# Patient Record
Sex: Female | Born: 1950
Health system: Southern US, Community
[De-identification: ages and names within clinical notes are randomized; demographics above are authoritative.]

## PROBLEM LIST (undated history)

## (undated) DIAGNOSIS — N189 Chronic kidney disease, unspecified: Secondary | ICD-10-CM

## (undated) DIAGNOSIS — G4700 Insomnia, unspecified: Secondary | ICD-10-CM

## (undated) DIAGNOSIS — I10 Essential (primary) hypertension: Secondary | ICD-10-CM

## (undated) DIAGNOSIS — G709 Myoneural disorder, unspecified: Secondary | ICD-10-CM

## (undated) DIAGNOSIS — Z8489 Family history of other specified conditions: Secondary | ICD-10-CM

## (undated) DIAGNOSIS — F419 Anxiety disorder, unspecified: Secondary | ICD-10-CM

## (undated) DIAGNOSIS — I4891 Unspecified atrial fibrillation: Secondary | ICD-10-CM

## (undated) DIAGNOSIS — IMO0001 Reserved for inherently not codable concepts without codable children: Secondary | ICD-10-CM

## (undated) DIAGNOSIS — K219 Gastro-esophageal reflux disease without esophagitis: Secondary | ICD-10-CM

## (undated) DIAGNOSIS — C50919 Malignant neoplasm of unspecified site of unspecified female breast: Secondary | ICD-10-CM

## (undated) DIAGNOSIS — E785 Hyperlipidemia, unspecified: Secondary | ICD-10-CM

## (undated) DIAGNOSIS — I499 Cardiac arrhythmia, unspecified: Secondary | ICD-10-CM

## (undated) DIAGNOSIS — R569 Unspecified convulsions: Secondary | ICD-10-CM

## (undated) DIAGNOSIS — F329 Major depressive disorder, single episode, unspecified: Secondary | ICD-10-CM

## (undated) DIAGNOSIS — Z87442 Personal history of urinary calculi: Secondary | ICD-10-CM

## (undated) DIAGNOSIS — E039 Hypothyroidism, unspecified: Secondary | ICD-10-CM

## (undated) DIAGNOSIS — I509 Heart failure, unspecified: Secondary | ICD-10-CM

## (undated) DIAGNOSIS — R51 Headache: Secondary | ICD-10-CM

## (undated) DIAGNOSIS — I2510 Atherosclerotic heart disease of native coronary artery without angina pectoris: Secondary | ICD-10-CM

## (undated) DIAGNOSIS — E538 Deficiency of other specified B group vitamins: Secondary | ICD-10-CM

## (undated) DIAGNOSIS — G2581 Restless legs syndrome: Secondary | ICD-10-CM

## (undated) DIAGNOSIS — R519 Headache, unspecified: Secondary | ICD-10-CM

## (undated) DIAGNOSIS — Z923 Personal history of irradiation: Secondary | ICD-10-CM

## (undated) DIAGNOSIS — I639 Cerebral infarction, unspecified: Secondary | ICD-10-CM

## (undated) DIAGNOSIS — M1990 Unspecified osteoarthritis, unspecified site: Secondary | ICD-10-CM

## (undated) DIAGNOSIS — F32A Depression, unspecified: Secondary | ICD-10-CM

## (undated) DIAGNOSIS — M3500 Sicca syndrome, unspecified: Secondary | ICD-10-CM

## (undated) DIAGNOSIS — J449 Chronic obstructive pulmonary disease, unspecified: Secondary | ICD-10-CM

## (undated) HISTORY — DX: Unspecified convulsions: R56.9

## (undated) HISTORY — PX: JOINT REPLACEMENT: SHX530

## (undated) HISTORY — DX: Anxiety disorder, unspecified: F41.9

## (undated) HISTORY — PX: APPENDECTOMY: SHX54

## (undated) HISTORY — PX: COLONOSCOPY: SHX174

## (undated) HISTORY — PX: BREAST LUMPECTOMY: SHX2

## (undated) HISTORY — DX: Major depressive disorder, single episode, unspecified: F32.9

## (undated) HISTORY — DX: Depression, unspecified: F32.A

## (undated) HISTORY — PX: TONSILLECTOMY: SUR1361

---

## 1995-12-05 DIAGNOSIS — E539 Vitamin B deficiency, unspecified: Secondary | ICD-10-CM | POA: Insufficient documentation

## 2010-01-30 DIAGNOSIS — Z923 Personal history of irradiation: Secondary | ICD-10-CM

## 2010-01-30 DIAGNOSIS — C50919 Malignant neoplasm of unspecified site of unspecified female breast: Secondary | ICD-10-CM

## 2010-01-30 HISTORY — PX: BREAST EXCISIONAL BIOPSY: SUR124

## 2010-01-30 HISTORY — DX: Personal history of irradiation: Z92.3

## 2010-01-30 HISTORY — DX: Malignant neoplasm of unspecified site of unspecified female breast: C50.919

## 2010-01-30 HISTORY — PX: BREAST LUMPECTOMY: SHX2

## 2010-06-29 DIAGNOSIS — IMO0002 Reserved for concepts with insufficient information to code with codable children: Secondary | ICD-10-CM | POA: Insufficient documentation

## 2010-06-29 DIAGNOSIS — E039 Hypothyroidism, unspecified: Secondary | ICD-10-CM | POA: Insufficient documentation

## 2010-09-27 DIAGNOSIS — C50911 Malignant neoplasm of unspecified site of right female breast: Secondary | ICD-10-CM

## 2010-09-27 HISTORY — DX: Malignant neoplasm of unspecified site of right female breast: C50.911

## 2010-12-14 ENCOUNTER — Ambulatory Visit: Payer: Self-pay | Admitting: Radiation Oncology

## 2010-12-21 ENCOUNTER — Ambulatory Visit: Payer: Self-pay | Admitting: Radiation Oncology

## 2010-12-31 ENCOUNTER — Ambulatory Visit: Payer: Self-pay | Admitting: Radiation Oncology

## 2011-01-31 ENCOUNTER — Ambulatory Visit: Payer: Self-pay | Admitting: Radiation Oncology

## 2011-02-02 LAB — CBC CANCER CENTER
Eosinophil %: 2.8 %
HGB: 12.8 g/dL (ref 12.0–16.0)
Lymphocyte #: 1.3 x10 3/mm (ref 1.0–3.6)
MCV: 84 fL (ref 80–100)
Monocyte %: 9 %
Neutrophil %: 66.3 %
Platelet: 278 x10 3/mm (ref 150–440)
RBC: 4.56 10*6/uL (ref 3.80–5.20)
WBC: 6 x10 3/mm (ref 3.6–11.0)

## 2011-02-09 LAB — CBC CANCER CENTER
Basophil #: 0 x10 3/mm (ref 0.0–0.1)
Eosinophil #: 0.1 x10 3/mm (ref 0.0–0.7)
HGB: 12.8 g/dL (ref 12.0–16.0)
Lymphocyte %: 16.9 %
MCH: 28.1 pg (ref 26.0–34.0)
MCV: 84 fL (ref 80–100)
Monocyte %: 7.7 %
RBC: 4.55 10*6/uL (ref 3.80–5.20)
WBC: 6.9 x10 3/mm (ref 3.6–11.0)

## 2011-03-03 ENCOUNTER — Ambulatory Visit: Payer: Self-pay | Admitting: Radiation Oncology

## 2011-05-01 ENCOUNTER — Ambulatory Visit: Payer: Self-pay | Admitting: Radiation Oncology

## 2011-05-31 ENCOUNTER — Ambulatory Visit: Payer: Self-pay | Admitting: Radiation Oncology

## 2011-10-03 ENCOUNTER — Ambulatory Visit: Payer: Self-pay | Admitting: Radiation Oncology

## 2011-10-04 ENCOUNTER — Ambulatory Visit: Payer: Self-pay | Admitting: Radiation Oncology

## 2011-10-31 ENCOUNTER — Ambulatory Visit: Payer: Self-pay | Admitting: Radiation Oncology

## 2011-12-01 ENCOUNTER — Ambulatory Visit: Payer: Self-pay | Admitting: Radiation Oncology

## 2012-01-22 ENCOUNTER — Inpatient Hospital Stay: Payer: Self-pay | Admitting: Internal Medicine

## 2012-01-22 LAB — COMPREHENSIVE METABOLIC PANEL
BUN: 12 mg/dL (ref 7–18)
Bilirubin,Total: 0.4 mg/dL (ref 0.2–1.0)
Chloride: 111 mmol/L — ABNORMAL HIGH (ref 98–107)
Co2: 23 mmol/L (ref 21–32)
EGFR (African American): >60
EGFR (Non-African Amer.): >60
Glucose: 110 mg/dL — ABNORMAL HIGH (ref 65–99)
Osmolality: 285 (ref 275–301)
Potassium: 4 mmol/L (ref 3.5–5.1)
Sodium: 143 mmol/L (ref 136–145)
Total Protein: 8.3 g/dL — ABNORMAL HIGH (ref 6.4–8.2)

## 2012-01-22 LAB — DRUG SCREEN, URINE
Amphetamines, Ur Screen: NEGATIVE (ref ?–1000)
Barbiturates, Ur Screen: NEGATIVE (ref ?–200)
Benzodiazepine, Ur Scrn: NEGATIVE (ref ?–200)
MDMA (Ecstasy)Ur Screen: NEGATIVE (ref ?–500)
Tricyclic, Ur Screen: NEGATIVE (ref ?–1000)

## 2012-01-22 LAB — CBC
HGB: 12.5 g/dL (ref 12.0–16.0)
MCH: 28.5 pg (ref 26.0–34.0)
MCHC: 33.2 g/dL (ref 32.0–36.0)
MCV: 86 fL (ref 80–100)
Platelet: 309 10*3/uL (ref 150–440)
RBC: 4.38 10*6/uL (ref 3.80–5.20)

## 2012-01-22 LAB — URINALYSIS, COMPLETE
Bacteria: NONE SEEN
Blood: NEGATIVE
Glucose,UR: NEGATIVE mg/dL (ref 0–75)
Hyaline Cast: 71
Ketone: NEGATIVE
Leukocyte Esterase: NEGATIVE
Nitrite: NEGATIVE
RBC,UR: <1 /HPF (ref 0–5)
Specific Gravity: 1.031 (ref 1.003–1.030)
Squamous Epithelial: <1
WBC UR: 1 /HPF (ref 0–5)

## 2012-01-22 LAB — ETHANOL
Ethanol %: <0.003 % (ref 0.000–0.080)
Ethanol: <3 mg/dL

## 2012-01-22 LAB — ACETAMINOPHEN LEVEL
Acetaminophen: 53 ug/mL — ABNORMAL HIGH
Acetaminophen: 52 ug/mL — ABNORMAL HIGH

## 2012-01-22 LAB — TSH: Thyroid Stimulating Horm: 2.23 u[IU]/mL

## 2012-01-24 ENCOUNTER — Inpatient Hospital Stay: Payer: Self-pay | Admitting: Psychiatry

## 2012-01-24 LAB — ACETAMINOPHEN LEVEL: Acetaminophen: <2 ug/mL

## 2012-04-02 ENCOUNTER — Ambulatory Visit: Payer: Self-pay | Admitting: Internal Medicine

## 2012-05-21 ENCOUNTER — Ambulatory Visit: Payer: Self-pay | Admitting: Internal Medicine

## 2012-07-01 ENCOUNTER — Ambulatory Visit: Payer: Self-pay | Admitting: Internal Medicine

## 2012-07-01 LAB — HEPATIC FUNCTION PANEL A (ARMC)
Albumin: 3.3 g/dL — ABNORMAL LOW (ref 3.4–5.0)
Bilirubin, Direct: 0.1 mg/dL (ref 0.00–0.20)
Bilirubin,Total: 0.3 mg/dL (ref 0.2–1.0)
SGOT(AST): 18 U/L (ref 15–37)
SGPT (ALT): 25 U/L (ref 12–78)
Total Protein: 7.7 g/dL (ref 6.4–8.2)

## 2012-07-01 LAB — CBC CANCER CENTER
Basophil #: 0.1 x10 3/mm (ref 0.0–0.1)
Basophil %: 1.1 %
HCT: 36.7 % (ref 35.0–47.0)
HGB: 12.5 g/dL (ref 12.0–16.0)
Lymphocyte #: 1.5 x10 3/mm (ref 1.0–3.6)
Lymphocyte %: 21.8 %
MCH: 29.6 pg (ref 26.0–34.0)
MCHC: 34 g/dL (ref 32.0–36.0)
MCV: 87 fL (ref 80–100)
Monocyte #: 0.4 x10 3/mm (ref 0.2–0.9)
Neutrophil %: 68 %
WBC: 6.9 x10 3/mm (ref 3.6–11.0)

## 2012-07-01 LAB — CREATININE, SERUM
EGFR (African American): >60
EGFR (Non-African Amer.): >60

## 2012-07-30 ENCOUNTER — Ambulatory Visit: Payer: Self-pay | Admitting: Internal Medicine

## 2012-12-11 ENCOUNTER — Ambulatory Visit: Payer: Self-pay | Admitting: Family Medicine

## 2012-12-31 ENCOUNTER — Ambulatory Visit: Payer: Self-pay | Admitting: Gastroenterology

## 2013-01-01 LAB — PATHOLOGY REPORT

## 2013-05-01 ENCOUNTER — Ambulatory Visit: Payer: Self-pay | Admitting: Internal Medicine

## 2013-07-30 ENCOUNTER — Ambulatory Visit: Payer: Self-pay | Admitting: Internal Medicine

## 2013-07-30 HISTORY — PX: BREAST BIOPSY: SHX20

## 2013-07-31 ENCOUNTER — Ambulatory Visit: Payer: Self-pay | Admitting: Internal Medicine

## 2013-08-06 ENCOUNTER — Ambulatory Visit: Payer: Self-pay | Admitting: Internal Medicine

## 2013-08-07 LAB — PATHOLOGY REPORT

## 2013-08-11 LAB — CREATININE, SERUM
CREATININE: 0.87 mg/dL (ref 0.60–1.30)
EGFR (African American): >60
EGFR (Non-African Amer.): >60

## 2013-08-11 LAB — CBC CANCER CENTER
BASOS ABS: 0.1 x10 3/mm (ref 0.0–0.1)
Basophil %: 0.9 %
Eosinophil #: 0.2 x10 3/mm (ref 0.0–0.7)
Eosinophil %: 2.6 %
HCT: 39.6 % (ref 35.0–47.0)
HGB: 12.8 g/dL (ref 12.0–16.0)
Lymphocyte #: 2.1 x10 3/mm (ref 1.0–3.6)
Lymphocyte %: 25.6 %
MCH: 27.8 pg (ref 26.0–34.0)
MCHC: 32.3 g/dL (ref 32.0–36.0)
MCV: 86 fL (ref 80–100)
MONO ABS: 0.6 x10 3/mm (ref 0.2–0.9)
Monocyte %: 7.5 %
NEUTROS PCT: 63.4 %
Neutrophil #: 5.1 x10 3/mm (ref 1.4–6.5)
PLATELETS: 306 x10 3/mm (ref 150–440)
RBC: 4.62 10*6/uL (ref 3.80–5.20)
RDW: 16.6 % — ABNORMAL HIGH (ref 11.5–14.5)
WBC: 8.1 x10 3/mm (ref 3.6–11.0)

## 2013-08-11 LAB — HEPATIC FUNCTION PANEL A (ARMC)
ALK PHOS: 100 U/L
Albumin: 3.5 g/dL (ref 3.4–5.0)
BILIRUBIN DIRECT: 0.1 mg/dL (ref 0.00–0.20)
Bilirubin,Total: 0.3 mg/dL (ref 0.2–1.0)
SGOT(AST): 14 U/L — ABNORMAL LOW (ref 15–37)
SGPT (ALT): 16 U/L (ref 12–78)
TOTAL PROTEIN: 8.3 g/dL — AB (ref 6.4–8.2)

## 2013-08-30 ENCOUNTER — Ambulatory Visit: Payer: Self-pay | Admitting: Internal Medicine

## 2013-11-06 DIAGNOSIS — E785 Hyperlipidemia, unspecified: Secondary | ICD-10-CM | POA: Insufficient documentation

## 2013-11-06 DIAGNOSIS — C50911 Malignant neoplasm of unspecified site of right female breast: Secondary | ICD-10-CM | POA: Insufficient documentation

## 2013-11-06 DIAGNOSIS — K219 Gastro-esophageal reflux disease without esophagitis: Secondary | ICD-10-CM | POA: Insufficient documentation

## 2013-11-06 DIAGNOSIS — I4891 Unspecified atrial fibrillation: Secondary | ICD-10-CM | POA: Insufficient documentation

## 2013-11-06 DIAGNOSIS — J449 Chronic obstructive pulmonary disease, unspecified: Secondary | ICD-10-CM | POA: Insufficient documentation

## 2013-11-06 DIAGNOSIS — C50919 Malignant neoplasm of unspecified site of unspecified female breast: Secondary | ICD-10-CM | POA: Insufficient documentation

## 2013-11-06 DIAGNOSIS — F329 Major depressive disorder, single episode, unspecified: Secondary | ICD-10-CM | POA: Insufficient documentation

## 2013-11-06 DIAGNOSIS — I38 Endocarditis, valve unspecified: Secondary | ICD-10-CM | POA: Insufficient documentation

## 2013-11-06 DIAGNOSIS — I10 Essential (primary) hypertension: Secondary | ICD-10-CM | POA: Insufficient documentation

## 2013-11-06 DIAGNOSIS — F32A Depression, unspecified: Secondary | ICD-10-CM | POA: Insufficient documentation

## 2013-11-06 DIAGNOSIS — F419 Anxiety disorder, unspecified: Secondary | ICD-10-CM | POA: Insufficient documentation

## 2014-02-16 DIAGNOSIS — D510 Vitamin B12 deficiency anemia due to intrinsic factor deficiency: Secondary | ICD-10-CM | POA: Insufficient documentation

## 2014-05-19 NOTE — H&P (Signed)
PATIENT NAME:  Tonya Walton, Tonya Walton MR#:  [id number] DATE OF BIRTH:  28-Mar-1950  DATE OF ADMISSION:  01/22/2012  PRIMARY CARE PHYSICIAN: Dr. Delight Stare.    REFERRING PHYSICIAN: Dr. Loletta Specter.   CHIEF COMPLAINT: Drug overdose and suicide attempt.   HISTORY OF THE PRESENT ILLNESS: The patient is a 64 year old Caucasian female who around 6 p.m. took a drug overdose of multiple medications in a suicidal attempt. The patient is groggy, sleepy and was difficult to get history from her but tells me that she felt lonely, unwanted, being hated by her family and she is tired of all of that. That is why she had drug overdose. I understood from she took a bunch of Neurontin each 300 mg. She stated she took the whole bottle, and she also took some lorazepam. She is unsure whether it was 12 tablets of 15 tablets. She also took half a bottle of Tylenol. Does not know exactly how many. The patient received charcoal while she is here in the Emergency Room. She was also committed to be admitted to the hospital. It appears that her husband called the ambulance, and the patient was transported here for evaluation.   REVIEW OF SYSTEMS:   CONSTITUTIONAL: Denies having any fever. No chills, but she has sleepiness and fatigue.  EYES: No blurring of vision. No double vision.  ENT: No hearing impairment. No sore throat. No dysphagia.  CARDIOVASCULAR: No chest pain. No shortness of breath. No syncope. No edema.  RESPIRATORY: No shortness of breath. No cough. No chest pain.  GASTROINTESTINAL: No abdominal pain. No vomiting. No diarrhea.  GENITOURINARY: No dysuria. No frequency of urination.  MUSCULOSKELETAL: No joint pain or swelling. No muscular pain or swelling.  INTEGUMENTARY: No skin rash. No ulcers.  NEUROLOGY: No focal weakness. No seizure activity. No headache.  PSYCHIATRY: Reports that she is depressed. No anxiety.  ENDOCRINE: No polyuria or polydipsia. No heat or cold intolerance.  HEMATOLOGY: No easy  bruisability. No lymph node enlargement.   PAST MEDICAL HISTORY: Systemic hypertension, peripheral neuropathy, hypothyroidism, breast cancer, depression.   PAST SURGICAL HISTORY: Left breast lumpectomy x2.   SOCIAL HABITS: Denies smoking. No alcohol or drug abuse.   SOCIAL HISTORY: She is married, living with her husband but she tells me that she is living with the entire family.   FAMILY HISTORY: Her father died from bone cancer. Her mother died in the hospital, but she does not give any details of how she died.   ADMISSION MEDICATIONS: Her list includes the following medications but I could not accurately confirm she is taking every one of them until she is fully awake. These include Arimidex 1 mg daily, Flexeril 10 mg twice a day, Lasix 40 mg once a day, metoprolol 50 mg once a day, Mirapex 0.25 mg once a day, montelukast 10 mg once a day, Neurontin 300 mg 4 times a day, oxybutynin 5 mg 3 times a day, Synthroid 25 mcg once a day, vitamin B12 1000 mcg once a day orally, Zyrtec 10 mg a day.   ALLERGIES: No known drug allergies.   PHYSICAL EXAMINATION:  VITAL SIGNS: Blood pressure 157/87, respiratory rate 20, pulse 89, temperature 97.4. Oxygen saturation 98%.  GENERAL APPEARANCE: This is an elderly female lying in bed in no acute distress.  HEAD AND NECK: No pallor. No icterus. No cyanosis. Ear examination revealed normal hearing. No discharge. No lesions. Nasal mucosa was normal without ulcers, no discharge. Oropharyngeal area showed the tongue and oral area are black  in color due to ingestion of charcoal. No exudates. No ulcers. Eye examination revealed normal iris and conjunctivae. Pupils about 6 mm, round, equal and reactive to light. Neck is supple. Trachea at midline. No thyromegaly. No cervical lymphadenopathy. No masses.  HEART: Normal S1, S2. No S3, S4. No murmur. No gallop. No carotid bruits.  LUNGS: Normal breathing pattern without use of accessory muscles. No rales. No wheezing.   ABDOMEN: Soft without tenderness. No hepatosplenomegaly. No masses. No hernias.  SKIN: No ulcers. No subcutaneous nodules.  MUSCULOSKELETAL: No joint swelling. No clubbing.  NEUROLOGIC: Cranial nerves II through XII were intact. No focal motor deficit. Sensation was intact.  PSYCHIATRY: The patient is alert, oriented to place and to people. She is sleepy but can communicate. She is volatile, emotionally labile, crying every now and then. Mood and affect is that of sadness.   LABORATORY FINDINGS: Her EKG showed normal sinus rhythm at rate of 95 per minute. Nonspecific T wave abnormalities. Glucose 110, BUN 12, creatinine 0.9, sodium 143, potassium 4. Alcohol level less than 3. Liver function tests and liver transaminases were normal. TSH 2.2. Urine drug screen was negative. CBC showed white count of 8000, hemoglobin 12, hematocrit 37, platelet count 309. Urinalysis was unremarkable. Acetaminophen serum level was 53. Salicylate was 2.4.    ASSESSMENT:  1. Drug overdose with Neurontin, lorazepam and some Tylenol. The amount she took is not confirmed.  2. Suicidal attempt and depression.  3. Systemic hypertension.  4. Peripheral neuropathy.  5. Hypothyroidism.  6. Breast cancer.   PLAN: Will admit the patient to the intensive care unit. She is due to for another level of acetaminophen. If the level is fine, will repeat another one in the morning in a few hours. Neuro checks and close monitoring. Monitor vital signs. Telemetry monitoring for any arrhythmias. Hemodynamically, she looks stable. I will keep the patient n.p.o. from her medications until she is fully awake and will have a better history taking of what exactly she took and how many. Psychiatry consult. Surveyor, quantity.   Time spent evaluating this patient took more than 55 minutes.   ____________________________ Clovis Pu. Lenore Manner, MD amd:gb D: 01/22/2012 23:53:29 ET T: 01/23/2012 04:26:36 ET JOB#: [id number]  cc: Clovis Pu. Lenore Manner, MD,  <Dictator> Ellin Saba MD ELECTRONICALLY SIGNED 01/23/2012 21:40

## 2014-05-19 NOTE — H&P (Signed)
PATIENT NAME:  Tonya Walton, Tonya Walton MR#:  [id number] DATE OF BIRTH:  03/16/1950  DATE OF ADMISSION:  01/24/2012  INITIAL ASSESSMENT AND PSYCHIATRIC EVALUATION  IDENTIFYING INFORMATION: The patient is a 64 year old white female, not employed, retired after working for 25 years for [employer] and doing other jobs. The patient is married for the second time and lives with her husband of 25 years in a house. The patient comes for first inpatient on psychiatry at [facility] with chief complaint of: "Last night, I took my husband's muscle relaxants and I don't know why."  HISTORY OF PRESENT ILLNESS: When the patient was asked when she last felt well, she reported that she has been doing well except that she was overwhelmed in dealing with the stressors of her family, which includes her siblings. The patient reports that she has a brother who is 61 years older than her and he is on disability and he has been disabled all his life. The patient had been taking care of him to the best of her capacity and he lives in the house next door in which her parents lived. Her siblings, who are 3 brothers and 1 sister, blamed her for not doing enough for him and this upset her and she did not know what to do and so she overdosed on her husband's muscle relaxant. She told her husband, who called ambulance, and they brought her here.  CHIEF COMPLAINT: "I will not do such a thing again. I know that for sure." The patient started laughing while talking about the same.  PAST PSYCHIATRIC HISTORY: No previous history of inpatient psychiatry. No history of suicidality. Has not been followed by a psychiatrist.  FAMILY HISTORY OF MENTAL ILLNESS: No known H/O mental illness.. No known history of suicides in the family.  FAMILY HISTORY: Raised by parents. Father died of bone cancer and he farmed. Mother died of leukemia. Has 3 brothers and 1 sister and has 1 disabled brother who is 65 years older than her and called him her baby  brother. She has 20 cats that live with her.  PERSONAL HISTORY: Born in [place]. Graduate of high school. No college.  WORK HISTORY: Longest job that she had was for [employer] and she did all sorts of things for them. Retired.  MARRIAGES: Married twice. First marriage ended because they grew apart. No children from first marriage. Married for second time for 25 years and smiles while talking about her husband. No children from second marriage. She has 20 cats.  ALCOHOL AND DRUGS: The patient does not drink alcohol. Denies street or prescription drug abuse. Did smoke nicotine cigarettes and quit many years ago.   MEDICAL HISTORY: Has high blood pressure. No known history of diabetes mellitus. Status post tonsillectomy. Status post appendectomy. Status post surgery for lumpectomy for breast cancer on the right side but did not have mastectomy done. No history of motor vehicle accident or being unconscious. Being followed by Dr. Delight Stare at [facility], last appointment some time ago, next appointment is to be made. The patient reports that she gets to see her once every 6 months except when she needs her.  CURRENT MEDICATIONS:  1.  Neurontin 300 mg 1 capsule 4 times a day. 2.  Mirapex 0.25 mg once a day. 3.  Arimidex 1 mg once a day. 4.  Synthroid 25 mcg once a day. 5.  Lasix 40 mg once a day. 6.  Montelukast 10 mg once a day. 7.  Zyrtec 10 mg 1 per day. 8.  Oxybutynin 5 mg tab 3 times a day. 9.  Metoprolol tartrate 50 mg once a day. 10.  Flexeril 10 mg twice a day. 11.  Vitamin B12, 1000 mcg once a day. 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   ALLERGIES: No known drug allergies.   PHYSICAL EXAMINATION:  VITAL SIGNS: Temperature is 98.8, pulse 94 per minute and regular, respirations 18 per minute and regular, blood pressure 140/80 mmHg. HEENT: Head is normocephalic, atraumatic.  EYES: PERLA.  Fundi bilaterally benign. EOMs intact. Tympanic membranes visualized, no exudates. NECK: Supple without any thyromegaly or lymphadenopathy. CHEST: Normal expansion. Normal breath sounds. HEART: Normal S1 without any murmurs or gallops. ABDOMEN: Soft. No organomegaly. Bowel sounds heard. RECTAL AND PELVIC: Deferred. MUSCULOSKELETAL: The patient has neuropathies in all of her body with decreased sensation in all of her body secondary to congenital B12 deficiency.  NEUROLOGIC: Gait is normal. Romberg is negative. Cranial nerves II through XII grossly intact.  DTRs 2+ and normal. Plantars are normal.  MENTAL STATUS EXAMINATION: The patient is dressed in street clothes. Alert and oriented to place, person and time. Fully aware of situation, brought here for admission to [facility]. Affect is bright and cheerful. Denies feeling depressed. Denies feeling hopeless. Also denies feeling worthless or useless. Denies suicidal or homicidal plans and states, "All I know is I will never do such a thing again" and started laughing. No psychosis. Denies auditory or visual hallucinations. Thought processes are logical and intact. Memory is intact. Cognition intact. General knowledge of information fair. Does admit to sleep disturbances because the bed is not feeling good here. Does admit to appetite disturbance because the food is not very good here and started laughing when talking about same. Insight and judgment fair.  IMPRESSION:  AXIS I: Acute stress disorder in dealing with family and conflicts with the same. Conflicts with the brothers and sisters about taking care of their disabled brother with poor impulse control. Nicotine abuse admission. AXIS II: Deferred. AXIS III: Hypertension, congenital B12 deficiency, hypothyroidism, status post appendectomy, status post tonsillectomy (remote), status post lumpectomy on the right side for breast cancer. AXIS IV: Severe. Multiple physical problems, stress of having to take  care of her disabled brother who is 59 years older than her and having conflicts with other siblings about the same. AXIS V: Global Assessment of Functioning is 25.  PLAN: The patient is admitted to [facility] for close observation and management. She will be started back on all of her medications and will be started on a low-dose antidepressant as needed. During the stay in the hospital, she will be given milieu therapy and supportive counseling where coping skills will be discussed. Social services will try to arrange for a family conference where they can have better understanding and better interpersonal relationships in taking care of their disabled brother, and a proper followup appointment will be made with the family.     ____________________________ Wallace Cullens. Franchot Mimes, MD skc:jm D: 01/25/2012 20:19:19 ET T: 01/25/2012 20:46:44 ET JOB#: [id number]  cc: Arlyn Leak K. Franchot Mimes, MD, <Dictator> Dewain Penning MD ELECTRONICALLY SIGNED 01/27/2012 18:25

## 2014-05-19 NOTE — Consult Note (Signed)
PATIENT NAME:  Tonya Walton, Tonya Walton MR#:  [id number] DATE OF BIRTH:  17-Jun-1950  DATE OF CONSULTATION:  01/23/2012  CONSULTING PHYSICIAN:  Clarnce Homan K. Keeley Sussman, MD  CHIEF COMPLAINT: The patient was seen in consultation in Room #CCU14 for poisoning by unspecific drugs and drug overdose in a suicide attempt.  SUBJECTIVE:  The patient is a 64 year old white female, not employed and is on disability for 7-1/2 years for B12 deficiency and neuropathy secondary to B12 deficiency.  The patient is married for 25 years and lives with her husband.  When the patient was asked the reason why she was in the hospital, she gave chief complaint, "I took too many of my pills."  The patient reports that she took too many of Neurontin and some muscle relaxer. According to information obtained from the staff, she has taken many tablets of Tylenol.  The patient states that, "I did it because I don't want to be here."    HISTORY OF PRESENT ILLNESS:  The patient reports that she has been feeling very depressed and because of lots of family conflicts and problems in the family and not able to deal with the same and does not want to be around anymore.    PAST PSYCHIATRIC HISTORY:  No previous history of inpatient for psychiatry.  No history of suicide attempts and not being followed by any psychiatrist.    ALCOHOL AND DRUGS:  Denied.  MENTAL STATUS EXAMINATION: The patient is drowsy, but is arousable.  She stated that today is Monday the 23rd, 2013, when it was Tuesday the 24th, 2013.  She knew that she was at [facility].  Affect is flat, mood depressed.  Does admit feeling depressed because of family conflicts and what is happening in the family and refuses to elaborate on the same.  Admits to hopeless and helpless about her situation.  Admits to feeling worthless and useless about herself.  She wants to and has wishes to hurt herself.  When asked how she would do it she said, "Any way I can, I don't know and no specific plans."  She reports  that she sees a person that is next to her and pointed out that there is a person next to her and that person talks and she cannot make out she when mentions about visual hallucinations, she stated, "I just told you about that person who is there."  Does admit memory and recall are poor and the patient is not able to focus and pay attention and gets irritable when questions are asked.  Does have suicidal wishes and thoughts and no specific plans, but she says she will do it if she has a chance to do it.  Insight and judgment guarded.  IMPRESSION:  Major depressive disorder with suicidal ideas and psychosis.  RECOMMENDATIONS:  Recommend one-on-one observation.  Start the patient on Prozac 40 mg q. a.m. and Risperdal 1 mg p.o. b.i.d.  Please transfer the patient to behavioral health when medically cleared and stable.  The patient was explained about the same.  Staff explained the same.    ____________________________ Wallace Cullens. Franchot Mimes, MD skc:ct D: 01/23/2012 14:14:58 ET T: 01/23/2012 14:38:38 ET JOB#: [id number]  cc: Arlyn Leak K. Franchot Mimes, MD, <Dictator> Dewain Penning MD ELECTRONICALLY SIGNED 01/25/2012 19:24

## 2014-05-19 NOTE — Discharge Summary (Signed)
PATIENT NAME:  Tonya Walton, Tonya Walton MR#:  [id number] DATE OF BIRTH:  1951-01-30  DATE OF ADMISSION:  01/22/2012 DATE OF DISCHARGE:  01/24/2012  PRIMARY CARE PHYSICIAN:  Dr. Delight Stare.  FINAL DISCHARGE DIAGNOSES: 1.  Suicide attempt and depression.  2.  Drug overdose.     CONDITION: Stable.   CODE STATUS: Full code.   MEDICATIONS: Please refer to the [facility] physician discharge instructions, medication reconciliation list.   DIET: Low sodium diet.   ACTIVITY: As tolerated.   FOLLOWUP CARE:  Follow up with PCP within 1 to 2 weeks. Follow up with Dr. Franchot Mimes, her medicine physician, within 1 to 2 weeks.   REASON FOR ADMISSION: Drug overdose and suicide attempt.   HOSPITAL COURSE: The patient is a 64 year old Caucasian female who has a history of hypertension, peripheral neuropathy, hypothyroidism, breast cancer, depression, was brought to the ED due to drug overdose of multiple medications in a suicide attempt.  The patient had some family conflict and took a bunch of Neurontin, Tylenol, lorazepam. For detailed history and physical examination, please refer to the admission note dictated by Dr. Lenore Manner. The patient was treated with charcoal in the ED and IV fluid support. Her Tylenol level was high, about 50 to 60. The patient was admitted to the CCU and placed on one-to-one sitter. She was followed by Dr. Franchot Mimes as she had a suicidal attempt and depression yesterday. Also, she was confused yesterday but today the patient is alert, awake, oriented, in no acute distress. The patient denies any suicide ideation or depression. The patient's Tylenol level decreased to less than 2.  Vital signs are stable. Physical examination is unremarkable. The patient is clinically stable, waiting for Dr. Franchot Mimes for clearance.  The patient is possibly discharge to home today after Dr. Nyra Market clearance. Discussed the patient's discharge plan with the patient and case manager.   TIME SPENT: About 33 minutes.        ____________________________ Demetrios Loll, MD qc:cs D: 01/24/2012 15:06:00 ET T: 01/24/2012 20:12:32 ET JOB#: [id number]  cc: Demetrios Loll, MD, <Dictator> Demetrios Loll MD ELECTRONICALLY SIGNED 01/25/2012 21:06

## 2014-05-24 DIAGNOSIS — J449 Chronic obstructive pulmonary disease, unspecified: Secondary | ICD-10-CM | POA: Insufficient documentation

## 2014-05-24 DIAGNOSIS — Z87898 Personal history of other specified conditions: Secondary | ICD-10-CM | POA: Insufficient documentation

## 2014-05-24 DIAGNOSIS — Z853 Personal history of malignant neoplasm of breast: Secondary | ICD-10-CM | POA: Insufficient documentation

## 2014-05-24 DIAGNOSIS — G2581 Restless legs syndrome: Secondary | ICD-10-CM | POA: Insufficient documentation

## 2014-05-24 DIAGNOSIS — Z8719 Personal history of other diseases of the digestive system: Secondary | ICD-10-CM | POA: Insufficient documentation

## 2014-05-24 DIAGNOSIS — Z8639 Personal history of other endocrine, nutritional and metabolic disease: Secondary | ICD-10-CM | POA: Insufficient documentation

## 2014-05-24 DIAGNOSIS — Z8679 Personal history of other diseases of the circulatory system: Secondary | ICD-10-CM | POA: Insufficient documentation

## 2014-05-24 DIAGNOSIS — E538 Deficiency of other specified B group vitamins: Secondary | ICD-10-CM | POA: Insufficient documentation

## 2014-05-27 ENCOUNTER — Inpatient Hospital Stay
Admission: RE | Admit: 2014-05-27 | Discharge: 2014-05-27 | Disposition: A | Discharge status: Home / Self Care | Payer: Self-pay | Source: Ambulatory Visit

## 2014-05-27 ENCOUNTER — Ambulatory Visit
Admit: 2014-05-27 | Disposition: A | Discharge status: Home / Self Care | Payer: Self-pay | Attending: General Practice | Admitting: General Practice

## 2014-05-27 LAB — URINALYSIS, COMPLETE
BILIRUBIN, UR: NEGATIVE
Bacteria: NONE SEEN
Blood: NEGATIVE
GLUCOSE, UR: NEGATIVE mg/dL (ref 0–75)
Ketone: NEGATIVE
Leukocyte Esterase: NEGATIVE
Nitrite: NEGATIVE
Ph: 5 (ref 4.5–8.0)
Protein: NEGATIVE
RBC,UR: NONE SEEN /HPF (ref 0–5)
SPECIFIC GRAVITY: 1.024 (ref 1.003–1.030)

## 2014-05-27 LAB — BASIC METABOLIC PANEL
ANION GAP: 9 (ref 7–16)
BUN: 26 mg/dL — ABNORMAL HIGH
Calcium, Total: 8.9 mg/dL
Chloride: 107 mmol/L
Co2: 25 mmol/L
Creatinine: 0.75 mg/dL
EGFR (African American): >60
EGFR (Non-African Amer.): >60
Glucose: 154 mg/dL — ABNORMAL HIGH
Potassium: 4.1 mmol/L
Sodium: 141 mmol/L

## 2014-05-27 LAB — APTT: Activated PTT: 28.4 secs (ref 23.6–35.9)

## 2014-05-27 LAB — HEMOGLOBIN A1C: HEMOGLOBIN A1C: 5.7 %

## 2014-05-27 LAB — SEDIMENTATION RATE: Erythrocyte Sed Rate: 39 mm/hr — ABNORMAL HIGH (ref 0–30)

## 2014-05-27 LAB — CBC
HCT: 37.8 % (ref 35.0–47.0)
HGB: 12 g/dL (ref 12.0–16.0)
MCH: 28.8 pg (ref 26.0–34.0)
MCHC: 31.8 g/dL — AB (ref 32.0–36.0)
MCV: 90 fL (ref 80–100)
Platelet: 271 10*3/uL (ref 150–440)
RBC: 4.18 10*6/uL (ref 3.80–5.20)
RDW: 14.3 % (ref 11.5–14.5)
WBC: 6.5 10*3/uL (ref 3.6–11.0)

## 2014-05-27 LAB — MRSA PCR SCREENING

## 2014-05-27 LAB — PROTIME-INR
INR: 1
PROTHROMBIN TIME: 13.3 s

## 2014-05-29 LAB — URINE CULTURE

## 2014-06-02 ENCOUNTER — Other Ambulatory Visit: Payer: Self-pay

## 2014-06-10 ENCOUNTER — Inpatient Hospital Stay: Payer: PPO | Admitting: Anesthesiology

## 2014-06-10 ENCOUNTER — Inpatient Hospital Stay
Admission: RE | Admit: 2014-06-10 | Discharge: 2014-06-13 | DRG: 470 | Disposition: A | Discharge status: Home Health | Payer: PPO | Source: Ambulatory Visit | Attending: Orthopedic Surgery | Admitting: Orthopedic Surgery

## 2014-06-10 ENCOUNTER — Encounter
Admission: RE | Disposition: A | Discharge status: Home Health | Payer: Self-pay | Source: Ambulatory Visit | Attending: Orthopedic Surgery

## 2014-06-10 ENCOUNTER — Inpatient Hospital Stay: Payer: PPO

## 2014-06-10 DIAGNOSIS — F329 Major depressive disorder, single episode, unspecified: Secondary | ICD-10-CM | POA: Diagnosis present

## 2014-06-10 DIAGNOSIS — Z853 Personal history of malignant neoplasm of breast: Secondary | ICD-10-CM | POA: Diagnosis not present

## 2014-06-10 DIAGNOSIS — I10 Essential (primary) hypertension: Secondary | ICD-10-CM | POA: Diagnosis present

## 2014-06-10 DIAGNOSIS — I4891 Unspecified atrial fibrillation: Secondary | ICD-10-CM | POA: Diagnosis present

## 2014-06-10 DIAGNOSIS — G709 Myoneural disorder, unspecified: Secondary | ICD-10-CM | POA: Diagnosis present

## 2014-06-10 DIAGNOSIS — I509 Heart failure, unspecified: Secondary | ICD-10-CM | POA: Diagnosis present

## 2014-06-10 DIAGNOSIS — Z96659 Presence of unspecified artificial knee joint: Secondary | ICD-10-CM

## 2014-06-10 DIAGNOSIS — G2581 Restless legs syndrome: Secondary | ICD-10-CM | POA: Diagnosis present

## 2014-06-10 DIAGNOSIS — K219 Gastro-esophageal reflux disease without esophagitis: Secondary | ICD-10-CM | POA: Diagnosis present

## 2014-06-10 DIAGNOSIS — J449 Chronic obstructive pulmonary disease, unspecified: Secondary | ICD-10-CM | POA: Diagnosis present

## 2014-06-10 DIAGNOSIS — F419 Anxiety disorder, unspecified: Secondary | ICD-10-CM | POA: Diagnosis present

## 2014-06-10 DIAGNOSIS — E039 Hypothyroidism, unspecified: Secondary | ICD-10-CM | POA: Diagnosis present

## 2014-06-10 DIAGNOSIS — M1711 Unilateral primary osteoarthritis, right knee: Principal | ICD-10-CM | POA: Diagnosis present

## 2014-06-10 DIAGNOSIS — Z96651 Presence of right artificial knee joint: Secondary | ICD-10-CM

## 2014-06-10 DIAGNOSIS — M25561 Pain in right knee: Secondary | ICD-10-CM | POA: Diagnosis present

## 2014-06-10 HISTORY — DX: Headache, unspecified: R51.9

## 2014-06-10 HISTORY — DX: Cardiac arrhythmia, unspecified: I49.9

## 2014-06-10 HISTORY — DX: Headache: R51

## 2014-06-10 HISTORY — DX: Heart failure, unspecified: I50.9

## 2014-06-10 HISTORY — DX: Gastro-esophageal reflux disease without esophagitis: K21.9

## 2014-06-10 HISTORY — DX: Essential (primary) hypertension: I10

## 2014-06-10 HISTORY — PX: TOTAL KNEE ARTHROPLASTY: SHX125

## 2014-06-10 HISTORY — DX: Unspecified osteoarthritis, unspecified site: M19.90

## 2014-06-10 HISTORY — DX: Chronic obstructive pulmonary disease, unspecified: J44.9

## 2014-06-10 HISTORY — DX: Hypothyroidism, unspecified: E03.9

## 2014-06-10 HISTORY — DX: Myoneural disorder, unspecified: G70.9

## 2014-06-10 LAB — BASIC METABOLIC PANEL
Anion gap: 10 (ref 5–15)
BUN: 11 mg/dL (ref 6–20)
CO2: 25 mmol/L (ref 22–32)
Calcium: 8.3 mg/dL — ABNORMAL LOW (ref 8.9–10.3)
Chloride: 104 mmol/L (ref 101–111)
Creatinine, Ser: 0.75 mg/dL (ref 0.44–1.00)
Glucose, Bld: 123 mg/dL — ABNORMAL HIGH (ref 65–99)
POTASSIUM: 4.2 mmol/L (ref 3.5–5.1)
SODIUM: 139 mmol/L (ref 135–145)

## 2014-06-10 SURGERY — ARTHROPLASTY, KNEE, TOTAL
Anesthesia: General | Laterality: Right

## 2014-06-10 MED ORDER — LIDOCAINE HCL (CARDIAC) 20 MG/ML IV SOLN
INTRAVENOUS | Status: DC | PRN
  Administered 2014-06-10: 40 mg via INTRAVENOUS

## 2014-06-10 MED ORDER — FENTANYL CITRATE (PF) 100 MCG/2ML IJ SOLN
25.0000 ug | INTRAMUSCULAR | Status: AC | PRN
  Administered 2014-06-10 (×6): 25 ug via INTRAVENOUS

## 2014-06-10 MED ORDER — ACETAMINOPHEN 10 MG/ML IV SOLN
1000.0000 mg | Freq: Four times a day (QID) | INTRAVENOUS | Status: AC
  Administered 2014-06-10 – 2014-06-11 (×3): 1000 mg via INTRAVENOUS
  Filled 2014-06-10 (×4): qty 100

## 2014-06-10 MED ORDER — ACETAMINOPHEN 325 MG PO TABS: 650.0000 mg | ORAL_TABLET | Freq: Four times a day (QID) | ORAL | Status: DC | PRN

## 2014-06-10 MED ORDER — PROPOFOL 10 MG/ML IV BOLUS: INTRAVENOUS | Status: DC | PRN

## 2014-06-10 MED ORDER — BUPIVACAINE-EPINEPHRINE (PF) 0.25% -1:200000 IJ SOLN
INTRAMUSCULAR | Status: AC
  Filled 2014-06-10: qty 30

## 2014-06-10 MED ORDER — CEFAZOLIN SODIUM-DEXTROSE 2-3 GM-% IV SOLR
2.0000 g | Freq: Four times a day (QID) | INTRAVENOUS | Status: AC
  Administered 2014-06-10 – 2014-06-11 (×3): 2 g via INTRAVENOUS
  Filled 2014-06-10 (×4): qty 50

## 2014-06-10 MED ORDER — CEFAZOLIN SODIUM-DEXTROSE 2-3 GM-% IV SOLR
INTRAVENOUS | Status: AC
  Administered 2014-06-10: 2 g via INTRAVENOUS
  Filled 2014-06-10: qty 50

## 2014-06-10 MED ORDER — FENTANYL CITRATE (PF) 100 MCG/2ML IJ SOLN
INTRAMUSCULAR | Status: AC
  Administered 2014-06-10: 25 ug via INTRAVENOUS
  Filled 2014-06-10: qty 4

## 2014-06-10 MED ORDER — ROCURONIUM BROMIDE 50 MG/5ML IV SOLN
INTRAVENOUS | Status: DC | PRN
  Administered 2014-06-10: 10 mg via INTRAVENOUS
  Administered 2014-06-10: 40 mg via INTRAVENOUS

## 2014-06-10 MED ORDER — MENTHOL 3 MG MT LOZG
1.0000 | LOZENGE | OROMUCOSAL | Status: DC | PRN
  Filled 2014-06-10: qty 9

## 2014-06-10 MED ORDER — ONDANSETRON HCL 4 MG/2ML IJ SOLN: 4.0000 mg | Freq: Once | INTRAMUSCULAR | Status: DC | PRN

## 2014-06-10 MED ORDER — MIDAZOLAM HCL 5 MG/5ML IJ SOLN
INTRAMUSCULAR | Status: DC | PRN
  Administered 2014-06-10 (×2): 1 mg via INTRAVENOUS

## 2014-06-10 MED ORDER — ONDANSETRON HCL 4 MG PO TABS: 4.0000 mg | ORAL_TABLET | Freq: Four times a day (QID) | ORAL | Status: DC | PRN

## 2014-06-10 MED ORDER — PANTOPRAZOLE SODIUM 40 MG PO TBEC
40.0000 mg | DELAYED_RELEASE_TABLET | Freq: Two times a day (BID) | ORAL | Status: DC
  Administered 2014-06-10 – 2014-06-13 (×6): 40 mg via ORAL
  Filled 2014-06-10 (×6): qty 1

## 2014-06-10 MED ORDER — FLEET ENEMA 7-19 GM/118ML RE ENEM: 1.0000 | ENEMA | Freq: Once | RECTAL | Status: AC | PRN

## 2014-06-10 MED ORDER — ALUM & MAG HYDROXIDE-SIMETH 200-200-20 MG/5ML PO SUSP: 30.0000 mL | ORAL | Status: DC | PRN

## 2014-06-10 MED ORDER — SODIUM CHLORIDE 0.9 % IV SOLN: Freq: Once | INTRAVENOUS | Status: DC

## 2014-06-10 MED ORDER — FAMOTIDINE 20 MG PO TABS
ORAL_TABLET | ORAL | Status: AC
  Administered 2014-06-10: 20 mg via ORAL
  Filled 2014-06-10: qty 1

## 2014-06-10 MED ORDER — FAMOTIDINE 20 MG PO TABS
20.0000 mg | ORAL_TABLET | Freq: Once | ORAL | Status: AC
  Administered 2014-06-10: 20 mg via ORAL

## 2014-06-10 MED ORDER — FERROUS SULFATE 325 (65 FE) MG PO TABS
325.0000 mg | ORAL_TABLET | Freq: Two times a day (BID) | ORAL | Status: DC
  Administered 2014-06-10 – 2014-06-13 (×6): 325 mg via ORAL
  Filled 2014-06-10 (×6): qty 1

## 2014-06-10 MED ORDER — NEOMYCIN-POLYMYXIN B GU 40-200000 IR SOLN
Status: DC | PRN
  Administered 2014-06-10: 14 mL

## 2014-06-10 MED ORDER — GLYCOPYRROLATE 0.2 MG/ML IJ SOLN
INTRAMUSCULAR | Status: DC | PRN
Start: 2014-06-10 — End: 2014-06-10
  Administered 2014-06-10: .5 mg via INTRAVENOUS
  Administered 2014-06-10 (×2): 0.2 mg via INTRAVENOUS

## 2014-06-10 MED ORDER — SODIUM CHLORIDE 0.9 % IV SOLN
INTRAVENOUS | Status: DC
  Administered 2014-06-10: via INTRAVENOUS

## 2014-06-10 MED ORDER — SODIUM CHLORIDE 0.9 % IV SOLN
INTRAVENOUS | Status: DC | PRN
  Administered 2014-06-10: 60 mL

## 2014-06-10 MED ORDER — PROPOFOL 10 MG/ML IV BOLUS
INTRAVENOUS | Status: DC | PRN
  Administered 2014-06-10: 200 mg via INTRAVENOUS

## 2014-06-10 MED ORDER — ONDANSETRON HCL 4 MG/2ML IJ SOLN: 4.0000 mg | Freq: Four times a day (QID) | INTRAMUSCULAR | Status: DC | PRN

## 2014-06-10 MED ORDER — FENTANYL CITRATE (PF) 100 MCG/2ML IJ SOLN
INTRAMUSCULAR | Status: DC | PRN
  Administered 2014-06-10 (×4): 50 ug via INTRAVENOUS

## 2014-06-10 MED ORDER — SENNOSIDES-DOCUSATE SODIUM 8.6-50 MG PO TABS
1.0000 | ORAL_TABLET | Freq: Two times a day (BID) | ORAL | Status: DC
  Administered 2014-06-10 – 2014-06-12 (×5): 1 via ORAL
  Filled 2014-06-10 (×5): qty 1

## 2014-06-10 MED ORDER — SODIUM CHLORIDE 0.9 % IJ SOLN
INTRAMUSCULAR | Status: AC
  Filled 2014-06-10: qty 50

## 2014-06-10 MED ORDER — BISACODYL 10 MG RE SUPP: 10.0000 mg | Freq: Every day | RECTAL | Status: DC | PRN

## 2014-06-10 MED ORDER — NEOMYCIN-POLYMYXIN B GU 40-200000 IR SOLN
Status: AC
  Filled 2014-06-10: qty 20

## 2014-06-10 MED ORDER — MORPHINE SULFATE 2 MG/ML IJ SOLN
2.0000 mg | INTRAMUSCULAR | Status: DC | PRN
  Administered 2014-06-10 (×2): 2 mg via INTRAVENOUS
  Administered 2014-06-11 (×2): 4 mg via INTRAVENOUS
  Filled 2014-06-10: qty 2
  Filled 2014-06-10 (×2): qty 1
  Filled 2014-06-10: qty 2

## 2014-06-10 MED ORDER — ONDANSETRON HCL 4 MG/2ML IJ SOLN
INTRAMUSCULAR | Status: DC | PRN
  Administered 2014-06-10: 4 mg via INTRAVENOUS

## 2014-06-10 MED ORDER — OXYCODONE HCL 5 MG PO TABS: 5.0000 mg | ORAL_TABLET | ORAL | Status: DC | PRN

## 2014-06-10 MED ORDER — ACETAMINOPHEN 10 MG/ML IV SOLN
INTRAVENOUS | Status: AC
  Administered 2014-06-10: 1000 mg via INTRAVENOUS
  Filled 2014-06-10: qty 100

## 2014-06-10 MED ORDER — METOCLOPRAMIDE HCL 10 MG PO TABS
10.0000 mg | ORAL_TABLET | Freq: Three times a day (TID) | ORAL | Status: AC
  Administered 2014-06-10 – 2014-06-12 (×8): 10 mg via ORAL
  Filled 2014-06-10 (×8): qty 1

## 2014-06-10 MED ORDER — EPHEDRINE SULFATE 50 MG/ML IJ SOLN
INTRAMUSCULAR | Status: DC | PRN
  Administered 2014-06-10 (×3): 10 mg via INTRAVENOUS

## 2014-06-10 MED ORDER — BUPIVACAINE-EPINEPHRINE (PF) 0.25% -1:200000 IJ SOLN
INTRAMUSCULAR | Status: DC | PRN
  Administered 2014-06-10: 30 mL

## 2014-06-10 MED ORDER — ENOXAPARIN SODIUM 30 MG/0.3ML ~~LOC~~ SOLN
30.0000 mg | Freq: Two times a day (BID) | SUBCUTANEOUS | Status: DC
  Administered 2014-06-10 – 2014-06-13 (×6): 30 mg via SUBCUTANEOUS
  Filled 2014-06-10 (×6): qty 0.3

## 2014-06-10 MED ORDER — MAGNESIUM HYDROXIDE 400 MG/5ML PO SUSP
30.0000 mL | Freq: Every day | ORAL | Status: DC | PRN
  Administered 2014-06-12: 30 mL via ORAL
  Filled 2014-06-10: qty 30

## 2014-06-10 MED ORDER — CELECOXIB 200 MG PO CAPS
200.0000 mg | ORAL_CAPSULE | Freq: Two times a day (BID) | ORAL | Status: DC
  Administered 2014-06-10 – 2014-06-13 (×6): 200 mg via ORAL
  Filled 2014-06-10 (×6): qty 1

## 2014-06-10 MED ORDER — LACTATED RINGERS IV SOLN
INTRAVENOUS | Status: DC
  Administered 2014-06-10: 50 mL/h via INTRAVENOUS
  Administered 2014-06-10: 11:00:00 via INTRAVENOUS

## 2014-06-10 MED ORDER — NEOSTIGMINE METHYLSULFATE 10 MG/10ML IV SOLN
INTRAVENOUS | Status: DC | PRN
  Administered 2014-06-10: 2.5 mg via INTRAVENOUS

## 2014-06-10 MED ORDER — PHENOL 1.4 % MT LIQD
1.0000 | OROMUCOSAL | Status: DC | PRN
  Filled 2014-06-10: qty 177

## 2014-06-10 MED ORDER — ACETAMINOPHEN 650 MG RE SUPP: 650.0000 mg | Freq: Four times a day (QID) | RECTAL | Status: DC | PRN

## 2014-06-10 SURGICAL SUPPLY — 63 items
AUTOTRANSFUS HAS 1/8 (MISCELLANEOUS) ×3
BATTERY INSTRU NAVIGATION (MISCELLANEOUS) ×12 IMPLANT
BLADE SAW 1 (BLADE) ×3 IMPLANT
BLADE SAW 1/2 (BLADE) ×3 IMPLANT
BONE CEMENT GENTAMICIN (Cement) ×6 IMPLANT
CANISTER SUCT 1200ML W/VALVE (MISCELLANEOUS) ×3 IMPLANT
CANISTER SUCT 3000ML (MISCELLANEOUS) ×6 IMPLANT
CAP KNEE TOTAL 3 SIGMA ×3 IMPLANT
CATH TRAY 16F METER LATEX (MISCELLANEOUS) ×3 IMPLANT
CEMENT BONE GENTAMICIN 40 (Cement) ×2 IMPLANT
COOLER POLAR GLACIER W/PUMP (MISCELLANEOUS) ×3 IMPLANT
DRAPE SHEET LG 3/4 BI-LAMINATE (DRAPES) ×3 IMPLANT
DRSG DERMACEA 8X12 NADH (GAUZE/BANDAGES/DRESSINGS) ×3 IMPLANT
DRSG OPSITE POSTOP 4X12 (GAUZE/BANDAGES/DRESSINGS) ×3 IMPLANT
DRSG OPSITE POSTOP 4X14 (GAUZE/BANDAGES/DRESSINGS) ×3 IMPLANT
DURAPREP 26ML APPLICATOR (WOUND CARE) ×6 IMPLANT
ELECT CAUTERY BLADE 6.4 (BLADE) ×3 IMPLANT
EX-PIN ORTHOLOCK NAV 4X150 (PIN) ×6 IMPLANT
GLOVE BIOGEL M STRL SZ7.5 (GLOVE) ×6 IMPLANT
GLOVE INDICATOR 8.0 STRL GRN (GLOVE) ×3 IMPLANT
GLOVE SURG 9.0 ORTHO LTXF (GLOVE) ×3 IMPLANT
GLOVE SURG ORTHO 9.0 STRL STRW (GLOVE) ×3 IMPLANT
GOWN STRL REUS W/ TWL LRG LVL3 (GOWN DISPOSABLE) ×1 IMPLANT
GOWN STRL REUS W/TWL 2XL LVL3 (GOWN DISPOSABLE) ×3 IMPLANT
GOWN STRL REUS W/TWL LRG LVL3 (GOWN DISPOSABLE) ×2
GOWN STRL REUS W/TWL XL LVL4 (GOWN DISPOSABLE) ×3 IMPLANT
HANDPIECE SUCTION TUBG SURGILV (MISCELLANEOUS) ×3 IMPLANT
HOLDER FOLEY CATH W/STRAP (MISCELLANEOUS) ×3 IMPLANT
HOOD PEEL AWAY FACE SHEILD DIS (HOOD) ×6 IMPLANT
KNIFE SCULPS 14X20 (INSTRUMENTS) ×3 IMPLANT
NDL SAFETY 18GX1.5 (NEEDLE) ×3 IMPLANT
NEEDLE SPNL 18GX3.5 QUINCKE PK (NEEDLE) ×3 IMPLANT
NEEDLE SPNL 20GX3.5 QUINCKE YW (NEEDLE) ×3 IMPLANT
NS IRRIG 500ML POUR BTL (IV SOLUTION) ×3 IMPLANT
PACK TOTAL KNEE (MISCELLANEOUS) ×3 IMPLANT
PAD CAST CTTN 4X4 STRL (SOFTGOODS) ×1 IMPLANT
PAD GROUND ADULT SPLIT (MISCELLANEOUS) ×3 IMPLANT
PAD WRAPON POLAR KNEE (MISCELLANEOUS) ×1 IMPLANT
PAD WRAPON POLOR MULTI XL (MISCELLANEOUS) ×1 IMPLANT
PADDING CAST COTTON 4X4 STRL (SOFTGOODS) ×2
PIN FIXATION 1/8DIA X 3INL (PIN) ×3 IMPLANT
SOL .9 NS 3000ML IRR  AL (IV SOLUTION) ×2
SOL .9 NS 3000ML IRR UROMATIC (IV SOLUTION) ×1 IMPLANT
SOL PREP PVP 2OZ (MISCELLANEOUS) ×3
SOLUTION PREP PVP 2OZ (MISCELLANEOUS) ×1 IMPLANT
SPONGE DRAIN TRACH 4X4 STRL 2S (GAUZE/BANDAGES/DRESSINGS) ×3 IMPLANT
STAPLER SKIN PROX 35W (STAPLE) ×3 IMPLANT
STOCKINETTE BIAS CUT 6 980064 (GAUZE/BANDAGES/DRESSINGS) ×3 IMPLANT
STRAP SAFETY BODY (MISCELLANEOUS) ×3 IMPLANT
SUCTION FRAZIER TIP 10 FR DISP (SUCTIONS) ×3 IMPLANT
SUT VIC AB 0 CT1 36 (SUTURE) ×3 IMPLANT
SUT VIC AB 1 CT1 36 (SUTURE) ×6 IMPLANT
SUT VIC AB 2-0 CT2 27 (SUTURE) ×3 IMPLANT
SYR 20CC LL (SYRINGE) ×3 IMPLANT
SYR 30ML LL (SYRINGE) ×3 IMPLANT
SYR 50ML LL SCALE MARK (SYRINGE) ×3 IMPLANT
SYSTEM AUTOTRANSFUS DUAL TROCR (MISCELLANEOUS) ×1 IMPLANT
TOWEL OR 17X26 4PK STRL BLUE (TOWEL DISPOSABLE) ×3 IMPLANT
TOWER CARTRIDGE SMART MIX (DISPOSABLE) ×3 IMPLANT
WATER STERILE IRR 1000ML POUR (IV SOLUTION) ×3 IMPLANT
WRAP-ON POLOR PAD MULTI XL (MISCELLANEOUS) ×1
WRAPON POLAR PAD KNEE (MISCELLANEOUS) ×3
WRAPON POLOR PAD MULTI XL (MISCELLANEOUS) ×2

## 2014-06-10 NOTE — Op Note (Signed)
DATE OF SURGERY:  06/10/2014  PATIENT NAME:  Tonya Walton   DOB: 02/07/1950  MRN: [id number]  PRE-OPERATIVE DIAGNOSIS: Degenerative arthrosis of the right knee, primary  POST-OPERATIVE DIAGNOSIS:  Same  PROCEDURE:  Right total knee arthroplasty using computer-assisted navigation  SURGEON:  Marciano Sequin. M.D.  ASSISTANT:  Vance Peper, PA (present and scrubbed throughout the case, critical for assistance with exposure, retraction, instrumentation, and closure)  ANESTHESIA: General  ESTIMATED BLOOD LOSS: 50 mL  FLUIDS REPLACED: 900 mL of crystalloid  TOURNIQUET TIME: 85 minutes  DRAINS: 2 medium drains to a reinfusion system  SOFT TISSUE RELEASES: Anterior cruciate ligament, posterior cruciate ligament, deep medial collateral ligament, patellofemoral ligament   IMPLANTS UTILIZED: DePuy PFC Sigma size 3 posterior stabilized femoral component (cemented), size 3 MBT tibial component (cemented), 35 mm 3 peg oval dome patella (cemented), and a 10 mm stabilized rotating platform polyethylene insert. Gentamicin bone cement was utilized.  INDICATIONS FOR SURGERY: Tonya Walton is a 64 y.o. year old female with a long history of progressive knee pain. X-rays demonstrated severe degenerative changes in tricompartmental fashion. He had not seen any significant improvement despite conservative nonsurgical intervention. After discussion of the risks and benefits of surgical intervention, the patient expressed understanding of the risks benefits and agree with plans for total knee arthroplasty.   The risks, benefits, and alternatives were discussed at length including but not limited to the risks of infection, bleeding, nerve injury, stiffness, blood clots, the need for revision surgery, cardiopulmonary complications, among others, and they were willing to proceed.  PROCEDURE IN DETAIL: The patient was brought into the operating room and, after adequate general anesthesia was achieved, a  tourniquet was placed on the patient's upper thigh. The patient's knee and leg were cleaned and prepped with alcohol and DuraPrep and draped in the usual sterile fashion. A "timeout" was performed as per usual protocol. The lower extremity was exsanguinated using an Esmarch, and the tourniquet was inflated to 300 mmHg. An anterior longitudinal incision was made followed by a standard mid vastus approach. The deep fibers of the medial collateral ligament were elevated in a subperiosteal fashion off of the medial flare of the tibia so as to maintain a continuous soft tissue sleeve. The patella was subluxed laterally and the patellofemoral ligament was incised. Inspection of the knee demonstrated severe degenerative changes with full-thickness loss of articular cartilage. Osteophytes were debrided using a rongeur. Anterior and posterior cruciate ligaments were excised. Two 4.0 mm Schanz pins were inserted in the femur and into the tibia for attachment of the array of trackers used for computer-assisted navigation. Hip center was identified using a circumduction technique. Distal landmarks were mapped using the computer. The distal femur and proximal tibia were mapped using the computer. The distal femoral cutting guide was positioned using computer-assisted navigation so as to achieve a 5 distal valgus cut. The femur was sized and it was felt that a size 3 femoral component was appropriate. A size 3 femoral cutting guide was positioned and the anterior cut was performed and verified using the computer. This was followed by completion of the posterior and chamfer cuts. Femoral cutting guide for the central box was then positioned in the center box cut was performed.  Attention was then directed to the proximal tibia. Medial and lateral menisci were excised. The extramedullary tibial cutting guide was positioned using computer-assisted navigation so as to achieve a 0 varus-valgus alignment and 0 posterior slope. The  cut was performed and  verified using the computer. The proximal tibia was sized and it was felt that a size 3 tibial tray was appropriate. Tibial and femoral trials were inserted followed by insertion of a 10 mm polyethylene insert. This allowed for excellent mediolateral soft tissue balancing both in flexion and in full extension. Finally, the patella was cut and repaired so as to accommodate a 35 mm 3 peg oval dome patella. A patella trial was placed and the knee was placed through a range of motion with excellent patellar tracking appreciated. The femoral trial was removed after debridement of posterior osteophytes. The central post-hole for the tibial component was reamed followed by insertion of a keel punch. Tibial trials were then removed. Cut surfaces of bone were irrigated with copious amounts of normal saline with antibiotic solution using pulsatile lavage and then suctioned dry. Polymethylmethacrylate cement was prepared in the usual fashion using a vacuum mixer. Cement was applied to the cut surface of the proximal tibia as well as along the undersurface of a size 3 MBT tibial component. Tibial component was positioned and impacted into place. Excess cement was removed using [employer] fast streamer. Cement was then applied to the cut surfaces of the femur as well as along the posterior flanges of the size 3 femoral component. The femoral component was positioned and impacted into place. Excess cement was removed using [employer] fast streamer. A 10 mm polyethylene trial was inserted and the knee was brought into full extension with steady axial compression applied. Finally, cement was applied to the backside of a 35 mm 3 peg oval dome patella and the patellar component was positioned and patellar clamp applied. Excess cement was removed using [employer] fast streamer. After adequate curing of the cement, the tourniquet was deflated after a total tourniquet time of 85 minutes. Hemostasis was achieved using electrocautery. The knee  was irrigated with copious amounts of normal saline with antibiotic solution using pulsatile lavage and then suctioned dry. 20 mL of 1.3% Exparel in 40 mL of normal saline was injected along the posterior capsule, medial and lateral gutters, and along the arthrotomy site. A 10 mm stabilized rotating platform polyethylene insert was inserted and the knee was placed through a range of motion with excellent mediolateral soft tissue balancing appreciated and excellent patellar tracking noted. 2 medium drains were placed in the wound bed and brought out through separate stab incisions to be attached to a reinfusion system. The medial parapatellar portion of the incision was reapproximated using interrupted sutures of #1 Vicryl. Subcutaneous tissue was then injected with a total of 30 cc of 0.25% Marcaine with epinephrine. Subcutaneous tissue was approximated in layers using first #0 Vicryl followed #2-0 Vicryl. The skin was approximated with skin staples. A sterile dressing was applied.  The patient tolerated the procedure well and was transported to the recovery room in stable condition.    James P. Holley Bouche., M.D.

## 2014-06-10 NOTE — Progress Notes (Addendum)
Rt TK with Dr. Marry Guan 06/10/14. High Fall risk. Follow protocol. Foley in place.  First autovac infusion began at 1810 with 428mL.  Pt c/o left eye pain, as though something is in it. VSS at this time.

## 2014-06-10 NOTE — Anesthesia Preprocedure Evaluation (Signed)
Anesthesia Evaluation  Patient identified by MRN, date of birth, ID band Patient awake    Reviewed: Allergy & Precautions, H&P , NPO status , Patient's Chart, lab work & pertinent test results, reviewed documented beta blocker date and time   Airway Mallampati: II  TM Distance: >3 FB Neck ROM: full    Dental no notable dental hx.    Pulmonary neg pulmonary ROS, COPDformer smoker,  breath sounds clear to auscultation  Pulmonary exam normal       Cardiovascular Exercise Tolerance: Good hypertension, +CHF negative cardio ROS  + dysrhythmias Atrial Fibrillation Rhythm:regular Rate:Normal     Neuro/Psych  Headaches, PSYCHIATRIC DISORDERS  Neuromuscular disease (bil lower extr weakness after b12 def) negative neurological ROS  negative psych ROS   GI/Hepatic negative GI ROS, Neg liver ROS, GERD-  ,  Endo/Other  negative endocrine ROSHypothyroidism   Renal/GU negative Renal ROS  negative genitourinary   Musculoskeletal   Abdominal   Peds  Hematology negative hematology ROS (+)   Anesthesia Other Findings   Reproductive/Obstetrics                             Anesthesia Physical Anesthesia Plan  ASA: III  Anesthesia Plan: General ETT   Post-op Pain Management:    Induction:   Airway Management Planned:   Additional Equipment:   Intra-op Plan:   Post-operative Plan:   Informed Consent: I have reviewed the patients History and Physical, chart, labs and discussed the procedure including the risks, benefits and alternatives for the proposed anesthesia with the patient or authorized representative who has indicated his/her understanding and acceptance.     Plan Discussed with:   Anesthesia Plan Comments:         Anesthesia Quick Evaluation

## 2014-06-10 NOTE — H&P (Signed)
The patient has been re-examined, and the chart reviewed, and there have been no interval changes to the documented history and physical.    The risks, benefits, and alternatives have been discussed at length, and the patient is willing to proceed.   

## 2014-06-10 NOTE — Brief Op Note (Signed)
06/10/2014  2:43 PM  PATIENT:  Tonya Walton  64 y.o. female  PRE-OPERATIVE DIAGNOSIS:  degenerative arthrosis right knee  POST-OPERATIVE DIAGNOSIS:  same  PROCEDURE:  Procedure(s): TOTAL KNEE ARTHROPLASTY (Right) using computer-assisted navigation  SURGEON:  Surgeon(s) and Role:    * Dereck Leep, MD - Primary  ASSISTANTS: Vance Peper, PA   ANESTHESIA:   general  EBL:  Total I/O In: 900 [I.V.:900] Out: 190 [Urine:140; Blood:50]  BLOOD ADMINISTERED:none  DRAINS: 2 to reinfusion system   LOCAL MEDICATIONS USED:  MARCAINE  Amount: 30 ml and OTHER Exparel  SPECIMEN:  No Specimen  DISPOSITION OF SPECIMEN:  N/A  COUNTS:  YES  TOURNIQUET:   Total Tourniquet Time Documented: Thigh (Right) - 85 minutes Total: Thigh (Right) - 85 minutes   DICTATION: .Viviann Spare Dictation  PLAN OF CARE: Admit to inpatient   PATIENT DISPOSITION:  PACU - hemodynamically stable.   Delay start of Pharmacological VTE agent (>24hrs) due to surgical blood loss or risk of bleeding: yes

## 2014-06-10 NOTE — Transfer of Care (Signed)
Immediate Anesthesia Transfer of Care Note  Patient: Tonya Walton  Procedure(s) Performed: Procedure(s): TOTAL KNEE ARTHROPLASTY (Right)  Patient Location: PACU  Anesthesia Type:General  Level of Consciousness: awake and sedated  Airway & Oxygen Therapy: Patient Spontanous Breathing and Patient connected to face mask oxygen  Post-op Assessment: Post -op Vital signs reviewed and stable  Post vital signs: Reviewed and stable  Last Vitals:  Filed Vitals:   06/10/14 1448  BP: 140/68  Pulse: 68  Temp: 37.1 C  Resp: 19    Complications: No apparent anesthesia complications

## 2014-06-10 NOTE — Anesthesia Procedure Notes (Signed)
Procedure Name: Intubation Date/Time: 06/10/2014 11:25 AM Performed by: Courtney Paris Pre-anesthesia Checklist: Patient identified, Emergency Drugs available, Suction available and Patient being monitored Patient Re-evaluated:Patient Re-evaluated prior to inductionOxygen Delivery Method: Circle system utilized Preoxygenation: Pre-oxygenation with 100% oxygen Intubation Type: IV induction Ventilation: Mask ventilation without difficulty Laryngoscope Size: Miller and 3 Grade View: Grade II Tube type: Oral Tube size: 7.0 mm Number of attempts: 1 Placement Confirmation: breath sounds checked- equal and bilateral,  ETT inserted through vocal cords under direct vision and positive ETCO2 Secured at: 20 cm Tube secured with: Tape Dental Injury: Teeth and Oropharynx as per pre-operative assessment

## 2014-06-11 LAB — CBC
HCT: 30.2 % — ABNORMAL LOW (ref 35.0–47.0)
Hemoglobin: 9.8 g/dL — ABNORMAL LOW (ref 12.0–16.0)
MCH: 29.2 pg (ref 26.0–34.0)
MCHC: 32.3 g/dL (ref 32.0–36.0)
MCV: 90.2 fL (ref 80.0–100.0)
Platelets: 230 10*3/uL (ref 150–440)
RBC: 3.35 MIL/uL — ABNORMAL LOW (ref 3.80–5.20)
RDW: 13.9 % (ref 11.5–14.5)
WBC: 8.4 10*3/uL (ref 3.6–11.0)

## 2014-06-11 MED ORDER — LEVOTHYROXINE SODIUM 25 MCG PO TABS
25.0000 ug | ORAL_TABLET | Freq: Every day | ORAL | Status: DC
  Administered 2014-06-11 – 2014-06-13 (×3): 25 ug via ORAL
  Filled 2014-06-11 (×3): qty 1

## 2014-06-11 MED ORDER — PRAMIPEXOLE DIHYDROCHLORIDE 0.25 MG PO TABS
0.5000 mg | ORAL_TABLET | Freq: Two times a day (BID) | ORAL | Status: DC
  Administered 2014-06-11 – 2014-06-13 (×5): 0.5 mg via ORAL
  Filled 2014-06-11 (×6): qty 2

## 2014-06-11 MED ORDER — OXYBUTYNIN CHLORIDE 5 MG PO TABS: 5.0000 mg | ORAL_TABLET | Freq: Three times a day (TID) | ORAL | Status: DC | PRN

## 2014-06-11 MED ORDER — METOPROLOL SUCCINATE ER 50 MG PO TB24
50.0000 mg | ORAL_TABLET | Freq: Every day | ORAL | Status: DC
  Administered 2014-06-11 – 2014-06-12 (×2): 50 mg via ORAL
  Filled 2014-06-11 (×2): qty 1

## 2014-06-11 MED ORDER — GABAPENTIN 300 MG PO CAPS
900.0000 mg | ORAL_CAPSULE | Freq: Three times a day (TID) | ORAL | Status: DC
  Administered 2014-06-11 – 2014-06-13 (×7): 900 mg via ORAL
  Filled 2014-06-11 (×7): qty 3

## 2014-06-11 MED ORDER — CLONAZEPAM 1 MG PO TABS
1.0000 mg | ORAL_TABLET | Freq: Every day | ORAL | Status: DC
  Administered 2014-06-11 – 2014-06-12 (×2): 1 mg via ORAL
  Filled 2014-06-11 (×2): qty 1

## 2014-06-11 MED ORDER — ANASTROZOLE 1 MG PO TABS
1.0000 mg | ORAL_TABLET | Freq: Every day | ORAL | Status: DC
Start: 2014-06-11 — End: 2014-06-13
  Administered 2014-06-11 – 2014-06-12 (×2): 1 mg via ORAL
  Filled 2014-06-11 (×3): qty 1

## 2014-06-11 MED ORDER — TRAMADOL HCL 50 MG PO TABS
50.0000 mg | ORAL_TABLET | ORAL | Status: DC | PRN
  Administered 2014-06-11 – 2014-06-12 (×3): 50 mg via ORAL
  Administered 2014-06-12 – 2014-06-13 (×3): 100 mg via ORAL
  Filled 2014-06-11: qty 1
  Filled 2014-06-11: qty 2
  Filled 2014-06-11: qty 1
  Filled 2014-06-11: qty 2
  Filled 2014-06-11: qty 1
  Filled 2014-06-11: qty 2

## 2014-06-11 MED ORDER — FUROSEMIDE 40 MG PO TABS
40.0000 mg | ORAL_TABLET | Freq: Every day | ORAL | Status: DC
  Administered 2014-06-12: 40 mg via ORAL
  Filled 2014-06-11 (×2): qty 1

## 2014-06-11 MED ORDER — FLUOXETINE HCL 20 MG PO CAPS
40.0000 mg | ORAL_CAPSULE | Freq: Every day | ORAL | Status: DC
  Administered 2014-06-11 – 2014-06-13 (×4): 40 mg via ORAL
  Filled 2014-06-11 (×3): qty 2

## 2014-06-11 MED ORDER — PRAMIPEXOLE DIHYDROCHLORIDE 0.25 MG PO TABS: 0.2500 mg | ORAL_TABLET | Freq: Three times a day (TID) | ORAL | Status: DC

## 2014-06-11 MED ORDER — PRAMIPEXOLE DIHYDROCHLORIDE 0.25 MG PO TABS
0.2500 mg | ORAL_TABLET | Freq: Every day | ORAL | Status: DC
  Administered 2014-06-12 – 2014-06-13 (×2): 0.25 mg via ORAL
  Filled 2014-06-11 (×3): qty 1

## 2014-06-11 NOTE — Care Management (Signed)
Lovenox $4.00. [facility] has accepted this patient for home health.

## 2014-06-11 NOTE — Progress Notes (Signed)
RD Assessment  Admitted with:  Total knee replacement PMHx: CHF, HTN, COPD, Cancer  Current Diet: Regular Typical Food/ Fluid Intake: 100% recorded per I/O Meal/ Snack Patterns: pt reports eating a regular diet with low salt restrictions PTA. Reports a good appetite and intake. Denies difficulty chewing/ swallowing.  Supplements: None  Food Allergies: NKFA Food Preferences: Reviewed  Ht: 66" Current weight: 242# BMI: 39.2 Weight Changes: Pt reports a UBW of 250#, last known x 1 year ago. Current weight is an 8# weight loss x 1 year- not significant  UOP: Reviewed  Digestive: Reviewed  Gastrointestinal: Reviewed Skin: Reviewed Physical Findings: n/a  Labs: Electrolyte and Renal Profile:    Recent Labs Lab 06/10/14 1727  BUN 11  CREATININE 0.75  NA 139  K 4.2    Meds: Lasix, NS @ 100/hr  PES Statement: No nutrition concerns at this time.  Intervention: Meals and Snacks: Cater to patient preferences  Monitoring/ Evaluation: Energy Intake: goal for patient to meet >90% of estimated needs.  Roda Shutters, RDN Pager: [phone] Office: [place]

## 2014-06-11 NOTE — Care Management Note (Signed)
Case Management Note  Patient Details  Name: Tonya Walton MRN: [id number] Date of Birth: 1950-06-08  Subjective/Objective:                   Patient resting comfortably in bed. She would like to return home with her husband at discharge. She has a rolling walker that belongs to her that is present in this room. Her physical address is [place] [address] phone [phone]. She uses [facility]. She would like to use [facility]. She uses [facility] for Rx [phone].  Action/Plan: Referral sent to Tim with [employer] via email with patient physical address. [facility] 40mg  [facility]. RNCM will continue to follow.    Expected Discharge Date:  06/13/14               Expected Discharge Plan:     In-House Referral:  Clinical Social Work  Discharge planning Services  CM Consult  Post Acute Care Choice:    Choice offered to:  Patient  DME Arranged:  N/A DME Agency:  NA  HH Arranged:  PT HH Agency:  [facility]  Status of Service:     Medicare Important Message Given:  Yes Date Medicare IM Given:  06/11/14 Medicare IM give by:  Marshell Garfinkel Date Additional Medicare IM Given:    Additional Medicare Important Message give by:     If discussed at [place] of Stay Meetings, dates discussed:    Additional Comments:  Marshell Garfinkel, RN 06/11/2014, 10:57 AM

## 2014-06-11 NOTE — Evaluation (Signed)
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 Pt presents on POD1 with R TKA.   Clinical Impression  Pt progressing well, with low pain rating at start and throughout (3/10). Pt ROM is progressing into extension, but R knee flexion is more limited due to pain. Pt is weak in RLE but tolerates activity well. Patient presents with impairment of strength, pain, range of motion, and activity tolerance, limiting ability to perform ADL, IADL, and ambulation. Patient will benefit from skilled intervention to address the above impairments and limitations, in order to restore to prior level of function and to decrease caregiver burden.       Follow Up Recommendations Home health PT    Equipment Recommendations  Rolling walker with 5" wheels    Recommendations for Other Services       Precautions / Restrictions Precautions Precautions: None Restrictions Weight Bearing Restrictions: Yes RLE Weight Bearing: Weight bearing as tolerated      Mobility  Bed Mobility Overal bed mobility: Needs Assistance Bed Mobility: Supine to Sit     Supine to sit: Supervision     General bed mobility comments: Verbal cues and safety awareness.   Transfers Overall transfer level: Needs assistance Equipment used: Rolling walker (2 wheeled) Transfers: Sit to/from Stand Sit to Stand: Min guard         General transfer comment: Verbal cues and safety awareness.   Ambulation/Gait Ambulation/Gait assistance: Min guard Ambulation Distance (Feet): 8 Feet Assistive device: Rolling walker (2 wheeled) (Verbal cues and safety awareness. )     Gait velocity interpretation: <1.8 ft/sec, indicative of risk for recurrent falls General Gait Details: Antalgic gait, decreased step length bilat.   Stairs            Wheelchair Mobility    Modified Rankin (Stroke Patients Only)       Balance Overall balance assessment: Modified Independent;No apparent balance deficits (not formally assessed)                                           Pertinent Vitals/Pain Pain Assessment: 0-10 Pain Score: 3  Pain Location: R Knee Pain Descriptors / Indicators: Aching Pain Intervention(s): Monitored during session;Ice applied    Home Living Family/patient expects to be discharged to:: Private residence Living Arrangements: Spouse/significant other (Husband has Cardiac history and COPD but able to assist with cooking, bathing, and driving to appts. ) Available Help at Discharge: Family Type of Home: House Home Access: Stairs to enter   [employer] of Steps: 1 Home Layout: One level Home Equipment: Cane - single point;Toilet riser;Walker - 2 wheels      Prior Function Level of Independence: Independent               Hand Dominance        Extremity/Trunk Assessment   Upper Extremity Assessment: Overall WFL for tasks assessed           Lower Extremity Assessment: Overall WFL for tasks assessed;Generalized weakness      Cervical / Trunk Assessment: Normal  Communication   Communication: No difficulties  Cognition Arousal/Alertness: Awake/alert Behavior During Therapy: WFL for tasks assessed/performed Overall Cognitive Status: Within Functional Limits for tasks assessed  General Comments      Exercises Total Joint Exercises Ankle Circles/Pumps: AROM;Both;15 reps;Seated Quad Sets: AROM;Seated;Both;10 reps Heel Slides: PROM;Seated;Right;5 reps (Limited and guarding due to pain. ) Hip ABduction/ADduction: AAROM;Seated;Right;10 reps Straight Leg Raises: AAROM;Seated;Right;10 reps Goniometric ROM: 5-68 degrees Right knee flexion      Assessment/Plan    PT Assessment Patient needs continued PT services  PT  Diagnosis Difficulty walking;Abnormality of gait;Generalized weakness;Acute pain   PT Problem List Decreased strength;Decreased range of motion;Decreased activity tolerance;Decreased balance;Decreased mobility;Decreased knowledge of use of DME;Decreased safety awareness  PT Treatment Interventions DME instruction;Gait training;Balance training;Stair training;Functional mobility training;Therapeutic activities;Therapeutic exercise;Patient/family education   PT Goals (Current goals can be found in the Care Plan section) Acute Rehab PT Goals Patient Stated Goal: Return to home s/p DC.  PT Goal Formulation: With patient Time For Goal Achievement: 06/25/14 Potential to Achieve Goals: Good    Frequency BID   Barriers to discharge        Co-evaluation               End of Session Equipment Utilized During Treatment: Gait belt Activity Tolerance: Patient tolerated treatment well;Patient limited by pain Patient left: in chair;with call bell/phone within reach;with chair alarm set Nurse Communication: Mobility status;Other (comment)         Time: 9476-5465 PT Time Calculation (min) (ACUTE ONLY): 31 min   Charges:   PT Evaluation $Initial PT Evaluation Tier I: 1 Procedure PT Treatments $Therapeutic Exercise: 23-37 mins   PT G Codes:        Kesia Dalto C 07-09-2014, 10:43 AM  Etta Grandchild, PT, DPT, BM

## 2014-06-11 NOTE — Progress Notes (Signed)
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 Pt presents on POD1 with R TKA.     PT Comments    Pt progressing well as evidenced by improved ambulation tolerance and distance. Pt still guarding and self limiting flexion ROM due to pain. Patient presents with impairment of strength, pain, range of motion, and activity tolerance, limiting ability to perform ADL, IADL, and ambulation. Patient will benefit from skilled intervention to address the above impairments and limitations, in order to restore to prior level of function and to decrease caregiver burden.   Follow Up Recommendations  Home health PT     Equipment Recommendations  Rolling walker with 5" wheels    Recommendations for Other Services       Precautions / Restrictions Precautions Precautions: Fall Restrictions Weight Bearing Restrictions: Yes RLE Weight Bearing: Weight bearing as tolerated    Mobility  Bed Mobility Overal bed mobility: Needs Assistance Bed Mobility: Supine to Sit     Supine to sit: Supervision     General bed mobility comments: Verbal cues and safety awareness.   Transfers Overall transfer level: Needs assistance Equipment used: Rolling walker (2 wheeled) Transfers: Sit to/from Stand Sit to Stand: Min guard         General transfer comment: Verbal cues and safety awareness.   Ambulation/Gait Ambulation/Gait assistance: Min guard Ambulation Distance (Feet): 60 Feet Assistive device: Rolling walker (2 wheeled)   Gait velocity: .55m/s Gait velocity interpretation: <1.8 ft/sec, indicative of risk for recurrent falls General Gait Details: Antalgic gait, decreased step length bilat.    Stairs            Wheelchair Mobility    Modified Rankin (Stroke Patients Only)        Balance Overall balance assessment: No apparent balance deficits (not formally assessed);Modified Independent                                  Cognition Arousal/Alertness: Awake/alert Behavior During Therapy: WFL for tasks assessed/performed Overall Cognitive Status: Within Functional Limits for tasks assessed                      Exercises Total Joint Exercises Ankle Circles/Pumps: AROM;Both;15 reps;Seated Quad Sets: AROM;Seated;Both;10 reps Heel Slides: PROM;Seated;Right;5 reps (Limited and guarding due to pain. ) Hip ABduction/ADduction: AAROM;Seated;Right;10 reps Straight Leg Raises: AAROM;Seated;Right;10 reps Long Arc Quad: AAROM;Seated;10 reps;Right Goniometric ROM: Not assessed this session. Still heavily guarding into flexion ROM.     General Comments        Pertinent Vitals/Pain Pain Assessment: 0-10 (Simultaneous filing. User may not have seen previous data.) Pain Score: 3  (Simultaneous filing. User may not have seen previous data.) Pain Location: R knee (Simultaneous filing. User may not have seen previous data.) Pain Descriptors / Indicators: Aching (Simultaneous filing. User may not have seen previous data.) Pain Intervention(s): Monitored during session;Premedicated before session;Ice applied (Simultaneous filing. User may not have seen previous data.)    Home Living Family/patient expects to be discharged to:: Private residence Living Arrangements: Spouse/significant other Available Help at Discharge: Family Type of Home: House Home Access: Stairs to enter   Home Layout: One [place]: [place] - single point;Walker - 2 wheels;Bedside commode  Prior Function Level of Independence: Independent          PT Goals (current goals can now be found in the care plan section) Acute Rehab PT Goals Patient Stated Goal: Return to home s/p DC.  PT Goal Formulation: With patient Time For Goal Achievement: 06/25/14 Potential to  Achieve Goals: Good    Frequency  BID    PT Plan      Co-evaluation             End of Session Equipment Utilized During Treatment: Gait belt Activity Tolerance: Patient tolerated treatment well;Patient limited by pain Patient left: with call bell/phone within reach;in bed;with bed alarm set;with nursing/sitter in room     Time: 1235-1300 PT Time Calculation (min) (ACUTE ONLY): 25 min  Charges:  $Therapeutic Exercise: 23-37 mins $Therapeutic Activity: 8-22 mins                    G Codes:      Buccola,Allan C 2014/07/07, 1:37 PM Etta Grandchild, PT, DPT, BM

## 2014-06-11 NOTE — Progress Notes (Signed)
   06/11/14 1030  Clinical Encounter Type  Visited With Patient  Visit Type Initial;Spiritual support;Other (Comment)  Referral From Nurse  Consult/Referral To Chaplain  Spiritual Encounters  Spiritual Needs Literature  Stress Factors  Patient Stress Factors None identified  Advance Directives (For Healthcare)  Does patient have an advance directive? No  Would patient like information on creating an advanced directive? Yes - Scientist, clinical (histocompatibility and immunogenetics) given  Chaplain received order and went to visit with patient. Patient does not have a HCPOA and wanted information. Did education overview and gave packet for her and spouse as requested. Chaplain Jazman Reuter A. Adrian Specht Ext. [phone]

## 2014-06-11 NOTE — Progress Notes (Addendum)
  Subjective: 1 Day Post-Op Procedure(s) (LRB): TOTAL KNEE ARTHROPLASTY (Right) Patient reports pain as mild.   Patient seen in rounds with Dr. Marry Guan. Patient is well, and has had no acute complaints or problems.  Patient slept well without much pain. Plan is to go Home after hospital stay. Negative for chest pain and shortness of breath Fever: no Gastrointestinal:negative for nausea and vomiting  Objective: Vital signs in last 24 hours: Temp:  [98.2 F (36.8 C)-99.7 F (37.6 C)] 98.3 F (36.8 C) (05/12 0359) Pulse Rate:  [36-77] 72 (05/12 0359) Resp:  [16-24] 18 (05/12 0359) BP: (117-164)/(49-81) 117/51 mmHg (05/12 0359) SpO2:  [90 %-100 %] 92 % (05/12 0359) Weight:  [104.327 kg (230 lb)-109.9 kg (242 lb 4.6 oz)] 109.9 kg (242 lb 4.6 oz) (05/11 1640)  Intake/Output from previous day:  Intake/Output Summary (Last 24 hours) at 06/11/14 0641 Last data filed at 06/11/14 0515  Gross per 24 hour  Intake 2182.33 ml  Output   2510 ml  Net -327.67 ml    Intake/Output this shift: Total I/O In: 718.3 [I.V.:568.3; IV Piggyback:150] Out: 2175 [Urine:2100; Drains:75]  Labs:  Recent Labs  06/11/14 0516  HGB 9.8*    Recent Labs  06/11/14 0516  WBC 8.4  RBC 3.35*  HCT 30.2*  PLT 230    Recent Labs  06/10/14 1727  NA 139  K 4.2  CL 104  CO2 25  BUN 11  CREATININE 0.75  GLUCOSE 123*  CALCIUM 8.3*   No results for input(s): LABPT, INR in the last 72 hours.   EXAM General - Patient is Alert and Oriented Extremity - Neurovascular intact Sensation intact distally Dorsiflexion/Plantar flexion intact Able to SLR with very light assistance. Dressing/Incision - clean, dry Motor Function - intact, moving foot and toes well on exam.    Past Medical History  Diagnosis Date  . Anxiety   . Depression   . Dysrhythmia     Afib  . CHF (congestive heart failure)   . Hypertension   . COPD (chronic obstructive pulmonary disease)   . Hypothyroidism   . GERD  (gastroesophageal reflux disease)   . Headache   . 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 Arthritis   . Cancer     right breast    Assessment/Plan: 1 Day Post-Op Procedure(s) (LRB): TOTAL KNEE ARTHROPLASTY (Right) Active Problems:   Total knee replacement status  Estimated body mass index is 39.12 kg/(m^2) as calculated from the following:   Height as of this encounter: 5\' 6"  (1.676 m).   Weight as of this encounter: 109.9 kg (242 lb 4.6 oz). Advance diet Up with therapy  Plan to go home Sat/Sun  DVT Prophylaxis - Lovenox, Foot Pumps and TED hose Weight-Bearing as tolerated to Right leg  Reche Dixon, PA-C Orthopaedic Surgery 06/11/2014, 6:41 AM

## 2014-06-11 NOTE — Evaluation (Deleted)
Physical Therapy Evaluation Patient Details Name: ANGELL PINCOCK MRN: [id number] DOB: 12-20-1950 Today's Date: 06/11/2014   History of Present Illness  Pt is a 64yo female with history of BLE neuropathy from B12 deficicency, and DJD in R knee. Pt presents on POD1 with R TKA.   Clinical Impression  Pt making progress in active RLE strength and indep in transfers and mobility, able to cite several recommendation from this morning. Pt is motivated to perform well, but still has pain inhibition with knee flexion, with heavy guarding. Pt mildly shaky in BUE during RW use in AMB, but reports is feeling fine. Patient presents with impairment of strength, pain, range of motion, and activity tolerance, limiting ability to perform ADL, IADL, and ambulation. Patient will benefit from skilled intervention to address the above impairments and limitations, in order to restore to prior level of function and to decrease caregiver burden.      Follow Up Recommendations Home health PT    Equipment Recommendations  Rolling walker with 5" wheels    Recommendations for Other Services       Precautions / Restrictions Precautions Precautions: Fall Restrictions Weight Bearing Restrictions: Yes RLE Weight Bearing: Weight bearing as tolerated      Mobility  Bed Mobility Overal bed mobility: Needs Assistance Bed Mobility: Supine to Sit     Supine to sit: Supervision     General bed mobility comments: Verbal cues and safety awareness.   Transfers Overall transfer level: Needs assistance Equipment used: Rolling walker (2 wheeled) Transfers: Sit to/from Stand Sit to Stand: Min guard         General transfer comment: Verbal cues and safety awareness.   Ambulation/Gait Ambulation/Gait assistance: Min guard Ambulation Distance (Feet): 60 Feet Assistive device: Rolling walker (2 wheeled)   Gait velocity: .58m/s Gait velocity interpretation: <1.8 ft/sec, indicative of risk for recurrent  falls General Gait Details: Antalgic gait, decreased step length bilat.   Stairs            Wheelchair Mobility    Modified Rankin (Stroke Patients Only)       Balance Overall balance assessment: No apparent balance deficits (not formally assessed);Modified Independent                                           Pertinent Vitals/Pain Pain Assessment: 0-10 Pain Score: 3  Pain Location: r KNEE  Pain Descriptors / Indicators: Aching Pain Intervention(s): Monitored during session;Ice applied    Home Living Family/patient expects to be discharged to:: Private residence Living Arrangements: Spouse/significant other Available Help at Discharge: Family Type of Home: House Home Access: Stairs to enter   Technical brewer of Steps: 1 Home Layout: One level Home Equipment: Leon - single point;Toilet riser;Walker - 2 wheels      Prior Function Level of Independence: Independent               Hand Dominance        Extremity/Trunk Assessment   Upper Extremity Assessment: Overall WFL for tasks assessed           Lower Extremity Assessment: Overall WFL for tasks assessed      Cervical / Trunk Assessment: Normal  Communication   Communication: No difficulties  Cognition Arousal/Alertness: Awake/alert Behavior During Therapy: WFL for tasks assessed/performed Overall Cognitive Status: Within Functional Limits for tasks assessed  General Comments      Exercises Total Joint Exercises Ankle Circles/Pumps: AROM;Both;15 reps;Seated Quad Sets: AROM;Seated;Both;10 reps Heel Slides: PROM;Seated;Right;5 reps (Limited and guarding due to pain. ) Hip ABduction/ADduction: AAROM;Seated;Right;10 reps Straight Leg Raises: AAROM;Seated;Right;10 reps Long Arc Quad: AAROM;Seated;10 reps;Right Goniometric ROM: Not assess this session. Still heavily guarding into flexion.       Assessment/Plan    PT Assessment  Patient needs continued PT services  PT Diagnosis Difficulty walking;Abnormality of gait;Generalized weakness;Acute pain   PT Problem List Decreased strength;Decreased range of motion;Decreased activity tolerance;Decreased balance;Decreased mobility;Decreased knowledge of use of DME;Decreased safety awareness  PT Treatment Interventions DME instruction;Gait training;Balance training;Stair training;Functional mobility training;Therapeutic activities;Therapeutic exercise;Patient/family education   PT Goals (Current goals can be found in the Care Plan section) Acute Rehab PT Goals Patient Stated Goal: Return to home s/p DC.  PT Goal Formulation: With patient Time For Goal Achievement: 06/25/14 Potential to Achieve Goals: Good    Frequency BID   Barriers to discharge        Co-evaluation               End of Session Equipment Utilized During Treatment: Gait belt Activity Tolerance: Patient tolerated treatment well;Patient limited by pain Patient left: with call bell/phone within reach;in bed;with bed alarm set;with nursing/sitter in room Nurse Communication: Mobility status;Other (comment)         Time: 1235-1300 PT Time Calculation (min) (ACUTE ONLY): 25 min   Charges:   PT Evaluation $Initial PT Evaluation Tier I: 1 Procedure PT Treatments $Therapeutic Exercise: 23-37 mins $Therapeutic Activity: 8-22 mins   PT G Codes:        Oline Belk C 2014/06/25, 1:07 PM  Etta Grandchild, PT, DPT, BM

## 2014-06-11 NOTE — Progress Notes (Signed)
Clinical Social Worker (CSW) received SNF consult. PT is recommending home health. RN Case Manager aware of above. Please reconsult if future social work needs arise. CSW signing off.   Dianne Whelchel Morgan, LCSWA [phone] 

## 2014-06-11 NOTE — Evaluation (Signed)
Occupational Therapy Evaluation Patient Details Name: Tonya Walton MRN: [id number] DOB: 08-03-1950 Today's Date: 06/11/2014    History of Present Illness Pt is a 64yo female with history of BLE neuropathy from B12 deficicency, and DJD in R knee. Pt presents on POD1 with R TKA.    Clinical Impression   Pt is 64 year old female s/p R TKR.  Pt was independent in all ADLs prior to surgery and is eager to return to PLOF.  Pt currently requires mimimal assist for LB dressing while in seated position due to pain and limited AROM of R knee.  Pt would benefit from instruction in dressing techniques with or without assistive devices for dressing and bathing skills.  Pt would also benefit from recommendations for home modifications to increase safety in the bathroom and prevent falls. Will assess for OT [facility] needs as pt progresses in therapy.      Follow Up Recommendations  No OT follow up    Equipment Recommendations       Recommendations for Other Services PT consult     Precautions / Restrictions Precautions Precautions: Fall Restrictions Weight Bearing Restrictions: Yes RLE Weight Bearing: Weight bearing as tolerated      Mobility Bed Mobility Transfers         General transfer comment: Verbal cues and safety awareness.     Balance                                          ADL Overall ADL's : Needs assistance/impaired Eating/Feeding: Independent   Grooming: Wash/dry hands;Wash/dry face;Oral care;Applying deodorant;Brushing hair;Independent   Upper Body Bathing: Independent;Set up   Lower Body Bathing: Minimal assistance   Upper Body Dressing : Independent;Set up   Lower Body Dressing: Minimal assistance;With adaptive equipment Lower Body Dressing Details (indicate cue type and reason): reacher and sock aid                     Vision     Perception     Praxis      Pertinent Vitals/Pain Pain Assessment: 0-10 (Simultaneous filing.  User may not have seen previous data.) Pain Score: 3  (Simultaneous filing. User may not have seen previous data.) Pain Location: R knee (Simultaneous filing. User may not have seen previous data.) Pain Descriptors / Indicators: Aching (Simultaneous filing. User may not have seen previous data.) Pain Intervention(s): Monitored during session;Premedicated before session;Ice applied (Simultaneous filing. User may not have seen previous data.)     Hand Dominance Right   Extremity/Trunk Assessment Upper Extremity Assessment Upper Extremity Assessment: Overall WFL for tasks assessed   Lower Extremity Assessment Lower Extremity Assessment: Defer to PT evaluation   Cervical / Trunk Assessment Cervical / Trunk Assessment: Normal   Communication Communication Communication: No difficulties   Cognition Arousal/Alertness: Awake/alert Behavior During Therapy: WFL for tasks assessed/performed Overall Cognitive Status: Within Functional Limits for tasks assessed                     General Comments       Exercises       Shoulder Instructions      Home Living Family/patient expects to be discharged to:: Private residence Living Arrangements: Spouse/significant other Available Help at Discharge: Family Type of Home: House Home Access: Stairs to enter [employer] of Steps: 1   Home Layout: One level  Bathroom Shower/Tub: Risk analyst characteristics: Architectural technologist: Standard (has 3 in 1 commode over toilet) Bathroom Accessibility: Yes How Accessible: Accessible via walker Home Equipment: [place] - single point;Walker - 2 wheels;Bedside commode          Prior Functioning/Environment Level of Independence: Independent             OT Diagnosis: Generalized weakness;Acute pain   OT Problem List: Decreased strength;Pain;Decreased activity tolerance   OT Treatment/Interventions: Self-care/ADL training;DME and/or AE  instruction;Patient/family education    OT Goals(Current goals can be found in the care plan section) Acute Rehab OT Goals Patient Stated Goal: To go home OT Goal Formulation: With patient Time For Goal Achievement: 06/25/14 Potential to Achieve Goals: Good  OT Frequency: Min 2X/week   Barriers to D/C:            Co-evaluation              End of Session Equipment Utilized During Treatment:  (reacher and sock aid)  Activity Tolerance: Patient tolerated treatment well;Patient limited by pain Patient left: in chair;with call bell/phone within reach;with chair alarm set;with SCD's reapplied   Time: 1035-1100 OT Time Calculation (min): 25 min Charges:  OT General Charges $OT Visit: 1 Procedure OT Evaluation $Initial OT Evaluation Tier I: 1 Procedure OT Treatments $Self Care/Home Management : 8-22 mins G-Codes:    Wofford,Susan 07/07/2014, 1:43 PM   Chrys Racer, OTR/L

## 2014-06-11 NOTE — Progress Notes (Signed)
POD 1, right total knee. Dsg dry/intact. Foley cath in place. Hemovac in place. Pain controlled well w/ IV PRN meds. Pt slept during most of evening. No acute distress noted, will cont to monitor.

## 2014-06-12 LAB — CBC
HCT: 28.8 % — ABNORMAL LOW (ref 35.0–47.0)
Hemoglobin: 9.4 g/dL — ABNORMAL LOW (ref 12.0–16.0)
MCH: 29.4 pg (ref 26.0–34.0)
MCHC: 32.5 g/dL (ref 32.0–36.0)
MCV: 90.2 fL (ref 80.0–100.0)
Platelets: 199 10*3/uL (ref 150–440)
RBC: 3.2 MIL/uL — AB (ref 3.80–5.20)
RDW: 14.4 % (ref 11.5–14.5)
WBC: 8.5 10*3/uL (ref 3.6–11.0)

## 2014-06-12 NOTE — Progress Notes (Signed)
Patient a&o, vss. Pain controlled. Dressing dry and intact. Up to chair today, up to bathroom one assist. Still needs post op BM. Continue to monitor.

## 2014-06-12 NOTE — Progress Notes (Signed)
Physical Therapy Treatment Patient Details Name: NOU CHARD MRN: [id number] DOB: [birth year] Today's Date: 06/12/2014    History of Present Illness Pt is a 64yo female with history of BLE neuropathy from B12 deficicency, and DJD in R knee. Pt presents on POD1 with R TKA.     PT Comments    Pt continues to show great progress toward goals with less dependence on BUE and AD during ambulation. Pt successfully completed four steps today with husband in attendance for education. Pt agreeable to attempt all HEP indep, with eventual review prior to DC. Patient presents with impairment of strength, pain, range of motion, and activity tolerance, limiting ability to perform ADL, IADL, and ambulation. Patient will benefit from skilled intervention to address the above impairments and limitations, in order to restore to prior level of function and to decrease caregiver burden.    Follow Up Recommendations  Home health PT     Equipment Recommendations  Rolling walker with 5" wheels    Recommendations for Other Services       Precautions / Restrictions Precautions Precautions: Fall Restrictions Weight Bearing Restrictions: Yes RLE Weight Bearing: Weight bearing as tolerated    Mobility  Bed Mobility Overal bed mobility: Modified Independent Bed Mobility: Supine to Sit     Supine to sit: Modified independent (Device/Increase time)        Transfers Overall transfer level: Needs assistance Equipment used: Rolling walker (2 wheeled) Transfers: Sit to/from Stand Sit to Stand: Supervision (verbal safety cues for AD )            Ambulation/Gait Ambulation/Gait assistance: Supervision Ambulation Distance (Feet): 100 Feet Assistive device: Rolling walker (2 wheeled) Gait Pattern/deviations: Step-through pattern Gait velocity: .57m/s Gait velocity interpretation: <1.8 ft/sec, indicative of risk for recurrent falls General Gait Details: Gait near symmetrical, given cues for heel  strike   Stairs Stairs: Yes Stairs assistance: Min guard Stair Management: No rails;Step to pattern;Backwards;With walker Number of Stairs: 4 (c husband guarding)    Wheelchair Mobility    Modified Rankin (Stroke Patients Only)       Balance Overall balance assessment: No apparent balance deficits (not formally assessed)                                  Cognition Arousal/Alertness: Awake/alert Behavior During Therapy: WFL for tasks assessed/performed Overall Cognitive Status: Within Functional Limits for tasks assessed                      Exercises Total Joint Exercises Short Arc Quad: AROM;15 reps;Supine;Right Hip ABduction/ADduction: AROM;Right;15 reps;Supine Straight Leg Raises: AAROM;Right;15 reps;Supine Goniometric ROM: 8-79 degrees R knee Flexion    General Comments        Pertinent Vitals/Pain Pain Assessment: 0-10 Pain Score: 1  Pain Intervention(s): Ice applied    Home Living                      Prior Function            PT Goals (current goals can now be found in the care plan section) Acute Rehab PT Goals Patient Stated Goal: To go home PT Goal Formulation: With patient Time For Goal Achievement: 06/25/14 Potential to Achieve Goals: Good Progress towards PT goals: Progressing toward goals    Frequency  BID    PT Plan Current plan remains appropriate    Co-evaluation  End of Session Equipment Utilized During Treatment: Gait belt Activity Tolerance: Patient tolerated treatment well Patient left: with call bell/phone within reach;in bed;with bed alarm set;with family/visitor present     Time: 3968-8648 PT Time Calculation (min) (ACUTE ONLY): 24 min  Charges:  $Gait Training: 23-37 mins $Therapeutic Exercise: 8-22 mins                    G Codes:      Daijah Scrivens C 19-Jun-2014, 1:10 PM Etta Grandchild, PT, DPT, BM

## 2014-06-12 NOTE — Plan of Care (Signed)
Problem: Phase II Progression Outcomes Goal: Ambulates Outcome: Progressing Ambulating with one person assist and walker

## 2014-06-12 NOTE — Progress Notes (Signed)
Reposition of cuff and recheck.

## 2014-06-12 NOTE — Discharge Planning (Signed)
INSTRUCTIONS AFTER Surgery ° °o Remove items at home which could result in a fall. This includes throw rugs or furniture in walking pathways °o ICE to the affected joint every three hours while awake for 30 minutes at a time, for at least the first 3-5 days, and then as needed for pain and swelling.  Continue to use ice for pain and swelling. You may notice swelling that will progress down to the foot and ankle.  This is normal after surgery.  Elevate your leg when you are not up walking on it.   °o Continue to use the breathing machine you got in the hospital (incentive spirometer) which will help keep your temperature down.  It is common for your temperature to cycle up and down following surgery, especially at night when you are not up moving around and exerting yourself.  The breathing machine keeps your lungs expanded and your temperature down. ° ° °DIET:  As you were doing prior to hospitalization, we recommend a well-balanced diet. ° °DRESSING / WOUND CARE / SHOWERING ° °Keep the surgical dressing until follow up.  The dressing is water proof, so you can shower without any extra covering.  IF THE DRESSING FALLS OFF or the wound gets wet inside, change the dressing with sterile gauze.  Please use good hand washing techniques before changing the dressing.  Do not use any lotions or creams on the incision until instructed by your surgeon.   ° °ACTIVITY ° °o Increase activity slowly as tolerated, but follow the weight bearing instructions below.   °o No driving for 6 weeks or until further direction given by your physician.  You cannot drive while taking narcotics.  °o No lifting or carrying greater than 10 lbs. until further directed by your surgeon. °o Avoid periods of inactivity such as sitting longer than an hour when not asleep. This helps prevent blood clots.  °o You may return to work once you are authorized by your doctor.  ° ° ° °WEIGHT BEARING  ° °Weight bearing as tolerated with assist device (walker,  cane, etc) as directed, use it as long as suggested by your surgeon or therapist, typically at least 4-6 weeks. ° ° °EXERCISES ° °Results after joint surgery are often greatly improved when you follow the exercise, range of motion and muscle strengthening exercises prescribed by your doctor. Safety measures are also important to protect the joint from further injury. Any time any of these exercises cause you to have increased pain or swelling, decrease what you are doing until you are comfortable again and then slowly increase them. If you have problems or questions, call your caregiver or physical therapist for advice.  ° °Rehabilitation is important following a joint surgery. After just a few days of immobilization, the muscles of the leg can become weakened and shrink (atrophy).  These exercises are designed to build up the tone and strength of the thigh and leg muscles and to improve motion. Often times heat used for twenty to thirty minutes before working out will loosen up your tissues and help with improving the range of motion but do not use heat for the first two weeks following surgery (sometimes heat can increase post-operative swelling).  ° °These exercises can be done on a training (exercise) mat, on the floor, on a table or on a bed. Use whatever works the best and is most comfortable for you.    Use music or television while you are exercising so that the exercises are   a pleasant break in your day. This will make your life better with the exercises acting as a break in your routine that you can look forward to.   Perform all exercises about fifteen times, three times per day or as directed.  You should exercise both the operative leg and the other leg as well. ° °Exercises include: °  °• Quad Sets - Tighten up the muscle on the front of the thigh (Quad) and hold for 5-10 seconds.   °• Straight Leg Raises - With your knee straight (if you were given a brace, keep it on), lift the leg to 60 degrees,  hold for 3 seconds, and slowly lower the leg.  Perform this exercise against resistance later as your leg gets stronger.  °• Leg Slides: Lying on your back, slowly slide your foot toward your buttocks, bending your knee up off the floor (only go as far as is comfortable). Then slowly slide your foot back down until your leg is flat on the floor again.  °• Angel Wings: Lying on your back spread your legs to the side as far apart as you can without causing discomfort.  °• Hamstring Strength:  Lying on your back, push your heel against the floor with your leg straight by tightening up the muscles of your buttocks.  Repeat, but this time bend your knee to a comfortable angle, and push your heel against the floor.  You may put a pillow under the heel to make it more comfortable if necessary.  ° °A rehabilitation program following joint surgery can speed recovery and prevent re-injury in the future due to weakened muscles. Contact your doctor or a physical therapist for more information on knee rehabilitation.  ° ° °CONSTIPATION ° °Constipation is defined medically as fewer than three stools per week and severe constipation as less than one stool per week.  Even if you have a regular bowel pattern at home, your normal regimen is likely to be disrupted due to multiple reasons following surgery.  Combination of anesthesia, postoperative narcotics, change in appetite and fluid intake all can affect your bowels.  ° °YOU MUST use at least one of the following options; they are listed in order of increasing strength to get the job done.  They are all available over the counter, and you may need to use some, POSSIBLY even all of these options:   ° °Drink plenty of fluids (prune juice may be helpful) and high fiber foods °Colace 100 mg by mouth twice a day  °Senokot for constipation as directed and as needed Dulcolax (bisacodyl), take with full glass of water  °Miralax (polyethylene glycol) once or twice a day as needed. ° °If  you have tried all these things and are unable to have a bowel movement in the first 3-4 days after surgery call either your surgeon or your primary doctor.   ° °If you experience loose stools or diarrhea, hold the medications until you stool forms back up.  If your symptoms do not get better within 1 week or if they get worse, check with your doctor.  If you experience "the worst abdominal pain ever" or develop nausea or vomiting, please contact the office immediately for further recommendations for treatment. ° ° °ITCHING:  If you experience itching with your medications, try taking only a single pain pill, or even half a pain pill at a time.  You can also use Benadryl over the counter for itching or also to help with sleep.  ° °  TED HOSE STOCKINGS:  Use stockings on both legs until for at least 2 weeks or as directed by physician office. They may be removed at night for sleeping. ° °MEDICATIONS:  See your medication summary on the “After Visit Summary” that nursing will review with you.  You may have some home medications which will be placed on hold until you complete the course of blood thinner medication.  It is important for you to complete the blood thinner medication as prescribed. ° °PRECAUTIONS:  If you experience chest pain or shortness of breath - call 911 immediately for transfer to the hospital emergency department.  ° °If you develop a fever greater that 101 F, purulent drainage from wound, increased redness or drainage from wound, foul odor from the wound/dressing, or calf pain - CONTACT YOUR SURGEON.   °                                                °FOLLOW-UP APPOINTMENTS:  If you do not already have a post-op appointment, please call the office for an appointment to be seen by your surgeon.  Guidelines for how soon to be seen are listed in your “After Visit Summary”, but are typically between 1-4 weeks after surgery. ° °OTHER INSTRUCTIONS:  ° ° ° °MAKE SURE YOU:  °• Understand these instructions.   °• Get help right away if you are not doing well or get worse.  ° ° °Thank you for letting us be a part of your medical care team.  It is a privilege we respect greatly.  We hope these instructions will help you stay on track for a fast and full recovery!  °

## 2014-06-12 NOTE — Progress Notes (Signed)
Physical Therapy Treatment Patient Details Name: Tonya Walton MRN: [id number] DOB: 05-06-1950 Today's Date: 06/12/2014    History of Present Illness Pt is a 64yo female with history of BLE neuropathy from B12 deficicency, and DJD in R knee. Pt presents on POD1 with R TKA.     PT Comments    Pt is making good progress toward goals as evidence by improved gait distance and quality. Pt should continue to improve indep in HEP and strength with therex activities. Patient presents with impairment of strength, pain, range of motion, and activity tolerance, limiting ability to perform ADL, IADL, and ambulation. Patient will benefit from skilled intervention to address the above impairments and limitations, in order to restore to prior level of function and to decrease caregiver burden.    Follow Up Recommendations  Home health PT     Equipment Recommendations  Rolling walker with 5" wheels    Recommendations for Other Services       Precautions / Restrictions Precautions Precautions: Fall Restrictions Weight Bearing Restrictions: Yes RLE Weight Bearing: Weight bearing as tolerated    Mobility  Bed Mobility Overal bed mobility: Modified Independent Bed Mobility: Supine to Sit     Supine to sit: Modified independent (Device/Increase time)        Transfers Overall transfer level: Needs assistance Equipment used: Rolling walker (2 wheeled) Transfers: Sit to/from Stand Sit to Stand: Supervision (verbal safety cues for AD )            Ambulation/Gait Ambulation/Gait assistance: Min guard Ambulation Distance (Feet): 250 Feet Assistive device: Rolling walker (2 wheeled) Gait Pattern/deviations: Step-through pattern Gait velocity: .61m/s Gait velocity interpretation: <1.8 ft/sec, indicative of risk for recurrent falls General Gait Details: Gait near symmetrical, given cues for heel strike   Stairs            Wheelchair Mobility    Modified Rankin (Stroke  Patients Only)       Balance Overall balance assessment: No apparent balance deficits (not formally assessed)                                  Cognition Arousal/Alertness: Awake/alert Behavior During Therapy: WFL for tasks assessed/performed Overall Cognitive Status: Within Functional Limits for tasks assessed                      Exercises Total Joint Exercises Short Arc Quad: AROM;15 reps;Supine;Right Hip ABduction/ADduction: AROM;Right;15 reps;Supine Straight Leg Raises: AAROM;Right;15 reps;Supine Goniometric ROM: 8-79 degrees R knee Flexion    General Comments        Pertinent Vitals/Pain Pain Assessment: 0-10 Pain Score: 3  Pain Intervention(s): Ice applied;Monitored during session;Premedicated before session    Home Living                      Prior Function            PT Goals (current goals can now be found in the care plan section) Acute Rehab PT Goals Patient Stated Goal: To go home PT Goal Formulation: With patient Time For Goal Achievement: 06/25/14 Potential to Achieve Goals: Good Progress towards PT goals: Progressing toward goals    Frequency  BID    PT Plan Current plan remains appropriate    Co-evaluation             End of Session Equipment Utilized During Treatment: Gait belt Activity Tolerance: Patient tolerated  treatment well Patient left: in chair;with call bell/phone within reach;with chair alarm set     Time: [phone] PT Time Calculation (min) (ACUTE ONLY): 28 min  Charges:  $Gait Training: 23-37 mins $Therapeutic Exercise: 8-22 mins                    G Codes:      Buccola,Allan C 07/02/14, 10:07 AM  Etta Grandchild, PT, DPT, BM

## 2014-06-12 NOTE — Progress Notes (Signed)
   Subjective: 2 Days Post-Op Procedure(s) (LRB): TOTAL KNEE ARTHROPLASTY (Right) Patient reports pain as mild.   Patient is well, and has had no acute complaints or problems We will start therapy today.  Plan is to go Home after hospital stay.  Objective: Vital signs in last 24 hours: Temp:  [98 F (36.7 C)-99.9 F (37.7 C)] 98 F (36.7 C) (05/13 0424) Pulse Rate:  [72-89] 72 (05/13 0424) Resp:  [18-20] 18 (05/13 0424) BP: (111-141)/(40-61) 117/48 mmHg (05/13 0425) SpO2:  [87 %-97 %] 97 % (05/13 0424)  Intake/Output from previous day: 05/12 0701 - 05/13 0700 In: 1619 [P.O.:720; I.V.:899] Out: 2225 [Urine:2175; Drains:50] Intake/Output this shift: Total I/O In: -  Out: 350 [Urine:350]   Recent Labs  06/11/14 0516 06/12/14 0644  HGB 9.8* 9.4*    Recent Labs  06/11/14 0516 06/12/14 0644  WBC 8.4 8.5  RBC 3.35* 3.20*  HCT 30.2* 28.8*  PLT 230 199    Recent Labs  06/10/14 1727  NA 139  K 4.2  CL 104  CO2 25  BUN 11  CREATININE 0.75  GLUCOSE 123*  CALCIUM 8.3*   No results for input(s): LABPT, INR in the last 72 hours.  EXAM General - Patient is Alert, Appropriate and Oriented Extremity - Neurologically intact Neurovascular intact Sensation intact distally Intact pulses distally Dorsiflexion/Plantar flexion intact Dressing - dressing C/D/I, no drainage and hemovac removed Motor Function - intact, moving foot and toes well on exam.   Past Medical History  Diagnosis Date  . Anxiety   . Depression   . Dysrhythmia     Afib  . CHF (congestive heart failure)   . Hypertension   . COPD (chronic obstructive pulmonary disease)   . Hypothyroidism   . GERD (gastroesophageal reflux disease)   . Headache   . 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 Arthritis   . Cancer     right breast    Assessment/Plan:   2 Days Post-Op Procedure(s) (LRB): TOTAL KNEE ARTHROPLASTY (Right) Active Problems:   Total knee replacement  status  Estimated body mass index is 39.12 kg/(m^2) as calculated from the following:   Height as of this encounter: 5\' 6"  (1.676 m).   Weight as of this encounter: 109.9 kg (242 lb 4.6 oz). Advance diet Up with therapy  Needs BM  DVT Prophylaxis - Lovenox Weight-Bearing as tolerated to Right leg D/C O2 and Pulse OX and try on Room Air  T. Rachelle Hora, PA-C [place] 06/12/2014, 7:55 AM

## 2014-06-12 NOTE — Progress Notes (Signed)
POD 2, right total knee. Pain controlled well w/ PO meds. Dsg dry/intact. Mobility encouraged. Plan is d/c home w/ HH. Pt rested quietly during most of shift. No acute distress, will monitor.

## 2014-06-12 NOTE — Plan of Care (Signed)
Problem: Phase II Progression Outcomes Goal: Discharge plan established Outcome: Progressing Plan for home discharge w/ [facility]

## 2014-06-13 LAB — CBC
HEMATOCRIT: 28.2 % — AB (ref 35.0–47.0)
Hemoglobin: 9.4 g/dL — ABNORMAL LOW (ref 12.0–16.0)
MCH: 30.2 pg (ref 26.0–34.0)
MCHC: 33.2 g/dL (ref 32.0–36.0)
MCV: 91 fL (ref 80.0–100.0)
Platelets: 199 10*3/uL (ref 150–440)
RBC: 3.1 MIL/uL — ABNORMAL LOW (ref 3.80–5.20)
RDW: 14 % (ref 11.5–14.5)
WBC: 8.2 10*3/uL (ref 3.6–11.0)

## 2014-06-13 MED ORDER — TRAMADOL HCL 50 MG PO TABS: 50.0000 mg | ORAL_TABLET | ORAL | Status: DC | PRN

## 2014-06-13 MED ORDER — CELECOXIB 200 MG PO CAPS: 200.0000 mg | ORAL_CAPSULE | Freq: Two times a day (BID) | ORAL | Status: DC

## 2014-06-13 MED ORDER — ENOXAPARIN SODIUM 30 MG/0.3ML ~~LOC~~ SOLN: 40.0000 mg | Freq: Two times a day (BID) | SUBCUTANEOUS | Status: DC

## 2014-06-13 MED ORDER — OXYCODONE HCL 5 MG PO TABS: 5.0000 mg | ORAL_TABLET | Freq: Four times a day (QID) | ORAL | Status: DC | PRN

## 2014-06-13 NOTE — Discharge Summary (Signed)
Physician Discharge Summary  Subjective: 3 Days Post-Op Procedure(s) (LRB): TOTAL KNEE ARTHROPLASTY (Right) Patient reports pain as mild.   Patient seen in rounds with Dr. Marry Guan. Patient is well, and has had no acute complaints or problems Patient is ready to go Home with HHPT  Objective: Vital signs in last 24 hours: Temp:  [98 F (36.7 C)-99.1 F (37.3 C)] 98.5 F (36.9 C) (05/14 0443) Pulse Rate:  [71-85] 71 (05/14 0443) Resp:  [16-18] 18 (05/14 0443) BP: (118-145)/(41-54) 127/53 mmHg (05/14 0443) SpO2:  [95 %-98 %] 95 % (05/14 0443)  Intake/Output from previous day:  Intake/Output Summary (Last 24 hours) at 06/13/14 0643 Last data filed at 06/12/14 2202  Gross per 24 hour  Intake    720 ml  Output    950 ml  Net   -230 ml    Intake/Output this shift: Total I/O In: 240 [P.O.:240] Out: -   Labs:  Recent Labs  06/11/14 0516 06/12/14 0644 06/13/14 0412  HGB 9.8* 9.4* 9.4*    Recent Labs  06/12/14 0644 06/13/14 0412  WBC 8.5 8.2  RBC 3.20* 3.10*  HCT 28.8* 28.2*  PLT 199 199    Recent Labs  06/10/14 1727  NA 139  K 4.2  CL 104  CO2 25  BUN 11  CREATININE 0.75  GLUCOSE 123*  CALCIUM 8.3*   No results for input(s): LABPT, INR in the last 72 hours.  EXAM: General - Patient is Alert and Oriented Extremity - Neurovascular intact Dorsiflexion/Plantar flexion intact Incision: dressing C/D/I Incision - clean, dry, healing Motor Function - intact, moving foot and toes well on exam.   Assessment/Plan: 3 Days Post-Op Procedure(s) (LRB): TOTAL KNEE ARTHROPLASTY (Right) Procedure(s) (LRB): TOTAL KNEE ARTHROPLASTY (Right) Past Medical History  Diagnosis Date  . Anxiety   . Depression   . Dysrhythmia     Afib  . CHF (congestive heart failure)   . Hypertension   . COPD (chronic obstructive pulmonary disease)   . Hypothyroidism   . GERD (gastroesophageal reflux disease)   . Headache   . 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  Arthritis   . Cancer     right breast   Active Problems:   Total knee replacement status  Estimated body mass index is 39.12 kg/(m^2) as calculated from the following:   Height as of this encounter: 5\' 6"  (1.676 m).   Weight as of this encounter: 109.9 kg (242 lb 4.6 oz). Discharge home with home health Diet - Regular diet Follow up - in 2 weeks Activity - WBAT Disposition - Home Condition Upon Discharge - Good D/C Meds - See DC Summary DVT Prophylaxis - Lovenox and TED hose  Reche Dixon, PA-C Orthopaedic Surgery 06/13/2014, 6:43 AM

## 2014-06-13 NOTE — Progress Notes (Signed)
Occupational Therapy Treatment Patient Details Name: Tonya Walton MRN: [id number] DOB: 1951-01-08 Today's Date: 06/13/2014    History of present illness Pt is a 64yo female with history of BLE neuropathy from B12 deficicency, and DJD in R knee. Pt presents on POD1 with R TKA.    OT comments  Patient able to donn socks with extra time without need for AE. Patient requested additional education on donning TED hose. Provided education on use of sock aide to donn TED hose. Patient performed toileting with Supervision and verbal cues for safety in tight spaces and hand placement during sit/stand. Educated on equipment needs for bathing in shower/bathtub when discharged home.    Follow Up Recommendations  No OT follow up    Equipment Recommendations  Tub/shower seat    Recommendations for Other Services      Precautions / Restrictions Precautions Precautions: Fall Restrictions Weight Bearing Restrictions: Yes RLE Weight Bearing: Weight bearing as tolerated       Mobility Bed Mobility Overal bed mobility: Modified Independent Bed Mobility: Supine to Sit     Supine to sit: Modified independent (Device/Increase time)        Transfers Overall transfer level: Modified independent Equipment used: Rolling walker (2 wheeled) Transfers: Sit to/from Stand Sit to Stand: Modified independent (Device/Increase time)              Balance Overall balance assessment: No apparent balance deficits (not formally assessed)                                 ADL Overall ADL's : Needs assistance/impaired                     Lower Body Dressing: Minimal assistance;With adaptive equipment Lower Body Dressing Details (indicate cue type and reason): Able to manage socks with extra time and no equipment, difficulty with TED hose and educated on use of sock aid to donn. Toilet Transfer: Copy Details (indicate cue type and reason): cues for  safety managing tight spaces and proper hand placement for sit/stand Toileting- Clothing Manipulation and Hygiene: Supervision/safety Toileting - Clothing Manipulation Details (indicate cue type and reason): cues for safety with sit/stand              Vision                     Perception     Praxis      Cognition   Behavior During Therapy: [facility] for tasks assessed/performed Overall Cognitive Status: Within Functional Limits for tasks assessed                       Extremity/Trunk Assessment               Exercises Total Joint Exercises Goniometric ROM: 5-100 degrees   Shoulder Instructions       General Comments      Pertinent Vitals/ Pain          Home Living                                          Prior Functioning/Environment              Frequency       Progress Toward Goals  OT Goals(current goals  can now be found in the care plan section)  Progress towards OT goals: Progressing toward goals  Acute Rehab OT Goals Patient Stated Goal: To go home OT Goal Formulation: With patient Time For Goal Achievement: 06/25/14 Potential to Achieve Goals: Good  Plan Discharge plan remains appropriate    Co-evaluation                 End of Session     Activity Tolerance Patient tolerated treatment well;Patient limited by pain   Patient Left in bed;with call bell/phone within reach   Nurse Communication          Time: 0375-4360 OT Time Calculation (min): 23 min  Charges: OT General Charges $OT Visit: 1 Procedure OT Treatments $Self Care/Home Management : 23-37 mins  Brooklynne Pereida L  Tiffini Blacksher L, OT  06/13/2014, 11:34 AM

## 2014-06-13 NOTE — Progress Notes (Signed)
Tonya Walton had previously chosen [place] as her home health provider. A referral was faxed to [place] today requesting home health PT.

## 2014-06-13 NOTE — Progress Notes (Signed)
Physical Therapy Treatment Patient Details Name: Tonya Walton MRN: [id number] DOB: 04-Sep-1950 Today's Date: 06/13/2014    History of Present Illness Pt is a 64yo female with history of BLE neuropathy from B12 deficicency, and DJD in R knee. Pt presents on POD1 with R TKA.     PT Comments    Pt is making strong progress toward goals. All pt concerns have been addressed regarding HEP, and pt reports confidence in indep performance s/p DC. Patient presents with impairment of strength, range of motion, and activity tolerance, limiting ability to perform ADL, IADL, and ambulation. Patient will benefit from skilled intervention to address the above impairments and limitations, in order to restore to prior level of function and to decrease caregiver burden.    Follow Up Recommendations  Home health PT     Equipment Recommendations  Rolling walker with 5" wheels    Recommendations for Other Services       Precautions / Restrictions Precautions Precautions: Fall Restrictions Weight Bearing Restrictions: Yes RLE Weight Bearing: Weight bearing as tolerated    Mobility  Bed Mobility Overal bed mobility: Modified Independent Bed Mobility: Supine to Sit     Supine to sit: Modified independent (Device/Increase time)        Transfers Overall transfer level: Modified independent Equipment used: Rolling walker (2 wheeled) Transfers: Sit to/from Stand Sit to Stand: Modified independent (Device/Increase time)            Ambulation/Gait Ambulation/Gait assistance: Modified independent (Device/Increase time) Ambulation Distance (Feet): 250 Feet Assistive device: Rolling walker (2 wheeled) Gait Pattern/deviations: Step-through pattern Gait velocity: .34 Gait velocity interpretation: <1.8 ft/sec, indicative of risk for recurrent falls General Gait Details: Gait near symmetrical, given cues for heel strike   Stairs            Wheelchair Mobility    Modified Rankin  (Stroke Patients Only)       Balance Overall balance assessment: No apparent balance deficits (not formally assessed)                                  Cognition Arousal/Alertness: Awake/alert Behavior During Therapy: WFL for tasks assessed/performed Overall Cognitive Status: Within Functional Limits for tasks assessed                      Exercises Total Joint Exercises Goniometric ROM: 5-100 degrees    General Comments        Pertinent Vitals/Pain      Home Living                      Prior Function            PT Goals (current goals can now be found in the care plan section) Acute Rehab PT Goals Patient Stated Goal: To go home PT Goal Formulation: With patient Time For Goal Achievement: 06/25/14 Potential to Achieve Goals: Good Progress towards PT goals: Progressing toward goals    Frequency  BID    PT Plan Current plan remains appropriate    Co-evaluation             End of Session Equipment Utilized During Treatment: Gait belt Activity Tolerance: Patient tolerated treatment well Patient left: in bed;with bed alarm set;with call bell/phone within reach     Time: 0743-0800 PT Time Calculation (min) (ACUTE ONLY): 17 min  Charges:  $Therapeutic Activity: 8-22 mins  G Codes:      Rehaan Viloria C 2014-06-18, 8:15 AM  Etta Grandchild, PT, DPT, BM

## 2014-06-13 NOTE — Progress Notes (Signed)
Pt remaining alert and oriented. Minimal pain this shift. Up to bathroom with 1 person assist. Able to move bowels this shift and is voiding without difficulty.

## 2014-06-13 NOTE — Progress Notes (Signed)
  Subjective: 3 Days Post-Op Procedure(s) (LRB): TOTAL KNEE ARTHROPLASTY (Right) Patient reports pain as mild.   Patient seen in rounds with Dr. Marry Guan. Patient is well, and has had no acute complaints or problems Plan is to go Home after hospital stay. Negative for chest pain and shortness of breath Fever: no Gastrointestinal:negative for nausea and vomiting  Objective: Vital signs in last 24 hours: Temp:  [98 F (36.7 C)-99.1 F (37.3 C)] 98.5 F (36.9 C) (05/14 0443) Pulse Rate:  [71-85] 71 (05/14 0443) Resp:  [16-18] 18 (05/14 0443) BP: (118-145)/(41-54) 127/53 mmHg (05/14 0443) SpO2:  [95 %-98 %] 95 % (05/14 0443)  Intake/Output from previous day:  Intake/Output Summary (Last 24 hours) at 06/13/14 0636 Last data filed at 06/12/14 2202  Gross per 24 hour  Intake    720 ml  Output    950 ml  Net   -230 ml    Intake/Output this shift: Total I/O In: 240 [P.O.:240] Out: -   Labs:  Recent Labs  06/11/14 0516 06/12/14 0644 06/13/14 0412  HGB 9.8* 9.4* 9.4*    Recent Labs  06/12/14 0644 06/13/14 0412  WBC 8.5 8.2  RBC 3.20* 3.10*  HCT 28.8* 28.2*  PLT 199 199    Recent Labs  06/10/14 1727  NA 139  K 4.2  CL 104  CO2 25  BUN 11  CREATININE 0.75  GLUCOSE 123*  CALCIUM 8.3*   No results for input(s): LABPT, INR in the last 72 hours.   EXAM General - Patient is Alert and Oriented Extremity - Neurovascular intact Dorsiflexion/Plantar flexion intact Incision: dressing C/D/I Dressing/Incision - clean, dry, healed Motor Function - intact, moving foot and toes well on exam.   Past Medical History  Diagnosis Date  . Anxiety   . Depression   . Dysrhythmia     Afib  . CHF (congestive heart failure)   . Hypertension   . COPD (chronic obstructive pulmonary disease)   . Hypothyroidism   . GERD (gastroesophageal reflux disease)   . Headache   . 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 Arthritis   . Cancer     right breast     Assessment/Plan: 3 Days Post-Op Procedure(s) (LRB): TOTAL KNEE ARTHROPLASTY (Right) Active Problems:   Total knee replacement status  Estimated body mass index is 39.12 kg/(m^2) as calculated from the following:   Height as of this encounter: 5\' 6"  (1.676 m).   Weight as of this encounter: 109.9 kg (242 lb 4.6 oz). Discharge home with home health  DVT Prophylaxis - Lovenox, Foot Pumps and TED hose Weight-Bearing as tolerated to Right leg  Reche Dixon, PA-C Orthopaedic Surgery 06/13/2014, 6:36 AM

## 2014-06-13 NOTE — Progress Notes (Signed)
Pt tolerated physical therapy sa;ome lock d/c rounds by Dr Rudene Christians discharged with belongings, polar care , to care of husband  verbalilzed understanding

## 2014-06-14 ENCOUNTER — Encounter: Payer: Self-pay | Admitting: Orthopedic Surgery

## 2014-06-15 NOTE — Anesthesia Postprocedure Evaluation (Signed)
  Anesthesia Post-op Note  Patient: Tonya Walton  Procedure(s) Performed: Procedure(s): TOTAL KNEE ARTHROPLASTY (Right)  Anesthesia type:General ETT  Patient location: PACU  Post pain: Pain level controlled  Post assessment: Post-op Vital signs reviewed, Patient's Cardiovascular Status Stable, Respiratory Function Stable, Patent Airway and No signs of Nausea or vomiting  Post vital signs: Reviewed and stable  Last Vitals:  Filed Vitals:   06/13/14 0828  BP: 126/54  Pulse: 73  Temp: 37.6 C  Resp: 18    Level of consciousness: awake, alert  and patient cooperative  Complications: No apparent anesthesia complications

## 2014-06-25 DIAGNOSIS — Z96659 Presence of unspecified artificial knee joint: Secondary | ICD-10-CM | POA: Insufficient documentation

## 2014-07-16 ENCOUNTER — Ambulatory Visit (INDEPENDENT_AMBULATORY_CARE_PROVIDER_SITE_OTHER): Payer: PPO | Admitting: Psychiatry

## 2014-07-16 VITALS — BP 122/74

## 2014-07-16 DIAGNOSIS — F331 Major depressive disorder, recurrent, moderate: Secondary | ICD-10-CM

## 2014-07-16 MED ORDER — FLUOXETINE HCL 40 MG PO CAPS: 40.0000 mg | ORAL_CAPSULE | Freq: Every day | ORAL | Status: DC

## 2014-07-16 MED ORDER — ZOLPIDEM TARTRATE 5 MG PO TABS: 5.0000 mg | ORAL_TABLET | Freq: Every day | ORAL | Status: DC

## 2014-07-16 MED ORDER — CLONAZEPAM 1 MG PO TABS: 1.0000 mg | ORAL_TABLET | Freq: Every day | ORAL | Status: DC

## 2014-07-16 NOTE — Progress Notes (Signed)
BH MD/PA/NP OP Progress Note  07/16/2014 9:38 AM Tonya Walton  MRN:  [id number]  Subjective:  She is a 64 year old female who presented for the follow-up. She has history of depression and came back after 3 months as she reported that she had the knee surgery on the right side. Patient is currently recuperating from her knee surgery. Patient reported that she is doing well and is completing her physical therapy. She reported that she is looking forward to have the knee surgery done on the left side as well. She reported that she is not having any difficulty walking at this time. She reported that she is taking her medications as prescribed and her depressive symptoms are improving. She takes Prozac in the morning and sleeps well with the help of Klonopin and Ambien she takes on a when necessary basis. She also takes Benadryl at night. She denied having any adverse effects of the medications. She denied having any suicidal homicidal ideations or plans.   Chief Complaint:  Chief Complaint    Follow-up; Depression     Visit Diagnosis:     ICD-9-CM ICD-10-CM   1. MDD (major depressive disorder), recurrent episode, moderate 296.32 F33.1     Past Medical History:  Past Medical History  Diagnosis Date  . Anxiety   . Depression   . Dysrhythmia     Afib  . CHF (congestive heart failure)   . Hypertension   . COPD (chronic obstructive pulmonary disease)   . Hypothyroidism   . GERD (gastroesophageal reflux disease)   . Headache   . 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 Arthritis   . Cancer     right breast    Past Surgical History  Procedure Laterality Date  . Breast lumpectomy      Right breast  . Appendectomy    . Tonsillectomy    . Total knee arthroplasty Right 06/10/2014    Procedure: TOTAL KNEE ARTHROPLASTY;  Surgeon: Dereck Leep, MD;  Location: [facility] ORS;  Service: Orthopedics;  Laterality: Right;   Family History:  Family History  Problem Relation Age of  Onset  . Leukemia Mother   . Bone cancer Father   . Heart disease Brother    Social History:  History   Social History  . Marital Status: Married    Spouse Name: N/A  . Number of Children: N/A  . Years of Education: N/A   Social History Main Topics  . Smoking status: Former Research scientist (life sciences)  . Smokeless tobacco: Never Used  . Alcohol Use: No  . Drug Use: No  . Sexual Activity: No   Other Topics Concern  . Not on file   Social History Narrative   Additional History:  She currently lives with her husband who is very helpful and has been taking care of her during her surgery. She reported that he is a good  nurse  Assessment:   Musculoskeletal: Strength & Muscle Tone: within normal limits Gait & Station: normal Patient leans: N/A  Psychiatric Specialty Exam: HPI  Review of Systems  Constitutional: Negative for chills.  HENT: Negative for congestion and tinnitus.   Eyes: Negative for double vision.  Respiratory: Negative for hemoptysis.   Cardiovascular: Negative for palpitations.  Gastrointestinal: Positive for diarrhea. Negative for nausea.  Genitourinary: Negative for urgency.  Musculoskeletal: Positive for joint pain. Negative for back pain.  Skin: Negative for rash.  Neurological: Negative for sensory change.  Endo/Heme/Allergies: Negative for environmental allergies.  Psychiatric/Behavioral: Positive for depression. Negative for suicidal ideas and substance abuse. The patient does not have insomnia.     There were no vitals taken for this visit.There is no weight on file to calculate BMI.  General Appearance: Casual  Eye Contact:  Fair  Speech:  Normal Rate  Volume:  Normal  Mood:  Euthymic  Affect:  Appropriate  Thought Process:  Coherent  Orientation:  Full (Time, Place, and Person)  Thought Content:  WDL  Suicidal Thoughts:  No  Homicidal Thoughts:  No  Memory:  Immediate;   Fair  Judgement:  Fair  Insight:  Fair  Psychomotor Activity:  Normal   Concentration:  Fair  Recall:  [employer] of Knowledge: Fair  Language: Fair  Akathisia:  No  Handed:  Right  AIMS (if indicated):  none  Assets:  Communication Skills Desire for Improvement Social Support  ADL's:  Intact  Cognition: WNL  Sleep:  8-10   Is the patient at risk to self?  No. Has the patient been a risk to self in the past 6 months?  No. Has the patient been a risk to self within the distant past?  No. Is the patient a risk to others?  No. Has the patient been a risk to others in the past 6 months?  No. Has the patient been a risk to others within the distant past?  No.  Current Medications: Current Outpatient Prescriptions  Medication Sig Dispense Refill  . acetaminophen (TYLENOL) 500 MG tablet Take 1,000 mg by mouth at bedtime.    Marland Kitchen anastrozole (ARIMIDEX) 1 MG tablet Take 1 mg by mouth at bedtime.    . celecoxib (CELEBREX) 200 MG capsule Take 1 capsule (200 mg total) by mouth every 12 (twelve) hours. 60 capsule 1  . clonazePAM (KLONOPIN) 1 MG tablet Take 1 tablet by mouth at bedtime.    . cyclobenzaprine (FLEXERIL) 10 MG tablet Take 10 mg by mouth 2 (two) times daily as needed for muscle spasms.    . diphenhydrAMINE (BENADRYL) 25 MG tablet Take 25 mg by mouth at bedtime.    . enoxaparin (LOVENOX) 30 MG/0.3ML injection Inject 0.4 mLs (40 mg total) into the skin every 12 (twelve) hours. 14 Syringe 0  . FLUoxetine (PROZAC) 40 MG capsule Take 1 capsule by mouth daily.    . furosemide (LASIX) 40 MG tablet Take 40 mg by mouth daily.    Marland Kitchen gabapentin (NEURONTIN) 300 MG capsule Take 900 mg by mouth 3 (three) times daily.    Marland Kitchen levothyroxine (SYNTHROID, LEVOTHROID) 25 MCG tablet Take 25 mcg by mouth daily.     . metoprolol succinate (TOPROL-XL) 50 MG 24 hr tablet Take 50 mg by mouth at bedtime.     . montelukast (SINGULAIR) 10 MG tablet Take 10 mg by mouth at bedtime.    Marland Kitchen oxybutynin (DITROPAN) 5 MG tablet Take 5 mg by mouth 3 (three) times daily as needed for bladder  spasms.    . pramipexole (MIRAPEX) 0.25 MG tablet Take 0.25-0.5 mg by mouth 3 (three) times daily. Pt takes two tablets in the morning, one tablet at noon, and two tablets at bedtime.    . traMADol (ULTRAM) 50 MG tablet Take 1-2 tablets (50-100 mg total) by mouth every 4 (four) hours as needed for moderate pain. 60 tablet 1  . zolpidem (AMBIEN) 5 MG tablet Take 1 tablet by mouth at bedtime.    Marland Kitchen oxyCODONE (OXY IR/ROXICODONE) 5 MG immediate release tablet Take 1-2 tablets (5-10  mg total) by mouth every 6 (six) hours as needed for breakthrough pain. (Patient not taking: Reported on 07/16/2014) 30 tablet 0   No current facility-administered medications for this visit.    Medical Decision Making:  Established Problem, Stable/Improving (1) and Review of Last Therapy Session (1)  Treatment Plan Summary:Medication management  Discussed with patient about the medications and advised her to decrease the use of sedating medications at night. She agreed with the plan and she will try to stop taking Benadryl as well as Ambien as it increases the risk of falls. She was given the prescriptions of all her medications at this time. Patient will follow up in 3 months or earlier depending on her condition    More than 50% of the time spent in psychoeducation, counseling and coordination of care.    This note was generated in part or whole with voice recognition software. Voice regonition is usually quite accurate but there are transcription errors that can and very often do occur. I apologize for any typographical errors that were not detected and corrected.    Rainey Pines 07/16/2014, 9:38 AM

## 2014-07-31 DIAGNOSIS — I480 Paroxysmal atrial fibrillation: Secondary | ICD-10-CM | POA: Insufficient documentation

## 2014-08-19 ENCOUNTER — Inpatient Hospital Stay: Admission: RE | Admit: 2014-08-19 | Payer: PPO | Source: Ambulatory Visit

## 2014-08-19 ENCOUNTER — Other Ambulatory Visit: Payer: Self-pay | Admitting: Internal Medicine

## 2014-08-19 DIAGNOSIS — M5412 Radiculopathy, cervical region: Secondary | ICD-10-CM

## 2014-08-20 ENCOUNTER — Encounter
Admission: RE | Admit: 2014-08-20 | Discharge: 2014-08-20 | Disposition: A | Discharge status: Home / Self Care | Payer: PPO | Source: Ambulatory Visit | Attending: Orthopedic Surgery | Admitting: Orthopedic Surgery

## 2014-08-20 DIAGNOSIS — Z8719 Personal history of other diseases of the digestive system: Secondary | ICD-10-CM | POA: Diagnosis not present

## 2014-08-20 DIAGNOSIS — M4802 Spinal stenosis, cervical region: Secondary | ICD-10-CM | POA: Diagnosis not present

## 2014-08-20 DIAGNOSIS — I10 Essential (primary) hypertension: Secondary | ICD-10-CM | POA: Diagnosis not present

## 2014-08-20 DIAGNOSIS — M5412 Radiculopathy, cervical region: Secondary | ICD-10-CM | POA: Diagnosis present

## 2014-08-20 DIAGNOSIS — Z853 Personal history of malignant neoplasm of breast: Secondary | ICD-10-CM | POA: Diagnosis not present

## 2014-08-20 DIAGNOSIS — G2581 Restless legs syndrome: Secondary | ICD-10-CM | POA: Diagnosis not present

## 2014-08-20 DIAGNOSIS — I509 Heart failure, unspecified: Secondary | ICD-10-CM | POA: Diagnosis not present

## 2014-08-20 DIAGNOSIS — J449 Chronic obstructive pulmonary disease, unspecified: Secondary | ICD-10-CM | POA: Diagnosis not present

## 2014-08-20 DIAGNOSIS — E538 Deficiency of other specified B group vitamins: Secondary | ICD-10-CM | POA: Diagnosis not present

## 2014-08-20 DIAGNOSIS — Z808 Family history of malignant neoplasm of other organs or systems: Secondary | ICD-10-CM | POA: Diagnosis not present

## 2014-08-20 DIAGNOSIS — I4891 Unspecified atrial fibrillation: Secondary | ICD-10-CM | POA: Diagnosis not present

## 2014-08-20 DIAGNOSIS — M5032 Other cervical disc degeneration, mid-cervical region: Secondary | ICD-10-CM | POA: Diagnosis not present

## 2014-08-20 DIAGNOSIS — M47892 Other spondylosis, cervical region: Secondary | ICD-10-CM | POA: Diagnosis not present

## 2014-08-20 DIAGNOSIS — Z806 Family history of leukemia: Secondary | ICD-10-CM | POA: Diagnosis not present

## 2014-08-20 HISTORY — DX: Restless legs syndrome: G25.81

## 2014-08-20 HISTORY — DX: Reserved for inherently not codable concepts without codable children: IMO0001

## 2014-08-20 HISTORY — DX: Unspecified atrial fibrillation: I48.91

## 2014-08-20 LAB — URINALYSIS COMPLETE WITH MICROSCOPIC (ARMC ONLY)
Bacteria, UA: NONE SEEN
Bilirubin Urine: NEGATIVE
Glucose, UA: NEGATIVE mg/dL
Hgb urine dipstick: NEGATIVE
KETONES UR: NEGATIVE mg/dL
LEUKOCYTES UA: NEGATIVE
NITRITE: NEGATIVE
Protein, ur: NEGATIVE mg/dL
RBC / HPF: NONE SEEN RBC/hpf (ref 0–5)
SPECIFIC GRAVITY, URINE: 1.023 (ref 1.005–1.030)
pH: 5 (ref 5.0–8.0)

## 2014-08-20 LAB — BASIC METABOLIC PANEL
ANION GAP: 8 (ref 5–15)
BUN: 27 mg/dL — AB (ref 6–20)
CALCIUM: 9.1 mg/dL (ref 8.9–10.3)
CO2: 26 mmol/L (ref 22–32)
CREATININE: 0.71 mg/dL (ref 0.44–1.00)
Chloride: 106 mmol/L (ref 101–111)
Glucose, Bld: 121 mg/dL — ABNORMAL HIGH (ref 65–99)
POTASSIUM: 4.1 mmol/L (ref 3.5–5.1)
SODIUM: 140 mmol/L (ref 135–145)

## 2014-08-20 LAB — CBC
HCT: 36.8 % (ref 35.0–47.0)
Hemoglobin: 11.8 g/dL — ABNORMAL LOW (ref 12.0–16.0)
MCH: 28 pg (ref 26.0–34.0)
MCHC: 32.1 g/dL (ref 32.0–36.0)
MCV: 87.1 fL (ref 80.0–100.0)
PLATELETS: 328 10*3/uL (ref 150–440)
RBC: 4.23 MIL/uL (ref 3.80–5.20)
RDW: 14.6 % — ABNORMAL HIGH (ref 11.5–14.5)
WBC: 9.4 10*3/uL (ref 3.6–11.0)

## 2014-08-20 LAB — ABO/RH: ABO/RH(D): O POS

## 2014-08-20 LAB — PROTIME-INR
INR: 1
PROTHROMBIN TIME: 13.4 s (ref 11.4–15.0)

## 2014-08-20 LAB — TYPE AND SCREEN
ABO/RH(D): O POS
Antibody Screen: NEGATIVE

## 2014-08-20 LAB — SURGICAL PCR SCREEN
MRSA, PCR: POSITIVE — AB
Staphylococcus aureus: POSITIVE — AB

## 2014-08-20 LAB — APTT: aPTT: 24 seconds (ref 24–36)

## 2014-08-20 LAB — SEDIMENTATION RATE: Sed Rate: 34 mm/hr — ABNORMAL HIGH (ref 0–30)

## 2014-08-20 NOTE — Patient Instructions (Signed)
  Your procedure is scheduled on: September 02, 2014 (Wednesday) Report to Day Surgery. To find out your arrival time please call [phone] between 1PM - 3PM on September 01, 2014 (Tuesday).  Remember: Instructions that are not followed completely may result in serious medical risk, up to and including death, or upon the discretion of your surgeon and anesthesiologist your surgery may need to be rescheduled.    __x__ 1. Do not eat food or drink liquids after midnight. No gum chewing or hard candies.     ____ 2. No Alcohol for 24 hours before or after surgery.   ____ 3. Bring all medications with you on the day of surgery if instructed.    __x__ 4. Notify your doctor if there is any change in your medical condition     (cold, fever, infections).     Do not wear jewelry, make-up, hairpins, clips or nail polish.  Do not wear lotions, powders, or perfumes. You may wear deodorant.  Do not shave 48 hours prior to surgery. Men may shave face and neck.  Do not bring valuables to the hospital.    [facility] is not responsible for any belongings or valuables.               Contacts, dentures or bridgework may not be worn into surgery.  Leave your suitcase in the car. After surgery it may be brought to your room.  For patients admitted to the hospital, discharge time is determined by your                treatment team.   Patients discharged the day of surgery will not be allowed to drive home.   Please read over the following fact sheets that you were given:   MRSA Information and Surgical Site Infection Prevention   ____ Take these medicines the morning of surgery with A SIP OF WATER:    1. Metoprolol  2. Gabapentin  3.   4.  5.  6.  ____ Fleet Enema (as directed)   __x__ Use CHG Soap as directed  ____ Use inhalers on the day of surgery  ____ Stop metformin 2 days prior to surgery    ____ Take 1/2 of usual insulin dose the night before surgery and none on the morning of surgery.    ____ Stop Coumadin/Plavix/aspirin on   __x__ Stop Anti-inflammatories on (STOP MELOXICAM 7-10 DAYS PRIOR TO SURGERY)   ____ Stop supplements until after surgery.    ____ Bring C-Pap to the hospital.

## 2014-08-20 NOTE — Pre-Procedure Instructions (Signed)
Dr. Marry Guan office notified of positive MRSA results

## 2014-08-21 ENCOUNTER — Ambulatory Visit
Admission: RE | Admit: 2014-08-21 | Discharge: 2014-08-21 | Disposition: A | Discharge status: Home / Self Care | Payer: PPO | Source: Ambulatory Visit | Attending: Internal Medicine | Admitting: Internal Medicine

## 2014-08-21 DIAGNOSIS — M5412 Radiculopathy, cervical region: Secondary | ICD-10-CM

## 2014-08-21 DIAGNOSIS — E538 Deficiency of other specified B group vitamins: Secondary | ICD-10-CM | POA: Insufficient documentation

## 2014-08-21 DIAGNOSIS — Z853 Personal history of malignant neoplasm of breast: Secondary | ICD-10-CM | POA: Insufficient documentation

## 2014-08-21 DIAGNOSIS — M5032 Other cervical disc degeneration, mid-cervical region: Secondary | ICD-10-CM | POA: Diagnosis not present

## 2014-08-21 DIAGNOSIS — Z808 Family history of malignant neoplasm of other organs or systems: Secondary | ICD-10-CM | POA: Insufficient documentation

## 2014-08-21 DIAGNOSIS — I4891 Unspecified atrial fibrillation: Secondary | ICD-10-CM | POA: Insufficient documentation

## 2014-08-21 DIAGNOSIS — M47892 Other spondylosis, cervical region: Secondary | ICD-10-CM | POA: Insufficient documentation

## 2014-08-21 DIAGNOSIS — Z8719 Personal history of other diseases of the digestive system: Secondary | ICD-10-CM | POA: Insufficient documentation

## 2014-08-21 DIAGNOSIS — G2581 Restless legs syndrome: Secondary | ICD-10-CM | POA: Insufficient documentation

## 2014-08-21 DIAGNOSIS — J449 Chronic obstructive pulmonary disease, unspecified: Secondary | ICD-10-CM | POA: Insufficient documentation

## 2014-08-21 DIAGNOSIS — I10 Essential (primary) hypertension: Secondary | ICD-10-CM | POA: Insufficient documentation

## 2014-08-21 DIAGNOSIS — Z806 Family history of leukemia: Secondary | ICD-10-CM | POA: Insufficient documentation

## 2014-08-21 DIAGNOSIS — M4802 Spinal stenosis, cervical region: Secondary | ICD-10-CM | POA: Insufficient documentation

## 2014-08-21 DIAGNOSIS — I509 Heart failure, unspecified: Secondary | ICD-10-CM | POA: Insufficient documentation

## 2014-08-22 LAB — URINE CULTURE

## 2014-08-24 NOTE — OR Nursing (Signed)
urine culture results to surgeon

## 2014-09-02 ENCOUNTER — Inpatient Hospital Stay: Payer: PPO

## 2014-09-02 ENCOUNTER — Inpatient Hospital Stay: Payer: PPO | Admitting: Anesthesiology

## 2014-09-02 ENCOUNTER — Inpatient Hospital Stay
Admission: RE | Admit: 2014-09-02 | Discharge: 2014-09-05 | DRG: 470 | Disposition: A | Discharge status: Home Health | Payer: PPO | Source: Ambulatory Visit | Attending: Orthopedic Surgery | Admitting: Orthopedic Surgery

## 2014-09-02 ENCOUNTER — Encounter: Payer: Self-pay | Admitting: *Deleted

## 2014-09-02 ENCOUNTER — Encounter
Admission: RE | Disposition: A | Discharge status: Home Health | Payer: Self-pay | Source: Ambulatory Visit | Attending: Orthopedic Surgery

## 2014-09-02 DIAGNOSIS — Z806 Family history of leukemia: Secondary | ICD-10-CM

## 2014-09-02 DIAGNOSIS — Z808 Family history of malignant neoplasm of other organs or systems: Secondary | ICD-10-CM

## 2014-09-02 DIAGNOSIS — E785 Hyperlipidemia, unspecified: Secondary | ICD-10-CM | POA: Diagnosis present

## 2014-09-02 DIAGNOSIS — G2581 Restless legs syndrome: Secondary | ICD-10-CM | POA: Diagnosis present

## 2014-09-02 DIAGNOSIS — Z79891 Long term (current) use of opiate analgesic: Secondary | ICD-10-CM | POA: Diagnosis not present

## 2014-09-02 DIAGNOSIS — E039 Hypothyroidism, unspecified: Secondary | ICD-10-CM | POA: Diagnosis present

## 2014-09-02 DIAGNOSIS — Z6839 Body mass index (BMI) 39.0-39.9, adult: Secondary | ICD-10-CM | POA: Diagnosis not present

## 2014-09-02 DIAGNOSIS — Z87891 Personal history of nicotine dependence: Secondary | ICD-10-CM | POA: Diagnosis not present

## 2014-09-02 DIAGNOSIS — Z96651 Presence of right artificial knee joint: Secondary | ICD-10-CM | POA: Diagnosis present

## 2014-09-02 DIAGNOSIS — I4891 Unspecified atrial fibrillation: Secondary | ICD-10-CM | POA: Diagnosis present

## 2014-09-02 DIAGNOSIS — J449 Chronic obstructive pulmonary disease, unspecified: Secondary | ICD-10-CM | POA: Diagnosis present

## 2014-09-02 DIAGNOSIS — Z9049 Acquired absence of other specified parts of digestive tract: Secondary | ICD-10-CM | POA: Diagnosis present

## 2014-09-02 DIAGNOSIS — D649 Anemia, unspecified: Secondary | ICD-10-CM | POA: Diagnosis present

## 2014-09-02 DIAGNOSIS — D62 Acute posthemorrhagic anemia: Secondary | ICD-10-CM | POA: Diagnosis not present

## 2014-09-02 DIAGNOSIS — K219 Gastro-esophageal reflux disease without esophagitis: Secondary | ICD-10-CM | POA: Diagnosis present

## 2014-09-02 DIAGNOSIS — R509 Fever, unspecified: Secondary | ICD-10-CM

## 2014-09-02 DIAGNOSIS — Z853 Personal history of malignant neoplasm of breast: Secondary | ICD-10-CM

## 2014-09-02 DIAGNOSIS — F329 Major depressive disorder, single episode, unspecified: Secondary | ICD-10-CM | POA: Diagnosis present

## 2014-09-02 DIAGNOSIS — Z8249 Family history of ischemic heart disease and other diseases of the circulatory system: Secondary | ICD-10-CM

## 2014-09-02 DIAGNOSIS — F419 Anxiety disorder, unspecified: Secondary | ICD-10-CM | POA: Diagnosis present

## 2014-09-02 DIAGNOSIS — I509 Heart failure, unspecified: Secondary | ICD-10-CM | POA: Diagnosis present

## 2014-09-02 DIAGNOSIS — I10 Essential (primary) hypertension: Secondary | ICD-10-CM | POA: Diagnosis present

## 2014-09-02 DIAGNOSIS — I38 Endocarditis, valve unspecified: Secondary | ICD-10-CM | POA: Diagnosis present

## 2014-09-02 DIAGNOSIS — Z791 Long term (current) use of non-steroidal anti-inflammatories (NSAID): Secondary | ICD-10-CM | POA: Diagnosis not present

## 2014-09-02 DIAGNOSIS — Z7951 Long term (current) use of inhaled steroids: Secondary | ICD-10-CM | POA: Diagnosis not present

## 2014-09-02 DIAGNOSIS — M1712 Unilateral primary osteoarthritis, left knee: Principal | ICD-10-CM | POA: Diagnosis present

## 2014-09-02 DIAGNOSIS — E538 Deficiency of other specified B group vitamins: Secondary | ICD-10-CM | POA: Diagnosis present

## 2014-09-02 DIAGNOSIS — G709 Myoneural disorder, unspecified: Secondary | ICD-10-CM | POA: Diagnosis present

## 2014-09-02 DIAGNOSIS — Z96659 Presence of unspecified artificial knee joint: Secondary | ICD-10-CM

## 2014-09-02 DIAGNOSIS — M1990 Unspecified osteoarthritis, unspecified site: Secondary | ICD-10-CM | POA: Diagnosis present

## 2014-09-02 HISTORY — PX: TOTAL KNEE ARTHROPLASTY: SHX125

## 2014-09-02 SURGERY — ARTHROPLASTY, KNEE, TOTAL
Anesthesia: Spinal | Laterality: Left

## 2014-09-02 MED ORDER — METOPROLOL TARTRATE 50 MG PO TABS
50.0000 mg | ORAL_TABLET | Freq: Once | ORAL | Status: AC
  Administered 2014-09-02: 50 mg via ORAL

## 2014-09-02 MED ORDER — CEFAZOLIN SODIUM-DEXTROSE 2-3 GM-% IV SOLR
2.0000 g | Freq: Once | INTRAVENOUS | Status: AC
  Administered 2014-09-02: 2 g via INTRAVENOUS

## 2014-09-02 MED ORDER — TRAMADOL HCL 50 MG PO TABS
50.0000 mg | ORAL_TABLET | ORAL | Status: DC | PRN
  Administered 2014-09-02: 100 mg via ORAL
  Administered 2014-09-02 – 2014-09-03 (×2): 50 mg via ORAL
  Administered 2014-09-03: 100 mg via ORAL
  Administered 2014-09-04 (×2): 50 mg via ORAL
  Filled 2014-09-02: qty 2
  Filled 2014-09-02: qty 1
  Filled 2014-09-02: qty 2
  Filled 2014-09-02 (×3): qty 1

## 2014-09-02 MED ORDER — DIPHENHYDRAMINE HCL 25 MG PO TABS: 25.0000 mg | ORAL_TABLET | Freq: Every day | ORAL | Status: DC

## 2014-09-02 MED ORDER — LIDOCAINE HCL (CARDIAC) 20 MG/ML IV SOLN
INTRAVENOUS | Status: DC | PRN
  Administered 2014-09-02: 40 mg via INTRAVENOUS

## 2014-09-02 MED ORDER — DIPHENHYDRAMINE HCL 12.5 MG/5ML PO ELIX: 12.5000 mg | ORAL_SOLUTION | ORAL | Status: DC | PRN

## 2014-09-02 MED ORDER — MIDAZOLAM HCL 2 MG/2ML IJ SOLN
INTRAMUSCULAR | Status: DC | PRN
  Administered 2014-09-02: 1 mg via INTRAVENOUS
  Administered 2014-09-02: 2 mg via INTRAVENOUS

## 2014-09-02 MED ORDER — ALUM & MAG HYDROXIDE-SIMETH 200-200-20 MG/5ML PO SUSP: 30.0000 mL | ORAL | Status: DC | PRN

## 2014-09-02 MED ORDER — LACTATED RINGERS IV SOLN
INTRAVENOUS | Status: DC
  Administered 2014-09-02: 11:00:00 via INTRAVENOUS

## 2014-09-02 MED ORDER — ZOLPIDEM TARTRATE 5 MG PO TABS
5.0000 mg | ORAL_TABLET | Freq: Every day | ORAL | Status: DC
  Administered 2014-09-02 – 2014-09-04 (×3): 5 mg via ORAL
  Filled 2014-09-02 (×3): qty 1

## 2014-09-02 MED ORDER — MENTHOL 3 MG MT LOZG
1.0000 | LOZENGE | OROMUCOSAL | Status: DC | PRN
  Filled 2014-09-02: qty 9

## 2014-09-02 MED ORDER — ACETAMINOPHEN 325 MG PO TABS
650.0000 mg | ORAL_TABLET | Freq: Four times a day (QID) | ORAL | Status: DC | PRN
  Administered 2014-09-04 – 2014-09-05 (×2): 650 mg via ORAL
  Filled 2014-09-02 (×2): qty 2

## 2014-09-02 MED ORDER — PRAMIPEXOLE DIHYDROCHLORIDE 0.25 MG PO TABS
0.2500 mg | ORAL_TABLET | Freq: Every day | ORAL | Status: DC
  Administered 2014-09-03 – 2014-09-04 (×2): 0.25 mg via ORAL
  Filled 2014-09-02 (×2): qty 1

## 2014-09-02 MED ORDER — KETAMINE HCL 50 MG/ML IJ SOLN
INTRAMUSCULAR | Status: DC | PRN
  Administered 2014-09-02: 25 mg via INTRAMUSCULAR

## 2014-09-02 MED ORDER — BUPIVACAINE HCL (PF) 0.5 % IJ SOLN
INTRAMUSCULAR | Status: DC | PRN
  Administered 2014-09-02: 15 mg

## 2014-09-02 MED ORDER — ACETAMINOPHEN 10 MG/ML IV SOLN
INTRAVENOUS | Status: DC | PRN
  Administered 2014-09-02: 1000 mg via INTRAVENOUS

## 2014-09-02 MED ORDER — BUPIVACAINE LIPOSOME 1.3 % IJ SUSP
INTRAMUSCULAR | Status: DC | PRN
  Administered 2014-09-02: 60 mL

## 2014-09-02 MED ORDER — BUPIVACAINE-EPINEPHRINE 0.25% -1:200000 IJ SOLN
INTRAMUSCULAR | Status: DC | PRN
  Administered 2014-09-02: 30 mL

## 2014-09-02 MED ORDER — PHENOL 1.4 % MT LIQD: 1.0000 | OROMUCOSAL | Status: DC | PRN

## 2014-09-02 MED ORDER — METOPROLOL SUCCINATE ER 50 MG PO TB24
50.0000 mg | ORAL_TABLET | ORAL | Status: DC
  Administered 2014-09-03 – 2014-09-05 (×3): 50 mg via ORAL
  Filled 2014-09-02 (×3): qty 1

## 2014-09-02 MED ORDER — VANCOMYCIN HCL IN DEXTROSE 1-5 GM/200ML-% IV SOLN
1000.0000 mg | Freq: Once | INTRAVENOUS | Status: AC
  Administered 2014-09-02: 1000 mg via INTRAVENOUS

## 2014-09-02 MED ORDER — NEOMYCIN-POLYMYXIN B GU 40-200000 IR SOLN
Status: DC | PRN
  Administered 2014-09-02: 14 mL/h

## 2014-09-02 MED ORDER — METOCLOPRAMIDE HCL 10 MG PO TABS
10.0000 mg | ORAL_TABLET | Freq: Three times a day (TID) | ORAL | Status: AC
  Administered 2014-09-02 – 2014-09-04 (×8): 10 mg via ORAL
  Filled 2014-09-02 (×8): qty 1

## 2014-09-02 MED ORDER — SODIUM CHLORIDE 0.9 % IV SOLN
INTRAVENOUS | Status: DC
  Administered 2014-09-02 – 2014-09-03 (×2): via INTRAVENOUS

## 2014-09-02 MED ORDER — CEFAZOLIN SODIUM-DEXTROSE 2-3 GM-% IV SOLR
2.0000 g | Freq: Four times a day (QID) | INTRAVENOUS | Status: AC
  Administered 2014-09-02 – 2014-09-03 (×4): 2 g via INTRAVENOUS
  Filled 2014-09-02 (×6): qty 50

## 2014-09-02 MED ORDER — ANASTROZOLE 1 MG PO TABS
1.0000 mg | ORAL_TABLET | Freq: Every day | ORAL | Status: DC
  Administered 2014-09-02 – 2014-09-04 (×3): 1 mg via ORAL
  Filled 2014-09-02 (×4): qty 1

## 2014-09-02 MED ORDER — ONDANSETRON HCL 4 MG PO TABS: 4.0000 mg | ORAL_TABLET | Freq: Four times a day (QID) | ORAL | Status: DC | PRN

## 2014-09-02 MED ORDER — PROPOFOL INFUSION 10 MG/ML OPTIME
INTRAVENOUS | Status: DC | PRN
  Administered 2014-09-02: 75 ug/kg/min via INTRAVENOUS

## 2014-09-02 MED ORDER — PANTOPRAZOLE SODIUM 40 MG PO TBEC
40.0000 mg | DELAYED_RELEASE_TABLET | Freq: Two times a day (BID) | ORAL | Status: DC
  Administered 2014-09-02 – 2014-09-05 (×6): 40 mg via ORAL
  Filled 2014-09-02 (×6): qty 1

## 2014-09-02 MED ORDER — MONTELUKAST SODIUM 10 MG PO TABS
10.0000 mg | ORAL_TABLET | Freq: Every day | ORAL | Status: DC
  Administered 2014-09-02 – 2014-09-04 (×3): 10 mg via ORAL
  Filled 2014-09-02 (×3): qty 1

## 2014-09-02 MED ORDER — ACETAMINOPHEN 650 MG RE SUPP: 650.0000 mg | Freq: Four times a day (QID) | RECTAL | Status: DC | PRN

## 2014-09-02 MED ORDER — OXYBUTYNIN CHLORIDE 5 MG PO TABS
5.0000 mg | ORAL_TABLET | Freq: Three times a day (TID) | ORAL | Status: DC | PRN
  Filled 2014-09-02: qty 1

## 2014-09-02 MED ORDER — SENNOSIDES-DOCUSATE SODIUM 8.6-50 MG PO TABS
1.0000 | ORAL_TABLET | Freq: Two times a day (BID) | ORAL | Status: DC
  Administered 2014-09-02 – 2014-09-04 (×3): 1 via ORAL
  Filled 2014-09-02 (×3): qty 1

## 2014-09-02 MED ORDER — FERROUS SULFATE 325 (65 FE) MG PO TABS
325.0000 mg | ORAL_TABLET | Freq: Two times a day (BID) | ORAL | Status: DC
  Administered 2014-09-02 – 2014-09-05 (×6): 325 mg via ORAL
  Filled 2014-09-02 (×6): qty 1

## 2014-09-02 MED ORDER — ACETAMINOPHEN 10 MG/ML IV SOLN
1000.0000 mg | Freq: Four times a day (QID) | INTRAVENOUS | Status: AC
  Administered 2014-09-02 – 2014-09-03 (×4): 1000 mg via INTRAVENOUS
  Filled 2014-09-02 (×4): qty 100

## 2014-09-02 MED ORDER — CYCLOBENZAPRINE HCL 10 MG PO TABS: 10.0000 mg | ORAL_TABLET | Freq: Two times a day (BID) | ORAL | Status: DC | PRN

## 2014-09-02 MED ORDER — FENTANYL CITRATE (PF) 100 MCG/2ML IJ SOLN
25.0000 ug | INTRAMUSCULAR | Status: DC | PRN
  Administered 2014-09-02 (×4): 25 ug via INTRAVENOUS

## 2014-09-02 MED ORDER — CLONAZEPAM 1 MG PO TABS
1.0000 mg | ORAL_TABLET | Freq: Every day | ORAL | Status: DC
  Administered 2014-09-02 – 2014-09-04 (×3): 1 mg via ORAL
  Filled 2014-09-02 (×3): qty 1

## 2014-09-02 MED ORDER — FLUOXETINE HCL 20 MG PO CAPS
40.0000 mg | ORAL_CAPSULE | Freq: Every day | ORAL | Status: DC
Start: 2014-09-02 — End: 2014-09-05
  Administered 2014-09-03 – 2014-09-05 (×3): 40 mg via ORAL
  Filled 2014-09-02 (×3): qty 2

## 2014-09-02 MED ORDER — BISACODYL 10 MG RE SUPP: 10.0000 mg | Freq: Every day | RECTAL | Status: DC | PRN

## 2014-09-02 MED ORDER — MAGNESIUM HYDROXIDE 400 MG/5ML PO SUSP: 30.0000 mL | Freq: Every day | ORAL | Status: DC | PRN

## 2014-09-02 MED ORDER — ENOXAPARIN SODIUM 30 MG/0.3ML ~~LOC~~ SOLN
30.0000 mg | Freq: Two times a day (BID) | SUBCUTANEOUS | Status: DC
  Administered 2014-09-03 – 2014-09-05 (×5): 30 mg via SUBCUTANEOUS
  Filled 2014-09-02 (×5): qty 0.3

## 2014-09-02 MED ORDER — ONDANSETRON HCL 4 MG/2ML IJ SOLN: 4.0000 mg | Freq: Once | INTRAMUSCULAR | Status: DC | PRN

## 2014-09-02 MED ORDER — ONDANSETRON HCL 4 MG/2ML IJ SOLN: 4.0000 mg | Freq: Four times a day (QID) | INTRAMUSCULAR | Status: DC | PRN

## 2014-09-02 MED ORDER — FENTANYL CITRATE (PF) 100 MCG/2ML IJ SOLN
INTRAMUSCULAR | Status: AC
  Administered 2014-09-02: 25 ug via INTRAVENOUS
  Filled 2014-09-02: qty 2

## 2014-09-02 MED ORDER — MORPHINE SULFATE 2 MG/ML IJ SOLN: 2.0000 mg | INTRAMUSCULAR | Status: DC | PRN

## 2014-09-02 MED ORDER — GABAPENTIN 400 MG PO CAPS
1200.0000 mg | ORAL_CAPSULE | Freq: Two times a day (BID) | ORAL | Status: DC
  Administered 2014-09-02 – 2014-09-05 (×6): 1200 mg via ORAL
  Filled 2014-09-02 (×6): qty 3

## 2014-09-02 MED ORDER — PRAMIPEXOLE DIHYDROCHLORIDE 0.25 MG PO TABS
0.5000 mg | ORAL_TABLET | Freq: Two times a day (BID) | ORAL | Status: DC
Start: 2014-09-02 — End: 2014-09-05
  Administered 2014-09-02 – 2014-09-05 (×6): 0.5 mg via ORAL
  Filled 2014-09-02 (×6): qty 2

## 2014-09-02 MED ORDER — LEVOTHYROXINE SODIUM 25 MCG PO TABS
25.0000 ug | ORAL_TABLET | Freq: Every day | ORAL | Status: DC
  Administered 2014-09-03 – 2014-09-05 (×3): 25 ug via ORAL
  Filled 2014-09-02 (×3): qty 1

## 2014-09-02 MED ORDER — FAMOTIDINE 20 MG PO TABS
20.0000 mg | ORAL_TABLET | Freq: Once | ORAL | Status: AC
Start: 2014-09-02 — End: 2014-09-02
  Administered 2014-09-02: 20 mg via ORAL

## 2014-09-02 MED ORDER — OXYCODONE HCL 5 MG PO TABS: 5.0000 mg | ORAL_TABLET | ORAL | Status: DC | PRN

## 2014-09-02 MED ORDER — FLEET ENEMA 7-19 GM/118ML RE ENEM: 1.0000 | ENEMA | Freq: Once | RECTAL | Status: AC | PRN

## 2014-09-02 MED ORDER — METOPROLOL TARTRATE 50 MG PO TABS
ORAL_TABLET | ORAL | Status: AC
  Administered 2014-09-02: 50 mg via ORAL
  Filled 2014-09-02: qty 1

## 2014-09-02 MED ORDER — FUROSEMIDE 40 MG PO TABS
40.0000 mg | ORAL_TABLET | Freq: Every day | ORAL | Status: DC
Start: 2014-09-02 — End: 2014-09-05
  Administered 2014-09-03: 40 mg via ORAL
  Filled 2014-09-02 (×2): qty 1

## 2014-09-02 SURGICAL SUPPLY — 58 items
AUTOTRANSFUS HAS 1/8 (MISCELLANEOUS) ×3
BATTERY INSTRU NAVIGATION (MISCELLANEOUS) ×12 IMPLANT
BLADE SAW 1 (BLADE) ×3 IMPLANT
BLADE SAW 1/2 (BLADE) ×3 IMPLANT
CANISTER SUCT 1200ML W/VALVE (MISCELLANEOUS) ×3 IMPLANT
CANISTER SUCT 3000ML (MISCELLANEOUS) ×6 IMPLANT
CAP KNEE TOTAL 3 SIGMA ×3 IMPLANT
CATH TRAY 16F METER LATEX (MISCELLANEOUS) ×3 IMPLANT
CEMENT HV SMART SET (Cement) ×6 IMPLANT
DECANTER SPIKE VIAL GLASS SM (MISCELLANEOUS) ×6 IMPLANT
DRAPE SHEET LG 3/4 BI-LAMINATE (DRAPES) ×3 IMPLANT
DRSG DERMACEA 8X12 NADH (GAUZE/BANDAGES/DRESSINGS) ×3 IMPLANT
DRSG OPSITE POSTOP 4X14 (GAUZE/BANDAGES/DRESSINGS) ×3 IMPLANT
DURAPREP 26ML APPLICATOR (WOUND CARE) ×6 IMPLANT
ELECT CAUTERY BLADE 6.4 (BLADE) ×3 IMPLANT
EX-PIN ORTHOLOCK NAV 4X150 (PIN) ×6 IMPLANT
GLOVE BIOGEL M STRL SZ7.5 (GLOVE) ×6 IMPLANT
GLOVE INDICATOR 8.0 STRL GRN (GLOVE) ×3 IMPLANT
GLOVE SURG 9.0 ORTHO LTXF (GLOVE) ×3 IMPLANT
GLOVE SURG ORTHO 9.0 STRL STRW (GLOVE) ×3 IMPLANT
GOWN STRL REUS W/ TWL LRG LVL3 (GOWN DISPOSABLE) ×1 IMPLANT
GOWN STRL REUS W/TWL 2XL LVL3 (GOWN DISPOSABLE) ×3 IMPLANT
GOWN STRL REUS W/TWL LRG LVL3 (GOWN DISPOSABLE) ×2
GOWN STRL REUS W/TWL XL LVL4 (GOWN DISPOSABLE) ×3 IMPLANT
HANDPIECE SUCTION TUBG SURGILV (MISCELLANEOUS) ×3 IMPLANT
HOLDER FOLEY CATH W/STRAP (MISCELLANEOUS) ×3 IMPLANT
HOOD PEEL AWAY FACE SHEILD DIS (HOOD) ×6 IMPLANT
KIT RM TURNOVER STRD PROC AR (KITS) ×3 IMPLANT
KNIFE SCULPS 14X20 (INSTRUMENTS) ×3 IMPLANT
NDL SAFETY 18GX1.5 (NEEDLE) ×3 IMPLANT
NEEDLE SPNL 20GX3.5 QUINCKE YW (NEEDLE) ×3 IMPLANT
NS IRRIG 500ML POUR BTL (IV SOLUTION) ×3 IMPLANT
PACK TOTAL KNEE (MISCELLANEOUS) ×3 IMPLANT
PAD GROUND ADULT SPLIT (MISCELLANEOUS) ×3 IMPLANT
PAD WRAPON POLAR ANKLE (MISCELLANEOUS) ×1 IMPLANT
PAD WRAPON POLAR KNEE (MISCELLANEOUS) ×1 IMPLANT
PIN DRILL QUICK PACK ×3 IMPLANT
PIN FIXATION 1/8DIA X 3INL (PIN) ×3 IMPLANT
SOL .9 NS 3000ML IRR  AL (IV SOLUTION) ×2
SOL .9 NS 3000ML IRR UROMATIC (IV SOLUTION) ×1 IMPLANT
SOL PREP PVP 2OZ (MISCELLANEOUS) ×3
SOLUTION PREP PVP 2OZ (MISCELLANEOUS) ×1 IMPLANT
SPONGE DRAIN TRACH 4X4 STRL 2S (GAUZE/BANDAGES/DRESSINGS) ×3 IMPLANT
STAPLER SKIN PROX 35W (STAPLE) ×3 IMPLANT
STRAP SAFETY BODY (MISCELLANEOUS) IMPLANT
SUCTION FRAZIER TIP 10 FR DISP (SUCTIONS) ×3 IMPLANT
SUT VIC AB 0 CT1 36 (SUTURE) ×3 IMPLANT
SUT VIC AB 1 CT1 36 (SUTURE) ×6 IMPLANT
SUT VIC AB 2-0 CT2 27 (SUTURE) ×3 IMPLANT
SYR 20CC LL (SYRINGE) ×3 IMPLANT
SYR 30ML LL (SYRINGE) ×3 IMPLANT
SYR 50ML LL SCALE MARK (SYRINGE) ×3 IMPLANT
SYSTEM AUTOTRANSFUS DUAL TROCR (MISCELLANEOUS) ×1 IMPLANT
TOWEL OR 17X26 4PK STRL BLUE (TOWEL DISPOSABLE) ×3 IMPLANT
TOWER CARTRIDGE SMART MIX (DISPOSABLE) ×3 IMPLANT
WATER STERILE IRR 1000ML POUR (IV SOLUTION) ×3 IMPLANT
WRAPON POLAR PAD ANKLE (MISCELLANEOUS) ×3
WRAPON POLAR PAD KNEE (MISCELLANEOUS) ×3

## 2014-09-02 NOTE — Anesthesia Preprocedure Evaluation (Signed)
Anesthesia Evaluation  Patient identified by MRN, date of birth, ID band Patient awake    Reviewed: Allergy & Precautions, NPO status , Patient's Chart, lab work & pertinent test results  History of Anesthesia Complications Negative for: history of anesthetic complications  Airway Mallampati: II  TM Distance: >3 FB Neck ROM: Full    Dental  (+) Partial Upper   Pulmonary COPDformer smoker (quit x 22 yrs),          Cardiovascular hypertension, Pt. on medications and Pt. on home beta blockers +CHF (? dx at another facility) + dysrhythmias Atrial Fibrillation     Neuro/Psych Anxiety Depression  Neuromuscular disease (vit B12 deficiency pain problems)    GI/Hepatic GERD-  Medicated and Controlled,  Endo/Other  Hypothyroidism   Renal/GU      Musculoskeletal  (+) Arthritis -, Osteoarthritis,    Abdominal   Peds  Hematology   Anesthesia Other Findings   Reproductive/Obstetrics                             Anesthesia Physical Anesthesia Plan  ASA: III  Anesthesia Plan: Spinal   Post-op Pain Management:    Induction: Intravenous  Airway Management Planned:   Additional Equipment:   Intra-op Plan:   Post-operative Plan:   Informed Consent: I have reviewed the patients History and Physical, chart, labs and discussed the procedure including the risks, benefits and alternatives for the proposed anesthesia with the patient or authorized representative who has indicated his/her understanding and acceptance.     Plan Discussed with:   Anesthesia Plan Comments:         Anesthesia Quick Evaluation

## 2014-09-02 NOTE — Anesthesia Procedure Notes (Signed)
Spinal Patient location during procedure: OR Start time: 09/02/2014 11:23 AM End time: 09/02/2014 11:35 AM Staffing Resident/CRNA: Jermiah Soderman Performed by: resident/CRNA  Preanesthetic Checklist Completed: patient identified, surgical consent, pre-op evaluation, timeout performed, IV checked, risks and benefits discussed and monitors and equipment checked Spinal Block Patient position: sitting Prep: Betadine Patient monitoring: heart rate, continuous pulse ox and blood pressure Approach: midline Location: L3-4 Injection technique: single-shot Needle Needle type: Whitacre  Needle gauge: 24 G Needle length: 9 cm

## 2014-09-02 NOTE — Op Note (Signed)
OPERATIVE NOTE  DATE OF SURGERY:  09/02/2014  PATIENT NAME:  SUNNY AGUON   DOB: [birth year]  MRN: [id number]  PRE-OPERATIVE DIAGNOSIS: Degenerative arthrosis of the left knee, primary  POST-OPERATIVE DIAGNOSIS:  Same  PROCEDURE:  Left total knee arthroplasty using computer-assisted navigation  SURGEON:  Marciano Sequin. M.D.  ASSISTANT:  Rachelle Hora, PA-C (present and scrubbed throughout the case, critical for assistance with exposure, retraction, instrumentation, and closure)  ANESTHESIA: spinal  ESTIMATED BLOOD LOSS: 75 mL  FLUIDS REPLACED: 1000 mL of crystalloid  TOURNIQUET TIME: 86 minutes  DRAINS: 2 medium drains to a reinfusion system  SOFT TISSUE RELEASES: Anterior cruciate ligament, posterior cruciate ligament, deep medial collateral ligament, patellofemoral ligament   IMPLANTS UTILIZED: DePuy PFC Sigma size 3 posterior stabilized femoral component (cemented), size 3 MBT tibial component (cemented), 35 mm 3 peg oval dome patella (cemented), and a 10 mm stabilized rotating platform polyethylene insert.  INDICATIONS FOR SURGERY: ULAH OLMO is a 64 y.o. year old female with a long history of progressive knee pain. X-rays demonstrated severe degenerative changes in tricompartmental fashion. The patient had not seen any significant improvement despite conservative nonsurgical intervention. After discussion of the risks and benefits of surgical intervention, the patient expressed understanding of the risks benefits and agree with plans for total knee arthroplasty.   The risks, benefits, and alternatives were discussed at length including but not limited to the risks of infection, bleeding, nerve injury, stiffness, blood clots, the need for revision surgery, cardiopulmonary complications, among others, and they were willing to proceed.  PROCEDURE IN DETAIL: The patient was brought into the operating room and, after adequate spinal anesthesia was achieved, a tourniquet was  placed on the patient's upper thigh. The patient's knee and leg were cleaned and prepped with alcohol and DuraPrep and draped in the usual sterile fashion. A "timeout" was performed as per usual protocol. The lower extremity was exsanguinated using an Esmarch, and the tourniquet was inflated to 300 mmHg. An anterior longitudinal incision was made followed by a standard mid vastus approach. The deep fibers of the medial collateral ligament were elevated in a subperiosteal fashion off of the medial flare of the tibia so as to maintain a continuous soft tissue sleeve. The patella was subluxed laterally and the patellofemoral ligament was incised. Inspection of the knee demonstrated severe degenerative changes with full-thickness loss of articular cartilage. Osteophytes were debrided using a rongeur. Anterior and posterior cruciate ligaments were excised. Two 4.0 mm Schanz pins were inserted in the femur and into the tibia for attachment of the array of trackers used for computer-assisted navigation. Hip center was identified using a circumduction technique. Distal landmarks were mapped using the computer. The distal femur and proximal tibia were mapped using the computer. The distal femoral cutting guide was positioned using computer-assisted navigation so as to achieve a 5 distal valgus cut. The femur was sized and it was felt that a size 3 femoral component was appropriate. A size 3 femoral cutting guide was positioned and the anterior cut was performed and verified using the computer. This was followed by completion of the posterior and chamfer cuts. Femoral cutting guide for the central box was then positioned in the center box cut was performed.  Attention was then directed to the proximal tibia. Medial and lateral menisci were excised. The extramedullary tibial cutting guide was positioned using computer-assisted navigation so as to achieve a 0 varus-valgus alignment and 0 posterior slope. The cut was  performed and verified  using the computer. The proximal tibia was sized and it was felt that a size 3 tibial tray was appropriate. Tibial and femoral trials were inserted followed by insertion of a 10 mm polyethylene insert. This allowed for excellent mediolateral soft tissue balancing both in flexion and in full extension. Finally, the patella was cut and prepared so as to accommodate a 35 mm 3 peg oval dome patella. A patella trial was placed and the knee was placed through a range of motion with excellent patellar tracking appreciated. The femoral trial was removed after debridement of posterior osteophytes. The central post-hole for the tibial component was reamed followed by insertion of a keel punch. Tibial trials were then removed. Cut surfaces of bone were irrigated with copious amounts of normal saline with antibiotic solution using pulsatile lavage and then suctioned dry. Polymethylmethacrylate cement with gentamicin was prepared in the usual fashion using a vacuum mixer. Cement was applied to the cut surface of the proximal tibia as well as along the undersurface of a size 3 MBT tibial component. Tibial component was positioned and impacted into place. Excess cement was removed using [employer] fast streamer. Cement was then applied to the cut surfaces of the femur as well as along the posterior flanges of the size 3 femoral component. The femoral component was positioned and impacted into place. Excess cement was removed using [employer] fast streamer. A 10 mm polyethylene trial was inserted and the knee was brought into full extension with steady axial compression applied. Finally, cement was applied to the backside of a 35 mm 3 peg oval dome patella and the patellar component was positioned and patellar clamp applied. Excess cement was removed using [employer] fast streamer. After adequate curing of the cement, the tourniquet was deflated after a total tourniquet time of 86 minutes. Hemostasis was achieved using electrocautery.  The knee was irrigated with copious amounts of normal saline with antibiotic solution using pulsatile lavage and then suctioned dry. 20 mL of 1.3% Exparel in 40 mL of normal saline was injected along the posterior capsule, medial and lateral gutters, and along the arthrotomy site. A 10 mm stabilized rotating platform polyethylene insert was inserted and the knee was placed through a range of motion with excellent mediolateral soft tissue balancing appreciated and excellent patellar tracking noted. 2 medium drains were placed in the wound bed and brought out through separate stab incisions to be attached to a reinfusion system. The medial parapatellar portion of the incision was reapproximated using interrupted sutures of #1 Vicryl. Subcutaneous tissue was then injected with a total of 30 cc of 0.25% Marcaine with epinephrine. Subcutaneous tissue was approximated in layers using first #0 Vicryl followed #2-0 Vicryl. The skin was approximated with skin staples. A sterile dressing was applied.  The patient tolerated the procedure well and was transported to the recovery room in stable condition.    James P. Holley Bouche., M.D.

## 2014-09-02 NOTE — Brief Op Note (Signed)
09/02/2014  3:03 PM  PATIENT:  Tonya Walton  64 y.o. female  PRE-OPERATIVE DIAGNOSIS:  LEFT KNEE DEGENERATIVE ARTHROSIS  POST-OPERATIVE DIAGNOSIS:  same  PROCEDURE:  Procedure(s): TOTAL KNEE ARTHROPLASTY (Left) using computer-assisted navigation  SURGEON:  Surgeon(s) and Role:    * Dereck Leep, MD - Primary  ASSISTANTS: Rachelle Hora, PA-C   ANESTHESIA:   spinal  EBL:  Total I/O In: 1000 [I.V.:1000] Out: 607 [Urine:400; Blood:75]  BLOOD ADMINISTERED:none  DRAINS: 2 drains to reinfusion system   LOCAL MEDICATIONS USED:  MARCAINE    and OTHER Exparel  SPECIMEN:  No Specimen  DISPOSITION OF SPECIMEN:  N/A  COUNTS:  YES  TOURNIQUET:  86 min  DICTATION: .Dragon Dictation  PLAN OF CARE: Admit to inpatient   PATIENT DISPOSITION:  PACU - hemodynamically stable.   Delay start of Pharmacological VTE agent (>24hrs) due to surgical blood loss or risk of bleeding: yes

## 2014-09-02 NOTE — Transfer of Care (Signed)
Immediate Anesthesia Transfer of Care Note  Patient: Tonya Walton  Procedure(s) Performed: Procedure(s): TOTAL KNEE ARTHROPLASTY (Left)  Patient Location: PACU  Anesthesia Type:Spinal  Level of Consciousness: awake, alert  and oriented  Airway & Oxygen Therapy: Patient Spontanous Breathing and Patient connected to face mask oxygen  Post-op Assessment: Report given to RN and Post -op Vital signs reviewed and stable  Post vital signs: Reviewed and stable  Last Vitals:  Filed Vitals:   09/02/14 1506  BP: 132/67  Pulse: 58  Temp: 36.7 C  Resp: 12    Complications: No apparent anesthesia complications

## 2014-09-02 NOTE — H&P (Signed)
The patient has been re-examined, and the chart reviewed, and there have been no interval changes to the documented history and physical.    The risks, benefits, and alternatives have been discussed at length. The patient expressed understanding of the risks benefits and agreed with plans for surgical intervention.  James P. Hooten, Jr. M.D.    

## 2014-09-03 ENCOUNTER — Encounter: Payer: Self-pay | Admitting: Orthopedic Surgery

## 2014-09-03 LAB — BASIC METABOLIC PANEL
ANION GAP: 8 (ref 5–15)
BUN: 9 mg/dL (ref 6–20)
CO2: 27 mmol/L (ref 22–32)
Calcium: 8.8 mg/dL — ABNORMAL LOW (ref 8.9–10.3)
Chloride: 108 mmol/L (ref 101–111)
Creatinine, Ser: 0.67 mg/dL (ref 0.44–1.00)
GFR calc Af Amer: >60 mL/min (ref >60)
GFR calc non Af Amer: >60 mL/min (ref >60)
GLUCOSE: 107 mg/dL — AB (ref 65–99)
Potassium: 4.1 mmol/L (ref 3.5–5.1)
Sodium: 143 mmol/L (ref 135–145)

## 2014-09-03 LAB — C DIFFICILE QUICK SCREEN W PCR REFLEX
C DIFFICILE (CDIFF) INTERP: NEGATIVE
C Diff antigen: NEGATIVE
C Diff toxin: NEGATIVE

## 2014-09-03 LAB — CBC
HCT: 34.1 % — ABNORMAL LOW (ref 35.0–47.0)
Hemoglobin: 11.3 g/dL — ABNORMAL LOW (ref 12.0–16.0)
MCH: 28.7 pg (ref 26.0–34.0)
MCHC: 33.1 g/dL (ref 32.0–36.0)
MCV: 86.8 fL (ref 80.0–100.0)
Platelets: 194 10*3/uL (ref 150–440)
RBC: 3.93 MIL/uL (ref 3.80–5.20)
RDW: 15.1 % — ABNORMAL HIGH (ref 11.5–14.5)
WBC: 6.5 10*3/uL (ref 3.6–11.0)

## 2014-09-03 NOTE — Evaluation (Signed)
Occupational Therapy Evaluation Patient Details Name: Tonya Walton MRN: [id number] DOB: 03-Nov-1950 Today's Date: 09/03/2014    History of Present Illness This patient is a 64 year old female who came to [facility] for a L TKR. She had a R TKR a few months ago.   Clinical Impression   This patient is a 64 year old female who came to [facility] for a L total knee replacement.  Patient lives in a one story home with her husband.   She had been independent with ADL and functional mobility. She now requires assistance and would benefit from Occupational Therapy for ADL/functioal mobility training.      Follow Up Recommendations   Home with home health, most likely will not need Occupational Therapy after discharge.    Equipment Recommendations       Recommendations for Other Services       Precautions / Restrictions Precautions Precautions: Knee Restrictions Weight Bearing Restrictions: Yes LLE Weight Bearing: Weight bearing as tolerated      Mobility Bed Mobility                  Transfers                      Balance                                            ADL                                         General ADL Comments: Was indepenent now needs assist. Patient practiced Donned/doffed socks and pants to knees (drain still in place) with set up, verbal cues and minimal assist.      Vision     Perception     Praxis      Pertinent Vitals/Pain Pain Assessment: 0-10 Pain Score: 0-No pain     Hand Dominance Right   Extremity/Trunk Assessment Upper Extremity Assessment Upper Extremity Assessment:  (B UE 5/5)           Communication Communication Communication: No difficulties   Cognition Arousal/Alertness: Awake/alert Behavior During Therapy: WFL for tasks assessed/performed Overall Cognitive Status: Within Functional Limits for tasks assessed                      General Comments       Exercises       Shoulder Instructions      Home Living Family/patient expects to be discharged to:: Private residence Living Arrangements: Spouse/significant other Available Help at Discharge: Family Type of Home: House Home Access: Stairs to enter Technical brewer of Steps: 1   Home Layout: One level     Bathroom Shower/Tub: Tub/shower unit         Home Equipment: Cane - single point;Walker - 2 wheels;Bedside commode          Prior Functioning/Environment Level of Independence: Independent             OT Diagnosis:     OT Problem List:     OT Treatment/Interventions:      OT Goals(Current goals can be found in the care plan section)    OT Frequency:     Barriers to D/C:  Co-evaluation              End of Session    Activity Tolerance:   Patient left:     Time:  -    Charges:    G-Codes:    Myrene Galas, MS/OTR/L  09/03/2014, 11:05 AM

## 2014-09-03 NOTE — Progress Notes (Signed)
   Subjective: 1 Day Post-Op Procedure(s) (LRB): TOTAL KNEE ARTHROPLASTY (Left) Patient reports pain as 2 on 0-10 scale.   Patient is well, and has had no acute complaints or problems We will start therapy today.  Plan is to go Home after hospital stay.  Objective: Vital signs in last 24 hours: Temp:  [97.6 F (36.4 C)-98.6 F (37 C)] 98 F (36.7 C) (08/04 0800) Pulse Rate:  [56-77] 64 (08/04 0800) Resp:  [12-20] 18 (08/04 0618) BP: (121-157)/(47-132) 121/67 mmHg (08/04 0800) SpO2:  [97 %-100 %] 100 % (08/04 0800) Weight:  [104.327 kg (230 lb)-112.22 kg (247 lb 6.4 oz)] 112.22 kg (247 lb 6.4 oz) (08/03 1642)  Intake/Output from previous day: 08/03 0701 - 08/04 0700 In: 1563.3 [I.V.:1213.3; Blood:200; IV Piggyback:150] Out: 8127 [Urine:4400; Drains:1700; Blood:75] Intake/Output this shift: Total I/O In: 200 [IV Piggyback:200] Out: 450 [Urine:450]   Recent Labs  09/03/14 0425  HGB 11.3*    Recent Labs  09/03/14 0425  WBC 6.5  RBC 3.93  HCT 34.1*  PLT 194    Recent Labs  09/03/14 0425  NA 143  K 4.1  CL 108  CO2 27  BUN 9  CREATININE 0.67  GLUCOSE 107*  CALCIUM 8.8*   No results for input(s): LABPT, INR in the last 72 hours.  EXAM General - Patient is Alert, Appropriate and Oriented Extremity - Neurovascular intact Sensation intact distally Intact pulses distally Dorsiflexion/Plantar flexion intact able to SLR. Dressing - dressing C/D/I and no drainage Motor Function - intact, moving foot and toes well on exam.   Past Medical History  Diagnosis Date  . Anxiety   . Depression   . Dysrhythmia     Afib  . CHF (congestive heart failure)   . Hypertension   . COPD (chronic obstructive pulmonary disease)   . Hypothyroidism   . GERD (gastroesophageal reflux disease)   . Headache   . 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 Arthritis   . Cancer     right breast  . Atrial fibrillation, controlled   . Shortness of breath dyspnea   .  Restless leg syndrome     Assessment/Plan:   1 Day Post-Op Procedure(s) (LRB): TOTAL KNEE ARTHROPLASTY (Left) Active Problems:   Total knee replacement status   Acute post op blood loss anemia with underlying chronic anemia    Estimated body mass index is 39.95 kg/(m^2) as calculated from the following:   Height as of this encounter: 5\' 6"  (1.676 m).   Weight as of this encounter: 112.22 kg (247 lb 6.4 oz). Advance diet Up with therapy  Recheck labs in the am Needs BM   DVT Prophylaxis - Lovenox, Foot Pumps and TED hose Weight-Bearing as tolerated to left leg D/C O2 and Pulse OX and try on Room Air  T. Rachelle Hora, PA-C [place] 09/03/2014, 8:10 AM

## 2014-09-03 NOTE — Progress Notes (Signed)
Physical Therapy Treatment Patient Details Name: Tonya Walton MRN: [id number] DOB: 22-May-1950 Today's Date: 09/03/2014    History of Present Illness Pt admitted to the hospital s/p L TKR. Pt has recent hx of R TKR performed by same surgeon here at [facility]    PT Comments    Pt continued up in chair since the a.m. Pt notes she wishes back to bed post session. Pt progressing well with ambulation. Initiated stand exercises and continued with bed exercises. Pt compliant with performing exercises throughout the day. Continue PT for progression of L knee active/active assisted range of motion, strength, balance and quality of ambulation to improve functional mobility.   Follow Up Recommendations  Home health PT     Equipment Recommendations  None recommended by PT    Recommendations for Other Services       Precautions / Restrictions Precautions Precautions: Knee Precaution Booklet Issued: No Restrictions Weight Bearing Restrictions: Yes LLE Weight Bearing: Weight bearing as tolerated    Mobility  Bed Mobility Overal bed mobility: Modified Independent Bed Mobility: Sit to Supine     Supine to sit: Min assist Sit to supine: Modified independent (Device/Increase time)   General bed mobility comments: Repositions in bed with use of rails  Transfers Overall transfer level: Needs assistance Equipment used: Rolling walker (2 wheeled) Transfers: Sit to/from Stand Sit to Stand: Min guard         General transfer comment: Pt c/o back pain which causes a hitch in her rising from sitting. She stated that it is not the knee that is painful but the back. Needed trunk support from therapist  Ambulation/Gait Ambulation/Gait assistance: Min guard Ambulation Distance (Feet): 240 Feet Assistive device: Rolling walker (2 wheeled) Gait Pattern/deviations: Step-through pattern Gait velocity: decreased Gait velocity interpretation: Below normal speed for age/gender General Gait Details:  Pt ambulates with step-to pattern, but with VC she is able to ambulate relatively smoothly with step-through reciprocal gait pattern. No pain noted during ambulation.    Stairs            Wheelchair Mobility    Modified Rankin (Stroke Patients Only)       Balance Overall balance assessment: No apparent balance deficits (not formally assessed)                                  Cognition Arousal/Alertness: Awake/alert Behavior During Therapy: WFL for tasks assessed/performed Overall Cognitive Status: Within Functional Limits for tasks assessed                      Exercises Total Joint Exercises Ankle Circles/Pumps: AROM;Both;20 reps;Supine Quad Sets: Strengthening;Both;20 reps;Supine Hip ABduction/ADduction: AROM;Left;20 reps;Standing Straight Leg Raises: AROM;Left;20 reps;Standing Goniometric ROM: L knee AAROM: 2 - 85 degrees  Marching in Standing: AROM;Left;20 reps;Standing Standing Hip Extension: AROM;Left;20 reps;Standing Other Exercises Other Exercises: Pt performed therapeutic exercise x 10 reps on bilateral LE with supervision for proper technique. Exercises performed included: ankle pumps, SAQ, quad sets, glute sets, hip abd/add, heel slides and SLR (pt able to succesfully perform 10 AROM SLRs without help from therapist, therefore no need for KI). All exercises were AROM    General Comments        Pertinent Vitals/Pain Pain Assessment: No/denies pain Pain Score: 0-No pain    Home Living Family/patient expects to be discharged to:: Private residence Living Arrangements: Spouse/significant other Available Help at Discharge: Family;Available 24 hours/day (husband )  Type of Home: House Home Access: Stairs to enter Entrance Stairs-Rails: None Home Layout: One level Home Equipment: Environmental consultant - 2 wheels Additional Comments: Pt is equipped to return home safely, evident by successful trip home a few months ago    Prior Function Level of  Independence: Independent      Comments: Able to get around indep after her 1st TKR   PT Goals (current goals can now be found in the care plan section) Acute Rehab PT Goals Patient Stated Goal: to go home PT Goal Formulation: With patient Time For Goal Achievement: 09/17/14 Potential to Achieve Goals: Good Progress towards PT goals: Progressing toward goals    Frequency  BID    PT Plan Current plan remains appropriate    Co-evaluation             End of Session Equipment Utilized During Treatment: Gait belt Activity Tolerance: Patient tolerated treatment well Patient left: in bed;with call bell/phone within reach (pt did not wish bed alarm on )     Time: 1454-1520 PT Time Calculation (min) (ACUTE ONLY): 26 min  Charges:  $Gait Training: 8-22 mins $Therapeutic Exercise: 8-22 mins                    G Codes:      Charlaine Dalton 09/03/2014, 3:27 PM

## 2014-09-03 NOTE — Care Management Note (Addendum)
Case Management Note  Patient Details  Name: BRANDEN VINE MRN: [id number] Date of Birth: 1950/09/03  Subjective/Objective:                  Met with patient and her husband to discuss discharge planning. "This is not my first surgery". She states she would like to use [facility] like before. She plans to return home with her husband. She has a rolling walker and bedside commode at home. She uses [facility] [place]  [phone] for Rx. Action/Plan: List of home health agencies provided to patient. Referral called to Tim/Jerry with [facility]. Lovenox 5m #14 called in to [facility]. RNCM will continue to follow.   Expected Discharge Date:                  Expected Discharge Plan:     In-House Referral:     Discharge planning Services  CM Consult  Post Acute Care Choice:  Home Health Choice offered to:  Patient, Spouse  DME Arranged:  N/A DME Agency:     HH Arranged:    HH Agency:  GSan Dimas Status of Service:  In process, will continue to follow  Medicare Important Message Given:    Date Medicare IM Given:    Medicare IM give by:    Date Additional Medicare IM Given:    Additional Medicare Important Message give by:     If discussed at [place] Meet[place], dates discussed:    Additional Comments: Lovenox $4.  AMarshell Garfinkel RN 09/03/2014, 10:48 AM

## 2014-09-03 NOTE — Progress Notes (Signed)
Clinical Social Worker (CSW) received SNF consult. PT is recommending home health. RN Case Manager aware of above. Please reconsult if future social work needs arise. CSW signing off.   Kazimierz Springborn Morgan, LCSWA [phone] 

## 2014-09-03 NOTE — Progress Notes (Signed)
Pt having loose stools. Dr Marry Guan notified.  Order received to send stool for C-Diff and hold stool softener

## 2014-09-03 NOTE — Progress Notes (Signed)
Pt up at bedside to dangle. No complaint of pain or discomfort.

## 2014-09-03 NOTE — Anesthesia Postprocedure Evaluation (Signed)
  Anesthesia Post-op Note  Patient: Tonya Walton  Procedure(s) Performed: Procedure(s): TOTAL KNEE ARTHROPLASTY (Left)  Anesthesia type:Spinal  Patient location: PACU  Post pain: Pain level controlled  Post assessment: Post-op Vital signs reviewed, Patient's Cardiovascular Status Stable, Respiratory Function Stable, Patent Airway and No signs of Nausea or vomiting  Post vital signs: Reviewed and stable  Last Vitals:  Filed Vitals:   09/03/14 0800  BP: 121/67  Pulse: 64  Temp: 36.7 C  Resp:     Level of consciousness: awake, alert  and patient cooperative  Complications: No apparent anesthesia complications

## 2014-09-03 NOTE — Progress Notes (Signed)
Physical Therapy Treatment Patient Details Name: Tonya Walton MRN: [id number] DOB: November 03, 1950 Today's Date: 09/03/2014    History of Present Illness Pt admitted to the hospital s/p L TKR. Pt has recent hx of R TKR performed by same surgeon here at [facility]    PT Comments    Pt presents with hx of anxiety, depression, dysrhythmia, CHF, HTN, COPD, hypothyroidism, GERD, neuromuscular disorder, arthritis, cancer, A-fib, and restless leg syndrome. Examination reveals that pt is performing very well post-op day 1. She is ambulating 50 ft with CGA, performing bed mobility with min assist, and transfers with mod assist (limited by back pain). She still has decreased strength and ROM that will continue to benefit from skilled PT in order for her to return home safely. She has a good support system at home and is motivated to participate with PT.   Follow Up Recommendations  Home health PT     Equipment Recommendations  None recommended by PT    Recommendations for Other Services       Precautions / Restrictions Precautions Precautions: Knee Precaution Booklet Issued: No Restrictions Weight Bearing Restrictions: Yes LLE Weight Bearing: Weight bearing as tolerated    Mobility  Bed Mobility Overal bed mobility: Needs Assistance Bed Mobility: Supine to Sit     Supine to sit: Min assist     General bed mobility comments: Pt able to manage LE carefully to get it out of bed. Needs min assist for scooting to EOB.   Transfers Overall transfer level: Needs assistance Equipment used: Rolling walker (2 wheeled) Transfers: Sit to/from Stand Sit to Stand: Mod assist         General transfer comment: Pt c/o back pain which causes a hitch in her rising from sitting. She stated that it is not the knee that is painful but the back. Needed trunk support from therapist  Ambulation/Gait Ambulation/Gait assistance: Min guard Ambulation Distance (Feet): 50 Feet Assistive device: Rolling walker  (2 wheeled) Gait Pattern/deviations: Step-to pattern;Step-through pattern Gait velocity: decreased   General Gait Details: Pt ambulates with step-to pattern, but with VC she is able to ambulate relatively smoothly with step-through reciprocal gait pattern. No pain noted during ambulation.    Stairs            Wheelchair Mobility    Modified Rankin (Stroke Patients Only)       Balance Overall balance assessment: No apparent balance deficits (not formally assessed)                                  Cognition Arousal/Alertness: Awake/alert Behavior During Therapy: WFL for tasks assessed/performed Overall Cognitive Status: Within Functional Limits for tasks assessed                      Exercises Total Joint Exercises Goniometric ROM: L knee AAROM: 2 - 85 degrees  Other Exercises Other Exercises: Pt performed therapeutic exercise x 10 reps on bilateral LE with supervision for proper technique. Exercises performed included: ankle pumps, SAQ, quad sets, glute sets, hip abd/add, heel slides and SLR (pt able to succesfully perform 10 AROM SLRs without help from therapist, therefore no need for KI). All exercises were AROM    General Comments        Pertinent Vitals/Pain Pain Assessment: No/denies pain Pain Score: 0-No pain    Home Living Family/patient expects to be discharged to:: Private residence Living Arrangements: Spouse/significant other  Available Help at Discharge: Family;Available 24 hours/day (husband ) Type of Home: House Home Access: Stairs to enter Entrance Stairs-Rails: None Home Layout: One level Home Equipment: Environmental consultant - 2 wheels Additional Comments: Pt is equipped to return home safely, evident by successful trip home a few months ago    Prior Function Level of Independence: Independent      Comments: Able to get around indep after her 1st TKR   PT Goals (current goals can now be found in the care plan section) Acute Rehab PT  Goals Patient Stated Goal: to go home PT Goal Formulation: With patient Time For Goal Achievement: 09/17/14 Potential to Achieve Goals: Good    Frequency  BID    PT Plan      Co-evaluation             End of Session Equipment Utilized During Treatment: Gait belt Activity Tolerance: Patient tolerated treatment well Patient left: in chair;with call bell/phone within reach;with SCD's reapplied;with family/visitor present (Nurse notified of malfunctioning chair alarm)     Time: [phone] PT Time Calculation (min) (ACUTE ONLY): 33 min  Charges:                       G CodesJanyth Contes 27-Sep-2014, 1:15 PM  Janyth Contes, SPT. [phone]

## 2014-09-04 LAB — CBC
HEMATOCRIT: 33.6 % — AB (ref 35.0–47.0)
HEMOGLOBIN: 11 g/dL — AB (ref 12.0–16.0)
MCH: 28.2 pg (ref 26.0–34.0)
MCHC: 32.6 g/dL (ref 32.0–36.0)
MCV: 86.3 fL (ref 80.0–100.0)
PLATELETS: 212 10*3/uL (ref 150–440)
RBC: 3.9 MIL/uL (ref 3.80–5.20)
RDW: 15.2 % — AB (ref 11.5–14.5)
WBC: 8.2 10*3/uL (ref 3.6–11.0)

## 2014-09-04 LAB — URINALYSIS COMPLETE WITH MICROSCOPIC (ARMC ONLY)
BILIRUBIN URINE: NEGATIVE
Bacteria, UA: NONE SEEN
GLUCOSE, UA: NEGATIVE mg/dL
Hgb urine dipstick: NEGATIVE
Ketones, ur: NEGATIVE mg/dL
Leukocytes, UA: NEGATIVE
Nitrite: NEGATIVE
Protein, ur: NEGATIVE mg/dL
RBC / HPF: NONE SEEN RBC/hpf (ref 0–5)
Specific Gravity, Urine: 1.006 (ref 1.005–1.030)
Squamous Epithelial / LPF: NONE SEEN
pH: 5 (ref 5.0–8.0)

## 2014-09-04 MED ORDER — OXYCODONE HCL 5 MG PO TABS: 5.0000 mg | ORAL_TABLET | ORAL | Status: DC | PRN

## 2014-09-04 MED ORDER — TRAMADOL HCL 50 MG PO TABS: 50.0000 mg | ORAL_TABLET | Freq: Four times a day (QID) | ORAL | Status: DC | PRN

## 2014-09-04 MED ORDER — ENOXAPARIN SODIUM 40 MG/0.4ML ~~LOC~~ SOLN: 40.0000 mg | SUBCUTANEOUS | Status: DC

## 2014-09-04 NOTE — Care Management Important Message (Signed)
Important Message  Patient Details  Name: TEEGAN GUINTHER MRN: [id number] Date of Birth: [birth year]   Medicare Important Message Given:  Yes-second notification given    Marshell Garfinkel, RN 09/04/2014, 9:23 AM

## 2014-09-04 NOTE — Progress Notes (Signed)
Occupational Therapy Treatment Patient Details Name: Tonya Walton MRN: [id number] DOB: Apr 05, 1950 Today's Date: 09/04/2014    History of present illness 64 year old female who came to [facility] for a L TKR with recient R TKR   OT comments  Patient demonstrated ability to dress upper and lower body with contact guard assist and no verbal cues. No further Occupational Therapy needed at this time.  Follow Up Recommendations  No OT follow up    Equipment Recommendations       Recommendations for Other Services      Precautions / Restrictions Precautions Precautions: Knee Restrictions RLE Weight Bearing: Weight bearing as tolerated LLE Weight Bearing: Weight bearing as tolerated       Mobility Bed Mobility                  Transfers                      Balance                                   ADL                                         General ADL Comments: Patient dressed upper and lower body with contact guard assist and no assistive devices. She required no cues.      Vision                     Perception     Praxis      Cognition   Behavior During Therapy: WFL for tasks assessed/performed Overall Cognitive Status: Within Functional Limits for tasks assessed                       Extremity/Trunk Assessment               Exercises     Shoulder Instructions       General Comments      Pertinent Vitals/ Pain       Pain Assessment: 0-10 Pain Score: 1   Home Living                                          Prior Functioning/Environment              Frequency       Progress Toward Goals  OT Goals(current goals can now be found in the care plan section)  Progress towards OT goals: Progressing toward goals  Acute Rehab OT Goals Patient Stated Goal: to go home  Plan      Co-evaluation                 End of Session Equipment Utilized During  Treatment:  (none)   Activity Tolerance Patient tolerated treatment well   Patient Left in chair;with call bell/phone within reach;with chair alarm set   Nurse Communication          Time: [phone] OT Time Calculation (min): 11 min  Charges: OT General Charges $OT Visit: 1 Procedure OT Treatments $Self Care/Home Management : 8-22 mins  Myrene Galas, MS/OTR/L  09/04/2014, 10:05 AM

## 2014-09-04 NOTE — Discharge Summary (Signed)
Physician Discharge Summary  Subjective: 2 Days Post-Op Procedure(s) (LRB): TOTAL KNEE ARTHROPLASTY (Left) Patient reports pain as mild.   Patient seen in rounds with Dr. Marry Guan. Patient is well, and has had no acute complaints or problems Patient is ready to go home with home health physical therapy.  Physician Discharge Summary  Patient ID: Tonya Walton MRN: [id number] DOB/AGE: 04/26/1950 64 y.o.  Admit date: 09/02/2014 Discharge date: 09/04/2014  Admission Diagnoses:  Discharge Diagnoses:  Active Problems:   Total knee replacement status   Discharged Condition: good  Hospital Course: The patient is status post left total knee arthroplasty done by Dr. Marry Guan on 09/02/2014. The patient is doing very well since surgery. The patient ambulated 240 feet with physical therapy on postoperative day 1. She was having loose stools on postoperative day 1 but was negative for C. difficile. Discontinuing the stool softeners alleviated her diarrhea. The patient was doing well and ready to go home on postop day 3.  Treatments: surgery:  PRE-OPERATIVE DIAGNOSIS: Degenerative arthrosis of the left knee, primary  POST-OPERATIVE DIAGNOSIS: Same  PROCEDURE: Left total knee arthroplasty using computer-assisted navigation  SURGEON: Marciano Sequin. M.D.  ASSISTANT: Rachelle Hora, PA-C (present and scrubbed throughout the case, critical for assistance with exposure, retraction, instrumentation, and closure)  ANESTHESIA: spinal  ESTIMATED BLOOD LOSS: 75 mL  FLUIDS REPLACED: 1000 mL of crystalloid  TOURNIQUET TIME: 86 minutes  DRAINS: 2 medium drains to a reinfusion system  SOFT TISSUE RELEASES: Anterior cruciate ligament, posterior cruciate ligament, deep medial collateral ligament, patellofemoral ligament   IMPLANTS UTILIZED: DePuy PFC Sigma size 3 posterior stabilized femoral component (cemented), size 3 MBT tibial component (cemented), 35 mm 3 peg oval dome patella (cemented), and  a 10 mm stabilized rotating platform polyethylene insert.  Discharge Exam: Blood pressure 141/64, pulse 72, temperature 98.1 F (36.7 C), temperature source Oral, resp. rate 18, height 5\' 6"  (1.676 m), weight 112.22 kg (247 lb 6.4 oz), SpO2 93 %.   Disposition: 06-Home-Health Care Svc     Medication List    TAKE these medications        acetaminophen 500 MG tablet  Commonly known as:  TYLENOL  Take 1,000 mg by mouth at bedtime.     anastrozole 1 MG tablet  Commonly known as:  ARIMIDEX  Take 1 mg by mouth at bedtime.     clonazePAM 1 MG tablet  Commonly known as:  KLONOPIN  Take 1 tablet (1 mg total) by mouth at bedtime.     cyclobenzaprine 10 MG tablet  Commonly known as:  FLEXERIL  Take 10 mg by mouth 2 (two) times daily as needed for muscle spasms.     diphenhydrAMINE 25 MG tablet  Commonly known as:  BENADRYL  Take 25 mg by mouth at bedtime.     enoxaparin 40 MG/0.4ML injection  Commonly known as:  LOVENOX  Inject 0.4 mLs (40 mg total) into the skin daily.     FLUoxetine 40 MG capsule  Commonly known as:  PROZAC  Take 1 capsule (40 mg total) by mouth daily.     furosemide 40 MG tablet  Commonly known as:  LASIX  Take 40 mg by mouth daily.     gabapentin 300 MG capsule  Commonly known as:  NEURONTIN  Take 1,200 mg by mouth 2 (two) times daily.     levothyroxine 25 MCG tablet  Commonly known as:  SYNTHROID, LEVOTHROID  Take 25 mcg by mouth daily.     meloxicam  15 MG tablet  Commonly known as:  MOBIC  Take 15 mg by mouth daily.     metoprolol succinate 50 MG 24 hr tablet  Commonly known as:  TOPROL-XL  Take 50 mg by mouth every morning.     montelukast 10 MG tablet  Commonly known as:  SINGULAIR  Take 10 mg by mouth at bedtime.     oxybutynin 5 MG tablet  Commonly known as:  DITROPAN  Take 5 mg by mouth 3 (three) times daily as needed for bladder spasms.     oxyCODONE 5 MG immediate release tablet  Commonly known as:  Oxy IR/ROXICODONE  Take  1-2 tablets (5-10 mg total) by mouth every 4 (four) hours as needed for breakthrough pain.     pramipexole 0.25 MG tablet  Commonly known as:  MIRAPEX  Take 0.25-0.5 mg by mouth 3 (three) times daily. Pt takes two tablets in the morning, one tablet at noon, and two tablets at bedtime.     traMADol 50 MG tablet  Commonly known as:  ULTRAM  Take 1-2 tablets (50-100 mg total) by mouth every 6 (six) hours as needed for moderate pain.     zolpidem 5 MG tablet  Commonly known as:  AMBIEN  Take 1 tablet (5 mg total) by mouth at bedtime.           Follow-up Information    Follow up with [facility] PA On 09/17/2014.   Specialty:  Physician Assistant   Why:  Appointment is at 9:15   Contact information:   [place] [address] [phone]       Follow up with Dereck Leep, MD On 10/13/2014.   Specialty:  Orthopedic Surgery   Why:  Appointment is at 10:45   Contact information:   [place] [address] [phone]       Follow up On 09/17/2014.   Why:  Appointment at 9:15 AM      SignedPrescott Parma, Teran Knittle 09/04/2014, 6:22 AM   Objective: Vital signs in last 24 hours: Temp:  [98 F (36.7 C)-98.7 F (37.1 C)] 98.1 F (36.7 C) (08/05 0440) Pulse Rate:  [64-75] 72 (08/05 0440) Resp:  [18-20] 18 (08/05 0440) BP: (121-149)/(54-67) 141/64 mmHg (08/05 0440) SpO2:  [93 %-100 %] 93 % (08/05 0440)  Intake/Output from previous day:  Intake/Output Summary (Last 24 hours) at 09/04/14 0622 Last data filed at 09/04/14 0612  Gross per 24 hour  Intake    200 ml  Output    630 ml  Net   -430 ml    Intake/Output this shift: Total I/O In: -  Out: 180 [Drains:180]  Labs:  Recent Labs  09/03/14 0425 09/04/14 0505  HGB 11.3* 11.0*    Recent Labs  09/03/14 0425 09/04/14 0505  WBC 6.5 8.2  RBC 3.93 3.90  HCT 34.1* 33.6*  PLT 194 212    Recent Labs  09/03/14 0425  NA 143  K 4.1  CL 108  CO2 27  BUN 9  CREATININE 0.67   GLUCOSE 107*  CALCIUM 8.8*   No results for input(s): LABPT, INR in the last 72 hours.  EXAM: General - Patient is Alert and Oriented Extremity - Neurovascular intact Dorsiflexion/Plantar flexion intact No cellulitis present Compartment soft Incision - clean, dry, no drainage Motor Function -  the patient was able to do a straight leg raise as well as dorsiflex and plantarflex her ankle. She ambulated 240 feet.  Assessment/Plan: 2  Days Post-Op Procedure(s) (LRB): TOTAL KNEE ARTHROPLASTY (Left) Procedure(s) (LRB): TOTAL KNEE ARTHROPLASTY (Left) Past Medical History  Diagnosis Date  . Anxiety   . Depression   . Dysrhythmia     Afib  . CHF (congestive heart failure)   . Hypertension   . COPD (chronic obstructive pulmonary disease)   . Hypothyroidism   . GERD (gastroesophageal reflux disease)   . Headache   . 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 Arthritis   . Cancer     right breast  . Atrial fibrillation, controlled   . Shortness of breath dyspnea   . Restless leg syndrome    Active Problems:   Total knee replacement status  Estimated body mass index is 39.95 kg/(m^2) as calculated from the following:   Height as of this encounter: 5\' 6"  (1.676 m).   Weight as of this encounter: 112.22 kg (247 lb 6.4 oz). Plan for discharge tomorrow Diet - Regular diet Follow up - in 2 weeks Activity - WBAT Disposition - Home Condition Upon Discharge - Good DVT Prophylaxis - Lovenox and Ted support hose.  Reche Dixon, PA-C Orthopaedic Surgery 09/04/2014, 6:22 AM

## 2014-09-04 NOTE — Discharge Instructions (Signed)
INSTRUCTIONS AFTER Surgery ° °o Remove items at home which could result in a fall. This includes throw rugs or furniture in walking pathways °o ICE to the affected joint every three hours while awake for 30 minutes at a time, for at least the first 3-5 days, and then as needed for pain and swelling.  Continue to use ice for pain and swelling. You may notice swelling that will progress down to the foot and ankle.  This is normal after surgery.  Elevate your leg when you are not up walking on it.   °o Continue to use the breathing machine you got in the hospital (incentive spirometer) which will help keep your temperature down.  It is common for your temperature to cycle up and down following surgery, especially at night when you are not up moving around and exerting yourself.  The breathing machine keeps your lungs expanded and your temperature down. ° ° °DIET:  As you were doing prior to hospitalization, we recommend a well-balanced diet. ° °DRESSING / WOUND CARE / SHOWERING ° °Keep the surgical dressing until follow up.  The dressing is water proof, so you can shower without any extra covering.  IF THE DRESSING FALLS OFF or the wound gets wet inside, change the dressing with sterile gauze.  Please use good hand washing techniques before changing the dressing.  Do not use any lotions or creams on the incision until instructed by your surgeon.   ° °ACTIVITY ° °o Increase activity slowly as tolerated, but follow the weight bearing instructions below.   °o No driving for 6 weeks or until further direction given by your physician.  You cannot drive while taking narcotics.  °o No lifting or carrying greater than 10 lbs. until further directed by your surgeon. °o Avoid periods of inactivity such as sitting longer than an hour when not asleep. This helps prevent blood clots.  °o You may return to work once you are authorized by your doctor.  ° ° ° °WEIGHT BEARING  ° °Weight bearing as tolerated with assist device (walker,  cane, etc) as directed, use it as long as suggested by your surgeon or therapist, typically at least 4-6 weeks. ° ° °EXERCISES ° °Results after joint surgery are often greatly improved when you follow the exercise, range of motion and muscle strengthening exercises prescribed by your doctor. Safety measures are also important to protect the joint from further injury. Any time any of these exercises cause you to have increased pain or swelling, decrease what you are doing until you are comfortable again and then slowly increase them. If you have problems or questions, call your caregiver or physical therapist for advice.  ° °Rehabilitation is important following a joint surgery. After just a few days of immobilization, the muscles of the leg can become weakened and shrink (atrophy).  These exercises are designed to build up the tone and strength of the thigh and leg muscles and to improve motion. Often times heat used for twenty to thirty minutes before working out will loosen up your tissues and help with improving the range of motion but do not use heat for the first two weeks following surgery (sometimes heat can increase post-operative swelling).  ° °These exercises can be done on a training (exercise) mat, on the floor, on a table or on a bed. Use whatever works the best and is most comfortable for you.    Use music or television while you are exercising so that the exercises are   a pleasant break in your day. This will make your life better with the exercises acting as a break in your routine that you can look forward to.   Perform all exercises about fifteen times, three times per day or as directed.  You should exercise both the operative leg and the other leg as well. ° °Exercises include: °  °• Quad Sets - Tighten up the muscle on the front of the thigh (Quad) and hold for 5-10 seconds.   °• Straight Leg Raises - With your knee straight (if you were given a brace, keep it on), lift the leg to 60 degrees,  hold for 3 seconds, and slowly lower the leg.  Perform this exercise against resistance later as your leg gets stronger.  °• Leg Slides: Lying on your back, slowly slide your foot toward your buttocks, bending your knee up off the floor (only go as far as is comfortable). Then slowly slide your foot back down until your leg is flat on the floor again.  °• Angel Wings: Lying on your back spread your legs to the side as far apart as you can without causing discomfort.  °• Hamstring Strength:  Lying on your back, push your heel against the floor with your leg straight by tightening up the muscles of your buttocks.  Repeat, but this time bend your knee to a comfortable angle, and push your heel against the floor.  You may put a pillow under the heel to make it more comfortable if necessary.  ° °A rehabilitation program following joint surgery can speed recovery and prevent re-injury in the future due to weakened muscles. Contact your doctor or a physical therapist for more information on knee rehabilitation.  ° ° °CONSTIPATION ° °Constipation is defined medically as fewer than three stools per week and severe constipation as less than one stool per week.  Even if you have a regular bowel pattern at home, your normal regimen is likely to be disrupted due to multiple reasons following surgery.  Combination of anesthesia, postoperative narcotics, change in appetite and fluid intake all can affect your bowels.  ° °YOU MUST use at least one of the following options; they are listed in order of increasing strength to get the job done.  They are all available over the counter, and you may need to use some, POSSIBLY even all of these options:   ° °Drink plenty of fluids (prune juice may be helpful) and high fiber foods °Colace 100 mg by mouth twice a day  °Senokot for constipation as directed and as needed Dulcolax (bisacodyl), take with full glass of water  °Miralax (polyethylene glycol) once or twice a day as needed. ° °If  you have tried all these things and are unable to have a bowel movement in the first 3-4 days after surgery call either your surgeon or your primary doctor.   ° °If you experience loose stools or diarrhea, hold the medications until you stool forms back up.  If your symptoms do not get better within 1 week or if they get worse, check with your doctor.  If you experience "the worst abdominal pain ever" or develop nausea or vomiting, please contact the office immediately for further recommendations for treatment. ° ° °ITCHING:  If you experience itching with your medications, try taking only a single pain pill, or even half a pain pill at a time.  You can also use Benadryl over the counter for itching or also to help with sleep.  ° °  TED HOSE STOCKINGS:  Use stockings on both legs until for at least 2 weeks or as directed by physician office. They may be removed at night for sleeping. ° °MEDICATIONS:  See your medication summary on the “After Visit Summary” that nursing will review with you.  You may have some home medications which will be placed on hold until you complete the course of blood thinner medication.  It is important for you to complete the blood thinner medication as prescribed. ° °PRECAUTIONS:  If you experience chest pain or shortness of breath - call 911 immediately for transfer to the hospital emergency department.  ° °If you develop a fever greater that 101 F, purulent drainage from wound, increased redness or drainage from wound, foul odor from the wound/dressing, or calf pain - CONTACT YOUR SURGEON.   °                                                °FOLLOW-UP APPOINTMENTS:  If you do not already have a post-op appointment, please call the office for an appointment to be seen by your surgeon.  Guidelines for how soon to be seen are listed in your “After Visit Summary”, but are typically between 1-4 weeks after surgery. ° °OTHER INSTRUCTIONS:  ° ° ° °MAKE SURE YOU:  °• Understand these instructions.   °• Get help right away if you are not doing well or get worse.  ° ° °Thank you for letting us be a part of your medical care team.  It is a privilege we respect greatly.  We hope these instructions will help you stay on track for a fast and full recovery!  °

## 2014-09-04 NOTE — Progress Notes (Signed)
Physical Therapy Treatment Patient Details Name: Tonya Walton MRN: [id number] DOB: [birth year] Today's Date: 09/04/2014    History of Present Illness 64 year old female who came to [facility] for a L TKR with recient R TKR    PT Comments    Pt is able to ambulate very well with walker and negotiates up/down steps.  She shows great confidence with all mobility and has negligible pain t/o session.  She is pleasant t/o session and generally is well above typical POD2 goals.   Follow Up Recommendations  Home health PT     Equipment Recommendations  None recommended by PT    Recommendations for Other Services       Precautions / Restrictions Precautions Precautions: Knee Restrictions RLE Weight Bearing: Weight bearing as tolerated LLE Weight Bearing: Weight bearing as tolerated    Mobility  Bed Mobility Overal bed mobility: Modified Independent             General bed mobility comments: Pt able to easily get to the edge of the bed w/o issues  Transfers Overall transfer level: Needs assistance               General transfer comment: Pt able to rise w/o issue showing good confidence and ability to rise.  Ambulation/Gait Ambulation/Gait assistance: Modified independent (Device/Increase time) Ambulation Distance (Feet): 250 Feet Assistive device: Rolling walker (2 wheeled)       General Gait Details: Pt shows very good confidence, speed and consistency with ambulation.  She has no LOBs, almost no fatigue and generally does very well.   Stairs Stairs: Yes Stairs assistance: Supervision Stair Management: One rail Right Number of Stairs: 4 General stair comments: Pt is able to negotiate up/down steps w/o issue and shows good safety awareness and minimal reliance on the hand rail  Wheelchair Mobility    Modified Rankin (Stroke Patients Only)       Balance                                    Cognition Arousal/Alertness: Awake/alert Behavior  During Therapy: WFL for tasks assessed/performed Overall Cognitive Status: Within Functional Limits for tasks assessed                      Exercises Total Joint Exercises Ankle Circles/Pumps: AROM;Walton;20 reps;Supine Quad Sets: Strengthening;Walton;Supine;10 reps Short Arc Quad: Strengthening;10 reps Heel Slides: AROM;PROM;10 reps (with gentle flexion overpressure) Hip ABduction/ADduction: AROM;Left;15 reps Straight Leg Raises: AROM;Left;10 reps Goniometric ROM: 0-95    General Comments        Pertinent Vitals/Pain Pain Assessment: 0-10 Pain Score: 0-No pain (increases to 1 during activity)    Home Living                      Prior Function            PT Goals (current goals can now be found in the care plan section) Acute Rehab PT Goals Patient Stated Goal: to go home Progress towards PT goals: Progressing toward goals    Frequency  BID    PT Plan Current plan remains appropriate    Co-evaluation             End of Session Equipment Utilized During Treatment: Gait belt         Time: 6144-3154 PT Time Calculation (min) (ACUTE ONLY): 28 min  Charges:  $Gait  Training: 8-22 mins $Therapeutic Exercise: 8-22 mins                    G Codes:     Tonya Walton, PT, DPT [phone]  Tonya Walton 09/04/2014, 11:12 AM

## 2014-09-04 NOTE — Progress Notes (Signed)
Dr.Menz paged, waiting for callback- to inform of pt's temp-101. Call back received updated with pt's temp of 101 and dry cough, order received to get a urinalysis for now. Declined need for chest x-ray.

## 2014-09-04 NOTE — Progress Notes (Signed)
  Subjective: 2 Days Post-Op Procedure(s) (LRB): TOTAL KNEE ARTHROPLASTY (Left) Patient reports pain as mild.   Patient seen in rounds with Dr. Marry Guan. Patient is well, and has had no acute complaints or problems Plan is to go Home after hospital stay. Negative for chest pain and shortness of breath Fever: no Gastrointestinal: Negative for nausea and vomiting. The patient was having diarrhea in the p.m., but she is doing better. She was negative for C. difficile.  Objective: Vital signs in last 24 hours: Temp:  [98 F (36.7 C)-98.7 F (37.1 C)] 98.1 F (36.7 C) (08/05 0440) Pulse Rate:  [64-75] 72 (08/05 0440) Resp:  [18-20] 18 (08/05 0440) BP: (121-149)/(53-67) 141/64 mmHg (08/05 0440) SpO2:  [93 %-100 %] 93 % (08/05 0440)  Intake/Output from previous day:  Intake/Output Summary (Last 24 hours) at 09/04/14 0615 Last data filed at 09/04/14 0612  Gross per 24 hour  Intake    200 ml  Output   1430 ml  Net  -1230 ml    Intake/Output this shift: Total I/O In: -  Out: 180 [Drains:180]  Labs:  Recent Labs  09/03/14 0425 09/04/14 0505  HGB 11.3* 11.0*    Recent Labs  09/03/14 0425 09/04/14 0505  WBC 6.5 8.2  RBC 3.93 3.90  HCT 34.1* 33.6*  PLT 194 212    Recent Labs  09/03/14 0425  NA 143  K 4.1  CL 108  CO2 27  BUN 9  CREATININE 0.67  GLUCOSE 107*  CALCIUM 8.8*   No results for input(s): LABPT, INR in the last 72 hours.   EXAM General - Patient is Alert and Oriented Extremity - Neurovascular intact Dorsiflexion/Plantar flexion intact Compartment soft Dressing/Incision - clean, dry, no drainage, the Hemovac was removed without complication. Dressing was changed. Motor Function - intact, moving foot and toes well on exam. The patient is able to do a straight leg raise independently. She ambulated 240 feet with physical therapy yesterday.  Past Medical History  Diagnosis Date  . Anxiety   . Depression   . Dysrhythmia     Afib  . CHF  (congestive heart failure)   . Hypertension   . COPD (chronic obstructive pulmonary disease)   . Hypothyroidism   . GERD (gastroesophageal reflux disease)   . Headache   . 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 Arthritis   . Cancer     right breast  . Atrial fibrillation, controlled   . Shortness of breath dyspnea   . Restless leg syndrome     Assessment/Plan: 2 Days Post-Op Procedure(s) (LRB): TOTAL KNEE ARTHROPLASTY (Left) Active Problems:   Total knee replacement status  Estimated body mass index is 39.95 kg/(m^2) as calculated from the following:   Height as of this encounter: 5\' 6"  (1.676 m).   Weight as of this encounter: 112.22 kg (247 lb 6.4 oz). D/C IV fluids Plan for discharge tomorrow  DVT Prophylaxis - Lovenox, Foot Pumps and TED hose Weight-Bearing as tolerated to left leg  Reche Dixon, PA-C Orthopaedic Surgery 09/04/2014, 6:15 AM

## 2014-09-04 NOTE — Progress Notes (Signed)
Physical Therapy Treatment Patient Details Name: Tonya Walton MRN: [id number] DOB: 02-07-1950 Today's Date: 09/04/2014    History of Present Illness 64 year old female who came to [facility] for a L TKR with recient R TKR    PT Comments    Pt continues to do very well with PT and is well above typical POD2 levels for ambulation, mobility and ROM.  Pt with no safety issues during longer walk and has no fatigue or other issues.   Follow Up Recommendations  Home health PT     Equipment Recommendations  None recommended by PT    Recommendations for Other Services       Precautions / Restrictions Restrictions LLE Weight Bearing: Weight bearing as tolerated    Mobility  Bed Mobility               General bed mobility comments: Pt in recliner, not tested  Transfers Overall transfer level: Independent Equipment used:  (able to rise and maintain balance w/o AD )                Ambulation/Gait Ambulation/Gait assistance: Modified independent (Device/Increase time) Ambulation Distance (Feet): 400 Feet Assistive device: Rolling walker (2 wheeled)       General Gait Details: Pt shows excellent confidence, speed and consistency with ambulation.  She has no LOBs, almost no fatigue.[place] Raid   Stairs assistance: Supervision        Wheelchair Mobility    Modified Rankin (Stroke Patients Only)       Balance                                    Cognition Arousal/Alertness: Awake/alert Behavior During Therapy: WFL for tasks assessed/performed Overall Cognitive Status: Within Functional Limits for tasks assessed                      Exercises Total Joint Exercises Ankle Circles/Pumps: AROM;Both;20 reps;Supine Quad Sets: Strengthening;Both;Supine;10 reps Short Arc Quad: Strengthening;10 reps Heel Slides: AROM;PROM;10 reps (with gentle flexion overpressure) Hip ABduction/ADduction: AROM;Left;15 reps Straight Leg Raises: AROM;Left;10  reps    General Comments        Pertinent Vitals/Pain Pain Assessment: 0-10 Pain Score: 1     Home Living                      Prior Function            PT Goals (current goals can now be found in the care plan section) Progress towards PT goals: Progressing toward goals    Frequency  BID    PT Plan Current plan remains appropriate    Co-evaluation             End of Session Equipment Utilized During Treatment: Gait belt         Time: 1062-6948 PT Time Calculation (min) (ACUTE ONLY): 28 min  Charges:  $Gait Training: 8-22 mins $Therapeutic Exercise: 8-22 mins                    G Codes:     Wayne Both, PT, DPT [phone]  Kreg Shropshire 09/04/2014, 5:52 PM

## 2014-09-05 ENCOUNTER — Inpatient Hospital Stay: Payer: PPO

## 2014-09-05 LAB — CBC
HEMATOCRIT: 31.6 % — AB (ref 35.0–47.0)
Hemoglobin: 10.6 g/dL — ABNORMAL LOW (ref 12.0–16.0)
MCH: 28.9 pg (ref 26.0–34.0)
MCHC: 33.4 g/dL (ref 32.0–36.0)
MCV: 86.6 fL (ref 80.0–100.0)
Platelets: 204 10*3/uL (ref 150–440)
RBC: 3.65 MIL/uL — ABNORMAL LOW (ref 3.80–5.20)
RDW: 14.8 % — AB (ref 11.5–14.5)
WBC: 8 10*3/uL (ref 3.6–11.0)

## 2014-09-05 MED ORDER — IBUPROFEN 400 MG PO TABS
800.0000 mg | ORAL_TABLET | Freq: Three times a day (TID) | ORAL | Status: DC | PRN
  Administered 2014-09-05: 800 mg via ORAL
  Filled 2014-09-05: qty 2

## 2014-09-05 NOTE — Progress Notes (Signed)
Discharge Instructions completed with patient and spouse.  Patient plans to discharge home with HHPT.  Notified Lyn Case manager so she could coordinate HHPT.  Patient previously had surgery and has equipment.

## 2014-09-05 NOTE — Progress Notes (Signed)
Vitals removed from 0900 and entered in real time.

## 2014-09-05 NOTE — Progress Notes (Signed)
No evidence of pneumonia per chest xray.  Ok to discharge per Rachelle Hora, Pa.  Educated and instructed patient to continue to use incentive spirometer 10x hour and to monitor temperature.  If continues to stay elevated, needs to contact [facility].

## 2014-09-05 NOTE — Progress Notes (Signed)
Per Rachelle Hora as precaution do chest xray to rule out pneumonia due to elevated temperature.  Ordered Ibuprofen.  If chest xray clear approved discharge.

## 2014-09-05 NOTE — Progress Notes (Signed)
Physical Therapy Treatment Patient Details Name: Tonya Walton MRN: [id number] DOB: 20-Jul-1950 Today's Date: 09/05/2014    History of Present Illness 64 year old female who came to [facility] for a L TKR with recient R TKR    PT Comments    Pt continues to do very well with PT and needs no assist with bed mobility and transfers.  Minimal cuing needed during ambulation, no increased pain of fatigue.  ROM >100 degrees of flexion and generally no safety issues throughout session.    Follow Up Recommendations  Home health PT     Equipment Recommendations  None recommended by PT    Recommendations for Other Services       Precautions / Restrictions Restrictions LLE Weight Bearing: Weight bearing as tolerated    Mobility  Bed Mobility Overal bed mobility: Independent Bed Mobility: Supine to Sit           General bed mobility comments: Pt in recliner, not tested  Transfers Overall transfer level: Independent Equipment used:  (able to rise and maintain balance w/o AD )             General transfer comment: Pt able to rise w/o issue showing good confidence and ability to rise without AD.  Ambulation/Gait Ambulation/Gait assistance: Modified independent (Device/Increase time) Ambulation Distance (Feet): 400 Feet Assistive device: Rolling walker (2 wheeled)       General Gait Details: Pt shows excellent confidence, speed and consistency with ambulation.  She has no LOBs, almost no fatigue and no hesitation with L LE WBing   Stairs   Stairs assistance: Supervision        Wheelchair Mobility    Modified Rankin (Stroke Patients Only)       Balance                                    Cognition Arousal/Alertness: Awake/alert Behavior During Therapy: WFL for tasks assessed/performed Overall Cognitive Status: Within Functional Limits for tasks assessed                      Exercises Total Joint Exercises Ankle Circles/Pumps:  AROM;Both;20 reps;Supine Quad Sets: Strengthening;Both;Supine;10 reps Short Arc Quad: Strengthening;10 reps Heel Slides: AROM;PROM;10 reps (with gentle flexion overpressure) Hip ABduction/ADduction: AROM;Left;15 reps Straight Leg Raises: AROM;Left;10 reps Goniometric ROM: 0-100    General Comments        Pertinent Vitals/Pain Pain Assessment: 0-10 Pain Score: 1     Home Living                      Prior Function            PT Goals (current goals can now be found in the care plan section) Progress towards PT goals: Progressing toward goals    Frequency  BID    PT Plan Current plan remains appropriate    Co-evaluation             End of Session Equipment Utilized During Treatment: Gait belt         Time: 0865-7846 PT Time Calculation (min) (ACUTE ONLY): 26 min  Charges:  $Gait Training: 8-22 mins $Therapeutic Exercise: 8-22 mins                    G Codes:     Wayne Both, PT, DPT [phone]  Kreg Shropshire 09/05/2014, 8:58 AM

## 2014-09-05 NOTE — Care Management Note (Signed)
Case Management Note  Patient Details  Name: Tonya Walton MRN: [id number] Date of Birth: 1950-09-01  Subjective/Objective:           Referral and D/C information faxed and called to [facility] requesting home health PT.   Tonya Walton already has a rolling walker and a bedside toilet at home from a previous surgery.        Action/Plan:   Expected Discharge Date:                  Expected Discharge Plan:     In-House Referral:     Discharge planning Services  CM Consult  Post Acute Care Choice:  Home Health Choice offered to:  Patient, Spouse  DME Arranged:  N/A DME Agency:     HH Arranged:    HH Agency:  [place]  Status of Service:  In process, will continue to follow  Medicare Important Message Given:  Yes-second notification given Date Medicare IM Given:    Medicare IM give by:    Date Additional Medicare IM Given:    Additional Medicare Important Message give by:     If discussed at [place] of Stay Meetings, dates discussed:    Additional Comments:  Gracen Southwell A, RN 09/05/2014, 8:33 AM

## 2014-09-05 NOTE — Progress Notes (Signed)
Completed discharge vitals and patient was found to have a low grade temp.  Called Rachelle Hora to see if we should proceed with discharge.

## 2014-09-05 NOTE — Progress Notes (Signed)
   Subjective: 3 Days Post-Op Procedure(s) (LRB): TOTAL KNEE ARTHROPLASTY (Left) Patient reports pain as mild.   Patient is well, and has had no acute complaints or problems We will continue with therapy today.  Plan is to go Home after hospital stay. No CP/SOB/Abd pain/Urinary sx/cough. Fever yesterday, none today. UA Normal.   Objective: Vital signs in last 24 hours: Temp:  [98.7 F (37.1 C)-101 F (38.3 C)] 98.9 F (37.2 C) (08/06 0745) Pulse Rate:  [84-85] 84 (08/06 0745) Resp:  [18] 18 (08/06 0745) BP: (134-145)/(60-82) 134/82 mmHg (08/06 0745) SpO2:  [93 %-96 %] 93 % (08/06 0745)  Intake/Output from previous day: 08/05 0701 - 08/06 0700 In: 120 [P.O.:120] Out: 600 [Urine:600] Intake/Output this shift:     Recent Labs  09/03/14 0425 09/04/14 0505 09/05/14 0421  HGB 11.3* 11.0* 10.6*    Recent Labs  09/04/14 0505 09/05/14 0421  WBC 8.2 8.0  RBC 3.90 3.65*  HCT 33.6* 31.6*  PLT 212 204    Recent Labs  09/03/14 0425  NA 143  K 4.1  CL 108  CO2 27  BUN 9  CREATININE 0.67  GLUCOSE 107*  CALCIUM 8.8*   No results for input(s): LABPT, INR in the last 72 hours.  EXAM General - Patient is Alert, Appropriate and Oriented Extremity - ABD soft Neurovascular intact Sensation intact distally Intact pulses distally Dorsiflexion/Plantar flexion intact Dressing - dressing C/D/I and no drainage Motor Function - intact, moving foot and toes well on exam.   Past Medical History  Diagnosis Date  . Anxiety   . Depression   . Dysrhythmia     Afib  . CHF (congestive heart failure)   . Hypertension   . COPD (chronic obstructive pulmonary disease)   . Hypothyroidism   . GERD (gastroesophageal reflux disease)   . Headache   . 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 Arthritis   . Cancer     right breast  . Atrial fibrillation, controlled   . Shortness of breath dyspnea   . Restless leg syndrome     Assessment/Plan:   3 Days Post-Op  Procedure(s) (LRB): TOTAL KNEE ARTHROPLASTY (Left) Active Problems:   Total knee replacement status   Acute post op blood loss anemia      Estimated body mass index is 39.95 kg/(m^2) as calculated from the following:   Height as of this encounter: 5\' 6"  (1.676 m).   Weight as of this encounter: 112.22 kg (247 lb 6.4 oz). Advance diet Up with therapy  Continue with therapy Discharge to home with home health PT today  DVT Prophylaxis - Lovenox, Foot Pumps and TED hose Weight-Bearing as tolerated to left leg D/C O2 and Pulse OX and try on Room Air  T. Rachelle Hora, PA-C [place] 09/05/2014, 8:39 AM

## 2014-10-07 ENCOUNTER — Encounter: Payer: Self-pay | Admitting: *Deleted

## 2014-10-12 ENCOUNTER — Inpatient Hospital Stay: Payer: PPO | Attending: Internal Medicine | Admitting: Internal Medicine

## 2014-10-12 ENCOUNTER — Other Ambulatory Visit: Payer: Self-pay

## 2014-10-12 VITALS — BP 119/77 | HR 73 | Temp 96.6°F | Resp 18 | Ht 66.0 in | Wt 231.5 lb

## 2014-10-12 DIAGNOSIS — C50911 Malignant neoplasm of unspecified site of right female breast: Secondary | ICD-10-CM

## 2014-10-12 DIAGNOSIS — I509 Heart failure, unspecified: Secondary | ICD-10-CM

## 2014-10-12 DIAGNOSIS — F329 Major depressive disorder, single episode, unspecified: Secondary | ICD-10-CM

## 2014-10-12 DIAGNOSIS — G2581 Restless legs syndrome: Secondary | ICD-10-CM | POA: Diagnosis not present

## 2014-10-12 DIAGNOSIS — Z853 Personal history of malignant neoplasm of breast: Secondary | ICD-10-CM

## 2014-10-12 DIAGNOSIS — R0602 Shortness of breath: Secondary | ICD-10-CM | POA: Diagnosis not present

## 2014-10-12 DIAGNOSIS — I739 Peripheral vascular disease, unspecified: Secondary | ICD-10-CM | POA: Diagnosis not present

## 2014-10-12 DIAGNOSIS — Z87891 Personal history of nicotine dependence: Secondary | ICD-10-CM

## 2014-10-12 DIAGNOSIS — Z808 Family history of malignant neoplasm of other organs or systems: Secondary | ICD-10-CM | POA: Diagnosis not present

## 2014-10-12 DIAGNOSIS — Z806 Family history of leukemia: Secondary | ICD-10-CM

## 2014-10-12 DIAGNOSIS — I10 Essential (primary) hypertension: Secondary | ICD-10-CM | POA: Diagnosis not present

## 2014-10-12 DIAGNOSIS — Z923 Personal history of irradiation: Secondary | ICD-10-CM | POA: Diagnosis not present

## 2014-10-12 DIAGNOSIS — I4891 Unspecified atrial fibrillation: Secondary | ICD-10-CM

## 2014-10-12 DIAGNOSIS — K219 Gastro-esophageal reflux disease without esophagitis: Secondary | ICD-10-CM

## 2014-10-12 DIAGNOSIS — J449 Chronic obstructive pulmonary disease, unspecified: Secondary | ICD-10-CM

## 2014-10-12 DIAGNOSIS — E039 Hypothyroidism, unspecified: Secondary | ICD-10-CM | POA: Diagnosis not present

## 2014-10-12 DIAGNOSIS — M129 Arthropathy, unspecified: Secondary | ICD-10-CM | POA: Diagnosis not present

## 2014-10-12 DIAGNOSIS — F419 Anxiety disorder, unspecified: Secondary | ICD-10-CM

## 2014-10-12 DIAGNOSIS — Z79899 Other long term (current) drug therapy: Secondary | ICD-10-CM

## 2014-10-12 DIAGNOSIS — Z170 Estrogen receptor positive status [ER+]: Secondary | ICD-10-CM | POA: Diagnosis not present

## 2014-10-12 DIAGNOSIS — Z79811 Long term (current) use of aromatase inhibitors: Secondary | ICD-10-CM | POA: Diagnosis not present

## 2014-10-12 NOTE — Progress Notes (Signed)
Patient is here for follow-up of breast cancer. Patient states that overall she has been feeling ok. She is scheduled for cataract surgery on Wednesday. She also had knee replacements on May 11th and August 3rd by Dr. Marry Guan.

## 2014-10-13 NOTE — Discharge Instructions (Signed)
Cataract Surgery Care After Refer to this sheet in the next few weeks. These instructions provide you with information on caring for yourself after your procedure. Your caregiver may also give you more specific instructions. Your treatment has been planned according to current medical practices, but problems sometimes occur. Call your caregiver if you have any problems or questions after your procedure.  HOME CARE INSTRUCTIONS   Avoid strenuous activities as directed by your caregiver.  Ask your caregiver when you can resume driving.  Use eyedrops or other medicines to help healing and control pressure inside your eye as directed by your caregiver.  Only take over-the-counter or prescription medicines for pain, discomfort, or fever as directed by your caregiver.  Do not to touch or rub your eyes.  You may be instructed to use a protective shield during the first few days and nights after surgery. If not, wear sunglasses to protect your eyes. This is to protect the eye from pressure or from being accidentally bumped.  Keep the area around your eye clean and dry. Avoid swimming or allowing water to hit you directly in the face while showering. Keep soap and shampoo out of your eyes.  Do not bend or lift heavy objects. Bending increases pressure in the eye. You can walk, climb stairs, and do light household chores.  Do not put a contact lens into the eye that had surgery until your caregiver says it is okay to do so.  Ask your doctor when you can return to work. This will depend on the kind of work that you do. If you work in a dusty environment, you may be advised to wear protective eyewear for a period of time.  Ask your caregiver when it will be safe to engage in sexual activity.  Continue with your regular eye exams as directed by your caregiver. What to expect:  It is normal to feel itching and mild discomfort for a few days after cataract surgery. Some fluid discharge is also common,  and your eye may be sensitive to light and touch.  After 1 to 2 days, even moderate discomfort should disappear. In most cases, healing will take about 6 weeks.  If you received an intraocular lens (IOL), you may notice that colors are very bright or have a blue tinge. Also, if you have been in bright sunlight, everything may appear reddish for a few hours. If you see these color tinges, it is because your lens is clear and no longer cloudy. Within a few months after receiving an IOL, these extra colors should go away. When you have healed, you will probably need new glasses. SEEK MEDICAL CARE IF:   You have increased bruising around your eye.  You have discomfort not helped by medicine. SEEK IMMEDIATE MEDICAL CARE IF:   You have a fever.  You have a worsening or sudden vision loss.  You have redness, swelling, or increasing pain in the eye.  You have a thick discharge from the eye that had surgery. MAKE SURE YOU:  Understand these instructions.  Will watch your condition.  Will get help right away if you are not doing well or get worse. Document Released: 08/05/2004 Document Revised: 04/10/2011 Document Reviewed: 09/09/2010 [facility] Patient Information 2015 [place], [place]. This information is not intended to replace advice given to you by your health care provider. Make sure you discuss any questions you have with your health care provider.  General Anesthesia, Care After Refer to this sheet in the next few  weeks. These instructions provide you with information on caring for yourself after your procedure. Your health care provider may also give you more specific instructions. Your treatment has been planned according to current medical practices, but problems sometimes occur. Call your health care provider if you have any problems or questions after your procedure. WHAT TO EXPECT AFTER THE PROCEDURE After the procedure, it is typical to experience:  Sleepiness.  Nausea and  vomiting. HOME CARE INSTRUCTIONS  For the first 24 hours after general anesthesia:  Have a responsible person with you.  Do not drive a car. If you are alone, do not take public transportation.  Do not drink alcohol.  Do not take medicine that has not been prescribed by your health care provider.  Do not sign important papers or make important decisions.  You may resume a normal diet and activities as directed by your health care provider.  Change bandages (dressings) as directed.  If you have questions or problems that seem related to general anesthesia, call the hospital and ask for the anesthetist or anesthesiologist on call. SEEK MEDICAL CARE IF:  You have nausea and vomiting that continue the day after anesthesia.  You develop a rash. SEEK IMMEDIATE MEDICAL CARE IF:   You have difficulty breathing.  You have chest pain.  You have any allergic problems. Document Released: 04/24/2000 Document Revised: 01/21/2013 Document Reviewed: 08/01/2012 [facility] Patient Information 2015 [place], [place]. This information is not intended to replace advice given to you by your health care provider. Make sure you discuss any questions you have with your health care provider.   General Anesthesia, Care After Refer to this sheet in the next few weeks. These instructions provide you with information on caring for yourself after your procedure. Your health care provider may also give you more specific instructions. Your treatment has been planned according to current medical practices, but problems sometimes occur. Call your health care provider if you have any problems or questions after your procedure. WHAT TO EXPECT AFTER THE PROCEDURE After the procedure, it is typical to experience:  Sleepiness.  Nausea and vomiting. HOME CARE INSTRUCTIONS  For the first 24 hours after general anesthesia:  Have a responsible person with you.  Do not drive a car. If you are alone, do not take  public transportation.  Do not drink alcohol.  Do not take medicine that has not been prescribed by your health care provider.  Do not sign important papers or make important decisions.  You may resume a normal diet and activities as directed by your health care provider.  Change bandages (dressings) as directed.  If you have questions or problems that seem related to general anesthesia, call the hospital and ask for the anesthetist or anesthesiologist on call. SEEK MEDICAL CARE IF:  You have nausea and vomiting that continue the day after anesthesia.  You develop a rash. SEEK IMMEDIATE MEDICAL CARE IF:   You have difficulty breathing.  You have chest pain.  You have any allergic problems. Document Released: 04/24/2000 Document Revised: 01/21/2013 Document Reviewed: 08/01/2012 [facility] Patient Information 2015 [place], [place]. This information is not intended to replace advice given to you by your health care provider. Make sure you discuss any questions you have with your health care provider.

## 2014-10-14 ENCOUNTER — Ambulatory Visit: Payer: PPO | Admitting: Anesthesiology

## 2014-10-14 ENCOUNTER — Encounter: Payer: Self-pay | Admitting: *Deleted

## 2014-10-14 ENCOUNTER — Ambulatory Visit
Admission: RE | Admit: 2014-10-14 | Discharge: 2014-10-14 | Disposition: A | Discharge status: Home / Self Care | Payer: PPO | Source: Ambulatory Visit | Attending: Ophthalmology | Admitting: Ophthalmology

## 2014-10-14 ENCOUNTER — Encounter
Admission: RE | Disposition: A | Discharge status: Home / Self Care | Payer: Self-pay | Source: Ambulatory Visit | Attending: Ophthalmology

## 2014-10-14 DIAGNOSIS — Z87891 Personal history of nicotine dependence: Secondary | ICD-10-CM | POA: Insufficient documentation

## 2014-10-14 DIAGNOSIS — J40 Bronchitis, not specified as acute or chronic: Secondary | ICD-10-CM | POA: Diagnosis not present

## 2014-10-14 DIAGNOSIS — Z8711 Personal history of peptic ulcer disease: Secondary | ICD-10-CM | POA: Insufficient documentation

## 2014-10-14 DIAGNOSIS — M7989 Other specified soft tissue disorders: Secondary | ICD-10-CM | POA: Diagnosis not present

## 2014-10-14 DIAGNOSIS — G629 Polyneuropathy, unspecified: Secondary | ICD-10-CM | POA: Diagnosis not present

## 2014-10-14 DIAGNOSIS — J449 Chronic obstructive pulmonary disease, unspecified: Secondary | ICD-10-CM | POA: Diagnosis not present

## 2014-10-14 DIAGNOSIS — K219 Gastro-esophageal reflux disease without esophagitis: Secondary | ICD-10-CM | POA: Insufficient documentation

## 2014-10-14 DIAGNOSIS — Z96653 Presence of artificial knee joint, bilateral: Secondary | ICD-10-CM | POA: Insufficient documentation

## 2014-10-14 DIAGNOSIS — Z853 Personal history of malignant neoplasm of breast: Secondary | ICD-10-CM | POA: Diagnosis not present

## 2014-10-14 DIAGNOSIS — R002 Palpitations: Secondary | ICD-10-CM | POA: Diagnosis not present

## 2014-10-14 DIAGNOSIS — E079 Disorder of thyroid, unspecified: Secondary | ICD-10-CM | POA: Diagnosis not present

## 2014-10-14 DIAGNOSIS — I10 Essential (primary) hypertension: Secondary | ICD-10-CM | POA: Diagnosis not present

## 2014-10-14 DIAGNOSIS — R0602 Shortness of breath: Secondary | ICD-10-CM | POA: Diagnosis not present

## 2014-10-14 DIAGNOSIS — I4891 Unspecified atrial fibrillation: Secondary | ICD-10-CM | POA: Insufficient documentation

## 2014-10-14 DIAGNOSIS — H2511 Age-related nuclear cataract, right eye: Secondary | ICD-10-CM | POA: Diagnosis not present

## 2014-10-14 DIAGNOSIS — I509 Heart failure, unspecified: Secondary | ICD-10-CM | POA: Diagnosis not present

## 2014-10-14 HISTORY — PX: CATARACT EXTRACTION W/PHACO: SHX586

## 2014-10-14 SURGERY — PHACOEMULSIFICATION, CATARACT, WITH IOL INSERTION
Anesthesia: Monitor Anesthesia Care | Laterality: Right | Wound class: Clean

## 2014-10-14 MED ORDER — TIMOLOL MALEATE 0.5 % OP SOLN
OPHTHALMIC | Status: DC | PRN
  Administered 2014-10-14: 1 [drp] via OPHTHALMIC

## 2014-10-14 MED ORDER — PROPARACAINE HCL 0.5 % OP SOLN
1.0000 [drp] | Freq: Once | OPHTHALMIC | Status: AC
  Administered 2014-10-14: 1 [drp] via OPHTHALMIC

## 2014-10-14 MED ORDER — ACETAMINOPHEN 325 MG PO TABS: 325.0000 mg | ORAL_TABLET | ORAL | Status: DC | PRN

## 2014-10-14 MED ORDER — BRIMONIDINE TARTRATE 0.2 % OP SOLN
OPHTHALMIC | Status: DC | PRN
  Administered 2014-10-14: 1 [drp] via OPHTHALMIC

## 2014-10-14 MED ORDER — BSS IO SOLN
INTRAOCULAR | Status: DC | PRN
  Administered 2014-10-14: 72 mL via OPHTHALMIC

## 2014-10-14 MED ORDER — POVIDONE-IODINE 5 % OP SOLN
1.0000 "application " | OPHTHALMIC | Status: DC | PRN
  Administered 2014-10-14: 1 via OPHTHALMIC

## 2014-10-14 MED ORDER — NA HYALUR & NA CHOND-NA HYALUR 0.4-0.35 ML IO KIT
PACK | INTRAOCULAR | Status: DC | PRN
  Administered 2014-10-14: 1 mL via INTRAOCULAR

## 2014-10-14 MED ORDER — DEXAMETHASONE SODIUM PHOSPHATE 4 MG/ML IJ SOLN
8.0000 mg | Freq: Once | INTRAMUSCULAR | Status: DC | PRN
Start: 2014-10-14 — End: 2014-10-14

## 2014-10-14 MED ORDER — LIDOCAINE HCL (PF) 4 % IJ SOLN
INTRAOCULAR | Status: DC | PRN
  Administered 2014-10-14: 11:00:00 via OPHTHALMIC

## 2014-10-14 MED ORDER — FENTANYL CITRATE (PF) 100 MCG/2ML IJ SOLN: 25.0000 ug | INTRAMUSCULAR | Status: DC | PRN

## 2014-10-14 MED ORDER — ACETAMINOPHEN 160 MG/5ML PO SOLN: 325.0000 mg | ORAL | Status: DC | PRN

## 2014-10-14 MED ORDER — CEFUROXIME OPHTHALMIC INJECTION 1 MG/0.1 ML
INJECTION | OPHTHALMIC | Status: DC | PRN
  Administered 2014-10-14: 0.1 mL via OPHTHALMIC

## 2014-10-14 MED ORDER — LACTATED RINGERS IV SOLN: 500.0000 mL | INTRAVENOUS | Status: DC

## 2014-10-14 MED ORDER — ARMC OPHTHALMIC DILATING GEL
1.0000 "application " | OPHTHALMIC | Status: DC | PRN
  Administered 2014-10-14 (×2): 1 via OPHTHALMIC

## 2014-10-14 MED ORDER — FENTANYL CITRATE (PF) 100 MCG/2ML IJ SOLN
INTRAMUSCULAR | Status: DC | PRN
  Administered 2014-10-14: 50 ug via INTRAVENOUS

## 2014-10-14 MED ORDER — LACTATED RINGERS IV SOLN: INTRAVENOUS | Status: DC

## 2014-10-14 MED ORDER — MIDAZOLAM HCL 2 MG/2ML IJ SOLN
INTRAMUSCULAR | Status: DC | PRN
  Administered 2014-10-14 (×2): 1 mg via INTRAVENOUS

## 2014-10-14 MED ORDER — OXYCODONE HCL 5 MG PO TABS: 5.0000 mg | ORAL_TABLET | Freq: Once | ORAL | Status: DC | PRN

## 2014-10-14 MED ORDER — OXYCODONE HCL 5 MG/5ML PO SOLN: 5.0000 mg | Freq: Once | ORAL | Status: DC | PRN

## 2014-10-14 SURGICAL SUPPLY — 26 items
CANNULA ANT/CHMB 27GA (MISCELLANEOUS) ×3 IMPLANT
GLOVE SURG LX 7.5 STRW (GLOVE) ×4
GLOVE SURG LX STRL 7.5 STRW (GLOVE) ×2 IMPLANT
GLOVE SURG TRIUMPH 8.0 PF LTX (GLOVE) ×3 IMPLANT
GOWN STRL REUS W/ TWL LRG LVL3 (GOWN DISPOSABLE) ×2 IMPLANT
GOWN STRL REUS W/TWL LRG LVL3 (GOWN DISPOSABLE) ×4
LENS IOL TECNIS 14.5 (Intraocular Lens) ×3 IMPLANT
LENS IOL TECNIS MONO 1P 14.5 (Intraocular Lens) ×1 IMPLANT
MARKER SKIN SURG W/RULER VIO (MISCELLANEOUS) ×3 IMPLANT
NDL RETROBULBAR .5 NSTRL (NEEDLE) IMPLANT
NEEDLE FILTER BLUNT 18X 1/2SAF (NEEDLE) ×2
NEEDLE FILTER BLUNT 18X1 1/2 (NEEDLE) ×1 IMPLANT
PACK CATARACT BRASINGTON (MISCELLANEOUS) ×3 IMPLANT
PACK EYE AFTER SURG (MISCELLANEOUS) ×3 IMPLANT
PACK OPTHALMIC (MISCELLANEOUS) ×3 IMPLANT
RING MALYGIN 7.0 (MISCELLANEOUS) IMPLANT
SUT ETHILON 10-0 CS-B-6CS-B-6 (SUTURE)
SUT VICRYL  9 0 (SUTURE)
SUT VICRYL 9 0 (SUTURE) IMPLANT
SUTURE EHLN 10-0 CS-B-6CS-B-6 (SUTURE) IMPLANT
SYR 3ML LL SCALE MARK (SYRINGE) ×3 IMPLANT
SYR 5ML LL (SYRINGE) IMPLANT
SYR TB 1ML LUER SLIP (SYRINGE) ×3 IMPLANT
WATER STERILE IRR 250ML POUR (IV SOLUTION) ×3 IMPLANT
WATER STERILE IRR 500ML POUR (IV SOLUTION) ×3 IMPLANT
WIPE NON LINTING 3.25X3.25 (MISCELLANEOUS) ×3 IMPLANT

## 2014-10-14 NOTE — H&P (Signed)
  The History and Physical notes were scanned in.  The patient remains stable and unchanged from the H&P.   Previous H&P reviewed, patient examined, and there are no changes.  Tonya Walton 10/14/2014 9:42 AM

## 2014-10-14 NOTE — Transfer of Care (Signed)
Immediate Anesthesia Transfer of Care Note  Patient: Tonya Walton  Procedure(s) Performed: Procedure(s): CATARACT EXTRACTION PHACO AND INTRAOCULAR LENS PLACEMENT (IOC) (Right)  Patient Location: PACU  Anesthesia Type: MAC  Level of Consciousness: awake, alert  and patient cooperative  Airway and Oxygen Therapy: Patient Spontanous Breathing and Patient connected to supplemental oxygen  Post-op Assessment: Post-op Vital signs reviewed, Patient's Cardiovascular Status Stable, Respiratory Function Stable, Patent Airway and No signs of Nausea or vomiting  Post-op Vital Signs: Reviewed and stable  Complications: No apparent anesthesia complications

## 2014-10-14 NOTE — Anesthesia Preprocedure Evaluation (Addendum)
Anesthesia Evaluation  Patient identified by MRN, date of birth, ID band Patient awake    Reviewed: Allergy & Precautions, H&P , NPO status , Patient's Chart, lab work & pertinent test results, reviewed documented beta blocker date and time   Airway Mallampati: II  TM Distance: >3 FB Neck ROM: full    Dental no notable dental hx.    Pulmonary shortness of breath and with exertion, COPD,  COPD inhaler, former smoker,    Pulmonary exam normal breath sounds clear to auscultation       Cardiovascular Exercise Tolerance: Good hypertension, +CHF  + dysrhythmias Atrial Fibrillation  Rhythm:regular Rate:Normal     Neuro/Psych  Headaches, PSYCHIATRIC DISORDERS  Neuromuscular disease    GI/Hepatic Neg liver ROS, GERD  Medicated,  Endo/Other  Hypothyroidism   Renal/GU negative Renal ROS  negative genitourinary   Musculoskeletal   Abdominal   Peds  Hematology negative hematology ROS (+)   Anesthesia Other Findings   Reproductive/Obstetrics negative OB ROS                            Anesthesia Physical Anesthesia Plan  ASA: III  Anesthesia Plan: MAC   Post-op Pain Management:    Induction:   Airway Management Planned:   Additional Equipment:   Intra-op Plan:   Post-operative Plan:   Informed Consent: I have reviewed the patients History and Physical, chart, labs and discussed the procedure including the risks, benefits and alternatives for the proposed anesthesia with the patient or authorized representative who has indicated his/her understanding and acceptance.     Plan Discussed with: CRNA  Anesthesia Plan Comments:        Anesthesia Quick Evaluation

## 2014-10-14 NOTE — Op Note (Signed)
LOCATION:  [facility]   PREOPERATIVE DIAGNOSIS:    Nuclear sclerotic cataract right eye. H25.11   POSTOPERATIVE DIAGNOSIS:  Nuclear sclerotic cataract right eye.     PROCEDURE:  Phacoemusification with posterior chamber intraocular lens placement of the right eye   LENS:   Implant Name Type Inv. Item Serial No. Manufacturer Lot No. LRB No. Used  LENS IOL TECNIS 14.5 - [id number] Intraocular Lens LENS IOL TECNIS 14.5 [id number] AMO   Right 1        ULTRASOUND TIME: 12 % of 1 minutes, 18 seconds.  CDE 9.6   SURGEON:  Wyonia Hough, MD   ANESTHESIA:  Topical with tetracaine drops and 2% Xylocaine jelly.   COMPLICATIONS:  None.   DESCRIPTION OF PROCEDURE:  The patient was identified in the holding room and transported to the operating room and placed in the supine position under the operating microscope.  The right eye was identified as the operative eye and it was prepped and draped in the usual sterile ophthalmic fashion.   A 1 millimeter clear-corneal paracentesis was made at the 12:00 position.  The anterior chamber was filled with Viscoat viscoelastic.  A 2.4 millimeter keratome was used to make a near-clear corneal incision at the 9:00 position.  A curvilinear capsulorrhexis was made with a cystotome and capsulorrhexis forceps.  Balanced salt solution was used to hydrodissect and hydrodelineate the nucleus.   Phacoemulsification was then used in stop and chop fashion to remove the lens nucleus and epinucleus.  The remaining cortex was then removed using the irrigation and aspiration handpiece. Provisc was then placed into the capsular bag to distend it for lens placement.  A lens was then injected into the capsular bag.  The remaining viscoelastic was aspirated.   Wounds were hydrated with balanced salt solution.  The anterior chamber was inflated to a physiologic pressure with balanced salt solution.  No wound leaks were noted. Cefuroxime 0.1 ml of a 10mg /ml  solution was injected into the anterior chamber for a dose of 1 mg of intracameral antibiotic at the completion of the case.   Timolol and Brimonidine drops were applied to the eye.  The patient was taken to the recovery room in stable condition without complications of anesthesia or surgery.   Avaleen Brownley 10/14/2014, 10:45 AM

## 2014-10-14 NOTE — Anesthesia Postprocedure Evaluation (Signed)
  Anesthesia Post-op Note  Patient: Tonya Walton  Procedure(s) Performed: Procedure(s): CATARACT EXTRACTION PHACO AND INTRAOCULAR LENS PLACEMENT (IOC) (Right)  Anesthesia type:MAC  Patient location: PACU  Post pain: Pain level controlled  Post assessment: Post-op Vital signs reviewed, Patient's Cardiovascular Status Stable, Respiratory Function Stable, Patent Airway and No signs of Nausea or vomiting  Post vital signs: Reviewed and stable  Last Vitals:  Filed Vitals:   10/14/14 1046  BP: 132/69  Pulse: 67  Temp: 36.5 C  Resp: 15    Level of consciousness: awake, alert  and patient cooperative  Complications: No apparent anesthesia complications

## 2014-10-15 ENCOUNTER — Encounter: Payer: Self-pay | Admitting: Ophthalmology

## 2014-10-20 ENCOUNTER — Ambulatory Visit (INDEPENDENT_AMBULATORY_CARE_PROVIDER_SITE_OTHER): Payer: PPO | Admitting: Psychiatry

## 2014-10-20 ENCOUNTER — Other Ambulatory Visit: Payer: Self-pay | Admitting: Psychiatry

## 2014-10-20 ENCOUNTER — Encounter: Payer: Self-pay | Admitting: Psychiatry

## 2014-10-20 VITALS — BP 138/74 | HR 78 | Temp 98.2°F | Ht 66.0 in | Wt 233.4 lb

## 2014-10-20 DIAGNOSIS — F339 Major depressive disorder, recurrent, unspecified: Secondary | ICD-10-CM | POA: Insufficient documentation

## 2014-10-20 DIAGNOSIS — G4700 Insomnia, unspecified: Secondary | ICD-10-CM | POA: Insufficient documentation

## 2014-10-20 DIAGNOSIS — E782 Mixed hyperlipidemia: Secondary | ICD-10-CM | POA: Insufficient documentation

## 2014-10-20 DIAGNOSIS — G2581 Restless legs syndrome: Secondary | ICD-10-CM | POA: Insufficient documentation

## 2014-10-20 DIAGNOSIS — F411 Generalized anxiety disorder: Secondary | ICD-10-CM | POA: Insufficient documentation

## 2014-10-20 DIAGNOSIS — F331 Major depressive disorder, recurrent, moderate: Secondary | ICD-10-CM | POA: Insufficient documentation

## 2014-10-20 DIAGNOSIS — I10 Essential (primary) hypertension: Secondary | ICD-10-CM | POA: Insufficient documentation

## 2014-10-20 DIAGNOSIS — F419 Anxiety disorder, unspecified: Secondary | ICD-10-CM | POA: Insufficient documentation

## 2014-10-20 DIAGNOSIS — F322 Major depressive disorder, single episode, severe without psychotic features: Secondary | ICD-10-CM | POA: Insufficient documentation

## 2014-10-20 MED ORDER — ZOLPIDEM TARTRATE 5 MG PO TABS: 5.0000 mg | ORAL_TABLET | Freq: Every day | ORAL | Status: DC

## 2014-10-20 MED ORDER — FLUOXETINE HCL 40 MG PO CAPS: 40.0000 mg | ORAL_CAPSULE | Freq: Every day | ORAL | Status: DC

## 2014-10-20 MED ORDER — CLONAZEPAM 0.5 MG PO TABS: 0.5000 mg | ORAL_TABLET | Freq: Every day | ORAL | Status: DC

## 2014-10-20 NOTE — Progress Notes (Signed)
BH MD/PA/NP OP Progress Note  10/20/2014 9:13 AM Tonya Walton  MRN:  [id number]  Subjective:  Pt is a 64 year old married female who presented for the follow-up. She has history of depression and came back after 3 months as she reported that she had bilateral knee surgery as well as the Surgery and is recuperating from them. She reported that she is doing well and does not have any pain and is not taking any pain medications. She has completed her physical therapy. She is still exercising and is doing well. Patient reported that she is compliant with her medications. She currently denied having any worsening of her mood or anxiety symptoms. She appears happy and was explaining all the symptoms in detail. She denied having any suicidal ideations or plans. She lives with her husband and they have good relationship with each other. Patient currently denied having any adverse effects of the medications. She reported that her memory is worse so we discussed about decreasing the dose of the Klonopin.  Chief Complaint:  Chief Complaint    Follow-up; Medication Refill     Visit Diagnosis:     ICD-9-CM ICD-10-CM   1. MDD (major depressive disorder), recurrent episode, moderate 296.32 F33.1     Past Medical History:  Past Medical History  Diagnosis Date  . Anxiety   . Depression   . Dysrhythmia     Afib  . CHF (congestive heart failure)   . Hypertension   . COPD (chronic obstructive pulmonary disease)   . Hypothyroidism   . GERD (gastroesophageal reflux disease)   . Headache   . 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 Arthritis   . Cancer     right breast  . Atrial fibrillation, controlled   . Shortness of breath dyspnea   . Restless leg syndrome     Past Surgical History  Procedure Laterality Date  . Breast lumpectomy      Right breast  . Appendectomy    . Tonsillectomy    . Total knee arthroplasty Right 06/10/2014    Procedure: TOTAL KNEE ARTHROPLASTY;  Surgeon:  Dereck Leep, MD;  Location: [facility];  Service: Orthopedics;  Laterality: Right;  . Joint replacement Right     Total Knee Replacement  . Total knee arthroplasty Left 09/02/2014    Procedure: TOTAL KNEE ARTHROPLASTY;  Surgeon: Dereck Leep, MD;  Location: [facility];  Service: Orthopedics;  Laterality: Left;  . Cataract extraction w/phaco Right 10/14/2014    Procedure: CATARACT EXTRACTION PHACO AND INTRAOCULAR LENS PLACEMENT (IOC);  Surgeon: Leandrew Koyanagi, MD;  Location: [place];  Service: Ophthalmology;  Laterality: Right;   Family History:  Family History  Problem Relation Age of Onset  . Leukemia Mother   . Bone cancer Father   . Heart disease Brother    Social History:  Social History   Social History  . Marital Status: Married    Spouse Name: N/A  . Number of Children: N/A  . Years of Education: N/A   Social History Main Topics  . Smoking status: Former Smoker -- 1.00 packs/day    Types: Cigarettes    Quit date: 04/30/1992  . Smokeless tobacco: Never Used  . Alcohol Use: No  . Drug Use: No  . Sexual Activity: No   Other Topics Concern  . None   Social History Narrative   Additional History:  She currently lives with her husband who is very helpful and has been taking  care of her during her surgery. She reported that he is a good  nurse  Assessment:   Musculoskeletal: Strength & Muscle Tone: within normal limits Gait & Station: normal Patient leans: N/A  Psychiatric Specialty Exam: HPI   Review of Systems  Constitutional: Negative for chills.  HENT: Negative for congestion and tinnitus.   Eyes: Negative for double vision.  Respiratory: Negative for hemoptysis.   Cardiovascular: Negative for palpitations.  Gastrointestinal: Positive for diarrhea. Negative for nausea.  Genitourinary: Negative for urgency.  Musculoskeletal: Positive for joint pain. Negative for back pain.  Skin: Negative for rash.  Neurological: Negative for sensory  change.  Endo/Heme/Allergies: Negative for environmental allergies.  Psychiatric/Behavioral: Positive for depression. Negative for suicidal ideas and substance abuse. The patient does not have insomnia.     Blood pressure 138/74, pulse 78, temperature 98.2 F (36.8 C), temperature source Tympanic, height 5\' 6"  (1.676 m), weight 233 lb 6.4 oz (105.87 kg), SpO2 98 %.Body mass index is 37.69 kg/(m^2).  General Appearance: Casual  Eye Contact:  Fair  Speech:  Normal Rate  Volume:  Normal  Mood:  Euthymic  Affect:  Appropriate  Thought Process:  Coherent  Orientation:  Full (Time, Place, and Person)  Thought Content:  WDL  Suicidal Thoughts:  No  Homicidal Thoughts:  No  Memory:  Immediate;   Fair  Judgement:  Fair  Insight:  Fair  Psychomotor Activity:  Normal  Concentration:  Fair  Recall:  [employer] of Knowledge: Fair  Language: Fair  Akathisia:  No  Handed:  Right  AIMS (if indicated):  none  Assets:  Communication Skills Desire for Improvement Social Support  ADL's:  Intact  Cognition: WNL  Sleep:  8-10   Is the patient at risk to self?  No. Has the patient been a risk to self in the past 6 months?  No. Has the patient been a risk to self within the distant past?  No. Is the patient a risk to others?  No. Has the patient been a risk to others in the past 6 months?  No. Has the patient been a risk to others within the distant past?  No.  Current Medications: Current Outpatient Prescriptions  Medication Sig Dispense Refill  . acetaminophen (TYLENOL) 500 MG tablet Take 1,000 mg by mouth at bedtime.    Marland Kitchen anastrozole (ARIMIDEX) 1 MG tablet Take 1 mg by mouth at bedtime.    . clonazePAM (KLONOPIN) 1 MG tablet Take 1 tablet (1 mg total) by mouth at bedtime. 30 tablet 2  . Cyanocobalamin ([employer] B-12) 1000 MCG/15ML LIQD Take by mouth.    . cyclobenzaprine (FLEXERIL) 10 MG tablet Take 10 mg by mouth 2 (two) times daily as needed for muscle spasms.    . diphenhydrAMINE  (BENADRYL) 25 MG tablet Take 25 mg by mouth at bedtime.    Marland Kitchen esomeprazole (NEXIUM) 40 MG capsule Take 80 mg by mouth.    Marland Kitchen FLUoxetine (PROZAC) 40 MG capsule Take 1 capsule (40 mg total) by mouth daily. 90 capsule 1  . furosemide (LASIX) 40 MG tablet Take 40 mg by mouth daily.    Marland Kitchen gabapentin (NEURONTIN) 300 MG capsule Take 1,200 mg by mouth 2 (two) times daily. 1200 mg AM, 1500 mg PM    . ketorolac (ACULAR) 0.4 % SOLN BEGINNING 2 DAYS BEFORE SURGERY, INSTILL 1 DROP 4 TIMES A DAY INTO AFFECTED EYE THEN 1 DROP AM OF.  0  . levothyroxine (SYNTHROID, LEVOTHROID) 25 MCG tablet Take 25  mcg by mouth daily.     . meloxicam (MOBIC) 15 MG tablet Take 15 mg by mouth daily.    . metoprolol succinate (TOPROL-XL) 50 MG 24 hr tablet Take 50 mg by mouth every morning.     . montelukast (SINGULAIR) 10 MG tablet Take 10 mg by mouth at bedtime.    Marland Kitchen oxybutynin (DITROPAN) 5 MG tablet Take 5 mg by mouth 3 (three) times daily as needed for bladder spasms.    . pantoprazole (PROTONIX) 40 MG tablet Take 40 mg by mouth daily. AM    . pramipexole (MIRAPEX) 0.25 MG tablet Take 0.25-0.5 mg by mouth 3 (three) times daily. Pt takes two tablets in the morning, one tablet at noon, and two tablets at bedtime.    . sertraline (ZOLOFT) 100 MG tablet Take 200 mg by mouth.    Marland Kitchen VIGAMOX 0.5 % ophthalmic solution BEGINNING 2 DAYS PRIOR SURGERY, 1 DROP 4 TIMES A DAY INTO AFFECTED EYE AND 1 DROP AM OF SURGERY  0  . zolpidem (AMBIEN) 5 MG tablet Take 1 tablet (5 mg total) by mouth at bedtime. 90 tablet 1   No current facility-administered medications for this visit.    Medical Decision Making:  Established Problem, Stable/Improving (1) and Review of Last Therapy Session (1)  Treatment Plan Summary:Medication management  Depression Continue on Prozac 40 mg in the morning  Anxiety Discussed about decreasing the dose of Klonopin to 0.5 mg at bedtime as she is also having issues with her memory and patient agreed with the  plan  Sleeping problems  Patient will continue on zolpidem 5 mg at bedtime and she was given 90 day supply of the medication  Follow-up She will follow-up in 3 months or earlier      More than 50% of the time spent in psychoeducation, counseling and coordination of care.  Time spent with the patient 25 minutes   This note was generated in part or whole with voice recognition software. Voice regonition is usually quite accurate but there are transcription errors that can and very often do occur. I apologize for any typographical errors that were not detected and corrected.    Rainey Pines 10/20/2014, 9:13 AM

## 2014-10-21 ENCOUNTER — Ambulatory Visit
Admission: RE | Admit: 2014-10-21 | Discharge: 2014-10-21 | Disposition: A | Discharge status: Home / Self Care | Payer: PPO | Source: Ambulatory Visit | Attending: Internal Medicine | Admitting: Internal Medicine

## 2014-10-21 ENCOUNTER — Other Ambulatory Visit: Payer: Self-pay | Admitting: Internal Medicine

## 2014-10-21 DIAGNOSIS — Z853 Personal history of malignant neoplasm of breast: Secondary | ICD-10-CM

## 2014-10-21 DIAGNOSIS — M85861 Other specified disorders of bone density and structure, right lower leg: Secondary | ICD-10-CM | POA: Diagnosis not present

## 2014-10-21 DIAGNOSIS — C50911 Malignant neoplasm of unspecified site of right female breast: Secondary | ICD-10-CM

## 2014-10-28 NOTE — Progress Notes (Signed)
[facility]  [phone] [phone]     ID: Tonya Walton OB: 11/12/1950  MR#: [id number]  LXB#:[id number]  Patient Care Team: Rusty Aus, MD as PCP - General (Internal Medicine)  CHIEF COMPLAINT/DIAGNOSIS:  1. Stage I  (pT1a pN0sn cM0) grade 3 invasive mammary carcinoma of the right breast s/p wide local excision and sentinel node biopsy on 09/27/10, anterior margin was positive, then had re-excision. ER/PR positive. HER-2/neu 2+. Completed breast irradiation on 02/27/11.  On aromatase inhibitor anastrozole since February 2013.  2. 08/06/13 - LEFT BREAST AT 12:00, ULTRASOUND-GUIDED CORE BIOPSY: ADIPOSE TISSUE CONTAINING A FEW GROUPS OF FOAMY MACROPHAGES. NOTE: No mammary ducts or lobules are present in this sample. Clinical correlation is recommended.       HISTORY OF PRESENT ILLNESS:  Patient returns for oncology followup, she was seen one year ago. She has completed breast radiation and is on aromatase inhibitor anastrozole since February 2013.  States that she is doing well without any new side effects including joint pain/stiffness or bothersome hot flashes.  Denies history of osteoporosis. Appetite and weight are steady. She had recent left breast bx after abnormal mammogram which was reported as ruptured cyst, no malignancy.No new bone pains.  REVIEW OF SYSTEMS:   ROS As in HPI above. In addition, no fever, chills or sweats. No new headaches or focal weakness.  No new mood disturbances. No  sore throat, cough, shortness of breath, sputum, hemoptysis or chest pain. No dizziness or palpitation. No abdominal pain, constipation, diarrhea, dysuria or hematuria. No new skin rash or bleeding symptoms. No new paresthesias in extremities.  Otherwise, a complete review of systems is negative.  PAST MEDICAL HISTORY: Reviewed. Past Medical History  Diagnosis Date  . Anxiety   . Depression   . Dysrhythmia     Afib  . CHF (congestive heart failure)   .  Hypertension   . COPD (chronic obstructive pulmonary disease)   . Hypothyroidism   . GERD (gastroesophageal reflux disease)   . Headache   . 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 Arthritis   . Cancer     right breast  . Atrial fibrillation, controlled   . Shortness of breath dyspnea   . Restless leg syndrome   Stage I invasive mammary carcinoma of the right breast s/p wide local excision and sentinel node biopsy on 09/27/10, anterior margin was positive, then had re-excision. ER and PR positive. HER-2/neu 2+  PAST SURGICAL HISTORY: Reviewed. Past Surgical History  Procedure Laterality Date  . Breast lumpectomy      Right breast  . Appendectomy    . Tonsillectomy    . Total knee arthroplasty Right 06/10/2014    Procedure: TOTAL KNEE ARTHROPLASTY;  Surgeon: Dereck Leep, MD;  Location: [facility];  Service: Orthopedics;  Laterality: Right;  . Joint replacement Right     Total Knee Replacement  . Total knee arthroplasty Left 09/02/2014    Procedure: TOTAL KNEE ARTHROPLASTY;  Surgeon: Dereck Leep, MD;  Location: [facility];  Service: Orthopedics;  Laterality: Left;  . Cataract extraction w/phaco Right 10/14/2014    Procedure: CATARACT EXTRACTION PHACO AND INTRAOCULAR LENS PLACEMENT (IOC);  Surgeon: Leandrew Koyanagi, MD;  Location: [place];  Service: Ophthalmology;  Laterality: Right;  . Breast excisional biopsy Right 2012    +  . Breast biopsy Left 07/2013    -    FAMILY HISTORY: Reviewed. Family History  Problem Relation Age of  Onset  . Leukemia Mother   . Bone cancer Father   . Heart disease Brother     SOCIAL HISTORY: Reviewed. Social History  Substance Use Topics  . Smoking status: Former Smoker -- 1.00 packs/day    Types: Cigarettes    Quit date: 04/30/1992  . Smokeless tobacco: Never Used  . Alcohol Use: No    No Known Allergies  Current Outpatient Prescriptions  Medication Sig Dispense Refill  . acetaminophen (TYLENOL) 500 MG tablet  Take 1,000 mg by mouth at bedtime.    Marland Kitchen anastrozole (ARIMIDEX) 1 MG tablet Take 1 mg by mouth at bedtime.    . cyclobenzaprine (FLEXERIL) 10 MG tablet Take 10 mg by mouth 2 (two) times daily as needed for muscle spasms.    . diphenhydrAMINE (BENADRYL) 25 MG tablet Take 25 mg by mouth at bedtime.    . furosemide (LASIX) 40 MG tablet Take 40 mg by mouth daily.    Marland Kitchen gabapentin (NEURONTIN) 300 MG capsule Take 1,200 mg by mouth 2 (two) times daily. 1200 mg AM, 1500 mg PM    . levothyroxine (SYNTHROID, LEVOTHROID) 25 MCG tablet Take 25 mcg by mouth daily.     . meloxicam (MOBIC) 15 MG tablet Take 15 mg by mouth daily.    . metoprolol succinate (TOPROL-XL) 50 MG 24 hr tablet Take 50 mg by mouth every morning.     . montelukast (SINGULAIR) 10 MG tablet Take 10 mg by mouth at bedtime.    Marland Kitchen oxybutynin (DITROPAN) 5 MG tablet Take 5 mg by mouth 3 (three) times daily as needed for bladder spasms.    . pantoprazole (PROTONIX) 40 MG tablet Take 40 mg by mouth daily. AM    . pramipexole (MIRAPEX) 0.25 MG tablet Take 0.25-0.5 mg by mouth 3 (three) times daily. Pt takes two tablets in the morning, one tablet at noon, and two tablets at bedtime.    . clonazePAM (KLONOPIN) 0.5 MG tablet Take 1 tablet (0.5 mg total) by mouth at bedtime. 30 tablet 2  . Cyanocobalamin ([employer]S B-12) 1000 MCG/15ML LIQD Take by mouth.    . esomeprazole (NEXIUM) 40 MG capsule Take 80 mg by mouth.    Marland Kitchen FLUoxetine (PROZAC) 40 MG capsule Take 1 capsule (40 mg total) by mouth daily. 90 capsule 1  . ketorolac (ACULAR) 0.4 % SOLN BEGINNING 2 DAYS BEFORE SURGERY, INSTILL 1 DROP 4 TIMES A DAY INTO AFFECTED EYE THEN 1 DROP AM OF.  0  . sertraline (ZOLOFT) 100 MG tablet Take 200 mg by mouth.    Marland Kitchen VIGAMOX 0.5 % ophthalmic solution BEGINNING 2 DAYS PRIOR SURGERY, 1 DROP 4 TIMES A DAY INTO AFFECTED EYE AND 1 DROP AM OF SURGERY  0  . zolpidem (AMBIEN) 5 MG tablet Take 1 tablet (5 mg total) by mouth at bedtime. 90 tablet 1   No current  facility-administered medications for this visit.    PHYSICAL EXAM: Filed Vitals:   10/12/14 1207  BP: 119/77  Pulse: 73  Temp: 96.6 F (35.9 C)  Resp: 18     Body mass index is 37.38 kg/(m^2).    ECOG FS:0 - Asymptomatic  GENERAL: Patient is alert and oriented and in no acute distress. There is no icterus. HEENT: EOMs intact. No cervical lymphadenopathy. CVS: S1S2, regular LUNGS: Bilaterally clear to auscultation, no rhonchi. ABDOMEN: Soft, nontender. No hepatosplenomegaly clinically.  EXTREMITIES: No pedal edema. BREASTS: scar tissue present in right breast, no dominant masses otherwise.  Left breast negative for masses or  tenderness.  No axillary adenopathy on either side.  Exam performed in presence of a nurse.   LAB RESULTS:    Component Value Date/Time   NA 143 09/03/2014 0425   NA 141 05/27/2014 0840   K 4.1 09/03/2014 0425   K 4.1 05/27/2014 0840   CL 108 09/03/2014 0425   CL 107 05/27/2014 0840   CO2 27 09/03/2014 0425   CO2 25 05/27/2014 0840   GLUCOSE 107* 09/03/2014 0425   GLUCOSE 154* 05/27/2014 0840   BUN 9 09/03/2014 0425   BUN 26* 05/27/2014 0840   CREATININE 0.67 09/03/2014 0425   CREATININE 0.75 05/27/2014 0840   CALCIUM 8.8* 09/03/2014 0425   CALCIUM 8.9 05/27/2014 0840   PROT 8.3* 08/11/2013 1122   ALBUMIN 3.5 08/11/2013 1122   AST 14* 08/11/2013 1122   ALT 16 08/11/2013 1122   ALKPHOS 100 08/11/2013 1122   BILITOT 0.3 08/11/2013 1122   GFRNONAA >60 09/03/2014 0425   GFRNONAA >60 05/27/2014 0840   GFRAA >60 09/03/2014 0425   GFRAA >60 05/27/2014 0840    Lab Results  Component Value Date   WBC 8.0 09/05/2014   NEUTROABS 5.1 08/11/2013   HGB 10.6* 09/05/2014   HCT 31.6* 09/05/2014   MCV 86.6 09/05/2014   PLT 204 09/05/2014    STUDIES: 07/30/13 - Mammogram/ultrasound. IMPRESSION:  Indeterminate 2 x 4 x 3 mm slightly irregular hypoechoic mass in the upper left breast. Although this may represent an area of fat necrosis, tissue sampling is  recommended to exclude neoplasm. This may or may not represent the nodule with coarse calcifications identified mammographically and can be further assessed on post biopsy clip films. If the area biopsied sonographically does not correlate to the mammographic finding, then stereotactic guided biopsy of the left breast nodule would be recommended. Benign and likely benign nodular densities within the right breast. Six month followup is recommended to ensure stability. Right breast scarring. RECOMMENDATION: Ultrasound-guided left breast biopsy which will be scheduled by our office and the patient informed. Right diagnostic mammogram in 6 months.  BI-RADS CATEGORY  4: Suspicious.  08/06/13 - Pathology Report. LEFT BREAST MASS, SUSPECT RUPTURED CYST/DUCT AS IT COLLAPSED IMMEDIATELY. Diagnosis:  LEFT BREAST AT 12:00, ULTRASOUND-GUIDED CORE BIOPSY:  ADIPOSE TISSUE CONTAINING A FEW GROUPS OF FOAMY MACROPHAGES. NOTE: No mammary ducts or lobules are present in this sample. Clinical correlation is recommended.   ASSESSMENT / PLAN:   1. Stage I  (pT1a pN0sn cM0) grade 3 invasive mammary carcinoma of the right breast s/p wide local excision and sentinel node biopsy on 09/27/10, anterior margin was positive, then had re-excision. ER/PR positive. HER-2/neu 2+. Completed breast irradiation on 02/27/11. On aromatase inhibitor anastrozole since February 2013  -  doing steady with no clinical evidence to suggest recurrent or metastatic right breast cancer.  She is on hormonal therapy with anastrozole since February 2013, tolerating this well without any side effects, she was advised that she will need to take this for a duration of 5 years. Patient is due for mammogram, we will schedule this next week. If mammogram is unremarkable the plan is to continue surveillance and will see her back in one year with CBC, Cr, LFT. 2. Left breast abnormality noted on mammogram July 2015 - biopsy by Radiology was negative on 08/06/13 for  maiglnancy. Per d/w radiologist, there is a separate area of concenr in left breast and they recommend biopsy of this also. Patient decided not to get this done. , she will think about  this.   3. Osteoporosis surveillance - DEXA scan Oct 2013 was unremarkable. Patient advised to take calcium plus vitamin D twice a day. Will get repeat DEXA scan for continued surveillance of osteoporosis since she is on aromatase inhibitor. 4. In between visits, the patient has been advised to call in case of any new breast masses felt on self-exam, side effects from anastrozole, or new symptoms. She is agreeable to this plan.      Leia Alf, MD   10/28/2014 11:57 PM

## 2014-10-29 ENCOUNTER — Telehealth: Payer: Self-pay | Admitting: *Deleted

## 2014-10-29 NOTE — Telephone Encounter (Signed)
Per Dr. Simon Rhein scan reports osteopenia with a T-score of -1.4. Please advise patient to take Calcium + vitamin D twice a day. Also please forward the report to her primary physician for their review and further follow-up/treatment as indicated."  Spoke with patient. Advised patient to take Calcium 600mg /Vit. D 400 mg two tablets daily for her osteopenia. Results reviewed with patient. Report sent to primary care for further interventions/tx. Teach back process performed with patient.

## 2014-12-22 ENCOUNTER — Encounter: Payer: Self-pay | Admitting: *Deleted

## 2014-12-22 ENCOUNTER — Other Ambulatory Visit: Payer: Self-pay | Admitting: *Deleted

## 2014-12-22 NOTE — Patient Outreach (Signed)
[facility]) Care Management  12/22/2014  Tonya Walton [birth year] [id number]   RN CM attempted  1st outreach call to patient.  Patient was unavailable. No answering machine pick up. [facility] Care Management [phone]

## 2014-12-22 NOTE — Patient Outreach (Signed)
[facility]) Care Management  12/22/2014  Tonya Walton 10/13/1950 [id number]   RN Health Coach telephone to patient. Tier 3 referral screening.  RN Health Coach spoke with patient and described the services available through [employer] Patient declined service. Per patient she states she is not on any inhalers and that she does not need this service at this time.  RN Health Coach will send an outreach packet to patient.  [facility] Care Management [phone]

## 2015-01-12 ENCOUNTER — Ambulatory Visit (INDEPENDENT_AMBULATORY_CARE_PROVIDER_SITE_OTHER): Payer: PPO | Admitting: Psychiatry

## 2015-01-12 ENCOUNTER — Encounter: Payer: Self-pay | Admitting: Psychiatry

## 2015-01-12 VITALS — BP 122/86 | HR 69 | Temp 97.1°F | Ht 66.0 in | Wt 231.6 lb

## 2015-01-12 DIAGNOSIS — F330 Major depressive disorder, recurrent, mild: Secondary | ICD-10-CM | POA: Diagnosis not present

## 2015-01-12 MED ORDER — CLONAZEPAM 0.5 MG PO TABS: 0.5000 mg | ORAL_TABLET | Freq: Every day | ORAL | Status: DC

## 2015-01-12 MED ORDER — FLUOXETINE HCL 40 MG PO CAPS: 40.0000 mg | ORAL_CAPSULE | Freq: Every day | ORAL | Status: DC

## 2015-01-12 MED ORDER — ZOLPIDEM TARTRATE 5 MG PO TABS: 5.0000 mg | ORAL_TABLET | Freq: Every day | ORAL | Status: DC

## 2015-01-12 NOTE — Progress Notes (Signed)
BH MD/PA/NP OP Progress Note  01/12/2015 8:50 AM Tonya Walton  MRN:  [id number]  Subjective:  Pt is a 64 year old married female who presented for the follow-up. She has history of depression and came back after 2 months. She reported that she has started improving on her medications. She reported that her knees are improving and she is going to the pool 3-4 times per week with her husband and is feeling much better as her joints are improving. Patient reported that her depressive symptoms are also improved. She is sleeping much better since the dose of zolpidem was decreased to 5 mg. She reported that she does not stutter while talking. Patient reported that her memory has significantly improved since the dose of clonazepam was decreased to 0.5 mg at bedtime. She appeared very calm during the interview. She was talking about her niece who is blind due to brain tumor but has graduated from college. She reported that she is planning to spend the holidays with her family and is looking forward to the same.    Chief Complaint:  Chief Complaint    Follow-up; Medication Refill     Visit Diagnosis:     ICD-9-CM ICD-10-CM   1. MDD (major depressive disorder), recurrent episode, mild (HCC) 296.31 F33.0     Past Medical History:  Past Medical History  Diagnosis Date  . Anxiety   . Depression   . Dysrhythmia     Afib  . CHF (congestive heart failure) ([place])   . Hypertension   . COPD (chronic obstructive pulmonary disease) ([place])   . Hypothyroidism   . GERD (gastroesophageal reflux disease)   . Headache   . Neuromuscular disorder ([place])     nerve damage in legs  . Arthritis   . Cancer [facility])     right breast  . Atrial fibrillation, controlled ([place])   . Shortness of breath dyspnea   . Restless leg syndrome     Past Surgical History  Procedure Laterality Date  . Breast lumpectomy      Right breast  . Appendectomy    . Tonsillectomy    . Total knee arthroplasty Right 06/10/2014     Procedure: TOTAL KNEE ARTHROPLASTY;  Surgeon: Dereck Leep, MD;  Location: [facility] ORS;  Service: Orthopedics;  Laterality: Right;  . Joint replacement Right     Total Knee Replacement  . Total knee arthroplasty Left 09/02/2014    Procedure: TOTAL KNEE ARTHROPLASTY;  Surgeon: Dereck Leep, MD;  Location: [facility] ORS;  Service: Orthopedics;  Laterality: Left;  . Cataract extraction w/phaco Right 10/14/2014    Procedure: CATARACT EXTRACTION PHACO AND INTRAOCULAR LENS PLACEMENT (IOC);  Surgeon: Leandrew Koyanagi, MD;  Location: [place];  Service: Ophthalmology;  Laterality: Right;  . Breast excisional biopsy Right 2012    +  . Breast biopsy Left 07/2013    -   Family History:  Family History  Problem Relation Age of Onset  . Leukemia Mother   . Bone cancer Father   . Heart disease Brother   . Heart disease Brother   . Diabetes Brother   . Hypertension Brother   . Diabetes Brother   . Hypertension Brother   . Heart disease Brother    Social History:  Social History   Social History  . Marital Status: Married    Spouse Name: N/A  . Number of Children: N/A  . Years of Education: N/A   Social History Main Topics  . Smoking status: Former Smoker --  1.00 packs/day    Types: Cigarettes    Quit date: 04/30/1992  . Smokeless tobacco: Never Used  . Alcohol Use: No  . Drug Use: No  . Sexual Activity: No   Other Topics Concern  . None   Social History Narrative   Additional History:  She currently lives with her husband who is very helpful and has been taking care of her during her surgery. She reported that he is a good  nurse  Assessment:   Musculoskeletal: Strength & Muscle Tone: within normal limits Gait & Station: normal Patient leans: N/A  Psychiatric Specialty Exam: HPI   Review of Systems  Constitutional: Negative for chills.  HENT: Negative for congestion and tinnitus.   Eyes: Negative for double vision.  Respiratory: Negative for hemoptysis.    Cardiovascular: Negative for palpitations.  Gastrointestinal: Positive for diarrhea. Negative for nausea.  Genitourinary: Negative for urgency.  Musculoskeletal: Positive for joint pain. Negative for back pain.  Skin: Negative for rash.  Neurological: Negative for sensory change.  Endo/Heme/Allergies: Negative for environmental allergies.  Psychiatric/Behavioral: Positive for depression. Negative for suicidal ideas and substance abuse. The patient does not have insomnia.     Blood pressure 122/86, pulse 69, temperature 97.1 F (36.2 C), temperature source Tympanic, height 5\' 6"  (1.676 m), weight 231 lb 9.6 oz (105.053 kg), SpO2 96 %.Body mass index is 37.4 kg/(m^2).  General Appearance: Casual  Eye Contact:  Fair  Speech:  Normal Rate  Volume:  Normal  Mood:  Euthymic  Affect:  Appropriate  Thought Process:  Coherent  Orientation:  Full (Time, Place, and Person)  Thought Content:  WDL  Suicidal Thoughts:  No  Homicidal Thoughts:  No  Memory:  Immediate;   Fair  Judgement:  Fair  Insight:  Fair  Psychomotor Activity:  Normal  Concentration:  Fair  Recall:  [employer] of Knowledge: Fair  Language: Fair  Akathisia:  No  Handed:  Right  AIMS (if indicated):  none  Assets:  Communication Skills Desire for Improvement Social Support  ADL's:  Intact  Cognition: WNL  Sleep:  8-10   Is the patient at risk to self?  No. Has the patient been a risk to self in the past 6 months?  No. Has the patient been a risk to self within the distant past?  No. Is the patient a risk to others?  No. Has the patient been a risk to others in the past 6 months?  No. Has the patient been a risk to others within the distant past?  No.  Current Medications: Current Outpatient Prescriptions  Medication Sig Dispense Refill  . acetaminophen (TYLENOL) 500 MG tablet Take 1,000 mg by mouth at bedtime.    Marland Kitchen anastrozole (ARIMIDEX) 1 MG tablet Take 1 mg by mouth at bedtime.    . clonazePAM (KLONOPIN)  0.5 MG tablet Take 1 tablet (0.5 mg total) by mouth at bedtime. 30 tablet 2  . Cyanocobalamin ([employer] B-12) 1000 MCG/15ML LIQD Take by mouth.    . cyclobenzaprine (FLEXERIL) 10 MG tablet Take 10 mg by mouth 2 (two) times daily as needed for muscle spasms.    . diphenhydrAMINE (BENADRYL) 25 MG tablet Take 25 mg by mouth at bedtime.    Marland Kitchen esomeprazole (NEXIUM) 40 MG capsule Take 80 mg by mouth.    Marland Kitchen FLUoxetine (PROZAC) 40 MG capsule Take 1 capsule (40 mg total) by mouth daily. 90 capsule 1  . furosemide (LASIX) 40 MG tablet Take 40 mg by mouth  daily.    . gabapentin (NEURONTIN) 300 MG capsule Take 1,200 mg by mouth 2 (two) times daily. 1200 mg AM, 1500 mg PM    . ketorolac (ACULAR) 0.4 % SOLN BEGINNING 2 DAYS BEFORE SURGERY, INSTILL 1 DROP 4 TIMES A DAY INTO AFFECTED EYE THEN 1 DROP AM OF.  0  . levothyroxine (SYNTHROID, LEVOTHROID) 25 MCG tablet Take 25 mcg by mouth daily.     . meloxicam (MOBIC) 15 MG tablet Take 15 mg by mouth daily.    . metoprolol succinate (TOPROL-XL) 50 MG 24 hr tablet Take 50 mg by mouth every morning.     . montelukast (SINGULAIR) 10 MG tablet Take 10 mg by mouth at bedtime.    Marland Kitchen oxybutynin (DITROPAN) 5 MG tablet Take 5 mg by mouth 3 (three) times daily as needed for bladder spasms.    . pantoprazole (PROTONIX) 40 MG tablet Take 40 mg by mouth daily. AM    . pramipexole (MIRAPEX) 0.25 MG tablet Take 0.25-0.5 mg by mouth 3 (three) times daily. Pt takes two tablets in the morning, one tablet at noon, and two tablets at bedtime.    . sertraline (ZOLOFT) 100 MG tablet Take 200 mg by mouth.    Marland Kitchen VIGAMOX 0.5 % ophthalmic solution BEGINNING 2 DAYS PRIOR SURGERY, 1 DROP 4 TIMES A DAY INTO AFFECTED EYE AND 1 DROP AM OF SURGERY  0  . zolpidem (AMBIEN) 5 MG tablet Take 1 tablet (5 mg total) by mouth at bedtime. 90 tablet 1   No current facility-administered medications for this visit.    Medical Decision Making:  Established Problem, Stable/Improving (1) and Review of Last Therapy  Session (1)  Treatment Plan Summary:Medication management  Depression Continue on Prozac 40 mg in the morning- 90 day supply given  Anxiety Advised patient to start taking Klonopin 0.25 mg at bedtime and she agreed with the plan  Sleeping problems  Patient will continue on zolpidem 5 mg at bedtime and she was given 90 day supply of the medication  Follow-up She will follow-up in 3 months or earlier      More than 50% of the time spent in psychoeducation, counseling and coordination of care.  Time spent with the patient 25 minutes   This note was generated in part or whole with voice recognition software. Voice regonition is usually quite accurate but there are transcription errors that can and very often do occur. I apologize for any typographical errors that were not detected and corrected.    Rainey Pines 01/12/2015, 8:50 AM

## 2015-01-19 ENCOUNTER — Ambulatory Visit: Payer: Self-pay | Admitting: Psychiatry

## 2015-02-10 ENCOUNTER — Other Ambulatory Visit: Payer: Self-pay | Admitting: Psychiatry

## 2015-02-11 ENCOUNTER — Telehealth: Payer: Self-pay

## 2015-02-11 NOTE — Telephone Encounter (Signed)
rx for klonopin.5mg  faxed and confirmed . id # [id number] order # [id number]

## 2015-02-11 NOTE — Telephone Encounter (Signed)
Pt started on Klonopin and takes Zolpidem.

## 2015-02-17 DIAGNOSIS — K219 Gastro-esophageal reflux disease without esophagitis: Secondary | ICD-10-CM | POA: Diagnosis not present

## 2015-02-17 DIAGNOSIS — J432 Centrilobular emphysema: Secondary | ICD-10-CM | POA: Diagnosis not present

## 2015-02-17 DIAGNOSIS — D510 Vitamin B12 deficiency anemia due to intrinsic factor deficiency: Secondary | ICD-10-CM | POA: Diagnosis not present

## 2015-02-17 DIAGNOSIS — Z79899 Other long term (current) drug therapy: Secondary | ICD-10-CM | POA: Diagnosis not present

## 2015-02-17 DIAGNOSIS — I480 Paroxysmal atrial fibrillation: Secondary | ICD-10-CM | POA: Diagnosis not present

## 2015-02-17 DIAGNOSIS — E782 Mixed hyperlipidemia: Secondary | ICD-10-CM | POA: Diagnosis not present

## 2015-02-17 DIAGNOSIS — I10 Essential (primary) hypertension: Secondary | ICD-10-CM | POA: Diagnosis not present

## 2015-02-23 ENCOUNTER — Ambulatory Visit
Admission: RE | Admit: 2015-02-23 | Discharge: 2015-02-23 | Disposition: A | Discharge status: Home / Self Care | Payer: PPO | Source: Ambulatory Visit | Attending: Internal Medicine | Admitting: Internal Medicine

## 2015-02-23 ENCOUNTER — Other Ambulatory Visit: Payer: Self-pay | Admitting: Internal Medicine

## 2015-02-23 ENCOUNTER — Other Ambulatory Visit: Payer: Self-pay | Admitting: Cardiology

## 2015-02-23 DIAGNOSIS — J432 Centrilobular emphysema: Secondary | ICD-10-CM | POA: Diagnosis not present

## 2015-02-23 DIAGNOSIS — R0789 Other chest pain: Secondary | ICD-10-CM | POA: Diagnosis not present

## 2015-02-23 DIAGNOSIS — E782 Mixed hyperlipidemia: Secondary | ICD-10-CM | POA: Diagnosis not present

## 2015-02-23 DIAGNOSIS — R51 Headache: Secondary | ICD-10-CM | POA: Diagnosis not present

## 2015-02-23 DIAGNOSIS — Z0000 Encounter for general adult medical examination without abnormal findings: Secondary | ICD-10-CM | POA: Diagnosis not present

## 2015-02-23 DIAGNOSIS — G8929 Other chronic pain: Secondary | ICD-10-CM

## 2015-02-23 DIAGNOSIS — I200 Unstable angina: Secondary | ICD-10-CM | POA: Insufficient documentation

## 2015-02-23 DIAGNOSIS — I209 Angina pectoris, unspecified: Secondary | ICD-10-CM | POA: Diagnosis not present

## 2015-02-23 DIAGNOSIS — I10 Essential (primary) hypertension: Secondary | ICD-10-CM | POA: Diagnosis not present

## 2015-02-23 DIAGNOSIS — I480 Paroxysmal atrial fibrillation: Secondary | ICD-10-CM | POA: Diagnosis not present

## 2015-03-02 ENCOUNTER — Encounter
Admission: RE | Disposition: A | Discharge status: Home / Self Care | Payer: Self-pay | Source: Ambulatory Visit | Attending: Cardiology

## 2015-03-02 ENCOUNTER — Encounter: Payer: Self-pay | Admitting: *Deleted

## 2015-03-02 ENCOUNTER — Ambulatory Visit
Admission: RE | Admit: 2015-03-02 | Discharge: 2015-03-02 | Disposition: A | Discharge status: Home / Self Care | Payer: PPO | Source: Ambulatory Visit | Attending: Cardiology | Admitting: Cardiology

## 2015-03-02 DIAGNOSIS — Z79899 Other long term (current) drug therapy: Secondary | ICD-10-CM | POA: Insufficient documentation

## 2015-03-02 DIAGNOSIS — I25110 Atherosclerotic heart disease of native coronary artery with unstable angina pectoris: Secondary | ICD-10-CM | POA: Insufficient documentation

## 2015-03-02 DIAGNOSIS — E782 Mixed hyperlipidemia: Secondary | ICD-10-CM | POA: Insufficient documentation

## 2015-03-02 DIAGNOSIS — J449 Chronic obstructive pulmonary disease, unspecified: Secondary | ICD-10-CM | POA: Diagnosis not present

## 2015-03-02 DIAGNOSIS — R51 Headache: Secondary | ICD-10-CM | POA: Diagnosis not present

## 2015-03-02 DIAGNOSIS — Z853 Personal history of malignant neoplasm of breast: Secondary | ICD-10-CM | POA: Diagnosis not present

## 2015-03-02 DIAGNOSIS — Z87891 Personal history of nicotine dependence: Secondary | ICD-10-CM | POA: Insufficient documentation

## 2015-03-02 DIAGNOSIS — Z96653 Presence of artificial knee joint, bilateral: Secondary | ICD-10-CM | POA: Diagnosis not present

## 2015-03-02 DIAGNOSIS — R0789 Other chest pain: Secondary | ICD-10-CM | POA: Diagnosis not present

## 2015-03-02 DIAGNOSIS — I480 Paroxysmal atrial fibrillation: Secondary | ICD-10-CM | POA: Insufficient documentation

## 2015-03-02 DIAGNOSIS — E039 Hypothyroidism, unspecified: Secondary | ICD-10-CM | POA: Insufficient documentation

## 2015-03-02 DIAGNOSIS — F329 Major depressive disorder, single episode, unspecified: Secondary | ICD-10-CM | POA: Diagnosis not present

## 2015-03-02 DIAGNOSIS — I10 Essential (primary) hypertension: Secondary | ICD-10-CM | POA: Diagnosis not present

## 2015-03-02 DIAGNOSIS — Z806 Family history of leukemia: Secondary | ICD-10-CM | POA: Diagnosis not present

## 2015-03-02 DIAGNOSIS — G2581 Restless legs syndrome: Secondary | ICD-10-CM | POA: Insufficient documentation

## 2015-03-02 DIAGNOSIS — E538 Deficiency of other specified B group vitamins: Secondary | ICD-10-CM | POA: Diagnosis not present

## 2015-03-02 DIAGNOSIS — Z8249 Family history of ischemic heart disease and other diseases of the circulatory system: Secondary | ICD-10-CM | POA: Diagnosis not present

## 2015-03-02 DIAGNOSIS — K210 Gastro-esophageal reflux disease with esophagitis: Secondary | ICD-10-CM | POA: Insufficient documentation

## 2015-03-02 DIAGNOSIS — Z808 Family history of malignant neoplasm of other organs or systems: Secondary | ICD-10-CM | POA: Diagnosis not present

## 2015-03-02 DIAGNOSIS — I200 Unstable angina: Secondary | ICD-10-CM | POA: Diagnosis not present

## 2015-03-02 HISTORY — PX: CARDIAC CATHETERIZATION: SHX172

## 2015-03-02 SURGERY — LEFT HEART CATH AND CORONARY ANGIOGRAPHY
Anesthesia: Moderate Sedation

## 2015-03-02 MED ORDER — FENTANYL CITRATE (PF) 100 MCG/2ML IJ SOLN
INTRAMUSCULAR | Status: AC
  Filled 2015-03-02: qty 2

## 2015-03-02 MED ORDER — FENTANYL CITRATE (PF) 100 MCG/2ML IJ SOLN
INTRAMUSCULAR | Status: DC | PRN
Start: 2015-03-02 — End: 2015-03-02
  Administered 2015-03-02: 50 ug via INTRAVENOUS

## 2015-03-02 MED ORDER — SODIUM CHLORIDE 0.9% FLUSH
3.0000 mL | INTRAVENOUS | Status: DC | PRN
Start: 2015-03-02 — End: 2015-03-02

## 2015-03-02 MED ORDER — IOHEXOL 300 MG/ML  SOLN
INTRAMUSCULAR | Status: DC | PRN
  Administered 2015-03-02: 100 mL via INTRA_ARTERIAL

## 2015-03-02 MED ORDER — SODIUM CHLORIDE 0.9 % IV SOLN
INTRAVENOUS | Status: DC
  Administered 2015-03-02: 07:00:00 via INTRAVENOUS

## 2015-03-02 MED ORDER — SODIUM CHLORIDE 0.9% FLUSH: 3.0000 mL | Freq: Two times a day (BID) | INTRAVENOUS | Status: DC

## 2015-03-02 MED ORDER — SODIUM CHLORIDE 0.9 % WEIGHT BASED INFUSION: 1.0000 mL/kg/h | INTRAVENOUS | Status: DC

## 2015-03-02 MED ORDER — SODIUM CHLORIDE 0.9 % IV SOLN: 250.0000 mL | INTRAVENOUS | Status: DC | PRN

## 2015-03-02 MED ORDER — SODIUM CHLORIDE 0.9 % WEIGHT BASED INFUSION: 3.0000 mL/kg/h | INTRAVENOUS | Status: DC

## 2015-03-02 MED ORDER — ASPIRIN 81 MG PO CHEW: 81.0000 mg | CHEWABLE_TABLET | ORAL | Status: DC

## 2015-03-02 MED ORDER — HEPARIN (PORCINE) IN NACL 2-0.9 UNIT/ML-% IJ SOLN
INTRAMUSCULAR | Status: AC
  Filled 2015-03-02: qty 1000

## 2015-03-02 MED ORDER — MIDAZOLAM HCL 2 MG/2ML IJ SOLN
INTRAMUSCULAR | Status: AC
  Filled 2015-03-02: qty 2

## 2015-03-02 MED ORDER — MIDAZOLAM HCL 2 MG/2ML IJ SOLN
INTRAMUSCULAR | Status: DC | PRN
  Administered 2015-03-02: 1 mg via INTRAVENOUS

## 2015-03-02 SURGICAL SUPPLY — 9 items
CATH INFINITI 5FR ANG PIGTAIL (CATHETERS) ×3 IMPLANT
CATH INFINITI 5FR JL4 (CATHETERS) ×3 IMPLANT
CATH INFINITI JR4 5F (CATHETERS) ×3 IMPLANT
DEVICE CLOSURE MYNXGRIP 5F (Vascular Products) ×3 IMPLANT
KIT MANI 3VAL PERCEP (MISCELLANEOUS) ×3 IMPLANT
NEEDLE PERC 18GX7CM (NEEDLE) ×3 IMPLANT
PACK CARDIAC CATH (CUSTOM PROCEDURE TRAY) ×3 IMPLANT
SHEATH PINNACLE 5F 10CM (SHEATH) ×3 IMPLANT
WIRE EMERALD 3MM-J .035X150CM (WIRE) ×3 IMPLANT

## 2015-03-02 NOTE — Discharge Instructions (Signed)

## 2015-03-02 NOTE — H&P (Signed)
Chief Complaint: Chief Complaint  Patient presents with  . Follow-up  Dr Sabra Heck called for the pt to be seen today  . Shortness of Breath  has been been having since Friday  . Atrial Fibrillation  I do have some  . Chest Pain  has been having since friday its like a nag sometimes a little worse than other chest pressure  . Headache  having a brain scan today  . Gastroesophageal Reflux  I have some but its worse I do take omeprazole  Date of Service: 02/23/2015 Date of Birth: 07-15-1950 PCP: Rusty Aus, MD  History of Present Illness: Tonya Walton is a 65 y.o.female patient who returns for an acute visit. Was seen by her primary care provider after complaining of midsternal chest pain radiating to her arm and back with shortness of breath fatigue. This occurs with ambulation with minimal activity. She also has a headache and is being scheduled for a noncontrasted head CT today. EKG reveals sinus rhythm with a rate of 65 bpm. PR interval 136 ms, QRS duration of 80 ms with a QTC of 440 ms with a QRS axis of 46. There is no ischemia. Patient symptoms are progressing. She is unable to ambulate more than a few yards without chest pain shortness of breath. She is currently being treated with metoprolol succinate at 50 mg daily. She complains of exertional nausea or shortness of breath and chest pain.  Past Medical and Surgical History  Past Medical History Past Medical History  Diagnosis Date  . Anxiety, unspecified  . Atrial fibrillation, unspecified ([place])  . Breast cancer ([place])  . COPD (chronic obstructive pulmonary disease) ([place])  . Depression  . GERD (gastroesophageal reflux disease)  . Hyperlipidemia  . Hypertension  . Hypothyroidism, unspecified  . RLS (restless legs syndrome)  . Vitamin B12 deficiency   Past Surgical History She has a past surgical history that includes Colonoscopy (12/31/2012); Appendectomy; Tonsillectomy; WISDOM TEETH; BILATERAL BREAST SURGERY; Right total  knee arthroplasty using computer-assisted navigation (Right, 06/10/2014); Joint replacement (Right); Joint replacement (Left, 09/02/14); and Left knee arthroplasty using computer assisted navigation (Left, 8.3.16).   Medications and Allergies  Current Medications  Current Outpatient Prescriptions  Medication Sig Dispense Refill  . acetaminophen (TYLENOL) 500 MG tablet Take 1,000 mg by mouth nightly. Reported on 02/23/2015  . anastrozole (ARIMIDEX) 1 mg tablet Take 1 mg by mouth once daily.   . clonazePAM (KLONOPIN) 1 MG tablet Take 1 mg by mouth 2 (two) times daily.   . cyanocobalamin, vitamin B-12, 1,000 mcg/15 mL Liqd Take 1,000 mcg by mouth 3 (three) times a week.   . cyclobenzaprine (FLEXERIL) 10 MG tablet Take 10 mg by mouth once daily.   Marland Kitchen FLUoxetine (PROZAC) 40 MG capsule Take 40 mg by mouth once daily.   . FUROsemide (LASIX) 40 MG tablet TAKE 1 TABLET BY MOUTH EVERY MORNING 90 tablet 2  . gabapentin (NEURONTIN) 300 MG capsule Take 300 mg by mouth as directed. 5 qam, 5 qhs.  . levothyroxine (SYNTHROID, LEVOTHROID) 25 MCG tablet Take 25 mcg by mouth once daily.   . meloxicam (MOBIC) 15 MG tablet Take by mouth.  . metoprolol succinate (TOPROL-XL) 50 MG XL tablet Take 50 mg by mouth once daily.   . montelukast (SINGULAIR) 10 mg tablet Take 10 mg by mouth once daily.   Marland Kitchen oxybutynin (DITROPAN) 5 mg tablet Take 5 mg by mouth 3 (three) times daily.   . pramipexole (MIRAPEX) 0.25 MG tablet TAKE 5 TABS BY  MOUTH AS DIRECTED TAKE 2 EVERY AM AND 1 AT NOON AND 2 AT BEDTIME 450 tablet 3  . zolpidem (AMBIEN) 5 MG tablet Take 5 mg by mouth nightly.   . isosorbide mononitrate (IMDUR) 30 MG ER tablet Take 1 tablet (30 mg total) by mouth once daily. 30 tablet 11   No current facility-administered medications for this visit.   Allergies: Review of patient's allergies indicates no known allergies.  Social and Family History  Social History reports that she quit smoking about 23 years ago.  Her smoking use included Cigarettes. She has never used smokeless tobacco. She reports that she does not drink alcohol or use illicit drugs.  Family History Family History  Problem Relation Age of Onset  . Leukemia Mother  . Heart attack Father 82  . Cancer Father  BONE  . Heart attack Brother 69  . Heart attack Brother 50   Review of Systems  Review of Systems  Constitutional: Negative for chills, diaphoresis, fever, malaise/fatigue and weight loss.  HENT: Negative for congestion, ear discharge, hearing loss and tinnitus.  Eyes: Negative for blurred vision.  Respiratory: Positive for shortness of breath. Negative for cough, hemoptysis, sputum production and wheezing.  Cardiovascular: Positive for chest pain. Negative for palpitations, orthopnea, claudication, leg swelling and PND.  Gastrointestinal: Positive for nausea and vomiting. Negative for blood in stool, constipation, diarrhea, heartburn and melena.  Genitourinary: Negative for dysuria, frequency, hematuria and urgency.  Musculoskeletal: Negative for back pain, falls, joint pain and myalgias.  Skin: Negative for itching and rash.  Neurological: Positive for headaches. Negative for dizziness, tingling, focal weakness, loss of consciousness and weakness.  Endo/Heme/Allergies: Negative for polydipsia. Does not bruise/bleed easily.  Psychiatric/Behavioral: Negative for depression, memory loss and substance abuse. The patient is not nervous/anxious.   Physical Examination   Vitals: Visit Vitals  . BP 144/74 (BP Location: Left upper arm, Patient Position: Sitting, BP Cuff Size: Adult)  . Pulse 68  . Resp 12  . Ht 167.6 cm (5\' 6" )  . Wt (!) 105.2 kg (232 lb)  . LMP (LMP Unknown)  . BMI 37.45 kg/m2   Ht:167.6 cm (5\' 6" ) Wt:(!) 105.2 kg (232 lb) [id number] surface area is 2.21 meters squared. Body mass index is 37.45 kg/(m^2).  Wt Readings from Last 3 Encounters:  02/23/15 (!) 105.2 kg (232 lb)  02/23/15 (!) 105.2 kg (232 lb)   02/17/15 (!) 104.6 kg (230 lb 9.6 oz)   BP Readings from Last 3 Encounters:  02/23/15 144/74  02/23/15 110/70  02/17/15 132/70   General appearance appears in no acute distress  Head Mouth and Eye exam Normocephalic, without obvious abnormality, atraumatic Dentition is good Eyes appear anicteric   Neck exam Thyroid: normal  Nodes: no obvious adenopathy  LUNGS Breath Sounds: Normal Percussion: Normal  CARDIOVASCULAR JVP CV wave: no HJR: no Elevation at 90 degrees: None Carotid Pulse: normal pulsation bilaterally Bruit: None Apex: apical impulse normal  Auscultation Rhythm: normal sinus rhythm S1: normal S2: normal Clicks: no Rub: no Murmurs: no murmurs  Gallop: None ABDOMEN Liver enlargement: no Pulsatile aorta: no Ascites: no Bruits: no  EXTREMITIES Clubbing: no Edema: trace to 1+ bilateral pedal edema Pulses: peripheral pulses symmetrical Femoral Bruits: no Amputation: no SKIN Rash: no Cyanosis: no Embolic phemonenon: no Bruising: no NEURO Alert and Oriented to person, place and time: yes Non focal: yes  PSYCH: Pt appears to have normal affect  LABS REVIEWED Last 3 CBC results: Lab Results  Component Value Date  WBC  8.4 02/17/2015  WBC 7.6 02/16/2014  WBC 8.4 06/06/2013   Lab Results  Component Value Date  HGB 11.5 (L) 02/17/2015  HGB 12.6 02/16/2014  HGB 12.4 (L) 06/06/2013   Lab Results  Component Value Date  HCT 37.0 02/17/2015  HCT 40.0 02/16/2014  HCT 38.7 06/06/2013   Lab Results  Component Value Date  PLT 345 02/17/2015  PLT 337 02/16/2014  PLT 330 06/06/2013   Lab Results  Component Value Date  CREATININE 0.7 02/17/2015  BUN 17 02/17/2015  NA 146 (H) 02/17/2015  K 4.2 02/17/2015  CL 106 02/17/2015  CO2 27.1 02/17/2015   Lab Results  Component Value Date  HDL 48.8 02/17/2015  HDL 44.9 02/16/2014   Lab Results  Component Value Date  LDLCALC 112 02/17/2015  LDLCALC 113 02/16/2014   Lab Results   Component Value Date  TRIG 103 02/17/2015  TRIG 233 (H) 02/16/2014   Lab Results  Component Value Date  ALT 14 02/17/2015  AST 19 02/17/2015  ALKPHOS 84 02/17/2015   Lab Results  Component Value Date  TSH 1.518 02/17/2015   Diagnostic Studies Reviewed:  EKG EKG demonstrated normal sinus rhythm, nonspecific ST and T waves changes.  Assessment and Plan   65 y.o. female with  ICD-10-CM ICD-9-CM  1. Mixed hyperlipidemia-we will continue with current regimen and follow. Continue with low-fat diet E78.2 272.2  2. Paroxysmal a-fib (HCC)-currently in sinus rhythm. Continue with metoprolol and follow. Not currently anticoagulated. Does not appear to require this at present I48.0 427.31  3. Benign essential hypertension-blood pressures currently well controlled. Continue with current regimen and DASH diet I10 401.1  4. Centrilobular emphysema (HCC)-no wheezing actively today. J43.2 492.8  5. Gastroesophageal reflux disease, esophagitis presence not specified K21.9 530.81   6. Chest pain. Exertional chest pain consistent with unstable angina, [place] class IV unstable angina. Patient has no EKG changes but has chest pain with any exertion with nausea headache and shortness of breath. Concerned over significant coronary disease. Risk and benefits of left heart cath were explained to the patient she agrees to proceed. Plan to proceed with a left cardiac cath for unstable angina class IV angina. Further recommendations after this is completed. Patient advised to come the emergency room should she has episode of chest pain prior to catheterization being completed  Return in about 4 weeks (around 03/23/2015).  These notes generated with voice recognition software. I apologize for typographical errors.  Sydnee Levans, MD

## 2015-03-10 DIAGNOSIS — I200 Unstable angina: Secondary | ICD-10-CM | POA: Diagnosis not present

## 2015-03-10 DIAGNOSIS — I480 Paroxysmal atrial fibrillation: Secondary | ICD-10-CM | POA: Diagnosis not present

## 2015-03-10 DIAGNOSIS — I10 Essential (primary) hypertension: Secondary | ICD-10-CM | POA: Diagnosis not present

## 2015-03-10 DIAGNOSIS — E782 Mixed hyperlipidemia: Secondary | ICD-10-CM | POA: Diagnosis not present

## 2015-03-10 DIAGNOSIS — I2510 Atherosclerotic heart disease of native coronary artery without angina pectoris: Secondary | ICD-10-CM | POA: Insufficient documentation

## 2015-03-10 DIAGNOSIS — I25118 Atherosclerotic heart disease of native coronary artery with other forms of angina pectoris: Secondary | ICD-10-CM | POA: Diagnosis not present

## 2015-03-12 DIAGNOSIS — I25118 Atherosclerotic heart disease of native coronary artery with other forms of angina pectoris: Secondary | ICD-10-CM | POA: Diagnosis not present

## 2015-03-16 ENCOUNTER — Ambulatory Visit (INDEPENDENT_AMBULATORY_CARE_PROVIDER_SITE_OTHER): Payer: PPO | Admitting: Psychiatry

## 2015-03-16 ENCOUNTER — Encounter: Payer: Self-pay | Admitting: Psychiatry

## 2015-03-16 VITALS — BP 122/76 | HR 97 | Temp 97.9°F | Ht 66.0 in | Wt 234.0 lb

## 2015-03-16 DIAGNOSIS — F330 Major depressive disorder, recurrent, mild: Secondary | ICD-10-CM

## 2015-03-16 MED ORDER — CLONAZEPAM 0.5 MG PO TABS: 1.0000 mg | ORAL_TABLET | Freq: Every day | ORAL | Status: DC

## 2015-03-16 MED ORDER — FLUOXETINE HCL 40 MG PO CAPS: 40.0000 mg | ORAL_CAPSULE | Freq: Every day | ORAL | Status: DC

## 2015-03-16 MED ORDER — ZOLPIDEM TARTRATE 5 MG PO TABS: 5.0000 mg | ORAL_TABLET | Freq: Every day | ORAL | Status: DC

## 2015-03-16 NOTE — Progress Notes (Signed)
BH MD/PA/NP OP Progress Note  03/16/2015 8:55 AM Tonya Walton  MRN:  [id number]  Subjective:  Pt is a 65 year old married female who presented for the follow-up. She she reported that her husband recently had a cardiac infarction and was taking to do hospital. He is recuperating well. She reported that she is also waiting for cardiac stent placed. She went to do and he found a block age. She reported that she feels chest tightness whenever she walks for long distance. Patient reported that she has been compliant with her medication and is trying to decrease the dose of Klonopin. She is sleeping well with the help of combination medications including Klonopin and Ambien. She is not having any adverse effects from the medications. She appeared calm and pleasant during the interview.  Patient currently denied having any suicidal homicidal ideations or plans.   Chief Complaint:  Chief Complaint    Follow-up; Medication Refill; Depression     Visit Diagnosis:     ICD-9-CM ICD-10-CM   1. MDD (major depressive disorder), recurrent episode, mild (HCC) 296.31 F33.0     Past Medical History:  Past Medical History  Diagnosis Date  . Anxiety   . Depression   . Dysrhythmia     Afib  . CHF (congestive heart failure) ([place])   . Hypertension   . COPD (chronic obstructive pulmonary disease) ([place])   . Hypothyroidism   . GERD (gastroesophageal reflux disease)   . Headache   . Neuromuscular disorder ([place])     nerve damage in legs  . Arthritis   . Cancer [facility])     right breast  . Atrial fibrillation, controlled ([place])   . Shortness of breath dyspnea   . Restless leg syndrome     Past Surgical History  Procedure Laterality Date  . Breast lumpectomy      Right breast  . Appendectomy    . Tonsillectomy    . Total knee arthroplasty Right 06/10/2014    Procedure: TOTAL KNEE ARTHROPLASTY;  Surgeon: Dereck Leep, MD;  Location: [facility] ORS;  Service: Orthopedics;  Laterality: Right;  . Joint  replacement Right     Total Knee Replacement  . Total knee arthroplasty Left 09/02/2014    Procedure: TOTAL KNEE ARTHROPLASTY;  Surgeon: Dereck Leep, MD;  Location: [facility] ORS;  Service: Orthopedics;  Laterality: Left;  . Cataract extraction w/phaco Right 10/14/2014    Procedure: CATARACT EXTRACTION PHACO AND INTRAOCULAR LENS PLACEMENT (IOC);  Surgeon: Leandrew Koyanagi, MD;  Location: [place];  Service: Ophthalmology;  Laterality: Right;  . Breast excisional biopsy Right 2012    +  . Breast biopsy Left 07/2013    -  . Cardiac catheterization N/A 03/02/2015    Procedure: Left Heart Cath and Coronary Angiography;  Surgeon: Teodoro Spray, MD;  Location: [place] CV LAB;  Service: Cardiovascular;  Laterality: N/A;   Family History:  Family History  Problem Relation Age of Onset  . Leukemia Mother   . Bone cancer Father   . Heart disease Brother   . Heart disease Brother   . Diabetes Brother   . Hypertension Brother   . Diabetes Brother   . Hypertension Brother   . Heart disease Brother    Social History:  Social History   Social History  . Marital Status: Married    Spouse Name: N/A  . Number of Children: N/A  . Years of Education: N/A   Social History Main Topics  . Smoking status: Former  Smoker -- 1.00 packs/day    Types: Cigarettes    Quit date: 04/30/1992  . Smokeless tobacco: Never Used  . Alcohol Use: No  . Drug Use: No  . Sexual Activity: No   Other Topics Concern  . None   Social History Narrative   Additional History:  She currently lives with her husband who is very helpful and has been taking care of her during her surgery. She reported that he is a good  nurse  Assessment:   Musculoskeletal: Strength & Muscle Tone: within normal limits Gait & Station: normal Patient leans: N/A  Psychiatric Specialty Exam: Depression        Associated symptoms include does not have insomnia and no suicidal ideas.   Review of Systems   Constitutional: Negative for chills.  HENT: Negative for congestion and tinnitus.   Eyes: Negative for double vision.  Respiratory: Negative for hemoptysis.   Cardiovascular: Positive for chest pain. Negative for palpitations.  Gastrointestinal: Positive for diarrhea. Negative for nausea.  Genitourinary: Negative for urgency.  Musculoskeletal: Positive for joint pain. Negative for back pain.  Skin: Negative for rash.  Neurological: Negative for sensory change.  Endo/Heme/Allergies: Negative for environmental allergies.  Psychiatric/Behavioral: Positive for depression. Negative for suicidal ideas and substance abuse. The patient does not have insomnia.     Blood pressure 122/76, pulse 97, temperature 97.9 F (36.6 C), temperature source Tympanic, height _0  (1.676 m), weight 234 lb (106.142 kg), SpO2 96 %.Body mass index is 37.79 kg/(m^2).  General Appearance: Casual  Eye Contact:  Fair  Speech:  Normal Rate  Volume:  Normal  Mood:  Euthymic  Affect:  Appropriate  Thought Process:  Coherent  Orientation:  Full (Time, Place, and Person)  Thought Content:  WDL  Suicidal Thoughts:  No  Homicidal Thoughts:  No  Memory:  Immediate;   Fair  Judgement:  Fair  Insight:  Fair  Psychomotor Activity:  Normal  Concentration:  Fair  Recall:  [employer] of Knowledge: Fair  Language: Fair  Akathisia:  No  Handed:  Right  AIMS (if indicated):  none  Assets:  Communication Skills Desire for Improvement Social Support  ADL's:  Intact  Cognition: WNL  Sleep:  8-10   Is the patient at risk to self?  No. Has the patient been a risk to self in the past 6 months?  No. Has the patient been a risk to self within the distant past?  No. Is the patient a risk to others?  No. Has the patient been a risk to others in the past 6 months?  No. Has the patient been a risk to others within the distant past?  No.  Current Medications: Current Outpatient Prescriptions  Medication Sig Dispense  Refill  . anastrozole (ARIMIDEX) 1 MG tablet Take 1 mg by mouth daily.     . clonazePAM (KLONOPIN) 1 MG tablet Take 1 mg by mouth 2 (two) times daily.    . Cyanocobalamin (B-12 COMPLIANCE INJECTION) 1000 MCG/ML KIT Inject 1,000 mcg as directed 3 (three) times a week.    . Cyanocobalamin (CVS B-12) 1000 MCG/15ML LIQD Take by mouth.    . cyclobenzaprine (FLEXERIL) 10 MG tablet Take 10 mg by mouth daily.     Marland Kitchen esomeprazole (NEXIUM) 40 MG capsule Take 80 mg by mouth.    Marland Kitchen FLUoxetine (PROZAC) 40 MG capsule Take 1 capsule (40 mg total) by mouth daily. 90 capsule 1  . furosemide (LASIX) 40 MG tablet Take 40 mg  by mouth daily as needed.     . gabapentin (NEURONTIN) 300 MG capsule Take 1,200 mg by mouth 2 (two) times daily. 1500 mg AM, 1500 mg PM    . isosorbide mononitrate (IMDUR) 30 MG 24 hr tablet Take 30 mg by mouth daily.    Marland Kitchen ketorolac (ACULAR) 0.4 % SOLN BEGINNING 2 DAYS BEFORE SURGERY, INSTILL 1 DROP 4 TIMES A DAY INTO AFFECTED EYE THEN 1 DROP AM OF.  0  . levothyroxine (SYNTHROID, LEVOTHROID) 25 MCG tablet Take 25 mcg by mouth daily.     . meloxicam (MOBIC) 15 MG tablet Take 15 mg by mouth daily.    . metoprolol succinate (TOPROL-XL) 50 MG 24 hr tablet Take 50 mg by mouth every morning.     . montelukast (SINGULAIR) 10 MG tablet Take 10 mg by mouth at bedtime.    Marland Kitchen oxybutynin (DITROPAN) 5 MG tablet Take 5 mg by mouth 3 (three) times daily as needed for bladder spasms.    . pantoprazole (PROTONIX) 40 MG tablet Take 40 mg by mouth daily. AM    . pramipexole (MIRAPEX) 0.25 MG tablet Take 0.25-0.5 mg by mouth 3 (three) times daily. Pt takes two tablets in the morning, one tablet at noon, and two tablets at bedtime.    Marland Kitchen zolpidem (AMBIEN) 5 MG tablet Take 1 tablet (5 mg total) by mouth at bedtime. 90 tablet 1   No current facility-administered medications for this visit.    Medical Decision Making:  Established Problem, Stable/Improving (1) and Review of Last Therapy Session (1)  Treatment Plan  Summary:Medication management  Depression Continue on Prozac 40 mg in the morning- 90 day supply given  Anxiety Advised patient to start taking Klonopin 0.5 mg at bed. Also discussed with her about changing her medications at that time and she will try to stop the Ambien.  Sleeping problems  Patient will continue on zolpidem 5 mg at bedtime and she was given 90 day supply of the medication  Follow-up She will follow-up in 3 months or earlier      More than 50% of the time spent in psychoeducation, counseling and coordination of care.  Time spent with the patient 25 minutes   This note was generated in part or whole with voice recognition software. Voice regonition is usually quite accurate but there are transcription errors that can and very often do occur. I apologize for any typographical errors that were not detected and corrected.    Rainey Pines, MD  03/16/2015, 8:55 AM

## 2015-03-25 ENCOUNTER — Institutional Professional Consult (permissible substitution) (INDEPENDENT_AMBULATORY_CARE_PROVIDER_SITE_OTHER): Payer: PPO | Admitting: Cardiothoracic Surgery

## 2015-03-25 ENCOUNTER — Encounter: Payer: Self-pay | Admitting: Cardiothoracic Surgery

## 2015-03-25 ENCOUNTER — Other Ambulatory Visit: Payer: Self-pay | Admitting: *Deleted

## 2015-03-25 VITALS — BP 150/75 | HR 75 | Resp 16 | Ht 66.0 in | Wt 232.0 lb

## 2015-03-25 DIAGNOSIS — I25110 Atherosclerotic heart disease of native coronary artery with unstable angina pectoris: Secondary | ICD-10-CM | POA: Diagnosis not present

## 2015-03-25 DIAGNOSIS — I480 Paroxysmal atrial fibrillation: Secondary | ICD-10-CM | POA: Diagnosis not present

## 2015-03-25 DIAGNOSIS — I2510 Atherosclerotic heart disease of native coronary artery without angina pectoris: Secondary | ICD-10-CM

## 2015-03-25 NOTE — Progress Notes (Signed)
PCP is Rusty Aus., MD Referring Provider is Teodoro Spray, MD  Chief Complaint  Patient presents with  . Coronary Artery Disease    eval for CABG x 1 .Marland KitchenMarland KitchenCATH 03/02/15  patient examined, coronary angiogram was personally reviewed  HPI: The patient is a 65 year old  obeseCaucasian female reformed smoker nondiabetic with exertional chest pain and shortness of breath starting earlier this year. She was evaluated by Dr. Ubaldo Glassing and cardiac catheterization was recommended. This was performed on January 31 at [facility] via right femoral artery access. LVEF was normal. LVEDP was 12. Right coronary was small nondominant. The LAD had an ostial 90% stenosis. The circumflex had no significant disease. The patient was not felt to be candidate for PCI and CT surgical evaluation has been requested. Since the cardiac catheterization the patient's angina has remained fairly stable. She denies any resting symptoms or nocturnal angina. She has not taken nitroglycerin.  Patient's cardiac history is positive for intermittent atrial fibrillation. She is currently in sinus rhythm. She has been on no anticoagulation except for a brief period several years ago.the patient brother has had heart bypass surgery and 2 other brothers have had myocardial infarction. Of note the patient's husband had an MI on the same week the patient had her heart cath and underwent urgent cardiac catheterization with PCI at [facility] and now is doing well.  The patient denies diabetes, DVT, or allergies to medications.  The patient had a adenocarcinoma of the right breast in 2012 treated with lumpectomy and radiation therapy which included radiation to the mediastinum. There is a tattoo mark over the sternum.  Past Medical History  Diagnosis Date  . Anxiety   . Depression   . Dysrhythmia     Afib  . CHF (congestive heart failure) ([place])   . Hypertension   . COPD (chronic obstructive pulmonary disease) ([place])   .  Hypothyroidism   . GERD (gastroesophageal reflux disease)   . Headache   . Neuromuscular disorder ([place])     nerve damage in legs  . Arthritis   . Cancer [facility])     right breast  . Atrial fibrillation, controlled ([place])   . Shortness of breath dyspnea   . Restless leg syndrome     Past Surgical History  Procedure Laterality Date  . Breast lumpectomy      Right breast  . Appendectomy    . Tonsillectomy    . Total knee arthroplasty Right 06/10/2014    Procedure: TOTAL KNEE ARTHROPLASTY;  Surgeon: Dereck Leep, MD;  Location: [facility] ORS;  Service: Orthopedics;  Laterality: Right;  . Joint replacement Right     Total Knee Replacement  . Total knee arthroplasty Left 09/02/2014    Procedure: TOTAL KNEE ARTHROPLASTY;  Surgeon: Dereck Leep, MD;  Location: [facility] ORS;  Service: Orthopedics;  Laterality: Left;  . Cataract extraction w/phaco Right 10/14/2014    Procedure: CATARACT EXTRACTION PHACO AND INTRAOCULAR LENS PLACEMENT (IOC);  Surgeon: Leandrew Koyanagi, MD;  Location: [place];  Service: Ophthalmology;  Laterality: Right;  . Breast excisional biopsy Right 2012    +  . Breast biopsy Left 07/2013    -  . Cardiac catheterization N/A 03/02/2015    Procedure: Left Heart Cath and Coronary Angiography;  Surgeon: Teodoro Spray, MD;  Location: [place] CV LAB;  Service: Cardiovascular;  Laterality: N/A;    Family History  Problem Relation Age of Onset  . Leukemia Mother   . Bone cancer Father   .  Heart disease Brother   . Heart disease Brother   . Diabetes Brother   . Hypertension Brother   . Diabetes Brother   . Hypertension Brother   . Heart disease Brother     Social History Social History  Substance Use Topics  . Smoking status: Former Smoker -- 1.00 packs/day    Types: Cigarettes    Quit date: 04/30/1992  . Smokeless tobacco: Never Used  . Alcohol Use: No    Current Outpatient Prescriptions  Medication Sig Dispense Refill  . anastrozole (ARIMIDEX) 1  MG tablet Take 1 mg by mouth daily.     . clonazePAM (KLONOPIN) 0.5 MG tablet Take 2 tablets (1 mg total) by mouth at bedtime. 30 tablet 1  . Cyanocobalamin (B-12 COMPLIANCE INJECTION) 1000 MCG/ML KIT Inject 1,000 mcg as directed 3 (three) times a week.    . Cyanocobalamin (CVS B-12) 1000 MCG/15ML LIQD Take by mouth.    . cyclobenzaprine (FLEXERIL) 10 MG tablet Take 10 mg by mouth daily.     . furosemide (LASIX) 40 MG tablet Take 40 mg by mouth daily as needed.     . gabapentin (NEURONTIN) 300 MG capsule Take 1,200 mg by mouth 2 (two) times daily. 1500 mg AM, 1500 mg PM    . isosorbide mononitrate (IMDUR) 30 MG 24 hr tablet Take 30 mg by mouth daily.    Marland Kitchen levothyroxine (SYNTHROID, LEVOTHROID) 25 MCG tablet Take 25 mcg by mouth daily.     . meloxicam (MOBIC) 15 MG tablet Take 15 mg by mouth daily.    . metoprolol succinate (TOPROL-XL) 50 MG 24 hr tablet Take 50 mg by mouth every morning.     . montelukast (SINGULAIR) 10 MG tablet Take 10 mg by mouth at bedtime.    Marland Kitchen oxybutynin (DITROPAN) 5 MG tablet Take 5 mg by mouth 3 (three) times daily as needed for bladder spasms.    . pantoprazole (PROTONIX) 40 MG tablet Take 40 mg by mouth daily. AM    . pramipexole (MIRAPEX) 0.25 MG tablet Take 0.25-0.5 mg by mouth 3 (three) times daily. Pt takes two tablets in the morning, one tablet at noon, and two tablets at bedtime.    Marland Kitchen zolpidem (AMBIEN) 5 MG tablet Take 1 tablet (5 mg total) by mouth at bedtime. 90 tablet 1  . FLUoxetine (PROZAC) 40 MG capsule Take 1 capsule (40 mg total) by mouth daily. 90 capsule 1   No current facility-administered medications for this visit.    No Known Allergies  Review of Systems        Review of Systems :  [ y ] = yes, [  ] = no        General :  Weight gain [   ]    Weight loss  [   ]  Fatigue [  ]  Fever [  ]  Chills  [  ]                                Weakness  [  ]           Cardiac :  Chest pain/ pressure Totoro.Blacker  ]  Resting SOB [  ] exertional SOB Totoro.Blacker  ]                         [place].Latina [  ]  Pedal edema  [  ]  Palpitations [  ] Syncope/presyncope [ ]                         Paroxysmal nocturnal dyspnea [  ]        Pulmonary : cough [  ]  wheezing [  ]  Hemoptysis [  ] Sputum [  ] Snoring [  ]                              Pneumothorax [  ]  Sleep apnea [  ]                               The patient was treated last month by her primary physician for sinusitis with a course of 10 days of antibiotics. Her symptoms have resolved. Head CT scan at that time was negative.         GI : Vomiting [  ]  Dysphagia [  ]  Melena  [  ]  Abdominal pain [  ] BRBPR [  ]              Heart burn [  ]  Constipation [  ] Diarrhea  [  ] Colonoscopy [  ]       GU : Hematuria [  ]  Dysuria [  ]  Nocturia [  ] UTI's [  ]       Vascular : Claudication [  ]  Rest pain [  ]  DVT [  ] Vein stripping [  ] leg ulcers [  ]                          TIA [  ] Stroke [  ]  Varicose veins [  ]       NEURO :  Headaches  [yes-migraine headache history  ] Seizures [  ] Vision changes [  ] Paresthesias [  ]                     She has peripheral neuropathy of her feet. She is right-hand dominant       Musculoskeletal :  Arthritis [  ] Gout  [  ]  Back pain [  ]  Joint pain [yes-status post bilateral total knee replacement 9 be laying well  ]       Skin :  Rash [  ]  Melanoma [  ]        Heme : Bleeding problems [  ]Clotting Disorders [  ] Anemia [  ]Blood Transfusion [ ]        Endocrine : Diabetes [  ] Thyroid Disorder  [  ]       Psych : Depression Totoro.Blacker  ]  Anxiety [  yes]  Psych hospitalizations [  ]the patient is seen by a psychiatrist and takes Klonopin. She takes Ambien nightly to help her sleep.         BP 150/75 mmHg  Pulse 75  Resp 16  Ht 5' 6"  (1.676 m)  Wt 232 lb (105.235 kg)  BMI 37.46 kg/m2  SpO2 98% Physical Exam      Physical Exam  General: pleasant morbidly obese Caucasian female accompanied by her husband in no acute distress HEENT: Normocephalic pupils equal ,  dentition  adequate Neck: Supple without JVD, adenopathy, or bruit Chest: scattered bibasilar wheezes but otherwise clear no rhonchi, no tenderness             or deformity Cardiovascular: Regular rate and rhythm, no murmur, no gallop, peripheral pulses             palpable in all extremities Abdomen:  Soft, nontender, no palpable mass or organomegaly Extremities: Warm, well-perfused, no clubbing cyanosis or tenderness, 2+ chronic edema of her ankles              no venous stasis changes of the legs Rectal/GU: Deferred Neuro: Grossly non--focal and symmetrical throughout Skin: Clean and dry without rash or ulceration   Diagnostic Tests: I personally reviewed her most recent chest x-ray in the coronary angiograms. I counseled her and her husband with the anatomy of her coronary disease and the need to bypass the LAD with a left IMA graft.  Impression: Severe single vessel coronary disease, class III symptoms with preserved LV function  Plan: Patient will be scheduled for CABG on March 2 at [place]. She'll be preadmitted on figure 28 and evaluated by anesthesia that time. I've reviewed the indications benefits risks and alternatives of CABG with the patient and her husband she understands and agrees to proceed with surgery.  Len Childs, MD [facility] [phone]

## 2015-03-30 ENCOUNTER — Ambulatory Visit (HOSPITAL_COMMUNITY)
Admission: RE | Admit: 2015-03-30 | Discharge: 2015-03-30 | Disposition: A | Discharge status: Home / Self Care | Payer: PPO | Source: Ambulatory Visit | Attending: Cardiothoracic Surgery | Admitting: Cardiothoracic Surgery

## 2015-03-30 ENCOUNTER — Encounter (HOSPITAL_COMMUNITY): Payer: Self-pay

## 2015-03-30 ENCOUNTER — Encounter (HOSPITAL_COMMUNITY)
Admission: RE | Admit: 2015-03-30 | Discharge: 2015-03-30 | Disposition: A | Discharge status: Home / Self Care | Payer: PPO | Source: Ambulatory Visit | Attending: Cardiothoracic Surgery | Admitting: Cardiothoracic Surgery

## 2015-03-30 ENCOUNTER — Ambulatory Visit (HOSPITAL_BASED_OUTPATIENT_CLINIC_OR_DEPARTMENT_OTHER)
Admission: RE | Admit: 2015-03-30 | Discharge: 2015-03-30 | Disposition: A | Discharge status: Home / Self Care | Payer: PPO | Source: Ambulatory Visit | Attending: Cardiothoracic Surgery | Admitting: Cardiothoracic Surgery

## 2015-03-30 VITALS — BP 139/59 | HR 72 | Temp 98.1°F | Resp 18 | Ht 66.0 in | Wt 231.0 lb

## 2015-03-30 DIAGNOSIS — J90 Pleural effusion, not elsewhere classified: Secondary | ICD-10-CM | POA: Diagnosis not present

## 2015-03-30 DIAGNOSIS — Z4682 Encounter for fitting and adjustment of non-vascular catheter: Secondary | ICD-10-CM | POA: Diagnosis not present

## 2015-03-30 DIAGNOSIS — Z87891 Personal history of nicotine dependence: Secondary | ICD-10-CM | POA: Diagnosis not present

## 2015-03-30 DIAGNOSIS — I2510 Atherosclerotic heart disease of native coronary artery without angina pectoris: Secondary | ICD-10-CM

## 2015-03-30 DIAGNOSIS — I110 Hypertensive heart disease with heart failure: Secondary | ICD-10-CM | POA: Diagnosis not present

## 2015-03-30 DIAGNOSIS — Z8249 Family history of ischemic heart disease and other diseases of the circulatory system: Secondary | ICD-10-CM | POA: Diagnosis not present

## 2015-03-30 DIAGNOSIS — Z9841 Cataract extraction status, right eye: Secondary | ICD-10-CM | POA: Diagnosis not present

## 2015-03-30 DIAGNOSIS — Z853 Personal history of malignant neoplasm of breast: Secondary | ICD-10-CM | POA: Diagnosis not present

## 2015-03-30 DIAGNOSIS — J9811 Atelectasis: Secondary | ICD-10-CM | POA: Diagnosis not present

## 2015-03-30 DIAGNOSIS — Z01818 Encounter for other preprocedural examination: Secondary | ICD-10-CM | POA: Diagnosis not present

## 2015-03-30 DIAGNOSIS — F329 Major depressive disorder, single episode, unspecified: Secondary | ICD-10-CM | POA: Diagnosis not present

## 2015-03-30 DIAGNOSIS — D62 Acute posthemorrhagic anemia: Secondary | ICD-10-CM | POA: Diagnosis not present

## 2015-03-30 DIAGNOSIS — Z96653 Presence of artificial knee joint, bilateral: Secondary | ICD-10-CM | POA: Diagnosis not present

## 2015-03-30 DIAGNOSIS — Z961 Presence of intraocular lens: Secondary | ICD-10-CM | POA: Diagnosis not present

## 2015-03-30 DIAGNOSIS — I25110 Atherosclerotic heart disease of native coronary artery with unstable angina pectoris: Secondary | ICD-10-CM | POA: Diagnosis not present

## 2015-03-30 DIAGNOSIS — F419 Anxiety disorder, unspecified: Secondary | ICD-10-CM | POA: Diagnosis not present

## 2015-03-30 DIAGNOSIS — I25119 Atherosclerotic heart disease of native coronary artery with unspecified angina pectoris: Secondary | ICD-10-CM | POA: Diagnosis not present

## 2015-03-30 DIAGNOSIS — R918 Other nonspecific abnormal finding of lung field: Secondary | ICD-10-CM | POA: Diagnosis not present

## 2015-03-30 DIAGNOSIS — Z79899 Other long term (current) drug therapy: Secondary | ICD-10-CM | POA: Diagnosis not present

## 2015-03-30 DIAGNOSIS — I4891 Unspecified atrial fibrillation: Secondary | ICD-10-CM | POA: Diagnosis not present

## 2015-03-30 DIAGNOSIS — J449 Chronic obstructive pulmonary disease, unspecified: Secondary | ICD-10-CM | POA: Diagnosis not present

## 2015-03-30 DIAGNOSIS — I509 Heart failure, unspecified: Secondary | ICD-10-CM | POA: Diagnosis not present

## 2015-03-30 DIAGNOSIS — K219 Gastro-esophageal reflux disease without esophagitis: Secondary | ICD-10-CM | POA: Diagnosis not present

## 2015-03-30 DIAGNOSIS — Z6836 Body mass index (BMI) 36.0-36.9, adult: Secondary | ICD-10-CM | POA: Diagnosis not present

## 2015-03-30 DIAGNOSIS — E039 Hypothyroidism, unspecified: Secondary | ICD-10-CM | POA: Diagnosis not present

## 2015-03-30 DIAGNOSIS — G2581 Restless legs syndrome: Secondary | ICD-10-CM | POA: Diagnosis not present

## 2015-03-30 HISTORY — DX: Atherosclerotic heart disease of native coronary artery without angina pectoris: I25.10

## 2015-03-30 LAB — BLOOD GAS, ARTERIAL
Acid-Base Excess: 0 mmol/L (ref 0.0–2.0)
Bicarbonate: 24.3 mEq/L — ABNORMAL HIGH (ref 20.0–24.0)
Drawn by: 421801
FIO2: 0.21
O2 Saturation: 93.6 %
Patient temperature: 98.6
TCO2: 25.5 mmol/L (ref 0–100)
pCO2 arterial: 40.2 mmHg (ref 35.0–45.0)
pH, Arterial: 7.398 (ref 7.350–7.450)
pO2, Arterial: 70.6 mmHg — ABNORMAL LOW (ref 80.0–100.0)

## 2015-03-30 LAB — PULMONARY FUNCTION TEST
DL/VA % pred: 92 %
DL/VA: 4.69 ml/min/mmHg/L
DLCO unc % pred: 77 %
DLCO unc: 21 ml/min/mmHg
FEF 25-75 Post: 3.57 L/sec
FEF 25-75 Pre: 2.13 L/sec
FEF2575-%Change-Post: 67 %
FEF2575-%Pred-Post: 161 %
FEF2575-%Pred-Pre: 96 %
FEV1-%Change-Post: 14 %
FEV1-%Pred-Post: 83 %
FEV1-%Pred-Pre: 72 %
FEV1-Post: 2.15 L
FEV1-Pre: 1.87 L
FEV1FVC-%Change-Post: -4 %
FEV1FVC-%Pred-Pre: 107 %
FEV6-%Change-Post: 19 %
FEV6-%Pred-Post: 82 %
FEV6-%Pred-Pre: 69 %
FEV6-Post: 2.69 L
FEV6-Pre: 2.26 L
FEV6FVC-%Change-Post: 0 %
FEV6FVC-%Pred-Post: 103 %
FEV6FVC-%Pred-Pre: 104 %
FVC-%Change-Post: 20 %
FVC-%Pred-Post: 80 %
FVC-%Pred-Pre: 66 %
FVC-Post: 2.72 L
FVC-Pre: 2.26 L
Post FEV1/FVC ratio: 79 %
Post FEV6/FVC ratio: 99 %
Pre FEV1/FVC ratio: 83 %
Pre FEV6/FVC Ratio: 100 %
RV % pred: 121 %
RV: 2.69 L
TLC % pred: 96 %
TLC: 5.18 L

## 2015-03-30 LAB — URINALYSIS, ROUTINE W REFLEX MICROSCOPIC
Bilirubin Urine: NEGATIVE
Glucose, UA: NEGATIVE mg/dL
Hgb urine dipstick: NEGATIVE
Ketones, ur: NEGATIVE mg/dL
Leukocytes, UA: NEGATIVE
Nitrite: NEGATIVE
Protein, ur: NEGATIVE mg/dL
Specific Gravity, Urine: 1.024 (ref 1.005–1.030)
pH: 5.5 (ref 5.0–8.0)

## 2015-03-30 LAB — COMPREHENSIVE METABOLIC PANEL
ALT: 21 U/L (ref 14–54)
AST: 29 U/L (ref 15–41)
Albumin: 3.7 g/dL (ref 3.5–5.0)
Alkaline Phosphatase: 94 U/L (ref 38–126)
Anion gap: 15 (ref 5–15)
BUN: 16 mg/dL (ref 6–20)
CO2: 21 mmol/L — ABNORMAL LOW (ref 22–32)
Calcium: 9.3 mg/dL (ref 8.9–10.3)
Chloride: 107 mmol/L (ref 101–111)
Creatinine, Ser: 0.8 mg/dL (ref 0.44–1.00)
GFR calc Af Amer: >60 mL/min (ref >60)
GFR calc non Af Amer: >60 mL/min (ref >60)
Glucose, Bld: 90 mg/dL (ref 65–99)
Potassium: 4.6 mmol/L (ref 3.5–5.1)
Sodium: 143 mmol/L (ref 135–145)
Total Bilirubin: 0.3 mg/dL (ref 0.3–1.2)
Total Protein: 7.6 g/dL (ref 6.5–8.1)

## 2015-03-30 LAB — SURGICAL PCR SCREEN
MRSA, PCR: NEGATIVE
Staphylococcus aureus: NEGATIVE

## 2015-03-30 LAB — GLUCOSE, CAPILLARY: Glucose-Capillary: 66 mg/dL (ref 65–99)

## 2015-03-30 LAB — CBC
HCT: 36.6 % (ref 36.0–46.0)
Hemoglobin: 11.1 g/dL — ABNORMAL LOW (ref 12.0–15.0)
MCH: 26.6 pg (ref 26.0–34.0)
MCHC: 30.3 g/dL (ref 30.0–36.0)
MCV: 87.8 fL (ref 78.0–100.0)
Platelets: 304 10*3/uL (ref 150–400)
RBC: 4.17 MIL/uL (ref 3.87–5.11)
RDW: 15.6 % — ABNORMAL HIGH (ref 11.5–15.5)
WBC: 8.5 10*3/uL (ref 4.0–10.5)

## 2015-03-30 LAB — PROTIME-INR
INR: 1.07 (ref 0.00–1.49)
Prothrombin Time: 14.1 seconds (ref 11.6–15.2)

## 2015-03-30 LAB — APTT: aPTT: 27 seconds (ref 24–37)

## 2015-03-30 LAB — ABO/RH: ABO/RH(D): O POS

## 2015-03-30 MED ORDER — ALBUTEROL SULFATE (2.5 MG/3ML) 0.083% IN NEBU
2.5000 mg | INHALATION_SOLUTION | Freq: Once | RESPIRATORY_TRACT | Status: AC
  Administered 2015-03-30: 2.5 mg via RESPIRATORY_TRACT

## 2015-03-30 NOTE — Progress Notes (Signed)
Cardio is Dr. Ubaldo Glassing @ [facility] PCP is Dr. Emily Filbert also, at [place] done today 03/30/2015

## 2015-03-30 NOTE — Pre-Procedure Instructions (Addendum)
TYLAYSIA HONG  03/30/2015      [id number] - [place], [place] - [address] [place] [address] Phone: [phone] Fax: [phone]    Your procedure is scheduled on Thursday, March 2nd   Report to [facility] Admitting at 5:30 AM             (Posted Surgery time 7:30 am - 1:49 pm)   Call this number if you have problems the morning of surgery:  [phone]   Remember:  Do not eat food or drink liquids after midnight Wednesday.  Take these medicines the morning of surgery with A SIP OF WATER : Prozac, Imdur, Levothyroxine, Protonix            (4-5 days prior to surgery, STOP TAKING herbal medications, vitamins, supplements, anti-inflammatories)   Do not wear jewelry, make-up or nail polish.  Do not wear lotions, powders, or perfumes.  You may NOT wear deodorant the day of surgery.   Do not shave 48 hours prior to surgery.    Do not bring valuables to the hospital.  [facility] is not responsible for any belongings or valuables.  Contacts, dentures or bridgework may not be worn into surgery.  Leave your suitcase in the car.  After surgery it may be brought to your room.  For patients admitted to the hospital, discharge time will be determined by your treatment team.  Name and phone number of your driver:      Please read over the following fact sheets that you were given. Pain Booklet, Coughing and Deep Breathing, Blood Transfusion Information, MRSA Information and Surgical Site Infection Prevention

## 2015-03-30 NOTE — Progress Notes (Signed)
Pre-op Cardiac Surgery  Carotid Findings:  There is no obvious evidence of hemodynamically significant internal carotid artery stenosis bilaterally. Vertebral arteries are patent with antegrade flow.  Upper Extremity Right Left  Brachial Pressures 169-Triphasic 164-Triphasic  Radial Waveforms Triphasic Triphasic  Ulnar Waveforms Triphasic Triphasic  Palmar Arch (Allen's Test) Signal is unaffected with radial compression, decreases >50% with ulnar compression. Signal decreases <50% with radial compression, is unaffected with ulnar compression.    Lower  Extremity Right Left  Dorsalis Pedis 157-Biphasic 146-Monophasic  Anterior Tibial    Posterior Tibial 188-Biphasic 179-Triphasic  Ankle/Brachial Indices 1.11 1.06    Findings:   Bilateral ABIs are within normal limits.  03/30/2015 1:49 PM Maudry Mayhew, RVT, RDCS, RDMS

## 2015-03-31 LAB — HEMOGLOBIN A1C
Hgb A1c MFr Bld: 5.9 % — ABNORMAL HIGH (ref 4.8–5.6)
Mean Plasma Glucose: 123 mg/dL

## 2015-03-31 MED ORDER — PLASMA-LYTE 148 IV SOLN
INTRAVENOUS | Status: AC
  Administered 2015-04-01: 500 mL
  Filled 2015-03-31: qty 2.5

## 2015-03-31 MED ORDER — MAGNESIUM SULFATE 50 % IJ SOLN
40.0000 meq | INTRAMUSCULAR | Status: DC
  Filled 2015-03-31: qty 10

## 2015-03-31 MED ORDER — CEFUROXIME SODIUM 750 MG IJ SOLR
750.0000 mg | INTRAMUSCULAR | Status: DC
  Filled 2015-03-31: qty 750

## 2015-03-31 MED ORDER — DEXTROSE 5 % IV SOLN
1.5000 g | INTRAVENOUS | Status: AC
  Administered 2015-04-01: 1.5 g via INTRAVENOUS
  Administered 2015-04-01: .75 g via INTRAVENOUS
  Filled 2015-03-31 (×2): qty 1.5

## 2015-03-31 MED ORDER — PHENYLEPHRINE HCL 10 MG/ML IJ SOLN
30.0000 ug/min | INTRAVENOUS | Status: AC
  Administered 2015-04-01: 5 ug/min via INTRAVENOUS
  Filled 2015-03-31: qty 2

## 2015-03-31 MED ORDER — METOPROLOL TARTRATE 12.5 MG HALF TABLET: 12.5000 mg | ORAL_TABLET | Freq: Once | ORAL | Status: DC

## 2015-03-31 MED ORDER — EPINEPHRINE HCL 1 MG/ML IJ SOLN
0.0000 ug/min | INTRAVENOUS | Status: DC
  Filled 2015-03-31: qty 4

## 2015-03-31 MED ORDER — DOPAMINE-DEXTROSE 3.2-5 MG/ML-% IV SOLN
0.0000 ug/kg/min | INTRAVENOUS | Status: DC
  Filled 2015-03-31 (×2): qty 250

## 2015-03-31 MED ORDER — SODIUM CHLORIDE 0.9 % IV SOLN
INTRAVENOUS | Status: AC
  Administered 2015-04-01: 1 [IU]/h via INTRAVENOUS
  Filled 2015-03-31: qty 2.5

## 2015-03-31 MED ORDER — CHLORHEXIDINE GLUCONATE 0.12 % MT SOLN
15.0000 mL | Freq: Once | OROMUCOSAL | Status: AC
  Administered 2015-04-01: 15 mL via OROMUCOSAL
  Filled 2015-03-31: qty 15

## 2015-03-31 MED ORDER — SODIUM CHLORIDE 0.9 % IV SOLN
INTRAVENOUS | Status: AC
  Administered 2015-04-01: 70 mL/h via INTRAVENOUS
  Filled 2015-03-31: qty 40

## 2015-03-31 MED ORDER — DEXMEDETOMIDINE HCL IN NACL 400 MCG/100ML IV SOLN
0.1000 ug/kg/h | INTRAVENOUS | Status: AC
  Administered 2015-04-01: .2 ug/kg/h via INTRAVENOUS
  Filled 2015-03-31: qty 100

## 2015-03-31 MED ORDER — HEPARIN SODIUM (PORCINE) 1000 UNIT/ML IJ SOLN
INTRAMUSCULAR | Status: DC
  Filled 2015-03-31: qty 30

## 2015-03-31 MED ORDER — CHLORHEXIDINE GLUCONATE 4 % EX LIQD: 30.0000 mL | CUTANEOUS | Status: DC

## 2015-03-31 MED ORDER — POTASSIUM CHLORIDE 2 MEQ/ML IV SOLN
80.0000 meq | INTRAVENOUS | Status: DC
  Filled 2015-03-31: qty 40

## 2015-03-31 MED ORDER — NITROGLYCERIN IN D5W 200-5 MCG/ML-% IV SOLN
2.0000 ug/min | INTRAVENOUS | Status: AC
  Administered 2015-04-01: 16.6 ug/min via INTRAVENOUS
  Filled 2015-03-31: qty 250

## 2015-03-31 MED ORDER — VANCOMYCIN HCL 10 G IV SOLR
1500.0000 mg | INTRAVENOUS | Status: AC
  Administered 2015-04-01: 1500 mg via INTRAVENOUS
  Filled 2015-03-31: qty 1500

## 2015-04-01 ENCOUNTER — Encounter (HOSPITAL_COMMUNITY)
Admission: RE | Disposition: A | Discharge status: Home / Self Care | Payer: Self-pay | Source: Ambulatory Visit | Attending: Cardiothoracic Surgery

## 2015-04-01 ENCOUNTER — Inpatient Hospital Stay (HOSPITAL_COMMUNITY): Payer: PPO

## 2015-04-01 ENCOUNTER — Inpatient Hospital Stay (HOSPITAL_COMMUNITY)
Admission: RE | Admit: 2015-04-01 | Discharge: 2015-04-06 | DRG: 236 | Disposition: A | Discharge status: Home / Self Care | Payer: PPO | Source: Ambulatory Visit | Attending: Cardiothoracic Surgery | Admitting: Cardiothoracic Surgery

## 2015-04-01 ENCOUNTER — Encounter (HOSPITAL_COMMUNITY): Payer: Self-pay | Admitting: *Deleted

## 2015-04-01 ENCOUNTER — Inpatient Hospital Stay (HOSPITAL_COMMUNITY): Payer: PPO | Admitting: Certified Registered"

## 2015-04-01 DIAGNOSIS — Z951 Presence of aortocoronary bypass graft: Secondary | ICD-10-CM

## 2015-04-01 DIAGNOSIS — Z8249 Family history of ischemic heart disease and other diseases of the circulatory system: Secondary | ICD-10-CM | POA: Diagnosis not present

## 2015-04-01 DIAGNOSIS — Z96653 Presence of artificial knee joint, bilateral: Secondary | ICD-10-CM | POA: Diagnosis present

## 2015-04-01 DIAGNOSIS — I25119 Atherosclerotic heart disease of native coronary artery with unspecified angina pectoris: Principal | ICD-10-CM | POA: Diagnosis present

## 2015-04-01 DIAGNOSIS — K219 Gastro-esophageal reflux disease without esophagitis: Secondary | ICD-10-CM | POA: Diagnosis present

## 2015-04-01 DIAGNOSIS — Z79899 Other long term (current) drug therapy: Secondary | ICD-10-CM

## 2015-04-01 DIAGNOSIS — D62 Acute posthemorrhagic anemia: Secondary | ICD-10-CM | POA: Diagnosis not present

## 2015-04-01 DIAGNOSIS — Z9841 Cataract extraction status, right eye: Secondary | ICD-10-CM | POA: Diagnosis not present

## 2015-04-01 DIAGNOSIS — G2581 Restless legs syndrome: Secondary | ICD-10-CM | POA: Diagnosis not present

## 2015-04-01 DIAGNOSIS — Z853 Personal history of malignant neoplasm of breast: Secondary | ICD-10-CM | POA: Diagnosis not present

## 2015-04-01 DIAGNOSIS — I2510 Atherosclerotic heart disease of native coronary artery without angina pectoris: Secondary | ICD-10-CM

## 2015-04-01 DIAGNOSIS — J449 Chronic obstructive pulmonary disease, unspecified: Secondary | ICD-10-CM | POA: Diagnosis present

## 2015-04-01 DIAGNOSIS — E039 Hypothyroidism, unspecified: Secondary | ICD-10-CM | POA: Diagnosis not present

## 2015-04-01 DIAGNOSIS — I25110 Atherosclerotic heart disease of native coronary artery with unstable angina pectoris: Secondary | ICD-10-CM | POA: Diagnosis not present

## 2015-04-01 DIAGNOSIS — I509 Heart failure, unspecified: Secondary | ICD-10-CM | POA: Diagnosis present

## 2015-04-01 DIAGNOSIS — F419 Anxiety disorder, unspecified: Secondary | ICD-10-CM | POA: Diagnosis not present

## 2015-04-01 DIAGNOSIS — Z6836 Body mass index (BMI) 36.0-36.9, adult: Secondary | ICD-10-CM | POA: Diagnosis not present

## 2015-04-01 DIAGNOSIS — I4891 Unspecified atrial fibrillation: Secondary | ICD-10-CM

## 2015-04-01 DIAGNOSIS — F329 Major depressive disorder, single episode, unspecified: Secondary | ICD-10-CM | POA: Diagnosis not present

## 2015-04-01 DIAGNOSIS — Z961 Presence of intraocular lens: Secondary | ICD-10-CM | POA: Diagnosis present

## 2015-04-01 DIAGNOSIS — I110 Hypertensive heart disease with heart failure: Secondary | ICD-10-CM | POA: Diagnosis present

## 2015-04-01 DIAGNOSIS — Z87891 Personal history of nicotine dependence: Secondary | ICD-10-CM

## 2015-04-01 DIAGNOSIS — R079 Chest pain, unspecified: Secondary | ICD-10-CM | POA: Diagnosis present

## 2015-04-01 DIAGNOSIS — J9811 Atelectasis: Secondary | ICD-10-CM | POA: Diagnosis not present

## 2015-04-01 HISTORY — PX: CORONARY ARTERY BYPASS GRAFT: SHX141

## 2015-04-01 HISTORY — PX: TEE WITHOUT CARDIOVERSION: SHX5443

## 2015-04-01 HISTORY — DX: Presence of aortocoronary bypass graft: Z95.1

## 2015-04-01 LAB — POCT I-STAT, CHEM 8
BUN: 14 mg/dL (ref 6–20)
BUN: 13 mg/dL (ref 6–20)
BUN: 11 mg/dL (ref 6–20)
BUN: 12 mg/dL (ref 6–20)
BUN: 12 mg/dL (ref 6–20)
BUN: 12 mg/dL (ref 6–20)
Calcium, Ion: 1.17 mmol/L (ref 1.13–1.30)
Calcium, Ion: 1.19 mmol/L (ref 1.13–1.30)
Calcium, Ion: 1 mmol/L — ABNORMAL LOW (ref 1.13–1.30)
Calcium, Ion: 1.04 mmol/L — ABNORMAL LOW (ref 1.13–1.30)
Calcium, Ion: 1.08 mmol/L — ABNORMAL LOW (ref 1.13–1.30)
Calcium, Ion: 1.13 mmol/L (ref 1.13–1.30)
Chloride: 108 mmol/L (ref 101–111)
Chloride: 107 mmol/L (ref 101–111)
Chloride: 101 mmol/L (ref 101–111)
Chloride: 104 mmol/L (ref 101–111)
Chloride: 103 mmol/L (ref 101–111)
Chloride: 108 mmol/L (ref 101–111)
Creatinine, Ser: 0.5 mg/dL (ref 0.44–1.00)
Creatinine, Ser: 0.4 mg/dL — ABNORMAL LOW (ref 0.44–1.00)
Creatinine, Ser: 0.4 mg/dL — ABNORMAL LOW (ref 0.44–1.00)
Creatinine, Ser: 0.5 mg/dL (ref 0.44–1.00)
Creatinine, Ser: 0.6 mg/dL (ref 0.44–1.00)
Creatinine, Ser: 0.7 mg/dL (ref 0.44–1.00)
Glucose, Bld: 113 mg/dL — ABNORMAL HIGH (ref 65–99)
Glucose, Bld: 101 mg/dL — ABNORMAL HIGH (ref 65–99)
Glucose, Bld: 101 mg/dL — ABNORMAL HIGH (ref 65–99)
Glucose, Bld: 93 mg/dL (ref 65–99)
Glucose, Bld: 130 mg/dL — ABNORMAL HIGH (ref 65–99)
Glucose, Bld: 133 mg/dL — ABNORMAL HIGH (ref 65–99)
HCT: 29 % — ABNORMAL LOW (ref 36.0–46.0)
HCT: 28 % — ABNORMAL LOW (ref 36.0–46.0)
HCT: 23 % — ABNORMAL LOW (ref 36.0–46.0)
HCT: 23 % — ABNORMAL LOW (ref 36.0–46.0)
HCT: 24 % — ABNORMAL LOW (ref 36.0–46.0)
HCT: 27 % — ABNORMAL LOW (ref 36.0–46.0)
Hemoglobin: 9.9 g/dL — ABNORMAL LOW (ref 12.0–15.0)
Hemoglobin: 9.5 g/dL — ABNORMAL LOW (ref 12.0–15.0)
Hemoglobin: 7.8 g/dL — ABNORMAL LOW (ref 12.0–15.0)
Hemoglobin: 7.8 g/dL — ABNORMAL LOW (ref 12.0–15.0)
Hemoglobin: 8.2 g/dL — ABNORMAL LOW (ref 12.0–15.0)
Hemoglobin: 9.2 g/dL — ABNORMAL LOW (ref 12.0–15.0)
Potassium: 4.3 mmol/L (ref 3.5–5.1)
Potassium: 4.3 mmol/L (ref 3.5–5.1)
Potassium: 4.1 mmol/L (ref 3.5–5.1)
Potassium: 5.2 mmol/L — ABNORMAL HIGH (ref 3.5–5.1)
Potassium: 4.9 mmol/L (ref 3.5–5.1)
Potassium: 4.5 mmol/L (ref 3.5–5.1)
Sodium: 144 mmol/L (ref 135–145)
Sodium: 144 mmol/L (ref 135–145)
Sodium: 140 mmol/L (ref 135–145)
Sodium: 141 mmol/L (ref 135–145)
Sodium: 141 mmol/L (ref 135–145)
Sodium: 141 mmol/L (ref 135–145)
TCO2: 26 mmol/L (ref 0–100)
TCO2: 27 mmol/L (ref 0–100)
TCO2: 26 mmol/L (ref 0–100)
TCO2: 29 mmol/L (ref 0–100)
TCO2: 24 mmol/L (ref 0–100)
TCO2: 24 mmol/L (ref 0–100)

## 2015-04-01 LAB — CBC
HCT: 26.7 % — ABNORMAL LOW (ref 36.0–46.0)
HCT: 28.5 % — ABNORMAL LOW (ref 36.0–46.0)
Hemoglobin: 8.3 g/dL — ABNORMAL LOW (ref 12.0–15.0)
Hemoglobin: 8.5 g/dL — ABNORMAL LOW (ref 12.0–15.0)
MCH: 27.4 pg (ref 26.0–34.0)
MCH: 26.2 pg (ref 26.0–34.0)
MCHC: 31.1 g/dL (ref 30.0–36.0)
MCHC: 29.8 g/dL — ABNORMAL LOW (ref 30.0–36.0)
MCV: 88.1 fL (ref 78.0–100.0)
MCV: 88 fL (ref 78.0–100.0)
Platelets: 158 10*3/uL (ref 150–400)
Platelets: 206 10*3/uL (ref 150–400)
RBC: 3.03 MIL/uL — ABNORMAL LOW (ref 3.87–5.11)
RBC: 3.24 MIL/uL — ABNORMAL LOW (ref 3.87–5.11)
RDW: 15.5 % (ref 11.5–15.5)
RDW: 15.5 % (ref 11.5–15.5)
WBC: 12.3 10*3/uL — ABNORMAL HIGH (ref 4.0–10.5)
WBC: 13.3 10*3/uL — ABNORMAL HIGH (ref 4.0–10.5)

## 2015-04-01 LAB — POCT I-STAT 3, ART BLOOD GAS (G3+)
Acid-Base Excess: 2 mmol/L (ref 0.0–2.0)
Acid-base deficit: 3 mmol/L — ABNORMAL HIGH (ref 0.0–2.0)
Acid-base deficit: 5 mmol/L — ABNORMAL HIGH (ref 0.0–2.0)
Acid-base deficit: 4 mmol/L — ABNORMAL HIGH (ref 0.0–2.0)
Acid-base deficit: 6 mmol/L — ABNORMAL HIGH (ref 0.0–2.0)
Acid-base deficit: 5 mmol/L — ABNORMAL HIGH (ref 0.0–2.0)
Acid-base deficit: 4 mmol/L — ABNORMAL HIGH (ref 0.0–2.0)
Bicarbonate: 27 mEq/L — ABNORMAL HIGH (ref 20.0–24.0)
Bicarbonate: 23.7 mEq/L (ref 20.0–24.0)
Bicarbonate: 22.1 mEq/L (ref 20.0–24.0)
Bicarbonate: 22.3 mEq/L (ref 20.0–24.0)
Bicarbonate: 20.6 mEq/L (ref 20.0–24.0)
Bicarbonate: 21.6 mEq/L (ref 20.0–24.0)
Bicarbonate: 22.9 mEq/L (ref 20.0–24.0)
O2 Saturation: 100 %
O2 Saturation: 99 %
O2 Saturation: 94 %
O2 Saturation: 98 %
O2 Saturation: 88 %
O2 Saturation: 99 %
O2 Saturation: 99 %
Patient temperature: 36.3
Patient temperature: 37.1
Patient temperature: 38.13
Patient temperature: 38.2
Patient temperature: 99
TCO2: 28 mmol/L (ref 0–100)
TCO2: 25 mmol/L (ref 0–100)
TCO2: 24 mmol/L (ref 0–100)
TCO2: 24 mmol/L (ref 0–100)
TCO2: 22 mmol/L (ref 0–100)
TCO2: 23 mmol/L (ref 0–100)
TCO2: 24 mmol/L (ref 0–100)
pCO2 arterial: 42.4 mmHg (ref 35.0–45.0)
pCO2 arterial: 48.6 mmHg — ABNORMAL HIGH (ref 35.0–45.0)
pCO2 arterial: 48.6 mmHg — ABNORMAL HIGH (ref 35.0–45.0)
pCO2 arterial: 46.4 mmHg — ABNORMAL HIGH (ref 35.0–45.0)
pCO2 arterial: 44.8 mmHg (ref 35.0–45.0)
pCO2 arterial: 48.5 mmHg — ABNORMAL HIGH (ref 35.0–45.0)
pCO2 arterial: 48.1 mmHg — ABNORMAL HIGH (ref 35.0–45.0)
pH, Arterial: 7.413 (ref 7.350–7.450)
pH, Arterial: 7.296 — ABNORMAL LOW (ref 7.350–7.450)
pH, Arterial: 7.263 — ABNORMAL LOW (ref 7.350–7.450)
pH, Arterial: 7.29 — ABNORMAL LOW (ref 7.350–7.450)
pH, Arterial: 7.276 — ABNORMAL LOW (ref 7.350–7.450)
pH, Arterial: 7.264 — ABNORMAL LOW (ref 7.350–7.450)
pH, Arterial: 7.286 — ABNORMAL LOW (ref 7.350–7.450)
pO2, Arterial: 298 mmHg — ABNORMAL HIGH (ref 80.0–100.0)
pO2, Arterial: 139 mmHg — ABNORMAL HIGH (ref 80.0–100.0)
pO2, Arterial: 79 mmHg — ABNORMAL LOW (ref 80.0–100.0)
pO2, Arterial: 124 mmHg — ABNORMAL HIGH (ref 80.0–100.0)
pO2, Arterial: 66 mmHg — ABNORMAL LOW (ref 80.0–100.0)
pO2, Arterial: 152 mmHg — ABNORMAL HIGH (ref 80.0–100.0)
pO2, Arterial: 141 mmHg — ABNORMAL HIGH (ref 80.0–100.0)

## 2015-04-01 LAB — POCT I-STAT 4, (NA,K, GLUC, HGB,HCT)
Glucose, Bld: 107 mg/dL — ABNORMAL HIGH (ref 65–99)
Glucose, Bld: 110 mg/dL — ABNORMAL HIGH (ref 65–99)
HCT: 27 % — ABNORMAL LOW (ref 36.0–46.0)
HCT: 28 % — ABNORMAL LOW (ref 36.0–46.0)
Hemoglobin: 9.2 g/dL — ABNORMAL LOW (ref 12.0–15.0)
Hemoglobin: 9.5 g/dL — ABNORMAL LOW (ref 12.0–15.0)
Potassium: 4.8 mmol/L (ref 3.5–5.1)
Potassium: 4.9 mmol/L (ref 3.5–5.1)
Sodium: 142 mmol/L (ref 135–145)
Sodium: 143 mmol/L (ref 135–145)

## 2015-04-01 LAB — GLUCOSE, CAPILLARY
Glucose-Capillary: 117 mg/dL — ABNORMAL HIGH (ref 65–99)
Glucose-Capillary: 109 mg/dL — ABNORMAL HIGH (ref 65–99)
Glucose-Capillary: 110 mg/dL — ABNORMAL HIGH (ref 65–99)
Glucose-Capillary: 122 mg/dL — ABNORMAL HIGH (ref 65–99)
Glucose-Capillary: 125 mg/dL — ABNORMAL HIGH (ref 65–99)

## 2015-04-01 LAB — CARBOXYHEMOGLOBIN
Carboxyhemoglobin: 1.4 % (ref 0.5–1.5)
Carboxyhemoglobin: 1 % (ref 0.5–1.5)
Methemoglobin: 0.8 % (ref 0.0–1.5)
Methemoglobin: 0.9 % (ref 0.0–1.5)
O2 Saturation: 63.5 %
O2 Saturation: 58 %
Total hemoglobin: 8.8 g/dL — ABNORMAL LOW (ref 12.0–16.0)
Total hemoglobin: 9.2 g/dL — ABNORMAL LOW (ref 12.0–16.0)

## 2015-04-01 LAB — CREATININE, SERUM
Creatinine, Ser: 0.89 mg/dL (ref 0.44–1.00)
GFR calc Af Amer: >60 mL/min (ref >60)
GFR calc non Af Amer: >60 mL/min (ref >60)

## 2015-04-01 LAB — APTT: aPTT: 31 seconds (ref 24–37)

## 2015-04-01 LAB — PLATELET COUNT: Platelets: 210 10*3/uL (ref 150–400)

## 2015-04-01 LAB — MAGNESIUM: Magnesium: 2.8 mg/dL — ABNORMAL HIGH (ref 1.7–2.4)

## 2015-04-01 LAB — PROTIME-INR
INR: 1.46 (ref 0.00–1.49)
Prothrombin Time: 17.8 seconds — ABNORMAL HIGH (ref 11.6–15.2)

## 2015-04-01 LAB — HEMOGLOBIN AND HEMATOCRIT, BLOOD
HCT: 22.5 % — ABNORMAL LOW (ref 36.0–46.0)
Hemoglobin: 6.9 g/dL — CL (ref 12.0–15.0)

## 2015-04-01 LAB — PREPARE RBC (CROSSMATCH)

## 2015-04-01 SURGERY — CORONARY ARTERY BYPASS GRAFTING (CABG)
Anesthesia: General | Site: Chest

## 2015-04-01 MED ORDER — SODIUM CHLORIDE 0.9 % IV SOLN: INTRAVENOUS | Status: DC

## 2015-04-01 MED ORDER — ZOLPIDEM TARTRATE 5 MG PO TABS
5.0000 mg | ORAL_TABLET | Freq: Every day | ORAL | Status: DC
  Administered 2015-04-03 – 2015-04-05 (×3): 5 mg via ORAL
  Filled 2015-04-01 (×3): qty 1

## 2015-04-01 MED ORDER — PROPOFOL 10 MG/ML IV BOLUS
INTRAVENOUS | Status: DC | PRN
  Administered 2015-04-01: 150 mg via INTRAVENOUS

## 2015-04-01 MED ORDER — BISACODYL 10 MG RE SUPP: 10.0000 mg | Freq: Every day | RECTAL | Status: DC

## 2015-04-01 MED ORDER — MAGNESIUM SULFATE 4 GM/100ML IV SOLN
4.0000 g | Freq: Once | INTRAVENOUS | Status: AC
  Administered 2015-04-01: 4 g via INTRAVENOUS
  Filled 2015-04-01: qty 100

## 2015-04-01 MED ORDER — DEXTROSE 5 % IV SOLN
1.5000 g | Freq: Two times a day (BID) | INTRAVENOUS | Status: AC
  Administered 2015-04-01 – 2015-04-03 (×4): 1.5 g via INTRAVENOUS
  Filled 2015-04-01 (×4): qty 1.5

## 2015-04-01 MED ORDER — FENTANYL CITRATE (PF) 100 MCG/2ML IJ SOLN
INTRAMUSCULAR | Status: DC | PRN
Start: 2015-04-01 — End: 2015-04-01
  Administered 2015-04-01: 150 ug via INTRAVENOUS
  Administered 2015-04-01: 100 ug via INTRAVENOUS
  Administered 2015-04-01: 250 ug via INTRAVENOUS
  Administered 2015-04-01: 150 ug via INTRAVENOUS
  Administered 2015-04-01: 50 ug via INTRAVENOUS
  Administered 2015-04-01: 100 ug via INTRAVENOUS
  Administered 2015-04-01: 450 ug via INTRAVENOUS

## 2015-04-01 MED ORDER — ASPIRIN 81 MG PO CHEW
324.0000 mg | CHEWABLE_TABLET | Freq: Every day | ORAL | Status: DC
  Administered 2015-04-06: 324 mg
  Filled 2015-04-01: qty 4

## 2015-04-01 MED ORDER — EPHEDRINE SULFATE 50 MG/ML IJ SOLN
INTRAMUSCULAR | Status: AC
  Filled 2015-04-01: qty 1

## 2015-04-01 MED ORDER — MIDAZOLAM HCL 2 MG/2ML IJ SOLN: 2.0000 mg | INTRAMUSCULAR | Status: DC | PRN

## 2015-04-01 MED ORDER — METOCLOPRAMIDE HCL 5 MG/ML IJ SOLN
10.0000 mg | Freq: Four times a day (QID) | INTRAMUSCULAR | Status: DC
  Administered 2015-04-01 – 2015-04-03 (×8): 10 mg via INTRAVENOUS
  Filled 2015-04-01 (×8): qty 2

## 2015-04-01 MED ORDER — SODIUM CHLORIDE 0.9 % IV SOLN: 250.0000 mL | INTRAVENOUS | Status: DC

## 2015-04-01 MED ORDER — ANTISEPTIC ORAL RINSE SOLUTION (CORINZ)
7.0000 mL | Freq: Four times a day (QID) | OROMUCOSAL | Status: DC
  Administered 2015-04-01 – 2015-04-03 (×4): 7 mL via OROMUCOSAL

## 2015-04-01 MED ORDER — OXYCODONE HCL 5 MG PO TABS: 5.0000 mg | ORAL_TABLET | ORAL | Status: DC | PRN

## 2015-04-01 MED ORDER — CLONAZEPAM 0.5 MG PO TABS: 0.5000 mg | ORAL_TABLET | Freq: Every day | ORAL | Status: DC

## 2015-04-01 MED ORDER — PRAMIPEXOLE DIHYDROCHLORIDE 0.25 MG PO TABS
0.2500 mg | ORAL_TABLET | Freq: Every day | ORAL | Status: DC
  Administered 2015-04-02 – 2015-04-06 (×5): 0.25 mg via ORAL
  Filled 2015-04-01 (×6): qty 1

## 2015-04-01 MED ORDER — BISACODYL 5 MG PO TBEC
10.0000 mg | DELAYED_RELEASE_TABLET | Freq: Every day | ORAL | Status: DC
  Administered 2015-04-02 – 2015-04-03 (×2): 10 mg via ORAL
  Filled 2015-04-01 (×3): qty 2

## 2015-04-01 MED ORDER — HEPARIN SODIUM (PORCINE) 1000 UNIT/ML IJ SOLN
INTRAMUSCULAR | Status: AC
  Filled 2015-04-01: qty 1

## 2015-04-01 MED ORDER — DOPAMINE-DEXTROSE 3.2-5 MG/ML-% IV SOLN
3.0000 ug/kg/min | INTRAVENOUS | Status: DC
  Administered 2015-04-01 – 2015-04-03 (×2): 3 ug/kg/min via INTRAVENOUS
  Filled 2015-04-01: qty 250

## 2015-04-01 MED ORDER — SODIUM CHLORIDE 0.9% FLUSH: 3.0000 mL | INTRAVENOUS | Status: DC | PRN

## 2015-04-01 MED ORDER — 0.9 % SODIUM CHLORIDE (POUR BTL) OPTIME
TOPICAL | Status: DC | PRN
  Administered 2015-04-01: 6000 mL

## 2015-04-01 MED ORDER — SODIUM CHLORIDE 0.9 % IJ SOLN
INTRAMUSCULAR | Status: AC
  Filled 2015-04-01: qty 10

## 2015-04-01 MED ORDER — OXYBUTYNIN CHLORIDE 5 MG PO TABS
5.0000 mg | ORAL_TABLET | Freq: Three times a day (TID) | ORAL | Status: DC | PRN
  Filled 2015-04-01: qty 1

## 2015-04-01 MED ORDER — LEVALBUTEROL HCL 1.25 MG/0.5ML IN NEBU
1.2500 mg | INHALATION_SOLUTION | Freq: Four times a day (QID) | RESPIRATORY_TRACT | Status: DC
  Administered 2015-04-01 – 2015-04-02 (×4): 1.25 mg via RESPIRATORY_TRACT
  Filled 2015-04-01 (×4): qty 0.5

## 2015-04-01 MED ORDER — VANCOMYCIN HCL IN DEXTROSE 1-5 GM/200ML-% IV SOLN
1000.0000 mg | Freq: Once | INTRAVENOUS | Status: DC
  Filled 2015-04-01: qty 200

## 2015-04-01 MED ORDER — VANCOMYCIN HCL IN DEXTROSE 1-5 GM/200ML-% IV SOLN
1000.0000 mg | Freq: Two times a day (BID) | INTRAVENOUS | Status: AC
  Administered 2015-04-01 – 2015-04-02 (×3): 1000 mg via INTRAVENOUS
  Filled 2015-04-01 (×3): qty 200

## 2015-04-01 MED ORDER — ROCURONIUM BROMIDE 50 MG/5ML IV SOLN
INTRAVENOUS | Status: AC
  Filled 2015-04-01: qty 2

## 2015-04-01 MED ORDER — PROTAMINE SULFATE 10 MG/ML IV SOLN
INTRAVENOUS | Status: DC | PRN
  Administered 2015-04-01: 320 mg via INTRAVENOUS

## 2015-04-01 MED ORDER — TRAMADOL HCL 50 MG PO TABS
50.0000 mg | ORAL_TABLET | ORAL | Status: DC | PRN
  Administered 2015-04-02: 50 mg via ORAL
  Administered 2015-04-02: 100 mg via ORAL
  Administered 2015-04-02: 50 mg via ORAL
  Administered 2015-04-03: 100 mg via ORAL
  Administered 2015-04-04: 50 mg via ORAL
  Administered 2015-04-05 (×2): 100 mg via ORAL
  Filled 2015-04-01 (×2): qty 2
  Filled 2015-04-01 (×3): qty 1
  Filled 2015-04-01: qty 2
  Filled 2015-04-01: qty 1
  Filled 2015-04-01: qty 2

## 2015-04-01 MED ORDER — FENTANYL CITRATE (PF) 100 MCG/2ML IJ SOLN
25.0000 ug | INTRAMUSCULAR | Status: DC | PRN
Start: 2015-04-01 — End: 2015-04-04
  Administered 2015-04-02 (×2): 25 ug via INTRAVENOUS
  Filled 2015-04-01 (×4): qty 2

## 2015-04-01 MED ORDER — STERILE WATER FOR INJECTION IJ SOLN
INTRAMUSCULAR | Status: AC
  Filled 2015-04-01: qty 30

## 2015-04-01 MED ORDER — DEXMEDETOMIDINE HCL IN NACL 200 MCG/50ML IV SOLN
0.0000 ug/kg/h | INTRAVENOUS | Status: DC
  Filled 2015-04-01: qty 50

## 2015-04-01 MED ORDER — ACETAMINOPHEN 650 MG RE SUPP
650.0000 mg | Freq: Once | RECTAL | Status: AC
  Administered 2015-04-01: 650 mg via RECTAL

## 2015-04-01 MED ORDER — ACETAMINOPHEN 160 MG/5ML PO SOLN: 650.0000 mg | Freq: Once | ORAL | Status: AC

## 2015-04-01 MED ORDER — CHLORHEXIDINE GLUCONATE 0.12 % MT SOLN
15.0000 mL | OROMUCOSAL | Status: AC
  Administered 2015-04-01: 15 mL via OROMUCOSAL

## 2015-04-01 MED ORDER — ANASTROZOLE 1 MG PO TABS
1.0000 mg | ORAL_TABLET | Freq: Every day | ORAL | Status: DC
  Administered 2015-04-02 – 2015-04-06 (×5): 1 mg via ORAL
  Filled 2015-04-01 (×9): qty 1

## 2015-04-01 MED ORDER — ACETAMINOPHEN 160 MG/5ML PO SOLN: 1000.0000 mg | Freq: Four times a day (QID) | ORAL | Status: DC

## 2015-04-01 MED ORDER — VECURONIUM BROMIDE 10 MG IV SOLR
INTRAVENOUS | Status: AC
  Filled 2015-04-01: qty 20

## 2015-04-01 MED ORDER — PROPOFOL 10 MG/ML IV BOLUS
INTRAVENOUS | Status: AC
  Filled 2015-04-01: qty 20

## 2015-04-01 MED ORDER — SODIUM BICARBONATE 8.4 % IV SOLN
25.0000 meq | Freq: Once | INTRAVENOUS | Status: AC
  Administered 2015-04-01: 25 meq via INTRAVENOUS

## 2015-04-01 MED ORDER — GABAPENTIN 400 MG PO CAPS
1500.0000 mg | ORAL_CAPSULE | Freq: Two times a day (BID) | ORAL | Status: DC
  Administered 2015-04-02: 1500 mg via ORAL
  Filled 2015-04-01: qty 1

## 2015-04-01 MED ORDER — VECURONIUM BROMIDE 10 MG IV SOLR
INTRAVENOUS | Status: DC | PRN
  Administered 2015-04-01 (×2): 5 mg via INTRAVENOUS

## 2015-04-01 MED ORDER — NITROGLYCERIN IN D5W 200-5 MCG/ML-% IV SOLN: 0.0000 ug/min | INTRAVENOUS | Status: DC

## 2015-04-01 MED ORDER — SODIUM CHLORIDE 0.45 % IV SOLN
INTRAVENOUS | Status: DC | PRN
Start: 2015-04-01 — End: 2015-04-05
  Administered 2015-04-01: 14:00:00 via INTRAVENOUS

## 2015-04-01 MED ORDER — CYANOCOBALAMIN 1000 MCG/ML IJ KIT: 1000.0000 ug | PACK | INTRAMUSCULAR | Status: DC

## 2015-04-01 MED ORDER — INSULIN REGULAR BOLUS VIA INFUSION
0.0000 [IU] | Freq: Three times a day (TID) | INTRAVENOUS | Status: DC
  Filled 2015-04-01: qty 10

## 2015-04-01 MED ORDER — LIDOCAINE HCL (CARDIAC) 20 MG/ML IV SOLN
INTRAVENOUS | Status: AC
  Filled 2015-04-01: qty 5

## 2015-04-01 MED ORDER — SODIUM CHLORIDE 0.9 % IJ SOLN
OROMUCOSAL | Status: DC | PRN
  Administered 2015-04-01 (×3): 4 mL via TOPICAL

## 2015-04-01 MED ORDER — ARTIFICIAL TEARS OP OINT
TOPICAL_OINTMENT | OPHTHALMIC | Status: AC
  Filled 2015-04-01: qty 3.5

## 2015-04-01 MED ORDER — LEVOTHYROXINE SODIUM 25 MCG PO TABS
25.0000 ug | ORAL_TABLET | Freq: Every day | ORAL | Status: DC
  Administered 2015-04-02 – 2015-04-06 (×5): 25 ug via ORAL
  Filled 2015-04-01 (×6): qty 1

## 2015-04-01 MED ORDER — LACTATED RINGERS IV SOLN: 500.0000 mL | Freq: Once | INTRAVENOUS | Status: DC | PRN

## 2015-04-01 MED ORDER — FLUOXETINE HCL 20 MG PO CAPS
40.0000 mg | ORAL_CAPSULE | Freq: Every day | ORAL | Status: DC
  Administered 2015-04-02 – 2015-04-06 (×5): 40 mg via ORAL
  Filled 2015-04-01 (×5): qty 2

## 2015-04-01 MED ORDER — LACTATED RINGERS IV SOLN
INTRAVENOUS | Status: DC | PRN
  Administered 2015-04-01 (×4): via INTRAVENOUS

## 2015-04-01 MED ORDER — PRAMIPEXOLE DIHYDROCHLORIDE 0.25 MG PO TABS
0.5000 mg | ORAL_TABLET | Freq: Two times a day (BID) | ORAL | Status: DC
  Administered 2015-04-02 – 2015-04-06 (×9): 0.5 mg via ORAL
  Filled 2015-04-01 (×12): qty 2

## 2015-04-01 MED ORDER — ROCURONIUM BROMIDE 100 MG/10ML IV SOLN
INTRAVENOUS | Status: DC | PRN
Start: 2015-04-01 — End: 2015-04-01
  Administered 2015-04-01 (×2): 50 mg via INTRAVENOUS

## 2015-04-01 MED ORDER — LACTATED RINGERS IV SOLN: INTRAVENOUS | Status: DC

## 2015-04-01 MED ORDER — MORPHINE SULFATE (PF) 2 MG/ML IV SOLN: 1.0000 mg | INTRAVENOUS | Status: DC | PRN

## 2015-04-01 MED ORDER — METOPROLOL TARTRATE 12.5 MG HALF TABLET
12.5000 mg | ORAL_TABLET | Freq: Two times a day (BID) | ORAL | Status: DC
  Administered 2015-04-02 – 2015-04-06 (×7): 12.5 mg via ORAL
  Filled 2015-04-01 (×8): qty 1

## 2015-04-01 MED ORDER — ACETAMINOPHEN 500 MG PO TABS
1000.0000 mg | ORAL_TABLET | Freq: Four times a day (QID) | ORAL | Status: DC
  Administered 2015-04-02 – 2015-04-06 (×16): 1000 mg via ORAL
  Filled 2015-04-01 (×16): qty 2

## 2015-04-01 MED ORDER — ASPIRIN EC 325 MG PO TBEC
325.0000 mg | DELAYED_RELEASE_TABLET | Freq: Every day | ORAL | Status: DC
  Administered 2015-04-02 – 2015-04-05 (×4): 325 mg via ORAL
  Filled 2015-04-01 (×5): qty 1

## 2015-04-01 MED ORDER — SODIUM CHLORIDE 0.9% FLUSH
3.0000 mL | Freq: Two times a day (BID) | INTRAVENOUS | Status: DC
  Administered 2015-04-02 (×2): 3 mL via INTRAVENOUS
  Administered 2015-04-03: 10 mL via INTRAVENOUS
  Administered 2015-04-03: 3 mL via INTRAVENOUS
  Administered 2015-04-04: 10 mL via INTRAVENOUS
  Administered 2015-04-04 – 2015-04-05 (×3): 3 mL via INTRAVENOUS

## 2015-04-01 MED ORDER — MONTELUKAST SODIUM 10 MG PO TABS
10.0000 mg | ORAL_TABLET | Freq: Every day | ORAL | Status: DC
  Administered 2015-04-02 – 2015-04-05 (×4): 10 mg via ORAL
  Filled 2015-04-01 (×4): qty 1

## 2015-04-01 MED ORDER — MIDAZOLAM HCL 5 MG/5ML IJ SOLN
INTRAMUSCULAR | Status: DC | PRN
  Administered 2015-04-01: 1 mg via INTRAVENOUS
  Administered 2015-04-01: 2 mg via INTRAVENOUS
  Administered 2015-04-01: 4 mg via INTRAVENOUS
  Administered 2015-04-01: 3 mg via INTRAVENOUS
  Administered 2015-04-01 (×2): 1 mg via INTRAVENOUS

## 2015-04-01 MED ORDER — CYCLOBENZAPRINE HCL 10 MG PO TABS
10.0000 mg | ORAL_TABLET | Freq: Every day | ORAL | Status: DC
  Administered 2015-04-02: 10 mg via ORAL
  Filled 2015-04-01 (×2): qty 1

## 2015-04-01 MED ORDER — METOPROLOL TARTRATE 25 MG/10 ML ORAL SUSPENSION: 12.5000 mg | Freq: Two times a day (BID) | ORAL | Status: DC

## 2015-04-01 MED ORDER — MIDAZOLAM HCL 2 MG/2ML IJ SOLN
INTRAMUSCULAR | Status: AC
  Filled 2015-04-01: qty 2

## 2015-04-01 MED ORDER — ONDANSETRON HCL 4 MG/2ML IJ SOLN: 4.0000 mg | Freq: Four times a day (QID) | INTRAMUSCULAR | Status: DC | PRN

## 2015-04-01 MED ORDER — SODIUM CHLORIDE 0.9 % IV SOLN
INTRAVENOUS | Status: DC
  Administered 2015-04-02: 01:00:00 via INTRAVENOUS
  Filled 2015-04-01 (×2): qty 2.5

## 2015-04-01 MED ORDER — DOCUSATE SODIUM 100 MG PO CAPS
200.0000 mg | ORAL_CAPSULE | Freq: Every day | ORAL | Status: DC
  Administered 2015-04-02 – 2015-04-03 (×2): 200 mg via ORAL
  Filled 2015-04-01 (×4): qty 2

## 2015-04-01 MED ORDER — FAMOTIDINE IN NACL 20-0.9 MG/50ML-% IV SOLN
20.0000 mg | Freq: Two times a day (BID) | INTRAVENOUS | Status: AC
  Administered 2015-04-01 – 2015-04-02 (×2): 20 mg via INTRAVENOUS
  Filled 2015-04-01: qty 50

## 2015-04-01 MED ORDER — HEMOSTATIC AGENTS (NO CHARGE) OPTIME
TOPICAL | Status: DC | PRN
  Administered 2015-04-01 (×2): 1 via TOPICAL

## 2015-04-01 MED ORDER — POTASSIUM CHLORIDE 10 MEQ/50ML IV SOLN: 10.0000 meq | INTRAVENOUS | Status: AC

## 2015-04-01 MED ORDER — CHLORHEXIDINE GLUCONATE 0.12% ORAL RINSE (MEDLINE KIT)
15.0000 mL | Freq: Two times a day (BID) | OROMUCOSAL | Status: DC
  Administered 2015-04-01 – 2015-04-03 (×2): 15 mL via OROMUCOSAL

## 2015-04-01 MED ORDER — METOPROLOL TARTRATE 1 MG/ML IV SOLN: 2.5000 mg | INTRAVENOUS | Status: DC | PRN

## 2015-04-01 MED ORDER — FENTANYL CITRATE (PF) 250 MCG/5ML IJ SOLN
INTRAMUSCULAR | Status: AC
  Filled 2015-04-01: qty 25

## 2015-04-01 MED ORDER — PANTOPRAZOLE SODIUM 40 MG PO TBEC
40.0000 mg | DELAYED_RELEASE_TABLET | Freq: Every day | ORAL | Status: DC
  Administered 2015-04-03 – 2015-04-06 (×4): 40 mg via ORAL
  Filled 2015-04-01 (×4): qty 1

## 2015-04-01 MED ORDER — PHENYLEPHRINE HCL 10 MG/ML IJ SOLN
0.0000 ug/min | INTRAVENOUS | Status: DC
  Administered 2015-04-01: 45 ug/min via INTRAVENOUS
  Administered 2015-04-02: 20 ug/min via INTRAVENOUS
  Filled 2015-04-01 (×3): qty 2

## 2015-04-01 MED ORDER — ALBUMIN HUMAN 5 % IV SOLN
250.0000 mL | INTRAVENOUS | Status: AC | PRN
  Administered 2015-04-01 (×3): 250 mL via INTRAVENOUS

## 2015-04-01 MED ORDER — HEPARIN SODIUM (PORCINE) 1000 UNIT/ML IJ SOLN
INTRAMUSCULAR | Status: DC | PRN
Start: 2015-04-01 — End: 2015-04-01
  Administered 2015-04-01: 32000 [IU] via INTRAVENOUS
  Administered 2015-04-01: 3000 [IU] via INTRAVENOUS

## 2015-04-01 MED ORDER — MIDAZOLAM HCL 10 MG/2ML IJ SOLN
INTRAMUSCULAR | Status: AC
  Filled 2015-04-01: qty 2

## 2015-04-01 SURGICAL SUPPLY — 103 items
ADAPTER CARDIO PERF ANTE/RETRO (ADAPTER) ×4 IMPLANT
BAG DECANTER FOR FLEXI CONT (MISCELLANEOUS) ×4 IMPLANT
BANDAGE ELASTIC 4 VELCRO ST LF (GAUZE/BANDAGES/DRESSINGS) IMPLANT
BANDAGE ELASTIC 6 VELCRO ST LF (GAUZE/BANDAGES/DRESSINGS) IMPLANT
BASKET HEART  (ORDER IN 25'S) (MISCELLANEOUS) ×1
BASKET HEART (ORDER IN 25'S) (MISCELLANEOUS) ×1
BASKET HEART (ORDER IN 25S) (MISCELLANEOUS) ×2 IMPLANT
BENZOIN TINCTURE PRP APPL 2/3 (GAUZE/BANDAGES/DRESSINGS) ×4 IMPLANT
BLADE STERNUM SYSTEM 6 (BLADE) ×4 IMPLANT
BLADE SURG 12 STRL SS (BLADE) ×4 IMPLANT
BLADE SURG ROTATE 9660 (MISCELLANEOUS) ×4 IMPLANT
BNDG GAUZE ELAST 4 BULKY (GAUZE/BANDAGES/DRESSINGS) IMPLANT
CANISTER SUCTION 2500CC (MISCELLANEOUS) ×4 IMPLANT
CANNULA GUNDRY RCSP 15FR (MISCELLANEOUS) ×4 IMPLANT
CATH CPB KIT VANTRIGT (MISCELLANEOUS) ×4 IMPLANT
CATH ROBINSON RED A/P 18FR (CATHETERS) ×12 IMPLANT
CATH THORACIC 36FR RT ANG (CATHETERS) ×4 IMPLANT
CLIP RETRACTION 3.0MM CORONARY (MISCELLANEOUS) ×4 IMPLANT
COVER SURGICAL LIGHT HANDLE (MISCELLANEOUS) ×4 IMPLANT
CRADLE DONUT ADULT HEAD (MISCELLANEOUS) ×4 IMPLANT
DRAIN CHANNEL 32F RND 10.7 FF (WOUND CARE) ×4 IMPLANT
DRAPE CARDIOVASCULAR INCISE (DRAPES) ×2
DRAPE SLUSH/WARMER DISC (DRAPES) ×4 IMPLANT
DRAPE SRG 135X102X78XABS (DRAPES) ×2 IMPLANT
DRSG AQUACEL AG ADV 3.5X14 (GAUZE/BANDAGES/DRESSINGS) ×4 IMPLANT
ELECT BLADE 4.0 EZ CLEAN MEGAD (MISCELLANEOUS) ×4
ELECT BLADE 6.5 EXT (BLADE) ×4 IMPLANT
ELECT CAUTERY BLADE 6.4 (BLADE) ×4 IMPLANT
ELECT REM PT RETURN 9FT ADLT (ELECTROSURGICAL) ×8
ELECTRODE BLDE 4.0 EZ CLN MEGD (MISCELLANEOUS) ×2 IMPLANT
ELECTRODE REM PT RTRN 9FT ADLT (ELECTROSURGICAL) ×4 IMPLANT
FELT TEFLON 1X6 (MISCELLANEOUS) ×4 IMPLANT
GAUZE SPONGE 4X4 12PLY STRL (GAUZE/BANDAGES/DRESSINGS) ×8 IMPLANT
GLOVE BIO SURGEON STRL SZ 6.5 (GLOVE) ×12 IMPLANT
GLOVE BIO SURGEON STRL SZ7 (GLOVE) ×8 IMPLANT
GLOVE BIO SURGEON STRL SZ7.5 (GLOVE) ×12 IMPLANT
GLOVE BIO SURGEONS STRL SZ 6.5 (GLOVE) ×4
GLOVE BIOGEL PI IND STRL 6 (GLOVE) ×6 IMPLANT
GLOVE BIOGEL PI IND STRL 7.0 (GLOVE) ×2 IMPLANT
GLOVE BIOGEL PI INDICATOR 6 (GLOVE) ×6
GLOVE BIOGEL PI INDICATOR 7.0 (GLOVE) ×2
GOWN STRL REUS W/ TWL LRG LVL3 (GOWN DISPOSABLE) ×12 IMPLANT
GOWN STRL REUS W/TWL LRG LVL3 (GOWN DISPOSABLE) ×12
HEMOSTAT POWDER SURGIFOAM 1G (HEMOSTASIS) ×12 IMPLANT
HEMOSTAT SURGICEL 2X14 (HEMOSTASIS) ×4 IMPLANT
INSERT FOGARTY XLG (MISCELLANEOUS) IMPLANT
KIT BASIN OR (CUSTOM PROCEDURE TRAY) ×4 IMPLANT
KIT ROOM TURNOVER OR (KITS) ×4 IMPLANT
KIT SUCTION CATH 14FR (SUCTIONS) ×4 IMPLANT
KIT VASOVIEW W/TROCAR VH 2000 (KITS) ×4 IMPLANT
LEAD PACING MYOCARDI (MISCELLANEOUS) ×4 IMPLANT
MARKER GRAFT CORONARY BYPASS (MISCELLANEOUS) ×4 IMPLANT
NS IRRIG 1000ML POUR BTL (IV SOLUTION) ×24 IMPLANT
PACK OPEN HEART (CUSTOM PROCEDURE TRAY) ×4 IMPLANT
PAD ARMBOARD 7.5X6 YLW CONV (MISCELLANEOUS) ×8 IMPLANT
PAD ELECT DEFIB RADIOL ZOLL (MISCELLANEOUS) ×4 IMPLANT
PENCIL BUTTON HOLSTER BLD 10FT (ELECTRODE) ×4 IMPLANT
PUNCH AORTIC ROTATE 4.0MM (MISCELLANEOUS) IMPLANT
PUNCH AORTIC ROTATE 4.5MM 8IN (MISCELLANEOUS) IMPLANT
PUNCH AORTIC ROTATE 5MM 8IN (MISCELLANEOUS) IMPLANT
SET CARDIOPLEGIA MPS 5001102 (MISCELLANEOUS) ×4 IMPLANT
SPONGE GAUZE 4X4 12PLY STER LF (GAUZE/BANDAGES/DRESSINGS) ×4 IMPLANT
SPONGE LAP 18X18 X RAY DECT (DISPOSABLE) ×4 IMPLANT
SPONGE LAP 4X18 X RAY DECT (DISPOSABLE) ×4 IMPLANT
STOPCOCK 4 WAY LG BORE MALE ST (IV SETS) ×4 IMPLANT
SURGIFLO W/THROMBIN 8M KIT (HEMOSTASIS) ×4 IMPLANT
SUT BONE WAX W31G (SUTURE) ×4 IMPLANT
SUT MNCRL AB 4-0 PS2 18 (SUTURE) IMPLANT
SUT PROLENE 3 0 SH DA (SUTURE) ×4 IMPLANT
SUT PROLENE 3 0 SH1 36 (SUTURE) IMPLANT
SUT PROLENE 4 0 RB 1 (SUTURE) ×2
SUT PROLENE 4 0 SH DA (SUTURE) ×4 IMPLANT
SUT PROLENE 4-0 RB1 .5 CRCL 36 (SUTURE) ×2 IMPLANT
SUT PROLENE 5 0 C 1 36 (SUTURE) IMPLANT
SUT PROLENE 6 0 C 1 30 (SUTURE) ×24 IMPLANT
SUT PROLENE 6 0 CC (SUTURE) ×12 IMPLANT
SUT PROLENE 8 0 BV175 6 (SUTURE) ×24 IMPLANT
SUT PROLENE BLUE 7 0 (SUTURE) ×4 IMPLANT
SUT SILK  1 MH (SUTURE)
SUT SILK 1 MH (SUTURE) IMPLANT
SUT SILK 2 0 SH CR/8 (SUTURE) ×4 IMPLANT
SUT SILK 3 0 SH CR/8 (SUTURE) IMPLANT
SUT STEEL 6MS V (SUTURE) ×4 IMPLANT
SUT STEEL SZ 6 DBL 3X14 BALL (SUTURE) ×8 IMPLANT
SUT VIC AB 1 CTX 36 (SUTURE) ×4
SUT VIC AB 1 CTX36XBRD ANBCTR (SUTURE) ×4 IMPLANT
SUT VIC AB 2-0 CT1 27 (SUTURE)
SUT VIC AB 2-0 CT1 TAPERPNT 27 (SUTURE) IMPLANT
SUT VIC AB 2-0 CTX 27 (SUTURE) IMPLANT
SUT VIC AB 3-0 X1 27 (SUTURE) IMPLANT
SUTURE E-PAK OPEN HEART (SUTURE) ×4 IMPLANT
SYRINGE 20CC LL (MISCELLANEOUS) ×4 IMPLANT
SYSTEM SAHARA CHEST DRAIN ATS (WOUND CARE) ×4 IMPLANT
TAPE CLOTH SURG 4X10 WHT LF (GAUZE/BANDAGES/DRESSINGS) ×4 IMPLANT
TAPE PAPER 2X10 WHT MICROPORE (GAUZE/BANDAGES/DRESSINGS) ×4 IMPLANT
TOWEL OR 17X24 6PK STRL BLUE (TOWEL DISPOSABLE) ×12 IMPLANT
TOWEL OR 17X26 10 PK STRL BLUE (TOWEL DISPOSABLE) ×8 IMPLANT
TRAY CATH LUMEN 1 20CM STRL (SET/KITS/TRAYS/PACK) ×4 IMPLANT
TRAY FOLEY IC TEMP SENS 16FR (CATHETERS) ×4 IMPLANT
TUBING ART PRESS 48 MALE/FEM (TUBING) ×8 IMPLANT
TUBING INSUFFLATION (TUBING) ×4 IMPLANT
UNDERPAD 30X30 INCONTINENT (UNDERPADS AND DIAPERS) ×4 IMPLANT
WATER STERILE IRR 1000ML POUR (IV SOLUTION) ×8 IMPLANT

## 2015-04-01 NOTE — Progress Notes (Signed)
Patient arrived back from OR and placed on vent settings per protocol with no complications. Will continue to monitor pt.

## 2015-04-01 NOTE — Procedures (Addendum)
Extubation Procedure Note  Patient Details:   Name: Tonya Walton DOB: 10/14/1950 MRN: [id number]   Airway Documentation:   Patient extubated after hearing an audible cuff leak to a 4LNC. BS are equal with no stridor present. Patients spo2 did start to drop to 79% patient placed on partial rebreather, spo2 is currently 100%. Nurse aware and at bedside. Follow up ABG in 1 hour. NIF prior to extubation was -40, VC was 1.6 liters.   Evaluation  O2 sats: stable throughout Complications: No apparent complications Patient did tolerate procedure well. Bilateral Breath Sounds: Clear, Diminished Suctioning: Airway Yes  Brooke Bonito 04/01/2015, 6:34 PM

## 2015-04-01 NOTE — Anesthesia Procedure Notes (Signed)
Procedure Name: Intubation Date/Time: 04/01/2015 8:25 AM Performed by: Gaylene Brooks Pre-anesthesia Checklist: Patient identified, Timeout performed, Emergency Drugs available, Patient being monitored and Suction available Patient Re-evaluated:Patient Re-evaluated prior to inductionOxygen Delivery Method: Circle system utilized Preoxygenation: Pre-oxygenation with 100% oxygen Intubation Type: IV induction Ventilation: Mask ventilation without difficulty Laryngoscope Size: Miller and 2 Grade View: Grade I Tube type: Oral Tube size: 8.0 mm Number of attempts: 1 Airway Equipment and Method: Stylet Placement Confirmation: ETT inserted through vocal cords under direct vision,  breath sounds checked- equal and bilateral,  positive ETCO2 and CO2 detector Secured at: 22 cm Tube secured with: Tape Dental Injury: Teeth and Oropharynx as per pre-operative assessment

## 2015-04-01 NOTE — OR Nursing (Signed)
12:10 - 40 minute call to SICU charge nurse - also informed right femoral A-line

## 2015-04-01 NOTE — Anesthesia Preprocedure Evaluation (Addendum)
Anesthesia Evaluation  Patient identified by MRN, date of birth, ID band Patient awake    Reviewed: Allergy & Precautions, NPO status , Patient's Chart, lab work & pertinent test results  Airway Mallampati: II  TM Distance: >3 FB Neck ROM: Full    Dental  (+) Missing, Dental Advisory Given,    Pulmonary shortness of breath, COPD, former smoker,    breath sounds clear to auscultation       Cardiovascular hypertension, + angina + CAD and +CHF  + dysrhythmias  Rhythm:Regular Rate:Normal     Neuro/Psych    GI/Hepatic Neg liver ROS, GERD  ,  Endo/Other  Hypothyroidism   Renal/GU negative Renal ROS     Musculoskeletal   Abdominal   Peds  Hematology   Anesthesia Other Findings   Reproductive/Obstetrics                            Anesthesia Physical Anesthesia Plan  ASA: IV  Anesthesia Plan: General   Post-op Pain Management:    Induction: Intravenous  Airway Management Planned: Oral ETT  Additional Equipment:   Intra-op Plan:   Post-operative Plan: Post-operative intubation/ventilation  Informed Consent:   Plan Discussed with: CRNA and Anesthesiologist  Anesthesia Plan Comments:         Anesthesia Quick Evaluation

## 2015-04-01 NOTE — Progress Notes (Signed)
Patient placed on CPAP/PSV 5/5 with no complications per protocol. Nurse aware and on standby, will continue to monitor pt.

## 2015-04-01 NOTE — Op Note (Signed)
Tonya Walton, Tonya Walton NO.:  [id number]  MEDICAL RECORD NO.:  [id number]  LOCATION:  2S10C                        FACILITY:  [facility]  PHYSICIAN:  Ivin Poot, M.D.  DATE OF BIRTH:  [birth year]  DATE OF PROCEDURE:  04/01/2015 DATE OF DISCHARGE:                              OPERATIVE REPORT   OPERATION: 1. Coronary artery bypass grafting (left internal mammary artery to     distal diagonal-left anterior descending coronary artery     bifurcation). 2. Placement of right femoral arterial line for blood pressure     monitoring.  PREOPERATIVE DIAGNOSIS:  Progressive angina, severe ostial left anterior descending coronary artery stenosis, hypertension.  POSTOPERATIVE DIAGNOSIS:  Progressive angina, severe ostial left anterior descending coronary artery stenosis, hypertension.  SURGEON:  Ivin Poot, MD  ASSISTANT:  Levonne Lapping, RNFA  ANESTHESIA:  General by Finis Bud, MD  INDICATIONS:  The patient is a morbidly obese, 65 year old female with exertional angina.  Cardiology consultation was completed and cardiac catheterization was recommended.  This demonstrated an ostial 75-80% stenosis of the LAD.  The other vessels had only minimal disease.  Her LV function was fairly well preserved and surgical coronary revascularization was recommended.  I examined the patient in the office and reviewed the images of her cardiac catheterization and echocardiogram and discussed the procedure of CABG for treatment of her severe CAD single-vessel disease.  I discussed with her the expected benefits of the surgery to include relief of symptoms of angina and improved cardiac function, in the future and reduction of risk for MI.  I discussed the details of the operation including the location of the surgical incision, the use of general anesthesia and cardiopulmonary bypass, and expected postoperative hospital recovery.  I discussed with her the risks of  the surgery including the risks of stroke, bleeding, blood transfusion, MI, infection, pulmonary problems including pleural effusion, and death. She understood that because of her morbid obesity that she would be at increased risk for pulmonary problems after thoracic surgery.  After reviewing these issues, she demonstrated her understanding and agreed to proceed with surgery under what I felt was an informed consent.  FINDINGS: 1. Morbid obesity and body habitus made exposure of the heart     difficult.  The aorta was shortened. 2. The LAD was deeply intramyocardial and under a thick layer of     epicardial fat.  The vessel after the bifurcation of the diagonal     was too small to graft, so the anastomosis of the left IMA was     placed to the proximal portion of the diagonal branch which     communicated freely with the LAD. 3. No blood products were required for the surgery.  OPERATIVE PROCEDURE:  The patient was brought to the operating room, placed supine on the operating table.  General anesthesia was induced under invasive hemodynamic monitoring.  There was considerable difficulties placing IV and monitoring lines in this patient because of her body habitus from morbid obesity.  The radial A line was not functioning properly and for that reason, I placed a right femoral artery under sterile prep and drape before we prepped the entire  patient.  After the patient was prepped and draped, a proper time-out was performed.  A transesophageal echo probe was placed by the anesthesiologist.  A sternal incision was made and the sternum was divided.  The sternal elevating retractor was placed and left internal mammary artery was harvested as a pedicle graft from its origin at the left subclavian vessel.  It was 1.5-mm vessel with adequate flow.  The sternal retractor was then placed using the deep blades because of her body habitus and obesity.  The pericardium was opened and  suspended. Heparin was administered.  Pursestrings were placed in the ascending aorta and right atrium.  When the ACT was documented as being therapeutic, the patient was cannulated and placed on cardiopulmonary bypass.  The LAD-diagonal region was dissected in the vessel, there was found to be adequate size for grafting-that being the diagonal branch very close to the origin at the LAD.  Distally, the LAD was too small to graft and proximally the LAD was deeply intramyocardial.  Cardioplegic cannulas were placed both antegrade and retrograde cold blood cardioplegia.  Aortic cross-clamp was applied.  One liter of cold blood cardioplegia was delivered in split doses between the antegrade aortic and retrograde coronary sinus catheters.  There was good cardioplegic arrest.  Cardioplegia was delivered every 20 minutes while the crossclamp was in place.  The heart was positioned to expose the proximal portion of the second diagonal.  The arteriotomy was made and a 1.5 mm probe passed proximally, although deeply intramyocardial as well as distally.  The left IMA pedicle was brought through an opening and the left lateral pericardium was brought down onto the LAD and sewn end-to-side with running 8-0 Prolene.  The anastomosis was tedious because of the poor quality of the coronary artery and the amount of epicardial fat around the side of the anastomosis.  The mammary bulldog was briefly released and there was good flow through the anastomosis.  A single 8-0 suture was placed to achieve complete hemostasis and some bleeding from epicardial fat veins was controlled with careful use of electrocautery. The pedicle was secured to the epicardium with 6-0 Prolene and a dose of retrograde warm blood cardioplegia was given before the cross-clamp was removed.  When the crossclamp was removed, the heart resumed a spontaneous rhythm. The anastomosis appeared to be hemostatic.  Temporary pacing wires  were applied. The patient was rewarmed and reperfused.  The lungs were expanded and the ventilator was resumed.  The patient weaned off cardiopulmonary bypass without difficulty.  The echo showed preserved LV function.  EKG appeared to be normal.  Protamine was administered without adverse reaction and the cannulas were removed.  The mediastinum was irrigated. The superior pericardial fat was closed over the aorta.  Anterior mediastinal and left pleural chest tubes were placed and brought through separate incisions.  The sternum was closed with interrupted steel wire.  The pectoralis fascia was closed with a running #1 Vicryl.  The subcutaneous and skin layers were closed in running Vicryl and sterile dressings were applied. Total cardiopulmonary bypass time was 84 minutes.     Ivin Poot, M.D.     PV/MEDQ  D:  04/01/2015  T:  04/01/2015  Job:  [id number]  cc:   Bartholome Bill, MD

## 2015-04-01 NOTE — Progress Notes (Signed)
The patient was examined and preop studies reviewed. There has been no change from the prior exam and the patient is ready for surgery.   Plan CABG on Tonya Walton

## 2015-04-01 NOTE — Transfer of Care (Signed)
Immediate Anesthesia Transfer of Care Note  Patient: PROMYSE GUNNISON  Procedure(s) Performed: Procedure(s): CORONARY ARTERY BYPASS GRAFTING (CABG) x one , using left mammary artery (N/A) TRANSESOPHAGEAL ECHOCARDIOGRAM (TEE) (N/A)  Patient Location: SICU  Anesthesia Type:General  Level of Consciousness: Patient remains intubated per anesthesia plan  Airway & Oxygen Therapy: Patient remains intubated per anesthesia plan and Patient placed on Ventilator (see vital sign flow sheet for setting)  Post-op Assessment: Report given to RN and Post -op Vital signs reviewed and stable  Post vital signs: Reviewed and stable  Last Vitals:  Filed Vitals:   04/01/15 0633  BP: 137/59  Pulse: 59  Temp: 36.2 C  Resp: 18    Complications: No apparent anesthesia complications

## 2015-04-01 NOTE — Brief Op Note (Signed)
04/01/2015  1:22 PM  PATIENT:  Tonya Walton  65 y.o. female  PRE-OPERATIVE DIAGNOSIS:  CAD 90% ostial LAD  POST-OPERATIVE DIAGNOSIS:  same  PROCEDURE:  Procedure(s): CORONARY ARTERY BYPASS GRAFTING (CABG) x one , using left mammary artery (N/A) TRANSESOPHAGEAL ECHOCARDIOGRAM (TEE) (N/A)  SURGEON:  Surgeon(s) and Role:    * Ivin Poot, MD - Primary  PHYSICIAN ASSISTANT: none ASSISTANTS: TOW RNFA   ANESTHESIA:   general  EBL:  Total I/O In: 2445 [I.V.:2000; Blood:445] Out: 2015 [Urine:550; Blood:1465]  BLOOD ADMINISTERED:none  DRAINS: 1 MT and L PT  LOCAL MEDICATIONS USED:  NONE  SPECIMEN:  No Specimen  DISPOSITION OF SPECIMEN:  N/A  COUNTS:  YES  TOURNIQUET:  * No tourniquets in log *  DICTATION: .Dragon Dictation  PLAN OF CARE: Admit to inpatient   PATIENT DISPOSITION:  ICU - intubated and critically ill.   Delay start of Pharmacological VTE agent (>24hrs) due to surgical blood loss or risk of bleeding: yes

## 2015-04-01 NOTE — Progress Notes (Signed)
  Echocardiogram Echocardiogram Transesophageal has been performed.  Darlina Sicilian M 04/01/2015, 9:00 AM

## 2015-04-01 NOTE — Progress Notes (Signed)
UR Completed. Jaloni Sorber, RN, BSN.  [phone] 

## 2015-04-01 NOTE — Progress Notes (Signed)
CT surgery p.m. Rounds  Status post CABG, morbid obesity Patient sedated but PCO2 remains 48 with mild acidosis Patient probably has restrictive lung disease and possible OSA so will place on BiPAP through tonight Blood pressure is been stable Atrially paced Minimal chest tube drainage Adequate urine output Continue renal dose dopamine until a.m. PA catheter has been nonfunctional, checking mixed venous saturations to assess cardiac output  Tharon Aquas trigt M.D. T CTS

## 2015-04-01 NOTE — Progress Notes (Signed)
UR Completed. Kaliopi Blyden, RN, BSN.  [phone] 

## 2015-04-01 NOTE — Care Management Note (Signed)
Case Management Note  Patient Details  Name: SHONICE CURLING MRN: [id number] Date of Birth: 07/07/1950  Subjective/Objective:   S/p CABG                 Action/Plan:  Pt is from home with husband.  Husband recently had surgery.     Expected Discharge Date:                  Expected Discharge Plan:  [place]  In-House Referral:     Discharge planning Services  CM Consult  Post Acute Care Choice:    Choice offered to:     DME Arranged:    DME Agency:     HH Arranged:    [place] Agency:     Status of Service:  In process, will continue to follow  Medicare Important Message Given:    Date Medicare IM Given:    Medicare IM give by:    Date Additional Medicare IM Given:    Additional Medicare Important Message give by:     If discussed at [place] of Stay Meetings, dates discussed:    Additional Comments:  Maryclare Labrador, RN 04/01/2015, 4:19 PM

## 2015-04-02 ENCOUNTER — Inpatient Hospital Stay (HOSPITAL_COMMUNITY): Payer: PPO

## 2015-04-02 ENCOUNTER — Encounter (HOSPITAL_COMMUNITY): Payer: Self-pay | Admitting: Cardiothoracic Surgery

## 2015-04-02 DIAGNOSIS — Z4682 Encounter for fitting and adjustment of non-vascular catheter: Secondary | ICD-10-CM | POA: Diagnosis not present

## 2015-04-02 DIAGNOSIS — I25119 Atherosclerotic heart disease of native coronary artery with unspecified angina pectoris: Secondary | ICD-10-CM | POA: Diagnosis not present

## 2015-04-02 DIAGNOSIS — I4891 Unspecified atrial fibrillation: Secondary | ICD-10-CM | POA: Diagnosis not present

## 2015-04-02 DIAGNOSIS — I509 Heart failure, unspecified: Secondary | ICD-10-CM | POA: Diagnosis not present

## 2015-04-02 LAB — GLUCOSE, CAPILLARY
Glucose-Capillary: 107 mg/dL — ABNORMAL HIGH (ref 65–99)
Glucose-Capillary: 111 mg/dL — ABNORMAL HIGH (ref 65–99)
Glucose-Capillary: 122 mg/dL — ABNORMAL HIGH (ref 65–99)
Glucose-Capillary: 126 mg/dL — ABNORMAL HIGH (ref 65–99)
Glucose-Capillary: 112 mg/dL — ABNORMAL HIGH (ref 65–99)
Glucose-Capillary: 123 mg/dL — ABNORMAL HIGH (ref 65–99)
Glucose-Capillary: 123 mg/dL — ABNORMAL HIGH (ref 65–99)
Glucose-Capillary: 124 mg/dL — ABNORMAL HIGH (ref 65–99)
Glucose-Capillary: 132 mg/dL — ABNORMAL HIGH (ref 65–99)
Glucose-Capillary: 133 mg/dL — ABNORMAL HIGH (ref 65–99)
Glucose-Capillary: 114 mg/dL — ABNORMAL HIGH (ref 65–99)
Glucose-Capillary: 160 mg/dL — ABNORMAL HIGH (ref 65–99)
Glucose-Capillary: 154 mg/dL — ABNORMAL HIGH (ref 65–99)
Glucose-Capillary: 151 mg/dL — ABNORMAL HIGH (ref 65–99)

## 2015-04-02 LAB — CBC
HCT: 27 % — ABNORMAL LOW (ref 36.0–46.0)
HCT: 27.1 % — ABNORMAL LOW (ref 36.0–46.0)
Hemoglobin: 8.2 g/dL — ABNORMAL LOW (ref 12.0–15.0)
Hemoglobin: 8.2 g/dL — ABNORMAL LOW (ref 12.0–15.0)
MCH: 26.8 pg (ref 26.0–34.0)
MCH: 26.8 pg (ref 26.0–34.0)
MCHC: 30.4 g/dL (ref 30.0–36.0)
MCHC: 30.3 g/dL (ref 30.0–36.0)
MCV: 88.2 fL (ref 78.0–100.0)
MCV: 88.6 fL (ref 78.0–100.0)
Platelets: 189 10*3/uL (ref 150–400)
Platelets: 192 10*3/uL (ref 150–400)
RBC: 3.06 MIL/uL — ABNORMAL LOW (ref 3.87–5.11)
RBC: 3.06 MIL/uL — ABNORMAL LOW (ref 3.87–5.11)
RDW: 15.8 % — ABNORMAL HIGH (ref 11.5–15.5)
RDW: 15.7 % — ABNORMAL HIGH (ref 11.5–15.5)
WBC: 13.3 10*3/uL — ABNORMAL HIGH (ref 4.0–10.5)
WBC: 12.4 10*3/uL — ABNORMAL HIGH (ref 4.0–10.5)

## 2015-04-02 LAB — BASIC METABOLIC PANEL
Anion gap: 8 (ref 5–15)
BUN: 9 mg/dL (ref 6–20)
CO2: 24 mmol/L (ref 22–32)
Calcium: 7.9 mg/dL — ABNORMAL LOW (ref 8.9–10.3)
Chloride: 110 mmol/L (ref 101–111)
Creatinine, Ser: 0.87 mg/dL (ref 0.44–1.00)
GFR calc Af Amer: >60 mL/min (ref >60)
GFR calc non Af Amer: >60 mL/min (ref >60)
Glucose, Bld: 139 mg/dL — ABNORMAL HIGH (ref 65–99)
Potassium: 4.3 mmol/L (ref 3.5–5.1)
Sodium: 142 mmol/L (ref 135–145)

## 2015-04-02 LAB — POCT I-STAT, CHEM 8
BUN: 10 mg/dL (ref 6–20)
Calcium, Ion: 1.14 mmol/L (ref 1.13–1.30)
Chloride: 102 mmol/L (ref 101–111)
Creatinine, Ser: 0.8 mg/dL (ref 0.44–1.00)
Glucose, Bld: 167 mg/dL — ABNORMAL HIGH (ref 65–99)
HCT: 26 % — ABNORMAL LOW (ref 36.0–46.0)
Hemoglobin: 8.8 g/dL — ABNORMAL LOW (ref 12.0–15.0)
Potassium: 3.7 mmol/L (ref 3.5–5.1)
Sodium: 136 mmol/L (ref 135–145)
TCO2: 26 mmol/L (ref 0–100)

## 2015-04-02 LAB — CREATININE, SERUM
Creatinine, Ser: 0.97 mg/dL (ref 0.44–1.00)
GFR calc Af Amer: >60 mL/min (ref >60)
GFR calc non Af Amer: 60 mL/min — ABNORMAL LOW (ref >60)

## 2015-04-02 LAB — CARBOXYHEMOGLOBIN
Carboxyhemoglobin: 1.3 % (ref 0.5–1.5)
Carboxyhemoglobin: 1.3 % (ref 0.5–1.5)
Methemoglobin: 0.8 % (ref 0.0–1.5)
Methemoglobin: 0.9 % (ref 0.0–1.5)
O2 Saturation: 66.7 %
O2 Saturation: 48.4 %
Total hemoglobin: 8.4 g/dL — ABNORMAL LOW (ref 12.0–16.0)
Total hemoglobin: 7.9 g/dL — ABNORMAL LOW (ref 12.0–16.0)

## 2015-04-02 LAB — MAGNESIUM
Magnesium: 2.5 mg/dL — ABNORMAL HIGH (ref 1.7–2.4)
Magnesium: 2.2 mg/dL (ref 1.7–2.4)

## 2015-04-02 MED ORDER — FUROSEMIDE 10 MG/ML IJ SOLN
20.0000 mg | Freq: Every day | INTRAMUSCULAR | Status: DC
  Administered 2015-04-03: 20 mg via INTRAVENOUS
  Filled 2015-04-02: qty 2

## 2015-04-02 MED ORDER — FUROSEMIDE 10 MG/ML IJ SOLN
20.0000 mg | Freq: Two times a day (BID) | INTRAMUSCULAR | Status: DC
  Administered 2015-04-02: 20 mg via INTRAVENOUS
  Filled 2015-04-02: qty 2

## 2015-04-02 MED ORDER — LEVALBUTEROL HCL 1.25 MG/0.5ML IN NEBU
1.2500 mg | INHALATION_SOLUTION | Freq: Three times a day (TID) | RESPIRATORY_TRACT | Status: DC
  Administered 2015-04-03 – 2015-04-05 (×7): 1.25 mg via RESPIRATORY_TRACT
  Filled 2015-04-02 (×8): qty 0.5

## 2015-04-02 MED ORDER — GUAIFENESIN ER 600 MG PO TB12
600.0000 mg | ORAL_TABLET | Freq: Two times a day (BID) | ORAL | Status: DC
  Administered 2015-04-02 – 2015-04-06 (×8): 600 mg via ORAL
  Filled 2015-04-02 (×9): qty 1

## 2015-04-02 MED ORDER — ALBUTEROL SULFATE (2.5 MG/3ML) 0.083% IN NEBU: 2.5000 mg | INHALATION_SOLUTION | RESPIRATORY_TRACT | Status: DC | PRN

## 2015-04-02 MED ORDER — FE FUMARATE-B12-VIT C-FA-IFC PO CAPS
1.0000 | ORAL_CAPSULE | Freq: Three times a day (TID) | ORAL | Status: DC
  Administered 2015-04-02 – 2015-04-06 (×12): 1 via ORAL
  Filled 2015-04-02 (×12): qty 1

## 2015-04-02 MED ORDER — POTASSIUM CHLORIDE 10 MEQ/50ML IV SOLN
10.0000 meq | INTRAVENOUS | Status: AC
  Administered 2015-04-02 (×3): 10 meq via INTRAVENOUS
  Filled 2015-04-02: qty 50

## 2015-04-02 MED ORDER — INSULIN ASPART 100 UNIT/ML ~~LOC~~ SOLN: 0.0000 [IU] | SUBCUTANEOUS | Status: DC

## 2015-04-02 MED ORDER — GABAPENTIN 400 MG PO CAPS
1000.0000 mg | ORAL_CAPSULE | Freq: Two times a day (BID) | ORAL | Status: DC
  Administered 2015-04-02 – 2015-04-06 (×8): 1000 mg via ORAL
  Filled 2015-04-02 (×7): qty 2
  Filled 2015-04-02: qty 6

## 2015-04-02 MED ORDER — INSULIN ASPART 100 UNIT/ML ~~LOC~~ SOLN
0.0000 [IU] | SUBCUTANEOUS | Status: DC
  Administered 2015-04-02 (×3): 2 [IU] via SUBCUTANEOUS
  Administered 2015-04-03: 4 [IU] via SUBCUTANEOUS
  Administered 2015-04-03 (×2): 2 [IU] via SUBCUTANEOUS

## 2015-04-02 MED FILL — Electrolyte-R (PH 7.4) Solution: INTRAVENOUS | Qty: 3000 | Status: AC

## 2015-04-02 MED FILL — Sodium Chloride IV Soln 0.9%: INTRAVENOUS | Qty: 3000 | Status: AC

## 2015-04-02 MED FILL — Heparin Sodium (Porcine) Inj 1000 Unit/ML: INTRAMUSCULAR | Qty: 20 | Status: AC

## 2015-04-02 MED FILL — Sodium Bicarbonate IV Soln 8.4%: INTRAVENOUS | Qty: 50 | Status: AC

## 2015-04-02 MED FILL — Mannitol IV Soln 20%: INTRAVENOUS | Qty: 500 | Status: AC

## 2015-04-02 MED FILL — Lidocaine HCl IV Inj 20 MG/ML: INTRAVENOUS | Qty: 5 | Status: AC

## 2015-04-02 NOTE — Discharge Instructions (Signed)
Activity: 1.May walk up steps °               2.No lifting more than ten pounds for four weeks.  °               3.No driving for four weeks. °               4.Stop any activity that causes chest pain, shortness of breath, dizziness, sweating or excessive weakness. °               5.Avoid straining. °               6.Continue with your breathing exercises daily. ° °Diet: Diabetic diet and Low fat, Low salt diet ° °Wound Care: May shower.  Clean wounds with mild soap and water daily. Contact the office at [phone] if any problems arise. ° °Coronary Artery Bypass Grafting, Care After °Refer to this sheet in the next few weeks. These instructions provide you with information on caring for yourself after your procedure. Your health care provider may also give you more specific instructions. Your treatment has been planned according to current medical practices, but problems sometimes occur. Call your health care provider if you have any problems or questions after your procedure. °WHAT TO EXPECT AFTER THE PROCEDURE °Recovery from surgery will be different for everyone. Some people feel well after 3 or 4 weeks, while for others it takes longer. After your procedure, it is typical to have the following: °· Nausea and a lack of appetite.   °· Constipation. °· Weakness and fatigue.   °· Depression or irritability.   °· Pain or discomfort at your incision site. °HOME CARE INSTRUCTIONS °· Take medicines only as directed by your health care provider. Do not stop taking medicines or start any new medicines without first checking with your health care provider. °· Take your pulse as directed by your health care provider. °· Perform deep breathing as directed by your health care provider. If you were given a device called an incentive spirometer, use it to practice deep breathing several times a day. Support your chest with a pillow or your arms when you take deep breaths or cough. °· Keep incision areas clean, dry, and  protected. Remove or change any bandages (dressings) only as directed by your health care provider. You may have skin adhesive strips over the incision areas. Do not take the strips off. They will fall off on their own. °· Check incision areas daily for any swelling, redness, or drainage. °· If incisions were made in your legs, do the following: °¨ Avoid crossing your legs.   °¨ Avoid sitting for long periods of time. Change positions every 30 minutes.   °¨ Elevate your legs when you are sitting. °· Wear compression stockings as directed by your health care provider. These stockings help keep blood clots from forming in your legs. °· Take showers once your health care provider approves. Until then, only take sponge baths. Pat incisions dry. Do not rub incisions with a washcloth or towel. Do not take baths, swim, or use a hot tub until your health care provider approves. °· Eat foods that are high in fiber, such as raw fruits and vegetables, whole grains, beans, and nuts. Meats should be lean cut. Avoid canned, processed, and fried foods. °· Drink enough fluid to keep your urine clear or pale yellow. °· Weigh yourself every day. This helps identify if you are retaining fluid that may make your heart and lungs   and lungs work harder. °· Rest and limit activity as directed by your health care provider. You may be instructed to: °¨ Stop any activity at once if you have chest pain, shortness of breath, irregular heartbeats, or dizziness. Get help right away if you have any of these symptoms. °¨ Move around frequently for short periods or take short walks as directed by your health care provider. Increase your activities gradually. You may need physical therapy or cardiac rehabilitation to help strengthen your muscles and build your endurance. °¨ Avoid lifting, pushing, or pulling anything heavier than 10 lb (4.5 kg) for at least 6 weeks after surgery. °· Do not drive until your health care provider approves.  °· Ask your health  care provider when you may return to work. °· Ask your health care provider when you may resume sexual activity. °· Keep all follow-up visits as directed by your health care provider. This is important. °SEEK MEDICAL CARE IF: °· You have swelling, redness, increasing pain, or drainage at the site of an incision. °· You have a fever. °· You have swelling in your ankles or legs. °· You have pain in your legs.   °· You gain 2 or more pounds (0.9 kg) a day. °· You are nauseous or vomit. °· You have diarrhea.  °SEEK IMMEDIATE MEDICAL CARE IF: °· You have chest pain that goes to your jaw or arms. °· You have shortness of breath.   °· You have a fast or irregular heartbeat.   °· You notice a "clicking" in your breastbone (sternum) when you move.   °· You have numbness or weakness in your arms or legs. °· You feel dizzy or light-headed.   °MAKE SURE YOU: °· Understand these instructions. °· Will watch your condition. °· Will get help right away if you are not doing well or get worse. °  °This information is not intended to replace advice given to you by your health care provider. Make sure you discuss any questions you have with your health care provider. °  °Document Released: 08/05/2004 Document Revised: 02/06/2014 Document Reviewed: 06/25/2012 °Elsevier Interactive Patient Education ©2016 [employer] ° °

## 2015-04-02 NOTE — Progress Notes (Signed)
1 Day Post-Op Procedure(s) (LRB): CORONARY ARTERY BYPASS GRAFTING (CABG) x one , using left mammary artery (N/A) TRANSESOPHAGEAL ECHOCARDIOGRAM (TEE) (N/A) Subjective:  Extubated- needed BIPAP overnight for hypercarbia CXR clear Hemodynamics stable on low doe neo, dopamine nsr 75-80/ min   Objective: Vital signs in last 24 hours: Temp:  [97.3 F (36.3 C)-100.2 F (37.9 C)] 99.1 F (37.3 C) (03/03 0400) Pulse Rate:  [25-90] 83 (03/03 0752) Cardiac Rhythm:  [-] Normal sinus rhythm (03/03 0600) Resp:  [14-30] 24 (03/03 0752) BP: (70-142)/(32-87) 111/50 mmHg (03/03 0752) SpO2:  [70 %-100 %] 92 % (03/03 0753) Arterial Line BP: (75-196)/(43-88) 188/84 mmHg (03/03 0630) FiO2 (%):  [40 %-50 %] 50 % (03/03 0600)  Hemodynamic parameters for last 24 hours: PAP: (41-50)/(35-47) 41/35 mmHg CVP:  [12 mmHg-20 mmHg] 15 mmHg CO:  [3.9 L/min-8.2 L/min] 8.2 L/min CI:  [1.8 L/min/m2-3.9 L/min/m2] 3.9 L/min/m2  Intake/Output from previous day: 03/02 0701 - 03/03 0700 In: 6007.9 [I.V.:4332.9; Blood:445; NG/GT:30; IV Piggyback:1200] Out: [id number] [Urine:2485; Blood:1465; Chest Tube:350] Intake/Output this shift:         Exam    General- alert and comfortable   Lungs- clear without rales, wheezes   Cor- regular rate and rhythm, no murmur , gallop   Abdomen- soft, non-tender   Extremities - warm, non-tender, minimal edema   Neuro- oriented, appropriate, no focal weakness   Lab Results:  Recent Labs  04/01/15 2015 04/02/15 0400  WBC 13.3* 13.3*  HGB 8.5* 8.2*  HCT 28.5* 27.0*  PLT 206 189   BMET:  Recent Labs  03/30/15 1555  04/01/15 2008 04/01/15 2015 04/02/15 0400  NA 143  < > 141  --  142  K 4.6  < > 4.5  --  4.3  CL 107  < > 108  --  110  CO2 21*  --   --   --  24  GLUCOSE 90  < > 133*  --  139*  BUN 16  < > 12  --  9  CREATININE 0.80  < > 0.70 0.89 0.87  CALCIUM 9.3  --   --   --  7.9*  < > = values in this interval not displayed.  PT/INR:  Recent Labs   04/01/15 1420  LABPROT 17.8*  INR 1.46   ABG    Component Value Date/Time   PHART 7.286* 04/01/2015 2019   HCO3 22.9 04/01/2015 2019   TCO2 24 04/01/2015 2019   ACIDBASEDEF 4.0* 04/01/2015 2019   O2SAT 66.7 04/02/2015 0355   CBG (last 3)   Recent Labs  04/02/15 0357 04/02/15 0500 04/02/15 0607  GLUCAP 132* 123* 133*    Assessment/Plan: S/P Procedure(s) (LRB): CORONARY ARTERY BYPASS GRAFTING (CABG) x one , using left mammary artery (N/A) TRANSESOPHAGEAL ECHOCARDIOGRAM (TEE) (N/A) Mobilize Diuresis Diabetes control d/c tubes/lines   LOS: 1 day    Tharon Aquas Trigt III 04/02/2015

## 2015-04-02 NOTE — Care Management Note (Signed)
Case Management Note  Patient Details  Name: Tonya Walton MRN: [id number] Date of Birth: 13-Dec-1950  Subjective/Objective:   S/p CABG                 Action/Plan:  04/02/2015 CM meet with both pt and husband; husband states he is able to care for pt upon discharge.  04/01/15  Pt is from home with husband.  Husband recently had surgery.     Expected Discharge Date:                  Expected Discharge Plan:  [place]  In-House Referral:     Discharge planning Services  CM Consult  Post Acute Care Choice:    Choice offered to:     DME Arranged:    DME Agency:     HH Arranged:    [place] Agency:     Status of Service:  In process, will continue to follow  Medicare Important Message Given:    Date Medicare IM Given:    Medicare IM give by:    Date Additional Medicare IM Given:    Additional Medicare Important Message give by:     If discussed at [place] of Stay Meetings, dates discussed:    Additional Comments:  Maryclare Labrador, RN 04/02/2015, 1:33 PM

## 2015-04-02 NOTE — Discharge Summary (Signed)
Physician Discharge Summary       Tonya Walton[address]       Tonya Walton,Tonya Walton [address]             [phone]    Patient ID: Tonya Walton MRN: [id number] DOB/AGE: 1950/02/13 65 y.o.  Admit date: 04/01/2015 Discharge date: 04/06/2015  Admission Diagnosis: Single vessel CAD  Active Diagnoses:  1. Hypertension 2. COPD (chronic obstructive pulmonary disease) ([place]) 3. CHF (congestive heart failure) ([place]) 4. Atrial fibrillation, controlled ([place]) 5. GERD (gastroesophageal reflux disease) 6. Hypothyroidism 7. Restless leg syndrome 8. History of right breast cancer 9. Hypothyroidism 10. Anxiety 11. Depression 12. Tobacco abuse 13. ABL anemia  Procedure (s):  1. Coronary artery bypass grafting (left internal mammary artery to distal diagonal-left anterior descending coronary artery bifurcation). 2. Placement of right femoral arterial line for blood pressure  monitoring by Dr. Prescott Walton on 04/01/2015.  History of Presenting Illness: The patient is a 89 year oldobese Caucasian female reformed smoker nondiabetic with exertional chest pain and shortness of breath that started earlier this year. She was evaluated by Dr. Ubaldo Walton and cardiac catheterization was recommended. This was performed on January 31 at [facility] via right femoral artery access. LVEF was normal. LVEDP was 12. Right coronary was small nondominant. The LAD had an ostial 90% stenosis. The circumflex had no significant disease. The patient was not felt to be candidate for PCI and CT surgical evaluation has been requested. Since the cardiac catheterization, the patient's angina has remained fairly stable. She denies any resting symptoms or nocturnal angina. She has not taken nitroglycerin.  Patient's cardiac history is positive for intermittent atrial fibrillation. She is currently in sinus rhythm. She has been on no anticoagulation except for a brief period several years ago.the patient brother  has had heart bypass surgery and 2 other brothers have had myocardial infarction. Of note the patient's husband had an MI on the same week the patient had her heart cath and underwent urgent cardiac catheterization with PCI at [facility] and now is doing well.  The patient denies diabetes, DVT, or allergies to medications.  The patient had a adenocarcinoma of the right breast in 2012 treated with lumpectomy and radiation therapy which included radiation to the mediastinum. There is a tattoo mark over the sternum.  Dr. Prescott Walton discussed the need for single vessel coronary artery bypass grafting surgery. Potential risks, benefits, and complications of the surgery were discussed with the patient and she agreed to proceed with surgery. Pre operative carotid duplex showed no significant internal carotid artery stenosis bilaterally and ABIs were > 1 bilaterally.  Brief Hospital Course:  The patient was extubated the evening of surgery without difficulty. She remained afebrile and hemodynamically stable. She was weaned off of Dopamine and Neo Synephrine drips. She did need bipap overnight for hypercarbia. Tonya Walton, a line, chest tubes, and foley were removed early in the post operative course. Lopressor was started and titrated accordingly. She was volume over loaded and diuresed. She had ABL anemia. She did not require a post op transfusion. His last H and H was 8.2 and 27. She was weaned off the insulin drip. The patient's HGA1C pre op was 5.9.  The patient was felt surgically stable for transfer from the ICU to PCTU for further convalescence on 03/**/2017. She continues to progress with cardiac rehab. She was ambulating on room air. She has been tolerating a diet and has had a bowel movement. Epicardial pacing wires and chest tube sutures will  be removed prior to discharge. The patient is felt surgically stable for discharge today.   Latest Vital Signs: Blood pressure 132/84, pulse 81, temperature 98.5 F  (36.9 C), temperature source Oral, resp. rate 18, height _0  (1.676 m), weight 229 lb 4.8 oz (104.01 kg), SpO2 98 %.  Physical Exam: General- alert and comfortable  Lungs- clear without rales, wheezes  Cor- regular rate and rhythm, no murmur , gallop  Abdomen- soft, non-tender  Extremities - warm, non-tender, minimal edema  Neuro- oriented, appropriate, no focal weakness  Discharge Condition:Stable and discharged to home  Recent laboratory studies:  Lab Results  Component Value Date   WBC 7.4 04/06/2015   HGB 7.5* 04/06/2015   HCT 25.0* 04/06/2015   MCV 87.7 04/06/2015   PLT 281 04/06/2015   Lab Results  Component Value Date   NA 142 04/06/2015   K 4.0 04/06/2015   CL 103 04/06/2015   CO2 27 04/06/2015   CREATININE 0.70 04/06/2015   GLUCOSE 107* 04/06/2015    Diagnostic Studies:  Dg Chest Port 1 View  04/02/2015  CLINICAL DATA:  Status post coronary artery bypass grafting. Recent extubation EXAM: PORTABLE CHEST 1 VIEW COMPARISON:  April 01, 2015 FINDINGS: Endotracheal tube has been removed. Left chest tube and mediastinal drain remain in place. Swan-Ganz catheter tip is in the right main pulmonary artery. No pneumothorax. There is atelectatic change in the left lower lobe. Lungs elsewhere clear. Heart is mildly enlarged with pulmonary vascularity within normal limits. No adenopathy. IMPRESSION: Tube and catheter positions as described without pneumothorax. Persistent left lower lobe atelectatic change. No new opacity. No change in cardiac silhouette. Electronically Signed   By: Tonya Walton M.D.   On: 04/02/2015 07:51   Discharge Medications:   Medication List    STOP taking these medications        isosorbide mononitrate 30 MG 24 hr tablet  Commonly known as:  Tonya Walton     meloxicam 15 MG tablet  Commonly known as:  Tonya Walton     metoprolol succinate 50 MG 24 hr tablet  Commonly known as:  Tonya Walton      TAKE these medications        anastrozole 1 MG  tablet  Commonly known as:  Tonya Walton  Take 1 mg by mouth daily.     aspirin 325 MG EC tablet  Take 1 tablet (325 mg total) by mouth daily.     atorvastatin 40 MG tablet  Commonly known as:  LIPITOR  Take 1 tablet (40 mg total) by mouth daily at 6 PM.     clonazePAM 0.5 MG tablet  Commonly known as:  KLONOPIN  Take 2 tablets (1 mg total) by mouth at bedtime.     [employer] B-12 1000 MCG/15ML Liqd  Generic drug:  Cyanocobalamin  Take 1,000 mcg by mouth every Monday, Wednesday, and Friday. Rotates weekly with the injection     B-12 COMPLIANCE INJECTION 1000 MCG/ML Kit  Generic drug:  Cyanocobalamin  Inject 1,000 mcg as directed every Monday, Wednesday, and Friday.     cyclobenzaprine 10 MG tablet  Commonly known as:  FLEXERIL  Take 10 mg by mouth daily.     DRY EYES OP  Place 1 drop into both eyes at bedtime.     ferrous [id number] C-folic acid capsule  Commonly known as:  TRINSICON / FOLTRIN  Take 1 capsule by mouth 3 (three) times daily after meals.     FLUoxetine 40 MG capsule  Commonly known  as:  PROZAC  Take 1 capsule (40 mg total) by mouth daily.     furosemide 40 MG tablet  Commonly known as:  LASIX  Take 40 mg by mouth daily as needed for fluid.     gabapentin 300 MG capsule  Commonly known as:  NEURONTIN  Take 1,500 mg by mouth 2 (two) times daily. 1500 mg AM, 1500 mg PM     levothyroxine 25 MCG tablet  Commonly known as:  SYNTHROID, LEVOTHROID  Take 25 mcg by mouth daily.     lisinopril 5 MG tablet  Commonly known as:  PRINIVIL,ZESTRIL  Take 1 tablet (5 mg total) by mouth daily.     metoprolol tartrate 25 MG tablet  Commonly known as:  LOPRESSOR  Take 0.5 tablets (12.5 mg total) by mouth 2 (two) times daily.     montelukast 10 MG tablet  Commonly known as:  SINGULAIR  Take 10 mg by mouth at bedtime.     oxybutynin 5 MG tablet  Commonly known as:  DITROPAN  Take 5 mg by mouth 3 (three) times daily as needed for bladder spasms.      pantoprazole 40 MG tablet  Commonly known as:  PROTONIX  Take 40 mg by mouth daily. AM     potassium chloride SA 20 MEQ tablet  Commonly known as:  K-DUR,KLOR-CON  Take 1 tablet (20 mEq total) by mouth daily. If taking Lasix     pramipexole 0.25 MG tablet  Commonly known as:  MIRAPEX  Take 0.25-0.5 mg by mouth 3 (three) times daily. Pt takes two tablets in the morning, one tablet at noon, and two tablets at bedtime.     traMADol 50 MG tablet  Commonly known as:  ULTRAM  Take 1-2 tablets (50-100 mg total) by mouth every 4 (four) hours as needed for moderate pain.     zolpidem 5 MG tablet  Commonly known as:  AMBIEN  Take 1 tablet (5 mg total) by mouth at bedtime.       The patient has been discharged on:   1.Beta Blocker:  Yes [  x ]                              No   [   ]                              If No, reason:  2.Ace Inhibitor/ARB: Yes [ x  ]                                     No  [    ]                                     If No, reason:  3.Statin:   Yes [x   ]                  No  [   ]                  If No, reason:  4.Ecasa:  Yes  [ x  ]  No   [   ]                  If No, reason:  Follow Up Appointments: Follow-up Information    Follow up with Teodoro Spray., MD.   Specialty:  Cardiology   Why:  Call for a follow up appointment for 2 weeks   Contact information:   [address] [place] [address] [phone]       Follow up with Ivin Poot III, MD On 05/05/2015.   Specialty:  Cardiothoracic Surgery   Why:  PA/LAT CXR to be taken (at [facility] which is in the same building as Dr. Lucianne Lei Trigt''s office) on 05/05/2015 at 9:45 am;Appointment time is at 10:30 am   Contact information:   [place] [address] [phone]       Follow up with Rusty Aus, MD.   Specialty:  Internal Medicine   Why:  Call for follow up of HGA1C pre op 5.9 (pre  diabetes)   Contact information:   [place]  [address] [phone]       Signed: Cinda Quest 04/06/2015, 8:11 AM

## 2015-04-02 NOTE — Progress Notes (Signed)
Patient taken off of BIPAP with no complications, placed on 2LNC with a spo2 of 92%. BIPAP is currently on standby if needed. Nurse is aware, will continue to monitor pt.

## 2015-04-02 NOTE — Progress Notes (Signed)
Patient ID: Tonya Walton, female   DOB: [birth year], 65 y.o.   MRN: [id number]  SICU Evening Rounds:   Hemodynamically stable, sinus 80 dop 3.  Walked a little today but got weak and just about passed out according to nurses   Urine output good    CBC    Component Value Date/Time   WBC 12.4* 04/02/2015 1605   WBC 6.5 05/27/2014 0840   RBC 3.06* 04/02/2015 1605   RBC 4.18 05/27/2014 0840   HGB 8.8* 04/02/2015 1608   HGB 12.0 05/27/2014 0840   HCT 26.0* 04/02/2015 1608   HCT 37.8 05/27/2014 0840   PLT 192 04/02/2015 1605   PLT 271 05/27/2014 0840   MCV 88.6 04/02/2015 1605   MCV 90 05/27/2014 0840   MCH 26.8 04/02/2015 1605   MCH 28.8 05/27/2014 0840   MCHC 30.3 04/02/2015 1605   MCHC 31.8* 05/27/2014 0840   RDW 15.7* 04/02/2015 1605   RDW 14.3 05/27/2014 0840   LYMPHSABS 2.1 08/11/2013 1122   MONOABS 0.6 08/11/2013 1122   EOSABS 0.2 08/11/2013 1122   BASOSABS 0.1 08/11/2013 1122     BMET    Component Value Date/Time   NA 136 04/02/2015 1608   NA 141 05/27/2014 0840   K 3.7 04/02/2015 1608   K 4.1 05/27/2014 0840   CL 102 04/02/2015 1608   CL 107 05/27/2014 0840   CO2 24 04/02/2015 0400   CO2 25 05/27/2014 0840   GLUCOSE 167* 04/02/2015 1608   GLUCOSE 154* 05/27/2014 0840   BUN 10 04/02/2015 1608   BUN 26* 05/27/2014 0840   CREATININE 0.80 04/02/2015 1608   CREATININE 0.75 05/27/2014 0840   CALCIUM 7.9* 04/02/2015 0400   CALCIUM 8.9 05/27/2014 0840   GFRNONAA 60* 04/02/2015 1605   GFRNONAA >60 05/27/2014 0840   GFRAA >60 04/02/2015 1605   GFRAA >60 05/27/2014 0840     A/P:  Stable postop course. Continue current plans

## 2015-04-03 ENCOUNTER — Inpatient Hospital Stay (HOSPITAL_COMMUNITY): Payer: PPO

## 2015-04-03 DIAGNOSIS — R918 Other nonspecific abnormal finding of lung field: Secondary | ICD-10-CM | POA: Diagnosis not present

## 2015-04-03 LAB — CBC
HCT: 27.4 % — ABNORMAL LOW (ref 36.0–46.0)
Hemoglobin: 8.3 g/dL — ABNORMAL LOW (ref 12.0–15.0)
MCH: 26.6 pg (ref 26.0–34.0)
MCHC: 30.3 g/dL (ref 30.0–36.0)
MCV: 87.8 fL (ref 78.0–100.0)
Platelets: 175 10*3/uL (ref 150–400)
RBC: 3.12 MIL/uL — ABNORMAL LOW (ref 3.87–5.11)
RDW: 15.8 % — ABNORMAL HIGH (ref 11.5–15.5)
WBC: 12.5 10*3/uL — ABNORMAL HIGH (ref 4.0–10.5)

## 2015-04-03 LAB — CARBOXYHEMOGLOBIN
Carboxyhemoglobin: 1.6 % — ABNORMAL HIGH (ref 0.5–1.5)
Methemoglobin: 0.8 % (ref 0.0–1.5)
O2 Saturation: 56.8 %
Total hemoglobin: 7.6 g/dL — ABNORMAL LOW (ref 12.0–16.0)

## 2015-04-03 LAB — GLUCOSE, CAPILLARY
Glucose-Capillary: 112 mg/dL — ABNORMAL HIGH (ref 65–99)
Glucose-Capillary: 131 mg/dL — ABNORMAL HIGH (ref 65–99)
Glucose-Capillary: 138 mg/dL — ABNORMAL HIGH (ref 65–99)
Glucose-Capillary: 176 mg/dL — ABNORMAL HIGH (ref 65–99)
Glucose-Capillary: 88 mg/dL (ref 65–99)
Glucose-Capillary: 89 mg/dL (ref 65–99)

## 2015-04-03 LAB — BASIC METABOLIC PANEL
Anion gap: 7 (ref 5–15)
BUN: 10 mg/dL (ref 6–20)
CO2: 26 mmol/L (ref 22–32)
Calcium: 8.4 mg/dL — ABNORMAL LOW (ref 8.9–10.3)
Chloride: 105 mmol/L (ref 101–111)
Creatinine, Ser: 0.72 mg/dL (ref 0.44–1.00)
GFR calc Af Amer: >60 mL/min (ref >60)
GFR calc non Af Amer: >60 mL/min (ref >60)
Glucose, Bld: 115 mg/dL — ABNORMAL HIGH (ref 65–99)
Potassium: 4.5 mmol/L (ref 3.5–5.1)
Sodium: 138 mmol/L (ref 135–145)

## 2015-04-03 MED ORDER — FUROSEMIDE 10 MG/ML IJ SOLN
40.0000 mg | Freq: Two times a day (BID) | INTRAMUSCULAR | Status: AC
  Administered 2015-04-03 (×2): 40 mg via INTRAVENOUS
  Filled 2015-04-03 (×2): qty 4

## 2015-04-03 MED ORDER — POTASSIUM CHLORIDE CRYS ER 20 MEQ PO TBCR
20.0000 meq | EXTENDED_RELEASE_TABLET | Freq: Two times a day (BID) | ORAL | Status: AC
  Administered 2015-04-03 (×2): 20 meq via ORAL
  Filled 2015-04-03 (×2): qty 1

## 2015-04-03 MED ORDER — FUROSEMIDE 10 MG/ML IJ SOLN: 40.0000 mg | Freq: Two times a day (BID) | INTRAMUSCULAR | Status: DC

## 2015-04-03 NOTE — Progress Notes (Signed)
2 Days Post-Op Procedure(s) (LRB): CORONARY ARTERY BYPASS GRAFTING (CABG) x one , using left mammary artery (N/A) TRANSESOPHAGEAL ECHOCARDIOGRAM (TEE) (N/A) Subjective:  No complaints  Objective: Vital signs in last 24 hours: Temp:  [98 F (36.7 C)-98.8 F (37.1 C)] 98.8 F (37.1 C) (03/04 0830) Pulse Rate:  [70-129] 98 (03/04 1100) Cardiac Rhythm:  [-] Normal sinus rhythm (03/04 0800) Resp:  [13-28] 27 (03/04 1100) BP: (88-156)/(31-110) 146/55 mmHg (03/04 1100) SpO2:  [91 %-100 %] 96 % (03/04 1100) Weight:  [110.7 kg (244 lb 0.8 oz)] 110.7 kg (244 lb 0.8 oz) (03/04 0500)  Hemodynamic parameters for last 24 hours:    Intake/Output from previous day: 03/03 0701 - 03/04 0700 In: 2142.7 [P.O.:360; I.V.:1132.7; IV Piggyback:650] Out: [id number] [Urine:3105; Chest Tube:120] Intake/Output this shift: Total I/O In: 103.2 [I.V.:103.2] Out: 425 [Urine:425]  General appearance: alert and cooperative Neurologic: intact Heart: regular rate and rhythm, S1, S2 normal, no murmur, click, rub or gallop Lungs: clear to auscultation bilaterally Extremities: edema moderate Wound: Aquacel in place  Lab Results:  Recent Labs  04/02/15 1605 04/02/15 1608 04/03/15 0430  WBC 12.4*  --  12.5*  HGB 8.2* 8.8* 8.3*  HCT 27.1* 26.0* 27.4*  PLT 192  --  175   BMET:  Recent Labs  04/02/15 0400  04/02/15 1608 04/03/15 0430  NA 142  --  136 138  K 4.3  --  3.7 4.5  CL 110  --  102 105  CO2 24  --   --  26  GLUCOSE 139*  --  167* 115*  BUN 9  --  10 10  CREATININE 0.87  < > 0.80 0.72  CALCIUM 7.9*  --   --  8.4*  < > = values in this interval not displayed.  PT/INR:  Recent Labs  04/01/15 1420  LABPROT 17.8*  INR 1.46   ABG    Component Value Date/Time   PHART 7.286* 04/01/2015 2019   HCO3 22.9 04/01/2015 2019   TCO2 26 04/02/2015 1608   ACIDBASEDEF 4.0* 04/01/2015 2019   O2SAT 56.8 04/03/2015 0440   CBG (last 3)   Recent Labs  04/02/15 2336 04/03/15 0422 04/03/15 0829   GLUCAP 131* 88 66   CLINICAL DATA: Status post CABG  EXAM: PORTABLE CHEST 1 VIEW  COMPARISON: April 02, 2015  FINDINGS: The PA catheter has been removed with a right IJ sheath remaining. No pneumothorax. Stable cardiomegaly. Opacity in the left retrocardiac region is stable. Recommend follow-up to resolution. No other interval changes or acute abnormalities.  IMPRESSION: Stable left retrocardiac opacity. Recommend follow-up to resolution. No other significant changes.   Electronically Signed  By: Dorise Bullion III M.D  On: 04/03/2015 07:59  Assessment/Plan: S/P Procedure(s) (LRB): CORONARY ARTERY BYPASS GRAFTING (CABG) x one , using left mammary artery (N/A) TRANSESOPHAGEAL ECHOCARDIOGRAM (TEE) (N/A)  She is hemodynamically stable. Wean dopamine Mobilize Diuresis Continue foley due to diuresing patient and patient in ICU   LOS: 2 days    Gaye Pollack 04/03/2015

## 2015-04-03 NOTE — Progress Notes (Signed)
Patient ID: Tonya Walton, female   DOB: [birth year], 65 y.o.   MRN: [id number]  SICU Evening Rounds:  Hemodynamically stable  Diuresed well today  Ambulated around ICU without difficulty  Continue current plans

## 2015-04-04 ENCOUNTER — Encounter (HOSPITAL_COMMUNITY): Payer: Self-pay | Admitting: *Deleted

## 2015-04-04 LAB — CARBOXYHEMOGLOBIN
Carboxyhemoglobin: 2 % — ABNORMAL HIGH (ref 0.5–1.5)
Methemoglobin: 0.7 % (ref 0.0–1.5)
O2 Saturation: 64.6 %
Total hemoglobin: 6.7 g/dL — CL (ref 12.0–16.0)

## 2015-04-04 LAB — BASIC METABOLIC PANEL
ANION GAP: 9 (ref 5–15)
BUN: 11 mg/dL (ref 6–20)
CALCIUM: 8.2 mg/dL — AB (ref 8.9–10.3)
CO2: 29 mmol/L (ref 22–32)
CREATININE: 0.72 mg/dL (ref 0.44–1.00)
Chloride: 101 mmol/L (ref 101–111)
GFR calc Af Amer: >60 mL/min (ref >60)
GLUCOSE: 109 mg/dL — AB (ref 65–99)
Potassium: 3.8 mmol/L (ref 3.5–5.1)
Sodium: 139 mmol/L (ref 135–145)

## 2015-04-04 LAB — GLUCOSE, CAPILLARY
Glucose-Capillary: 100 mg/dL — ABNORMAL HIGH (ref 65–99)
Glucose-Capillary: 104 mg/dL — ABNORMAL HIGH (ref 65–99)
Glucose-Capillary: 119 mg/dL — ABNORMAL HIGH (ref 65–99)
Glucose-Capillary: 94 mg/dL (ref 65–99)

## 2015-04-04 LAB — CBC
HCT: 24.1 % — ABNORMAL LOW (ref 36.0–46.0)
HEMOGLOBIN: 7.4 g/dL — AB (ref 12.0–15.0)
MCH: 26.8 pg (ref 26.0–34.0)
MCHC: 30.7 g/dL (ref 30.0–36.0)
MCV: 87.3 fL (ref 78.0–100.0)
PLATELETS: 173 10*3/uL (ref 150–400)
RBC: 2.76 MIL/uL — ABNORMAL LOW (ref 3.87–5.11)
RDW: 15.8 % — AB (ref 11.5–15.5)
WBC: 9.6 10*3/uL (ref 4.0–10.5)

## 2015-04-04 MED ORDER — POTASSIUM CHLORIDE CRYS ER 20 MEQ PO TBCR
40.0000 meq | EXTENDED_RELEASE_TABLET | Freq: Once | ORAL | Status: AC
  Administered 2015-04-04: 40 meq via ORAL
  Filled 2015-04-04: qty 2

## 2015-04-04 MED ORDER — FUROSEMIDE 10 MG/ML IJ SOLN
40.0000 mg | Freq: Once | INTRAMUSCULAR | Status: AC
  Administered 2015-04-04: 40 mg via INTRAVENOUS
  Filled 2015-04-04: qty 4

## 2015-04-04 NOTE — Plan of Care (Signed)
Problem: Physical Regulation: Goal: Diagnostic test results will improve Outcome: Progressing Watch low hemaglobin

## 2015-04-04 NOTE — Progress Notes (Signed)
3 Days Post-Op Procedure(s) (LRB): CORONARY ARTERY BYPASS GRAFTING (CABG) x one , using left mammary artery (N/A) TRANSESOPHAGEAL ECHOCARDIOGRAM (TEE) (N/A) Subjective: No complaints  Objective: Vital signs in last 24 hours: Temp:  [98 F (36.7 C)-102.1 F (38.9 C)] 98.2 F (36.8 C) (03/05 1141) Pulse Rate:  [71-100] 90 (03/05 0800) Cardiac Rhythm:  [-] Normal sinus rhythm (03/05 0800) Resp:  [12-27] 20 (03/05 0800) BP: (99-149)/(51-113) 107/65 mmHg (03/05 0800) SpO2:  [90 %-100 %] 98 % (03/05 0813) Weight:  [106.8 kg (235 lb 7.2 oz)] 106.8 kg (235 lb 7.2 oz) (03/05 0500)  Hemodynamic parameters for last 24 hours:    Intake/Output from previous day: 03/04 0701 - 03/05 0700 In: 628.7 [P.O.:240; I.V.:388.7] Out: 4802 [Urine:4800; Stool:2] Intake/Output this shift: Total I/O In: -  Out: 320 [Urine:320]  General appearance: alert and cooperative Neurologic: intact Heart: regular rate and rhythm, S1, S2 normal, no murmur, click, rub or gallop Lungs: clear to auscultation bilaterally Extremities: edema moderate but decreasing Wound: incision ok  Lab Results:  Recent Labs  04/03/15 0430 04/04/15 0315  WBC 12.5* 9.6  HGB 8.3* 7.4*  HCT 27.4* 24.1*  PLT 175 173   BMET:  Recent Labs  04/03/15 0430 04/04/15 0315  NA 138 139  K 4.5 3.8  CL 105 101  CO2 26 29  GLUCOSE 115* 109*  BUN 10 11  CREATININE 0.72 0.72  CALCIUM 8.4* 8.2*    PT/INR:  Recent Labs  04/01/15 1420  LABPROT 17.8*  INR 1.46   ABG    Component Value Date/Time   PHART 7.286* 04/01/2015 2019   HCO3 22.9 04/01/2015 2019   TCO2 26 04/02/2015 1608   ACIDBASEDEF 4.0* 04/01/2015 2019   O2SAT 64.6 04/04/2015 0330   CBG (last 3)   Recent Labs  04/03/15 2345 04/04/15 0328 04/04/15 0825  GLUCAP 104* 100* 94    Assessment/Plan: S/P Procedure(s) (LRB): CORONARY ARTERY BYPASS GRAFTING (CABG) x one , using left mammary artery (N/A) TRANSESOPHAGEAL ECHOCARDIOGRAM (TEE) (N/A)  She is  hemodynamically stable in sinus rhythm  Diuresing well.  Glucose under good control. Will stop SSI. Preop Hgb A1c 5.9  Continue mobilization and IS.    LOS: 3 days    Tonya Walton 04/04/2015

## 2015-04-05 LAB — BASIC METABOLIC PANEL
Anion gap: 11 (ref 5–15)
BUN: 11 mg/dL (ref 6–20)
CHLORIDE: 106 mmol/L (ref 101–111)
CO2: 25 mmol/L (ref 22–32)
CREATININE: 0.68 mg/dL (ref 0.44–1.00)
Calcium: 8.3 mg/dL — ABNORMAL LOW (ref 8.9–10.3)
GFR calc Af Amer: >60 mL/min (ref >60)
GFR calc non Af Amer: >60 mL/min (ref >60)
GLUCOSE: 106 mg/dL — AB (ref 65–99)
POTASSIUM: 4.3 mmol/L (ref 3.5–5.1)
SODIUM: 142 mmol/L (ref 135–145)

## 2015-04-05 LAB — CBC
HEMATOCRIT: 25.2 % — AB (ref 36.0–46.0)
Hemoglobin: 8 g/dL — ABNORMAL LOW (ref 12.0–15.0)
MCH: 27.8 pg (ref 26.0–34.0)
MCHC: 31.7 g/dL (ref 30.0–36.0)
MCV: 87.5 fL (ref 78.0–100.0)
PLATELETS: 221 10*3/uL (ref 150–400)
RBC: 2.88 MIL/uL — ABNORMAL LOW (ref 3.87–5.11)
RDW: 15.7 % — AB (ref 11.5–15.5)
WBC: 8.6 10*3/uL (ref 4.0–10.5)

## 2015-04-05 MED ORDER — MOVING RIGHT ALONG BOOK
Freq: Once | Status: AC
  Administered 2015-04-05: 08:00:00
  Filled 2015-04-05: qty 1

## 2015-04-05 MED ORDER — IPRATROPIUM-ALBUTEROL 0.5-2.5 (3) MG/3ML IN SOLN: 3.0000 mL | RESPIRATORY_TRACT | Status: DC

## 2015-04-05 MED ORDER — POTASSIUM CHLORIDE CRYS ER 20 MEQ PO TBCR
20.0000 meq | EXTENDED_RELEASE_TABLET | Freq: Every day | ORAL | Status: DC
  Administered 2015-04-05 – 2015-04-06 (×2): 20 meq via ORAL
  Filled 2015-04-05 (×2): qty 1

## 2015-04-05 MED ORDER — ALBUTEROL SULFATE (2.5 MG/3ML) 0.083% IN NEBU: 2.5000 mg | INHALATION_SOLUTION | RESPIRATORY_TRACT | Status: DC | PRN

## 2015-04-05 MED ORDER — SODIUM CHLORIDE 0.9 % IV SOLN: 250.0000 mL | INTRAVENOUS | Status: DC | PRN

## 2015-04-05 MED ORDER — FUROSEMIDE 40 MG PO TABS
40.0000 mg | ORAL_TABLET | Freq: Every day | ORAL | Status: DC
  Administered 2015-04-05 – 2015-04-06 (×2): 40 mg via ORAL
  Filled 2015-04-05 (×2): qty 1

## 2015-04-05 MED ORDER — SODIUM CHLORIDE 0.9% FLUSH
3.0000 mL | Freq: Two times a day (BID) | INTRAVENOUS | Status: DC
  Administered 2015-04-05: 3 mL via INTRAVENOUS

## 2015-04-05 MED ORDER — ATORVASTATIN CALCIUM 40 MG PO TABS
40.0000 mg | ORAL_TABLET | Freq: Every day | ORAL | Status: DC
  Administered 2015-04-05: 40 mg via ORAL
  Filled 2015-04-05: qty 1

## 2015-04-05 MED ORDER — MAGNESIUM HYDROXIDE 400 MG/5ML PO SUSP: 30.0000 mL | Freq: Every day | ORAL | Status: DC | PRN

## 2015-04-05 MED ORDER — SODIUM CHLORIDE 0.9% FLUSH: 3.0000 mL | INTRAVENOUS | Status: DC | PRN

## 2015-04-05 MED ORDER — IPRATROPIUM-ALBUTEROL 0.5-2.5 (3) MG/3ML IN SOLN
3.0000 mL | Freq: Three times a day (TID) | RESPIRATORY_TRACT | Status: DC
  Administered 2015-04-05 – 2015-04-06 (×3): 3 mL via RESPIRATORY_TRACT
  Filled 2015-04-05 (×4): qty 3

## 2015-04-05 NOTE — Progress Notes (Signed)
UR Completed. Kiernan Farkas, RN, BSN.  [phone] 

## 2015-04-05 NOTE — Progress Notes (Signed)
Pt transferred to 2W39 via ambulation, HR up to low 120s ST, other vss, settled in bed, receiving RN at bedside. Sherrie Mustache

## 2015-04-05 NOTE — Care Management Important Message (Signed)
Important Message  Patient Details  Name: Tonya Walton MRN: [id number] Date of Birth: 1950-08-03   Medicare Important Message Given:  Yes    Nathen May 04/05/2015, 2:49 PM

## 2015-04-05 NOTE — Progress Notes (Addendum)
Report attempted to be called to [facility], no answer, will try again later. Kathleen Argue S 9:19 AM   2nd attempt made to call report, RN unable to take report at this time, a/w callback. 9:52 AM   Report called to RN on [facility]. 10:01 AM

## 2015-04-05 NOTE — Progress Notes (Signed)
Patient transferred from [facility] alert and oriented VSS

## 2015-04-05 NOTE — Progress Notes (Signed)
CARDIAC REHAB PHASE I   PRE:  Rate/Rhythm: 76 SR  BP:  Sitting: 112/49        SaO2: 97 RA  MODE:  Ambulation: 350 ft   POST:  Rate/Rhythm: 105 ST  BP:  Sitting: 145/62         SaO2: 100 RA  Pt has been ambulating independently with husband in hallways since transferring from 2S. Pt agreeable to walk again, very pleasant. Discussed and practiced sternal precautions with pt and husband. Pt ambulated 350 ft on RA, handheld assist, fairly steady gait, tolerated well, no complaints. Pt to bed per pt request after walk, call bell within reach. Encouraged ambulation, IS. Will follow.    1420-1500 Lenna Sciara, RN, BSN 04/05/2015 2:58 PM

## 2015-04-05 NOTE — Progress Notes (Signed)
4 Days Post-Op Procedure(s) (LRB): CORONARY ARTERY BYPASS GRAFTING (CABG) x one , using left mammary artery (N/A) TRANSESOPHAGEAL ECHOCARDIOGRAM (TEE) (N/A) Subjective: Doing well after CABG LIMA to Lad nsr On RA, ambulating in hall Incision clean and dry Expected postop blood loss anemia on po iron  Objective: Vital signs in last 24 hours: Temp:  [97.9 F (36.6 C)-99.1 F (37.3 C)] 98.5 F (36.9 C) (03/06 0732) Pulse Rate:  [74-104] 77 (03/06 0700) Cardiac Rhythm:  [-] Normal sinus rhythm (03/05 2000) Resp:  [16-28] 19 (03/06 0700) BP: (81-144)/(39-107) 105/63 mmHg (03/06 0700) SpO2:  [91 %-100 %] 98 % (03/06 0732) Weight:  [230 lb 13.2 oz (104.7 kg)] 230 lb 13.2 oz (104.7 kg) (03/06 0452)  Hemodynamic parameters for last 24 hours:  stable  Intake/Output from previous day: 03/05 0701 - 03/06 0700 In: 780 [P.O.:780] Out: 3020 [Urine:3020] Intake/Output this shift:        Exam    General- alert and comfortable   Lungs- clear without rales, wheezes   Cor- regular rate and rhythm, no murmur , gallop   Abdomen- soft, non-tender   Extremities - warm, non-tender, minimal edema   Neuro- oriented, appropriate, no focal weakness   Lab Results:  Recent Labs  04/04/15 0315 04/05/15 0358  WBC 9.6 8.6  HGB 7.4* 8.0*  HCT 24.1* 25.2*  PLT 173 221   BMET:  Recent Labs  04/04/15 0315 04/05/15 0358  NA 139 142  K 3.8 4.3  CL 101 106  CO2 29 25  GLUCOSE 109* 106*  BUN 11 11  CREATININE 0.72 0.68  CALCIUM 8.2* 8.3*    PT/INR: No results for input(s): LABPROT, INR in the last 72 hours. ABG    Component Value Date/Time   PHART 7.286* 04/01/2015 2019   HCO3 22.9 04/01/2015 2019   TCO2 26 04/02/2015 1608   ACIDBASEDEF 4.0* 04/01/2015 2019   O2SAT 64.6 04/04/2015 0330   CBG (last 3)   Recent Labs  04/04/15 0328 04/04/15 0825 04/04/15 1140  GLUCAP 100* 94 119*    Assessment/Plan: S/P Procedure(s) (LRB): CORONARY ARTERY BYPASS GRAFTING (CABG) x one ,  using left mammary artery (N/A) TRANSESOPHAGEAL ECHOCARDIOGRAM (TEE) (N/A) Cont po iron home on combivent MDI Transfer to 2west  LOS: 4 days    Tonya Walton 04/05/2015

## 2015-04-06 ENCOUNTER — Inpatient Hospital Stay (HOSPITAL_COMMUNITY): Payer: PPO

## 2015-04-06 DIAGNOSIS — J90 Pleural effusion, not elsewhere classified: Secondary | ICD-10-CM | POA: Diagnosis not present

## 2015-04-06 LAB — CBC
HCT: 25 % — ABNORMAL LOW (ref 36.0–46.0)
Hemoglobin: 7.5 g/dL — ABNORMAL LOW (ref 12.0–15.0)
MCH: 26.3 pg (ref 26.0–34.0)
MCHC: 30 g/dL (ref 30.0–36.0)
MCV: 87.7 fL (ref 78.0–100.0)
Platelets: 281 10*3/uL (ref 150–400)
RBC: 2.85 MIL/uL — ABNORMAL LOW (ref 3.87–5.11)
RDW: 16.2 % — ABNORMAL HIGH (ref 11.5–15.5)
WBC: 7.4 10*3/uL (ref 4.0–10.5)

## 2015-04-06 LAB — TYPE AND SCREEN
ABO/RH(D): O POS
Antibody Screen: NEGATIVE
Unit division: 0
Unit division: 0

## 2015-04-06 LAB — BASIC METABOLIC PANEL
Anion gap: 12 (ref 5–15)
BUN: 11 mg/dL (ref 6–20)
CO2: 27 mmol/L (ref 22–32)
Calcium: 8.8 mg/dL — ABNORMAL LOW (ref 8.9–10.3)
Chloride: 103 mmol/L (ref 101–111)
Creatinine, Ser: 0.7 mg/dL (ref 0.44–1.00)
GFR calc Af Amer: >60 mL/min (ref >60)
GFR calc non Af Amer: >60 mL/min (ref >60)
Glucose, Bld: 107 mg/dL — ABNORMAL HIGH (ref 65–99)
Potassium: 4 mmol/L (ref 3.5–5.1)
Sodium: 142 mmol/L (ref 135–145)

## 2015-04-06 MED ORDER — ASPIRIN 325 MG PO TBEC: 325.0000 mg | DELAYED_RELEASE_TABLET | Freq: Every day | ORAL | Status: DC

## 2015-04-06 MED ORDER — LISINOPRIL 5 MG PO TABS
5.0000 mg | ORAL_TABLET | Freq: Every day | ORAL | Status: DC
  Administered 2015-04-06: 5 mg via ORAL
  Filled 2015-04-06: qty 1

## 2015-04-06 MED ORDER — FE FUMARATE-B12-VIT C-FA-IFC PO CAPS: 1.0000 | ORAL_CAPSULE | Freq: Three times a day (TID) | ORAL | Status: DC

## 2015-04-06 MED ORDER — ATORVASTATIN CALCIUM 40 MG PO TABS
40.0000 mg | ORAL_TABLET | Freq: Every day | ORAL | Status: DC
Start: 2015-04-06 — End: 2020-06-18

## 2015-04-06 MED ORDER — POTASSIUM CHLORIDE CRYS ER 20 MEQ PO TBCR: 20.0000 meq | EXTENDED_RELEASE_TABLET | Freq: Every day | ORAL | Status: DC

## 2015-04-06 MED ORDER — TRAMADOL HCL 50 MG PO TABS: 50.0000 mg | ORAL_TABLET | ORAL | Status: DC | PRN

## 2015-04-06 MED ORDER — METOPROLOL TARTRATE 25 MG PO TABS: 12.5000 mg | ORAL_TABLET | Freq: Two times a day (BID) | ORAL | Status: DC

## 2015-04-06 MED ORDER — LISINOPRIL 5 MG PO TABS: 5.0000 mg | ORAL_TABLET | Freq: Every day | ORAL | Status: DC

## 2015-04-06 NOTE — Progress Notes (Addendum)
      [address]       ,[place] [address]             [phone]      5 Days Post-Op Procedure(s) (LRB): CORONARY ARTERY BYPASS GRAFTING (CABG) x one , using left mammary artery (N/A) TRANSESOPHAGEAL ECHOCARDIOGRAM (TEE) (N/A)   Subjective:  Tonya Walton has no complaints this morning.  She is hoping to go home today.  She is ambulating independently.  + BM  Objective: Vital signs in last 24 hours: Temp:  [98.5 F (36.9 C)] 98.5 F (36.9 C) (03/07 0538) Pulse Rate:  [81-101] 81 (03/07 0538) Cardiac Rhythm:  [-] Normal sinus rhythm (03/06 1900) Resp:  [17-21] 18 (03/07 0538) BP: (116-143)/(49-84) 132/84 mmHg (03/07 0538) SpO2:  [95 %-99 %] 98 % (03/07 0538) Weight:  [229 lb 4.8 oz (104.01 kg)] 229 lb 4.8 oz (104.01 kg) (03/07 0538)  Intake/Output from previous day: 03/06 0701 - 03/07 0700 In: 650 [P.O.:650] Out: -   General appearance: alert, cooperative and no distress Heart: regular rate and rhythm Lungs: clear to auscultation bilaterally Abdomen: soft, non-tender; bowel sounds normal; no masses,  no organomegaly Wound: clean and dry  Lab Results:  Recent Labs  04/05/15 0358 04/06/15 0247  WBC 8.6 7.4  HGB 8.0* 7.5*  HCT 25.2* 25.0*  PLT 221 281   BMET:  Recent Labs  04/05/15 0358 04/06/15 0247  NA 142 142  K 4.3 4.0  CL 106 103  CO2 25 27  GLUCOSE 106* 107*  BUN 11 11  CREATININE 0.68 0.70  CALCIUM 8.3* 8.8*    PT/INR: No results for input(s): LABPROT, INR in the last 72 hours. ABG    Component Value Date/Time   PHART 7.286* 04/01/2015 2019   HCO3 22.9 04/01/2015 2019   TCO2 26 04/02/2015 1608   ACIDBASEDEF 4.0* 04/01/2015 2019   O2SAT 64.6 04/04/2015 0330   CBG (last 3)   Recent Labs  04/04/15 0328 04/04/15 0825 04/04/15 1140  GLUCAP 100* 94 119*    Assessment/Plan: S/P Procedure(s) (LRB): CORONARY ARTERY BYPASS GRAFTING (CABG) x one , using left mammary artery (N/A) TRANSESOPHAGEAL ECHOCARDIOGRAM (TEE)  (N/A)  1. CV- NSR, BP is mildly elevated- will continue Lopressor, will add low dose ACE 2. Pulm- no acute issues, continue IS 3. Renal- creatinine is WNL, mildly hypervolemic, continue Lasix for now 4. Expected post operative blood loss anemia- Hgb at 7.5, continue Iron 5. Dispo- patient stable, will d/c EPW, plan to d/c home this afternoon   LOS: 5 days    BARRETT, ERIN 04/06/2015  patient examined and medical record reviewed,agree with above note. Tonya Walton 04/06/2015

## 2015-04-06 NOTE — Progress Notes (Signed)
CARDIAC REHAB PHASE I   PRE:  Rate/Rhythm: 91 SR    BP: sitting 152/63    SaO2: 100 RA  MODE:  Ambulation: 550 ft   POST:  Rate/Rhythm: 123 ST    BP: sitting 144/64    SaO2: 100 RA  Tolerated well except HR elevated. No c/o. Had already walked 650 ft this am independently. Ed completed. Will send referral to [place]   Story, ACSM 04/06/2015 9:45 AM

## 2015-04-06 NOTE — Progress Notes (Signed)
Reviewed discharge instructions with patient IV removed No issues at present. Awaiting wheel chair to be transported to the front entrance.  Jerene Pitch Contractor

## 2015-04-06 NOTE — Progress Notes (Addendum)
Pacing wires are out, sutures removed Vitals stable Patient on bedrest until 1200 Will continue to monitor  [employer] RN

## 2015-04-06 NOTE — Care Management Note (Signed)
Case Management Note  Patient Details  Name: ANTONAE PORTS MRN: [id number] Date of Birth: 15-Dec-1950  Subjective/Objective:        CABG x1            Action/Plan: NCM spoke to pt at bedside. States he husband, Rush Landmark is at home to assist with her care. States she has RW and Secondary school teacher at home. She can afford her medications at home. No other NCM needs identified.   Expected Discharge Date:  04/06/2015              Expected Discharge Plan:  Home/Self Care  In-House Referral:  NA  Discharge planning Services  CM Consult  Post Acute Care Choice:  NA Choice offered to:  NA  DME Arranged:  N/A DME Agency:  NA  HH Arranged:  NA HH Agency:  NA  Status of Service:  Completed, signed off  Medicare Important Message Given:  Yes Date Medicare IM Given:    Medicare IM give by:    Date Additional Medicare IM Given:    Additional Medicare Important Message give by:     If discussed at [place] of Stay Meetings, dates discussed:    Additional Comments:  Erenest Rasher, RN 04/06/2015, 3:30 PM

## 2015-04-12 DIAGNOSIS — R55 Syncope and collapse: Secondary | ICD-10-CM | POA: Diagnosis not present

## 2015-04-12 DIAGNOSIS — I25118 Atherosclerotic heart disease of native coronary artery with other forms of angina pectoris: Secondary | ICD-10-CM | POA: Diagnosis not present

## 2015-04-12 DIAGNOSIS — D510 Vitamin B12 deficiency anemia due to intrinsic factor deficiency: Secondary | ICD-10-CM | POA: Diagnosis not present

## 2015-04-12 DIAGNOSIS — D500 Iron deficiency anemia secondary to blood loss (chronic): Secondary | ICD-10-CM | POA: Diagnosis not present

## 2015-04-20 DIAGNOSIS — J432 Centrilobular emphysema: Secondary | ICD-10-CM | POA: Diagnosis not present

## 2015-04-20 DIAGNOSIS — I25810 Atherosclerosis of coronary artery bypass graft(s) without angina pectoris: Secondary | ICD-10-CM | POA: Diagnosis not present

## 2015-04-20 DIAGNOSIS — I10 Essential (primary) hypertension: Secondary | ICD-10-CM | POA: Diagnosis not present

## 2015-04-20 DIAGNOSIS — I25118 Atherosclerotic heart disease of native coronary artery with other forms of angina pectoris: Secondary | ICD-10-CM | POA: Diagnosis not present

## 2015-04-20 DIAGNOSIS — I2510 Atherosclerotic heart disease of native coronary artery without angina pectoris: Secondary | ICD-10-CM | POA: Diagnosis not present

## 2015-04-20 DIAGNOSIS — I480 Paroxysmal atrial fibrillation: Secondary | ICD-10-CM | POA: Diagnosis not present

## 2015-04-20 DIAGNOSIS — R05 Cough: Secondary | ICD-10-CM | POA: Diagnosis not present

## 2015-04-20 DIAGNOSIS — J4522 Mild intermittent asthma with status asthmaticus: Secondary | ICD-10-CM | POA: Diagnosis not present

## 2015-04-20 DIAGNOSIS — J40 Bronchitis, not specified as acute or chronic: Secondary | ICD-10-CM | POA: Diagnosis not present

## 2015-04-20 DIAGNOSIS — E782 Mixed hyperlipidemia: Secondary | ICD-10-CM | POA: Diagnosis not present

## 2015-04-20 DIAGNOSIS — R509 Fever, unspecified: Secondary | ICD-10-CM | POA: Diagnosis not present

## 2015-04-29 DIAGNOSIS — J40 Bronchitis, not specified as acute or chronic: Secondary | ICD-10-CM | POA: Diagnosis not present

## 2015-04-29 DIAGNOSIS — J4522 Mild intermittent asthma with status asthmaticus: Secondary | ICD-10-CM | POA: Diagnosis not present

## 2015-05-04 ENCOUNTER — Other Ambulatory Visit: Payer: Self-pay | Admitting: Cardiothoracic Surgery

## 2015-05-04 DIAGNOSIS — Z951 Presence of aortocoronary bypass graft: Secondary | ICD-10-CM

## 2015-05-04 DIAGNOSIS — I25118 Atherosclerotic heart disease of native coronary artery with other forms of angina pectoris: Secondary | ICD-10-CM | POA: Diagnosis not present

## 2015-05-04 DIAGNOSIS — D649 Anemia, unspecified: Secondary | ICD-10-CM | POA: Diagnosis not present

## 2015-05-05 ENCOUNTER — Encounter: Payer: Self-pay | Admitting: Cardiothoracic Surgery

## 2015-05-05 ENCOUNTER — Ambulatory Visit
Admission: RE | Admit: 2015-05-05 | Discharge: 2015-05-05 | Disposition: A | Discharge status: Home / Self Care | Payer: PPO | Source: Ambulatory Visit | Attending: Cardiothoracic Surgery | Admitting: Cardiothoracic Surgery

## 2015-05-05 ENCOUNTER — Ambulatory Visit (INDEPENDENT_AMBULATORY_CARE_PROVIDER_SITE_OTHER): Payer: Self-pay | Admitting: Cardiothoracic Surgery

## 2015-05-05 VITALS — BP 112/77 | HR 86 | Resp 20 | Ht 66.0 in | Wt 222.0 lb

## 2015-05-05 DIAGNOSIS — I480 Paroxysmal atrial fibrillation: Secondary | ICD-10-CM

## 2015-05-05 DIAGNOSIS — Z951 Presence of aortocoronary bypass graft: Secondary | ICD-10-CM

## 2015-05-05 DIAGNOSIS — R05 Cough: Secondary | ICD-10-CM | POA: Diagnosis not present

## 2015-05-05 DIAGNOSIS — I25110 Atherosclerotic heart disease of native coronary artery with unstable angina pectoris: Secondary | ICD-10-CM

## 2015-05-05 NOTE — Progress Notes (Signed)
PCP is Rusty Aus, MD Referring Provider is Ubaldo Glassing Javier Docker, MD  Chief Complaint  Patient presents with  . Routine Post Op    f/u from surgery with CXR s/p CABG    HPI: One month followup after urgent CABG for ostial LAD stenosis and unstable angina. Left IMA graft to LAD Patient has done well after surgery without recurrent angina, the sternotomy is healing well. The patient has had some dizziness which improved after her lisinopril was discontinued. She is recovering from bronchitis. She is walking 30 minutes daily without difficulty and has decided to follow a home based cardiac rehabilitation program.  Her chest x-ray today is clear, sternal wires intact and well aligned. Overall she's done very well and I feel she is approximately 50-60% recovered.we discussed increasing her activity levels to include driving, lifting up to 15 pounds maximum,. Later this month she can use a rider lawnmower for limited periods.  Past Medical History  Diagnosis Date  . Anxiety   . Depression   . Dysrhythmia     Afib  . CHF (congestive heart failure) ([place])   . Hypertension   . COPD (chronic obstructive pulmonary disease) ([place])   . Hypothyroidism   . GERD (gastroesophageal reflux disease)   . Headache   . Neuromuscular disorder ([place])     nerve damage in legs  . Arthritis   . Cancer [facility])     right breast  . Atrial fibrillation, controlled ([place])   . Shortness of breath dyspnea   . Restless leg syndrome   . Coronary artery disease     Past Surgical History  Procedure Laterality Date  . Breast lumpectomy      Right breast  . Appendectomy    . Tonsillectomy    . Total knee arthroplasty Right 06/10/2014    Procedure: TOTAL KNEE ARTHROPLASTY;  Surgeon: Dereck Leep, MD;  Location: [facility] ORS;  Service: Orthopedics;  Laterality: Right;  . Joint replacement Right     Total Knee Replacement  . Total knee arthroplasty Left 09/02/2014    Procedure: TOTAL KNEE ARTHROPLASTY;  Surgeon: Dereck Leep, MD;  Location: [facility] ORS;  Service: Orthopedics;  Laterality: Left;  . Cataract extraction w/phaco Right 10/14/2014    Procedure: CATARACT EXTRACTION PHACO AND INTRAOCULAR LENS PLACEMENT (IOC);  Surgeon: Leandrew Koyanagi, MD;  Location: [place];  Service: Ophthalmology;  Laterality: Right;  . Breast excisional biopsy Right 2012    +  . Breast biopsy Left 07/2013    -  . Cardiac catheterization N/A 03/02/2015    Procedure: Left Heart Cath and Coronary Angiography;  Surgeon: Teodoro Spray, MD;  Location: [place] CV LAB;  Service: Cardiovascular;  Laterality: N/A;  . Coronary artery bypass graft N/A 04/01/2015    Procedure: CORONARY ARTERY BYPASS GRAFTING (CABG) x one , using left mammary artery;  Surgeon: Ivin Poot, MD;  Location: [place];  Service: Open Heart Surgery;  Laterality: N/A;  . Tee without cardioversion N/A 04/01/2015    Procedure: TRANSESOPHAGEAL ECHOCARDIOGRAM (TEE);  Surgeon: Ivin Poot, MD;  Location: [place];  Service: Open Heart Surgery;  Laterality: N/A;    Family History  Problem Relation Age of Onset  . Leukemia Mother   . Bone cancer Father   . Heart disease Brother   . Heart disease Brother   . Diabetes Brother   . Hypertension Brother   . Diabetes Brother   . Hypertension Brother   . Heart disease Brother  Social History Social History  Substance Use Topics  . Smoking status: Former Smoker -- 1.00 packs/day    Types: Cigarettes    Quit date: 04/30/1992  . Smokeless tobacco: Never Used  . Alcohol Use: No    Current Outpatient Prescriptions  Medication Sig Dispense Refill  . anastrozole (ARIMIDEX) 1 MG tablet Take 1 mg by mouth daily.     . Artificial Tear Ointment (DRY EYES OP) Place 1 drop into both eyes at bedtime.    Marland Kitchen aspirin EC 325 MG EC tablet Take 1 tablet (325 mg total) by mouth daily. 30 tablet 0  . atorvastatin (LIPITOR) 40 MG tablet Take 1 tablet (40 mg total) by mouth daily at 6 PM. 30 tablet 3  . clonazePAM  (KLONOPIN) 0.5 MG tablet Take 2 tablets (1 mg total) by mouth at bedtime. 30 tablet 1  . Cyanocobalamin (B-12 COMPLIANCE INJECTION) 1000 MCG/ML KIT Inject 1,000 mcg as directed every Monday, Wednesday, and Friday.     . Cyanocobalamin ([employer] B-12) 1000 MCG/15ML LIQD Take 1,000 mcg by mouth every Monday, Wednesday, and Friday. Rotates weekly with the injection    . cyclobenzaprine (FLEXERIL) 10 MG tablet Take 10 mg by mouth daily.     . ferrous [id number] C-folic acid (TRINSICON / FOLTRIN) capsule Take 1 capsule by mouth 3 (three) times daily after meals. 90 capsule 3  . FLUoxetine (PROZAC) 40 MG capsule Take 1 capsule (40 mg total) by mouth daily. 90 capsule 1  . furosemide (LASIX) 40 MG tablet Take 40 mg by mouth daily as needed for fluid.     Marland Kitchen gabapentin (NEURONTIN) 300 MG capsule Take 1,500 mg by mouth 2 (two) times daily. 1500 mg AM, 1500 mg PM    . levothyroxine (SYNTHROID, LEVOTHROID) 25 MCG tablet Take 25 mcg by mouth daily.     Marland Kitchen lisinopril (PRINIVIL,ZESTRIL) 5 MG tablet Take 1 tablet (5 mg total) by mouth daily. 30 tablet 3  . metoprolol tartrate (LOPRESSOR) 25 MG tablet Take 0.5 tablets (12.5 mg total) by mouth 2 (two) times daily. 60 tablet 3  . montelukast (SINGULAIR) 10 MG tablet Take 10 mg by mouth at bedtime.    Marland Kitchen oxybutynin (DITROPAN) 5 MG tablet Take 5 mg by mouth 3 (three) times daily as needed for bladder spasms.    . pantoprazole (PROTONIX) 40 MG tablet Take 40 mg by mouth daily. AM    . potassium chloride SA (K-DUR,KLOR-CON) 20 MEQ tablet Take 1 tablet (20 mEq total) by mouth daily. If taking Lasix 30 tablet 3  . pramipexole (MIRAPEX) 0.25 MG tablet Take 0.25-0.5 mg by mouth 3 (three) times daily. Pt takes two tablets in the morning, one tablet at noon, and two tablets at bedtime.    . predniSONE (DELTASONE) 10 MG tablet Take 10 mg by mouth daily.  0  . traMADol (ULTRAM) 50 MG tablet Take 1-2 tablets (50-100 mg total) by mouth every 4 (four) hours as needed for moderate  pain. 30 tablet 0  . zolpidem (AMBIEN) 5 MG tablet Take 1 tablet (5 mg total) by mouth at bedtime. 90 tablet 1  . doxycycline (VIBRA-TABS) 100 MG tablet Take 100 mg by mouth 2 (two) times daily with a meal.  0   No current facility-administered medications for this visit.    No Known Allergies  Review of Systems  No recurrent angina Surgical incision well-healed No peripheral edema No orthopnea Dizziness has resolved after stopping lisinopril 5 mg daily  BP 112/77 mmHg  Pulse 86  Resp 20  Ht 5' 6"  (1.676 m)  Wt 222 lb (100.699 kg)  BMI 35.85 kg/m2  SpO2 99% Physical Exam Alert and comfortable Lungs clear Heart rate regular without murmur or gallop Sternal incision stable well-healed Neuro intact Extremities warm without pedal edema  Diagnostic Tests: Chest x-ray performed today personally reviewed and is clear  Impression: Excellent early recovery after CABG for ostial left main stenosis  Plan: She will return as needed. She understands the importance of heart healthy diet and daily exercise.she will continue her aspirin-Lipitor indefinitely.  Len Childs, MD [facility] [phone]

## 2015-05-18 DIAGNOSIS — I25118 Atherosclerotic heart disease of native coronary artery with other forms of angina pectoris: Secondary | ICD-10-CM | POA: Diagnosis not present

## 2015-05-18 DIAGNOSIS — I10 Essential (primary) hypertension: Secondary | ICD-10-CM | POA: Diagnosis not present

## 2015-05-18 DIAGNOSIS — I480 Paroxysmal atrial fibrillation: Secondary | ICD-10-CM | POA: Diagnosis not present

## 2015-05-18 DIAGNOSIS — I2510 Atherosclerotic heart disease of native coronary artery without angina pectoris: Secondary | ICD-10-CM | POA: Diagnosis not present

## 2015-05-18 DIAGNOSIS — E782 Mixed hyperlipidemia: Secondary | ICD-10-CM | POA: Diagnosis not present

## 2015-06-08 ENCOUNTER — Encounter: Payer: Self-pay | Admitting: Psychiatry

## 2015-06-08 ENCOUNTER — Ambulatory Visit (INDEPENDENT_AMBULATORY_CARE_PROVIDER_SITE_OTHER): Payer: PPO | Admitting: Psychiatry

## 2015-06-08 VITALS — BP 120/68 | HR 91 | Temp 97.4°F | Ht 66.0 in | Wt 224.0 lb

## 2015-06-08 DIAGNOSIS — F331 Major depressive disorder, recurrent, moderate: Secondary | ICD-10-CM

## 2015-06-08 MED ORDER — FLUOXETINE HCL 40 MG PO CAPS: 40.0000 mg | ORAL_CAPSULE | Freq: Every day | ORAL | Status: DC

## 2015-06-08 MED ORDER — ZOLPIDEM TARTRATE 5 MG PO TABS: 5.0000 mg | ORAL_TABLET | Freq: Every day | ORAL | Status: DC

## 2015-06-08 MED ORDER — FLUOXETINE HCL 20 MG PO TABS: 20.0000 mg | ORAL_TABLET | Freq: Every day | ORAL | Status: DC

## 2015-06-08 MED ORDER — CLONAZEPAM 0.5 MG PO TABS: 0.5000 mg | ORAL_TABLET | Freq: Every day | ORAL | Status: DC

## 2015-06-08 NOTE — Progress Notes (Signed)
BH MD/PA/NP OP Progress Note  06/08/2015 9:25 AM Tonya Walton  MRN:  [id number]  Subjective:  Pt is a 65 year old married female who presented for the follow-up. She  reported that he had her bypass surgery in March. She appeared calm and reported that she discovered successfully from her surgery and is doing well. She did some physical therapy and has been exercising at her home with her family. She reported that she wants to have her medications adjusted at this time. She is not taking any pain medications. Patient reported that she has support of her family members. She is sleeping well with the help of Klonopin and Ambien and is not using any other pills at this time. We discussed at length about her medications. She currently denied worsening of her depressive symptoms. She denied having any suicidal homicidal ideations or plans.    Patient currently denied having any suicidal homicidal ideations or plans.   Chief Complaint:  Chief Complaint    Follow-up; Medication Refill     Visit Diagnosis:     ICD-9-CM ICD-10-CM   1. MDD (major depressive disorder), recurrent episode, moderate ([place]) 296.32 F33.1     Past Medical History:  Past Medical History  Diagnosis Date  . Anxiety   . Depression   . Dysrhythmia     Afib  . CHF (congestive heart failure) ([place])   . Hypertension   . COPD (chronic obstructive pulmonary disease) ([place])   . Hypothyroidism   . GERD (gastroesophageal reflux disease)   . Headache   . Neuromuscular disorder ([place])     nerve damage in legs  . Arthritis   . Cancer [facility])     right breast  . Atrial fibrillation, controlled ([place])   . Shortness of breath dyspnea   . Restless leg syndrome   . Coronary artery disease     Past Surgical History  Procedure Laterality Date  . Breast lumpectomy      Right breast  . Appendectomy    . Tonsillectomy    . Total knee arthroplasty Right 06/10/2014    Procedure: TOTAL KNEE ARTHROPLASTY;  Surgeon: Dereck Leep, MD;   Location: [facility] OR[facility];  Service: Orthopedics;  Laterality: Right;  . Joint replacement Right     Total Knee Replacement  . Total knee arthroplasty Left 09/02/2014    Procedure: TOTAL KNEE ARTHROPLASTY;  Surgeon: Dereck Leep, MD;  Location: [facility] OR[facility];  Service: Orthopedics;  Laterality: Left;  . Cataract extraction w/phaco Right 10/14/2014    Procedure: CATARACT EXTRACTION PHACO AND INTRAOCULAR LENS PLACEMENT (IOC);  Surgeon: Leandrew Koyanagi, MD;  Location: [place];  Service: Ophthalmology;  Laterality: Right;  . Breast excisional biopsy Right 2012    +  . Breast biopsy Left 07/2013    -  . Cardiac catheterization N/A 03/02/2015    Procedure: Left Heart Cath and Coronary Angiography;  Surgeon: Teodoro Spray, MD;  Location: [place] CV LAB;  Service: Cardiovascular;  Laterality: N/A;  . Coronary artery bypass graft N/A 04/01/2015    Procedure: CORONARY ARTERY BYPASS GRAFTING (CABG) x one , using left mammary artery;  Surgeon: Ivin Poot, MD;  Location: [place];  Service: Open Heart Surgery;  Laterality: N/A;  . Tee without cardioversion N/A 04/01/2015    Procedure: TRANSESOPHAGEAL ECHOCARDIOGRAM (TEE);  Surgeon: Ivin Poot, MD;  Location: [place];  Service: Open Heart Surgery;  Laterality: N/A;   Family History:  Family History  Problem Relation Age of Onset  .  Leukemia Mother   . Bone cancer Father   . Heart disease Brother   . Heart disease Brother   . Diabetes Brother   . Hypertension Brother   . Diabetes Brother   . Hypertension Brother   . Heart disease Brother    Social History:  Social History   Social History  . Marital Status: Married    Spouse Name: N/A  . Number of Children: N/A  . Years of Education: N/A   Social History Main Topics  . Smoking status: Former Smoker -- 1.00 packs/day    Types: Cigarettes    Quit date: 04/30/1992  . Smokeless tobacco: Never Used  . Alcohol Use: No  . Drug Use: No  . Sexual Activity: No   Other Topics Concern   . None   Social History Narrative   Additional History:  She currently lives with her husband who is very helpful and has been taking care of her during her surgery. She reported that he is a good  nurse  Assessment:   Musculoskeletal: Strength & Muscle Tone: within normal limits Gait & Station: normal Patient leans: N/A  Psychiatric Specialty Exam: Depression        Associated symptoms include does not have insomnia and no suicidal ideas.   Review of Systems  Constitutional: Negative for chills.  HENT: Negative for congestion and tinnitus.   Eyes: Negative for double vision.  Respiratory: Negative for hemoptysis.   Cardiovascular: Positive for chest pain. Negative for palpitations.  Gastrointestinal: Positive for diarrhea. Negative for nausea.  Genitourinary: Negative for urgency.  Musculoskeletal: Positive for joint pain. Negative for back pain.  Skin: Negative for rash.  Neurological: Negative for sensory change.  Endo/Heme/Allergies: Negative for environmental allergies.  Psychiatric/Behavioral: Positive for depression. Negative for suicidal ideas and substance abuse. The patient does not have insomnia.     Blood pressure 120/68, pulse 91, temperature 97.4 F (36.3 C), temperature source Tympanic, height _0  (1.676 m), weight 224 lb (101.606 kg), SpO2 90 %.Body mass index is 36.17 kg/(m^2).  General Appearance: Casual  Eye Contact:  Fair  Speech:  Normal Rate  Volume:  Normal  Mood:  Euthymic  Affect:  Appropriate  Thought Process:  Coherent  Orientation:  Full (Time, Place, and Person)  Thought Content:  WDL  Suicidal Thoughts:  No  Homicidal Thoughts:  No  Memory:  Immediate;   Fair  Judgement:  Fair  Insight:  Fair  Psychomotor Activity:  Normal  Concentration:  Fair  Recall:  [employer] of Knowledge: Fair  Language: Fair  Akathisia:  No  Handed:  Right  AIMS (if indicated):  none  Assets:  Communication Skills Desire for Improvement Social Support   ADL's:  Intact  Cognition: WNL  Sleep:  8-10   Is the patient at risk to self?  No. Has the patient been a risk to self in the past 6 months?  No. Has the patient been a risk to self within the distant past?  No. Is the patient a risk to others?  No. Has the patient been a risk to others in the past 6 months?  No. Has the patient been a risk to others within the distant past?  No.  Current Medications: Current Outpatient Prescriptions  Medication Sig Dispense Refill  . anastrozole (ARIMIDEX) 1 MG tablet Take 1 mg by mouth daily.     . Artificial Tear Ointment (DRY EYES OP) Place 1 drop into both eyes at bedtime.    Marland Kitchen  aspirin EC 325 MG EC tablet Take 1 tablet (325 mg total) by mouth daily. 30 tablet 0  . atorvastatin (LIPITOR) 40 MG tablet Take 1 tablet (40 mg total) by mouth daily at 6 PM. 30 tablet 3  . clonazePAM (KLONOPIN) 0.5 MG tablet Take 2 tablets (1 mg total) by mouth at bedtime. 30 tablet 1  . Cyanocobalamin (B-12 COMPLIANCE INJECTION) 1000 MCG/ML KIT Inject 1,000 mcg as directed every Monday, Wednesday, and Friday.     . Cyanocobalamin (CVS B-12) 1000 MCG/15ML LIQD Take 1,000 mcg by mouth every Monday, Wednesday, and Friday. Rotates weekly with the injection    . cyclobenzaprine (FLEXERIL) 10 MG tablet Take 10 mg by mouth daily.     Marland Kitchen doxycycline (VIBRA-TABS) 100 MG tablet Take 100 mg by mouth 2 (two) times daily with a meal.  0  . ferrous [id number] C-folic acid (TRINSICON / FOLTRIN) capsule Take 1 capsule by mouth 3 (three) times daily after meals. 90 capsule 3  . FLUoxetine (PROZAC) 40 MG capsule Take 1 capsule (40 mg total) by mouth daily. 90 capsule 1  . furosemide (LASIX) 40 MG tablet Take 40 mg by mouth daily as needed for fluid.     Marland Kitchen gabapentin (NEURONTIN) 300 MG capsule Take 1,500 mg by mouth 2 (two) times daily. 1500 mg AM, 1500 mg PM    . levothyroxine (SYNTHROID, LEVOTHROID) 25 MCG tablet Take 25 mcg by mouth daily.     Marland Kitchen lisinopril (PRINIVIL,ZESTRIL) 5  MG tablet Take 1 tablet (5 mg total) by mouth daily. 30 tablet 3  . metoprolol tartrate (LOPRESSOR) 25 MG tablet Take 0.5 tablets (12.5 mg total) by mouth 2 (two) times daily. 60 tablet 3  . montelukast (SINGULAIR) 10 MG tablet Take 10 mg by mouth at bedtime.    Marland Kitchen oxybutynin (DITROPAN) 5 MG tablet Take 5 mg by mouth 3 (three) times daily as needed for bladder spasms.    . pantoprazole (PROTONIX) 40 MG tablet Take 40 mg by mouth daily. AM    . potassium chloride SA (K-DUR,KLOR-CON) 20 MEQ tablet Take 1 tablet (20 mEq total) by mouth daily. If taking Lasix 30 tablet 3  . pramipexole (MIRAPEX) 0.25 MG tablet Take 0.25-0.5 mg by mouth 3 (three) times daily. Pt takes two tablets in the morning, one tablet at noon, and two tablets at bedtime.    . predniSONE (DELTASONE) 10 MG tablet Take 10 mg by mouth daily.  0  . traMADol (ULTRAM) 50 MG tablet Take 1-2 tablets (50-100 mg total) by mouth every 4 (four) hours as needed for moderate pain. 30 tablet 0  . zolpidem (AMBIEN) 5 MG tablet Take 1 tablet (5 mg total) by mouth at bedtime. 90 tablet 1   No current facility-administered medications for this visit.    Medical Decision Making:  Established Problem, Stable/Improving (1) and Review of Last Therapy Session (1)  Treatment Plan Summary:Medication management  Depression Continue on Prozac 40 mg in the morning- 90 day supply given  He was also given Prozac 20 mg in the morning as she feels that she has increase in her   hypomanic symptoms  Anxiety Advised patient to start taking Klonopin 0.5 mg at bed.   Sleeping problems  Patient will continue on zolpidem 5 mg at bedtime and she was given 90 day supply of the medication  Follow-up She will follow-up in 1 months or earlier      More than 50% of the time spent in psychoeducation, counseling and coordination of  care.  Time spent with the patient 25 minutes   This note was generated in part or whole with voice recognition software. Voice  regonition is usually quite accurate but there are transcription errors that can and very often do occur. I apologize for any typographical errors that were not detected and corrected.    Rainey Pines, MD  06/08/2015, 9:25 AM

## 2015-06-15 DIAGNOSIS — R0602 Shortness of breath: Secondary | ICD-10-CM | POA: Diagnosis not present

## 2015-06-15 DIAGNOSIS — D649 Anemia, unspecified: Secondary | ICD-10-CM | POA: Diagnosis not present

## 2015-06-15 DIAGNOSIS — I25118 Atherosclerotic heart disease of native coronary artery with other forms of angina pectoris: Secondary | ICD-10-CM | POA: Diagnosis not present

## 2015-06-15 DIAGNOSIS — Z79899 Other long term (current) drug therapy: Secondary | ICD-10-CM | POA: Diagnosis not present

## 2015-06-24 DIAGNOSIS — H2512 Age-related nuclear cataract, left eye: Secondary | ICD-10-CM | POA: Diagnosis not present

## 2015-07-09 ENCOUNTER — Encounter: Payer: Self-pay | Admitting: Psychiatry

## 2015-07-09 ENCOUNTER — Ambulatory Visit (INDEPENDENT_AMBULATORY_CARE_PROVIDER_SITE_OTHER): Payer: PPO | Admitting: Psychiatry

## 2015-07-09 VITALS — BP 119/74 | HR 70 | Temp 98.1°F | Ht 66.0 in | Wt 223.4 lb

## 2015-07-09 DIAGNOSIS — F331 Major depressive disorder, recurrent, moderate: Secondary | ICD-10-CM | POA: Diagnosis not present

## 2015-07-09 MED ORDER — LAMOTRIGINE 25 MG PO TABS: 25.0000 mg | ORAL_TABLET | Freq: Every day | ORAL | Status: DC

## 2015-07-09 MED ORDER — CLONAZEPAM 0.5 MG PO TABS: 0.5000 mg | ORAL_TABLET | Freq: Every day | ORAL | Status: DC

## 2015-07-09 NOTE — Progress Notes (Signed)
BH MD/PA/NP OP Progress Note  07/09/2015 10:24 AM Tonya Walton  MRN:  [id number]  Subjective:  Pt is a 65 year old married female who presented for the follow-up. She  reported that she did not do well after we changed her medications at the last appointment. She reported that she became more snappy and agitated with decreasing the dose of her Prozac. She wants to have her medications adjusted. Patient reported that she loses temper quickly with her husband. Patient reported that she had several stressors in the past 2 years. She is recuperating from her bypass surgery in March. We discussed at length about her medications. She is also not sleeping well with the decreased dose of Klonopin  but we discussed that she is also on Ambien and gabapentin and she agreed with the plan. Patient currently denied having any perceptual disturbances. She appeared well groomed and cooperative during the interview. She reported that she likes cooking and supporting her friends and family. She enjoys talking during the interview as well.  Patient currently denied having any suicidal homicidal ideations or plans.   Chief Complaint:  Chief Complaint    Medication Refill     Visit Diagnosis:     ICD-9-CM ICD-10-CM   1. MDD (major depressive disorder), recurrent episode, moderate ([place]) 296.32 F33.1     Past Medical History:  Past Medical History  Diagnosis Date  . Anxiety   . Depression   . Dysrhythmia     Afib  . CHF (congestive heart failure) ([place])   . Hypertension   . COPD (chronic obstructive pulmonary disease) ([place])   . Hypothyroidism   . GERD (gastroesophageal reflux disease)   . Headache   . Neuromuscular disorder ([place])     nerve damage in legs  . Arthritis   . Cancer [facility])     right breast  . Atrial fibrillation, controlled ([place])   . Shortness of breath dyspnea   . Restless leg syndrome   . Coronary artery disease     Past Surgical History  Procedure Laterality Date  . Breast  lumpectomy      Right breast  . Appendectomy    . Tonsillectomy    . Total knee arthroplasty Right 06/10/2014    Procedure: TOTAL KNEE ARTHROPLASTY;  Surgeon: Dereck Leep, MD;  Location: [facility];  Service: Orthopedics;  Laterality: Right;  . Joint replacement Right     Total Knee Replacement  . Total knee arthroplasty Left 09/02/2014    Procedure: TOTAL KNEE ARTHROPLASTY;  Surgeon: Dereck Leep, MD;  Location: [facility] OR[facility];  Service: Orthopedics;  Laterality: Left;  . Cataract extraction w/phaco Right 10/14/2014    Procedure: CATARACT EXTRACTION PHACO AND INTRAOCULAR LENS PLACEMENT (IOC);  Surgeon: Leandrew Koyanagi, MD;  Location: [place];  Service: Ophthalmology;  Laterality: Right;  . Breast excisional biopsy Right 2012    +  . Breast biopsy Left 07/2013    -  . Cardiac catheterization N/A 03/02/2015    Procedure: Left Heart Cath and Coronary Angiography;  Surgeon: Teodoro Spray, MD;  Location: [place] [facility];  Service: Cardiovascular;  Laterality: N/A;  . Coronary artery bypass graft N/A 04/01/2015    Procedure: CORONARY ARTERY BYPASS GRAFTING (CABG) x one , using left mammary artery;  Surgeon: Ivin Poot, MD;  Location: [place];  Service: Open Heart Surgery;  Laterality: N/A;  . Tee without cardioversion N/A 04/01/2015    Procedure: TRANSESOPHAGEAL ECHOCARDIOGRAM (TEE);  Surgeon: Ivin Poot, MD;  Location:  [place] OR;  Service: Open Heart Surgery;  Laterality: N/A;   Family History:  Family History  Problem Relation Age of Onset  . Leukemia Mother   . Bone cancer Father   . Heart disease Brother   . Heart disease Brother   . Diabetes Brother   . Hypertension Brother   . Diabetes Brother   . Hypertension Brother   . Heart disease Brother    Social History:  Social History   Social History  . Marital Status: Married    Spouse Name: N/A  . Number of Children: N/A  . Years of Education: N/A   Social History Main Topics  . Smoking status: Former Smoker --  1.00 packs/day    Types: Cigarettes    Quit date: 04/30/1992  . Smokeless tobacco: Never Used  . Alcohol Use: No  . Drug Use: No  . Sexual Activity: No   Other Topics Concern  . None   Social History Narrative   Additional History:  She currently lives with her husband who is very helpful and has been taking care of her during her surgery. She reported that he is a good  nurse  Assessment:   Musculoskeletal: Strength & Muscle Tone: within normal limits Gait & Station: normal Patient leans: N/A  Psychiatric Specialty Exam: Depression        Associated symptoms include does not have insomnia and no suicidal ideas.   Review of Systems  Constitutional: Negative for chills.  HENT: Negative for congestion and tinnitus.   Eyes: Negative for double vision.  Respiratory: Negative for hemoptysis.   Cardiovascular: Positive for chest pain. Negative for palpitations.  Gastrointestinal: Positive for diarrhea. Negative for nausea.  Genitourinary: Negative for urgency.  Musculoskeletal: Positive for joint pain. Negative for back pain.  Skin: Negative for rash.  Neurological: Negative for sensory change.  Endo/Heme/Allergies: Negative for environmental allergies.  Psychiatric/Behavioral: Positive for depression. Negative for suicidal ideas and substance abuse. The patient does not have insomnia.     Blood pressure 119/74, pulse 70, temperature 98.1 F (36.7 C), height _0  (1.676 m), weight 223 lb 6.4 oz (101.334 kg), SpO2 98 %.Body mass index is 36.08 kg/(m^2).  General Appearance: Casual  Eye Contact:  Fair  Speech:  Normal Rate  Volume:  Normal  Mood:  Euthymic  Affect:  Appropriate  Thought Process:  Coherent  Orientation:  Full (Time, Place, and Person)  Thought Content:  WDL  Suicidal Thoughts:  No  Homicidal Thoughts:  No  Memory:  Immediate;   Fair  Judgement:  Fair  Insight:  Fair  Psychomotor Activity:  Normal  Concentration:  Fair  Recall:  [employer] of  Knowledge: Fair  Language: Fair  Akathisia:  No  Handed:  Right  AIMS (if indicated):  none  Assets:  Communication Skills Desire for Improvement Social Support  ADL's:  Intact  Cognition: WNL  Sleep:  8-10   Is the patient at risk to self?  No. Has the patient been a risk to self in the past 6 months?  No. Has the patient been a risk to self within the distant past?  No. Is the patient a risk to others?  No. Has the patient been a risk to others in the past 6 months?  No. Has the patient been a risk to others within the distant past?  No.  Current Medications: Current Outpatient Prescriptions  Medication Sig Dispense Refill  . anastrozole (ARIMIDEX) 1 MG tablet Take 1 mg by  mouth daily.     . Artificial Tear Ointment (DRY EYES OP) Place 1 drop into both eyes at bedtime.    Marland Kitchen aspirin EC 325 MG EC tablet Take 1 tablet (325 mg total) by mouth daily. 30 tablet 0  . atorvastatin (LIPITOR) 40 MG tablet Take 1 tablet (40 mg total) by mouth daily at 6 PM. 30 tablet 3  . clonazePAM (KLONOPIN) 0.5 MG tablet Take 1 tablet (0.5 mg total) by mouth at bedtime. 30 tablet 1  . Cyanocobalamin (B-12 COMPLIANCE INJECTION) 1000 MCG/ML KIT Inject 1,000 mcg as directed every Monday, Wednesday, and Friday.     . Cyanocobalamin ([employer] B-12) 1000 MCG/15ML LIQD Take 1,000 mcg by mouth every Monday, Wednesday, and Friday. Rotates weekly with the injection    . cyclobenzaprine (FLEXERIL) 10 MG tablet Take 10 mg by mouth daily.     Marland Kitchen doxycycline (VIBRA-TABS) 100 MG tablet Take 100 mg by mouth 2 (two) times daily with a meal.  0  . ferrous [id number] C-folic acid (TRINSICON / FOLTRIN) capsule Take 1 capsule by mouth 3 (three) times daily after meals. 90 capsule 3  . FLUoxetine (PROZAC) 20 MG tablet Take 1 tablet (20 mg total) by mouth daily. 30 tablet 0  . FLUoxetine (PROZAC) 40 MG capsule Take 1 capsule (40 mg total) by mouth daily. 90 capsule 1  . furosemide (LASIX) 40 MG tablet Take 40 mg by mouth  daily as needed for fluid.     Marland Kitchen gabapentin (NEURONTIN) 300 MG capsule Take 1,500 mg by mouth 2 (two) times daily. 1500 mg AM, 1500 mg PM    . levothyroxine (SYNTHROID, LEVOTHROID) 25 MCG tablet Take 25 mcg by mouth daily.     Marland Kitchen lisinopril (PRINIVIL,ZESTRIL) 5 MG tablet Take 1 tablet (5 mg total) by mouth daily. 30 tablet 3  . metoprolol tartrate (LOPRESSOR) 25 MG tablet Take 0.5 tablets (12.5 mg total) by mouth 2 (two) times daily. 60 tablet 3  . montelukast (SINGULAIR) 10 MG tablet Take 10 mg by mouth at bedtime.    Marland Kitchen oxybutynin (DITROPAN) 5 MG tablet Take 5 mg by mouth 3 (three) times daily as needed for bladder spasms.    . pantoprazole (PROTONIX) 40 MG tablet Take 40 mg by mouth daily. AM    . potassium chloride SA (K-DUR,KLOR-CON) 20 MEQ tablet Take 1 tablet (20 mEq total) by mouth daily. If taking Lasix 30 tablet 3  . pramipexole (MIRAPEX) 0.25 MG tablet Take 0.25-0.5 mg by mouth 3 (three) times daily. Pt takes two tablets in the morning, one tablet at noon, and two tablets at bedtime.    . predniSONE (DELTASONE) 10 MG tablet Take 10 mg by mouth daily.  0  . traMADol (ULTRAM) 50 MG tablet Take 1-2 tablets (50-100 mg total) by mouth every 4 (four) hours as needed for moderate pain. 30 tablet 0  . zolpidem (AMBIEN) 5 MG tablet Take 1 tablet (5 mg total) by mouth at bedtime. 90 tablet 1   No current facility-administered medications for this visit.    Medical Decision Making:  Established Problem, Stable/Improving (1) and Review of Last Therapy Session (1)  Treatment Plan Summary:Medication management  Depression Continue on Prozac 40 mg in the morning- 90 day supply given  I will start her on lamotrigine 25 mg daily for her more stabilization and discuss about the risk of rash and Katherina Right syndrome.  Anxiety Advised patient to start taking Klonopin 0.5 mg at bed. She is also taking zolpidem at bedtime.  Patient has supply of the zolpidem. I will give her a prescription of Klonopin  at this time.   Follow-up She will follow-up in 6 weeks  or earlier      More than 50% of the time spent in psychoeducation, counseling and coordination of care.  Time spent with the patient 25 minutes   This note was generated in part or whole with voice recognition software. Voice regonition is usually quite accurate but there are transcription errors that can and very often do occur. I apologize for any typographical errors that were not detected and corrected.    Rainey Pines, MD  07/09/2015, 10:24 AM

## 2015-07-19 DIAGNOSIS — B028 Zoster with other complications: Secondary | ICD-10-CM | POA: Diagnosis not present

## 2015-08-16 DIAGNOSIS — D510 Vitamin B12 deficiency anemia due to intrinsic factor deficiency: Secondary | ICD-10-CM | POA: Diagnosis not present

## 2015-08-17 ENCOUNTER — Ambulatory Visit (INDEPENDENT_AMBULATORY_CARE_PROVIDER_SITE_OTHER): Payer: PPO | Admitting: Psychiatry

## 2015-08-17 ENCOUNTER — Encounter: Payer: Self-pay | Admitting: Psychiatry

## 2015-08-17 DIAGNOSIS — F39 Unspecified mood [affective] disorder: Secondary | ICD-10-CM

## 2015-08-17 MED ORDER — LAMOTRIGINE 25 MG PO TABS: 50.0000 mg | ORAL_TABLET | Freq: Every day | ORAL | Status: DC

## 2015-08-17 MED ORDER — CLONAZEPAM 0.5 MG PO TABS: 0.5000 mg | ORAL_TABLET | Freq: Every day | ORAL | Status: DC

## 2015-08-17 NOTE — Progress Notes (Signed)
BH MD/PA/NP OP Progress Note  08/17/2015 11:04 AM Tonya Walton  MRN:  [id number]  Subjective:  Pt is a 65 year old married female who presented for the follow-up. She  reported that she has been doing well on her medications. She reported that she has started improving on the lamotrigine. Her mood is improving and she does not lose temper quickly. She reported that the lamotrigine did help her and she has noted that she has bipolar disorder. Patient reported that she has been sleeping well with the help of Klonopin and Ambien. She is willing to decrease the dose of Klonopin at this time. Patient reported that her temper is also improving. We discussed at length about her medications. She currently denied having any suicidal ideations or plans. She denied having any perceptual disturbances.   She reported that she likes cooking and supporting her friends and family. She enjoys talking during the interview as well.    Chief Complaint:  Chief Complaint    Follow-up; Medication Refill     Visit Diagnosis:     ICD-9-CM ICD-10-CM   1. Episodic mood disorder ([place]) 296.90 F39     Past Medical History:  Past Medical History  Diagnosis Date  . Anxiety   . Depression   . Dysrhythmia     Afib  . CHF (congestive heart failure) ([place])   . Hypertension   . COPD (chronic obstructive pulmonary disease) ([place])   . Hypothyroidism   . GERD (gastroesophageal reflux disease)   . Headache   . Neuromuscular disorder ([place])     nerve damage in legs  . Arthritis   . Cancer [facility])     right breast  . Atrial fibrillation, controlled ([place])   . Shortness of breath dyspnea   . Restless leg syndrome   . Coronary artery disease     Past Surgical History  Procedure Laterality Date  . Breast lumpectomy      Right breast  . Appendectomy    . Tonsillectomy    . Total knee arthroplasty Right 06/10/2014    Procedure: TOTAL KNEE ARTHROPLASTY;  Surgeon: Dereck Leep, MD;  Location: [facility];  Service:  Orthopedics;  Laterality: Right;  . Joint replacement Right     Total Knee Replacement  . Total knee arthroplasty Left 09/02/2014    Procedure: TOTAL KNEE ARTHROPLASTY;  Surgeon: Dereck Leep, MD;  Location: [facility];  Service: Orthopedics;  Laterality: Left;  . Cataract extraction w/phaco Right 10/14/2014    Procedure: CATARACT EXTRACTION PHACO AND INTRAOCULAR LENS PLACEMENT (IOC);  Surgeon: Leandrew Koyanagi, MD;  Location: [place];  Service: Ophthalmology;  Laterality: Right;  . Breast excisional biopsy Right 2012    +  . Breast biopsy Left 07/2013    -  . Cardiac catheterization N/A 03/02/2015    Procedure: Left Heart Cath and Coronary Angiography;  Surgeon: Teodoro Spray, MD;  Location: [place] [facility];  Service: Cardiovascular;  Laterality: N/A;  . Coronary artery bypass graft N/A 04/01/2015    Procedure: CORONARY ARTERY BYPASS GRAFTING (CABG) x one , using left mammary artery;  Surgeon: Ivin Poot, MD;  Location: [place];  Service: Open Heart Surgery;  Laterality: N/A;  . Tee without cardioversion N/A 04/01/2015    Procedure: TRANSESOPHAGEAL ECHOCARDIOGRAM (TEE);  Surgeon: Ivin Poot, MD;  Location: [place];  Service: Open Heart Surgery;  Laterality: N/A;   Family History:  Family History  Problem Relation Age of Onset  . Leukemia Mother   .  Bone cancer Father   . Heart disease Brother   . Heart disease Brother   . Diabetes Brother   . Hypertension Brother   . Diabetes Brother   . Hypertension Brother   . Heart disease Brother    Social History:  Social History   Social History  . Marital Status: Married    Spouse Name: N/A  . Number of Children: N/A  . Years of Education: N/A   Social History Main Topics  . Smoking status: Former Smoker -- 1.00 packs/day    Types: Cigarettes    Quit date: 04/30/1992  . Smokeless tobacco: Never Used  . Alcohol Use: No  . Drug Use: No  . Sexual Activity: No   Other Topics Concern  . None   Social History  Narrative   Additional History:  She currently lives with her husband who is very helpful and has been taking care of her during her surgery. She reported that he is a good  nurse  Assessment:   Musculoskeletal: Strength & Muscle Tone: within normal limits Gait & Station: normal Patient leans: N/A  Psychiatric Specialty Exam: Depression        Associated symptoms include does not have insomnia and no suicidal ideas.   Review of Systems  Constitutional: Negative for chills.  HENT: Negative for congestion and tinnitus.   Eyes: Negative for double vision.  Respiratory: Negative for hemoptysis.   Cardiovascular: Positive for chest pain. Negative for palpitations.  Gastrointestinal: Positive for diarrhea. Negative for nausea.  Genitourinary: Negative for urgency.  Musculoskeletal: Positive for joint pain. Negative for back pain.  Skin: Negative for rash.  Neurological: Negative for sensory change.  Endo/Heme/Allergies: Negative for environmental allergies.  Psychiatric/Behavioral: Positive for depression. Negative for suicidal ideas and substance abuse. The patient does not have insomnia.     Blood pressure 122/74, pulse 73, temperature 97.9 F (36.6 C), temperature source Tympanic, height 5' 6" (1.676 m), weight 218 lb 3.2 oz (98.975 kg), SpO2 92 %.Body mass index is 35.24 kg/(m^2).  General Appearance: Casual  Eye Contact:  Fair  Speech:  Normal Rate  Volume:  Normal  Mood:  Euthymic  Affect:  Appropriate  Thought Process:  Coherent  Orientation:  Full (Time, Place, and Person)  Thought Content:  WDL  Suicidal Thoughts:  No  Homicidal Thoughts:  No  Memory:  Immediate;   Fair  Judgement:  Fair  Insight:  Fair  Psychomotor Activity:  Normal  Concentration:  Fair  Recall:  [employer] of Knowledge: Fair  Language: Fair  Akathisia:  No  Handed:  Right  AIMS (if indicated):  none  Assets:  Communication Skills Desire for Improvement Social Support  ADL's:  Intact   Cognition: WNL  Sleep:  8-10   Is the patient at risk to self?  No. Has the patient been a risk to self in the past 6 months?  No. Has the patient been a risk to self within the distant past?  No. Is the patient a risk to others?  No. Has the patient been a risk to others in the past 6 months?  No. Has the patient been a risk to others within the distant past?  No.  Current Medications: Current Outpatient Prescriptions  Medication Sig Dispense Refill  . anastrozole (ARIMIDEX) 1 MG tablet Take 1 mg by mouth daily.     . Artificial Tear Ointment (DRY EYES OP) Place 1 drop into both eyes at bedtime.    Marland Kitchen aspirin EC  325 MG EC tablet Take 1 tablet (325 mg total) by mouth daily. 30 tablet 0  . atorvastatin (LIPITOR) 40 MG tablet Take 1 tablet (40 mg total) by mouth daily at 6 PM. 30 tablet 3  . clonazePAM (KLONOPIN) 0.5 MG tablet Take 1 tablet (0.5 mg total) by mouth at bedtime. 30 tablet 1  . Cyanocobalamin (B-12 COMPLIANCE INJECTION) 1000 MCG/ML KIT Inject 1,000 mcg as directed every Monday, Wednesday, and Friday.     . Cyanocobalamin ([employer]S B-12) 1000 MCG/15ML LIQD Take 1,000 mcg by mouth every Monday, Wednesday, and Friday. Rotates weekly with the injection    . cyclobenzaprine (FLEXERIL) 10 MG tablet Take 10 mg by mouth daily.     Marland Kitchen doxycycline (VIBRA-TABS) 100 MG tablet Take 100 mg by mouth 2 (two) times daily with a meal.  0  . esomeprazole (NEXIUM) 40 MG capsule Take by mouth.    . ferrous [id number] C-folic acid (TRINSICON / FOLTRIN) capsule Take 1 capsule by mouth 3 (three) times daily after meals. 90 capsule 3  . FLUoxetine (PROZAC) 40 MG capsule Take 1 capsule (40 mg total) by mouth daily. 90 capsule 1  . furosemide (LASIX) 40 MG tablet Take 40 mg by mouth daily as needed for fluid.     Marland Kitchen gabapentin (NEURONTIN) 300 MG capsule Take 1,500 mg by mouth 2 (two) times daily. 1500 mg AM, 1500 mg PM    . lamoTRIgine (LAMICTAL) 25 MG tablet Take 2 tablets (50 mg total) by mouth  daily. 60 tablet 1  . levothyroxine (SYNTHROID, LEVOTHROID) 25 MCG tablet Take 25 mcg by mouth daily.     Marland Kitchen lisinopril (PRINIVIL,ZESTRIL) 5 MG tablet Take 1 tablet (5 mg total) by mouth daily. 30 tablet 3  . meloxicam (MOBIC) 15 MG tablet     . metoprolol tartrate (LOPRESSOR) 25 MG tablet Take 0.5 tablets (12.5 mg total) by mouth 2 (two) times daily. 60 tablet 3  . montelukast (SINGULAIR) 10 MG tablet Take 10 mg by mouth at bedtime.    Marland Kitchen nystatin cream (MYCOSTATIN) Apply topically.    Marland Kitchen oxybutynin (DITROPAN) 5 MG tablet Take 5 mg by mouth 3 (three) times daily as needed for bladder spasms.    . pantoprazole (PROTONIX) 40 MG tablet Take 40 mg by mouth daily. AM    . potassium chloride SA (K-DUR,KLOR-CON) 20 MEQ tablet Take 1 tablet (20 mEq total) by mouth daily. If taking Lasix 30 tablet 3  . pramipexole (MIRAPEX) 0.25 MG tablet Take 0.25-0.5 mg by mouth 3 (three) times daily. Pt takes two tablets in the morning, one tablet at noon, and two tablets at bedtime.    . predniSONE (DELTASONE) 10 MG tablet Take 10 mg by mouth daily.  0  . zolpidem (AMBIEN) 5 MG tablet Take 1 tablet (5 mg total) by mouth at bedtime. 90 tablet 1   No current facility-administered medications for this visit.    Medical Decision Making:  Established Problem, Stable/Improving (1) and Review of Last Therapy Session (1)  Treatment Plan Summary:Medication management  Depression Continue on Prozac 40 mg in the morning- 90 day supply given  I will start her on lamotrigine 50  mg daily for her more stabilization and discuss about the risk of rash and Katherina Right syndrome.  Anxiety Advised patient to start taking Klonopin 0.25  mg at bed. She is also taking zolpidem at bedtime. Patient has supply of the zolpidem. I will give her a prescription of Klonopin at this time.   Follow-up She  will follow-up in 4 weeks  or earlier      More than 50% of the time spent in psychoeducation, counseling and coordination of care.   Time spent with the patient 25 minutes   This note was generated in part or whole with voice recognition software. Voice regonition is usually quite accurate but there are transcription errors that can and very often do occur. I apologize for any typographical errors that were not detected and corrected.    Rainey Pines, MD  08/17/2015, 11:04 AM

## 2015-08-23 DIAGNOSIS — J432 Centrilobular emphysema: Secondary | ICD-10-CM | POA: Diagnosis not present

## 2015-08-23 DIAGNOSIS — I25118 Atherosclerotic heart disease of native coronary artery with other forms of angina pectoris: Secondary | ICD-10-CM | POA: Diagnosis not present

## 2015-08-23 DIAGNOSIS — D510 Vitamin B12 deficiency anemia due to intrinsic factor deficiency: Secondary | ICD-10-CM | POA: Diagnosis not present

## 2015-08-25 DIAGNOSIS — E782 Mixed hyperlipidemia: Secondary | ICD-10-CM | POA: Diagnosis not present

## 2015-08-25 DIAGNOSIS — I10 Essential (primary) hypertension: Secondary | ICD-10-CM | POA: Diagnosis not present

## 2015-08-25 DIAGNOSIS — I25118 Atherosclerotic heart disease of native coronary artery with other forms of angina pectoris: Secondary | ICD-10-CM | POA: Diagnosis not present

## 2015-09-01 ENCOUNTER — Other Ambulatory Visit: Payer: Self-pay | Admitting: Psychiatry

## 2015-09-14 ENCOUNTER — Ambulatory Visit (INDEPENDENT_AMBULATORY_CARE_PROVIDER_SITE_OTHER): Payer: PPO | Admitting: Psychiatry

## 2015-09-14 ENCOUNTER — Encounter: Payer: Self-pay | Admitting: Psychiatry

## 2015-09-14 VITALS — BP 115/72 | HR 68 | Temp 97.8°F | Ht 66.0 in | Wt 220.2 lb

## 2015-09-14 DIAGNOSIS — F39 Unspecified mood [affective] disorder: Secondary | ICD-10-CM | POA: Diagnosis not present

## 2015-09-14 MED ORDER — FLUOXETINE HCL 40 MG PO CAPS: 40.0000 mg | ORAL_CAPSULE | Freq: Every day | ORAL | 1 refills | Status: DC

## 2015-09-14 MED ORDER — ZOLPIDEM TARTRATE 5 MG PO TABS: 5.0000 mg | ORAL_TABLET | Freq: Every day | ORAL | 1 refills | Status: DC

## 2015-09-14 MED ORDER — CLONAZEPAM 0.5 MG PO TABS: 0.2500 mg | ORAL_TABLET | Freq: Every day | ORAL | 2 refills | Status: DC

## 2015-09-14 MED ORDER — LAMOTRIGINE 25 MG PO TABS: 50.0000 mg | ORAL_TABLET | Freq: Every day | ORAL | 1 refills | Status: DC

## 2015-09-14 NOTE — Progress Notes (Signed)
BH MD/PA/NP OP Progress Note  09/14/2015 8:46 AM Tonya Walton  MRN:  [id number]  Subjective:  Pt is a 65 year old married female who presented for the follow-up. She  reported that she has been doing well on her medications. She reported that she has started improving on the lamotrigine noticed improvement since she has increase the dose to 50 mg. Her mood is improving and she does not lose temper quickly. She reported that the lamotrigine did help her. Patient reported that she has been sleeping well with the help of Klonopin and Ambien. She is willing to decrease the dose of Klonopin at this time. She is also trying to start drinking the green tea on a daily basis.. We discussed at length about her medications. She currently denied having any suicidal ideations or plans. She denied having any perceptual disturbances.   She reported that she likes cooking and supporting her friends and family. She enjoys talking during the interview as well.    Chief Complaint:  Chief Complaint    Follow-up; Medication Refill     Visit Diagnosis:     ICD-9-CM ICD-10-CM   1. Episodic mood disorder ([place]) 296.90 F39     Past Medical History:  Past Medical History:  Diagnosis Date  . Anxiety   . Arthritis   . Atrial fibrillation, controlled ([place])   . Cancer [facility])    right breast  . CHF (congestive heart failure) ([place])   . COPD (chronic obstructive pulmonary disease) ([place])   . Coronary artery disease   . Depression   . Dysrhythmia    Afib  . GERD (gastroesophageal reflux disease)   . Headache   . Hypertension   . Hypothyroidism   . Neuromuscular disorder ([place])    nerve damage in legs  . Restless leg syndrome   . Shortness of breath dyspnea     Past Surgical History:  Procedure Laterality Date  . APPENDECTOMY    . BREAST BIOPSY Left 07/2013   -  . BREAST EXCISIONAL BIOPSY Right 2012   +  . BREAST LUMPECTOMY     Right breast  . CARDIAC CATHETERIZATION N/A 03/02/2015   Procedure: Left  Heart Cath and Coronary Angiography;  Surgeon: Teodoro Spray, MD;  Location: [place] [facility];  Service: Cardiovascular;  Laterality: N/A;  . CATARACT EXTRACTION W/PHACO Right 10/14/2014   Procedure: CATARACT EXTRACTION PHACO AND INTRAOCULAR LENS PLACEMENT (IOC);  Surgeon: Leandrew Koyanagi, MD;  Location: [place];  Service: Ophthalmology;  Laterality: Right;  . CORONARY ARTERY BYPASS GRAFT N/A 04/01/2015   Procedure: CORONARY ARTERY BYPASS GRAFTING (CABG) x one , using left mammary artery;  Surgeon: Ivin Poot, MD;  Location: [place];  Service: Open Heart Surgery;  Laterality: N/A;  . JOINT REPLACEMENT Right    Total Knee Replacement  . TEE WITHOUT CARDIOVERSION N/A 04/01/2015   Procedure: TRANSESOPHAGEAL ECHOCARDIOGRAM (TEE);  Surgeon: Ivin Poot, MD;  Location: [place];  Service: Open Heart Surgery;  Laterality: N/A;  . TONSILLECTOMY    . TOTAL KNEE ARTHROPLASTY Right 06/10/2014   Procedure: TOTAL KNEE ARTHROPLASTY;  Surgeon: Dereck Leep, MD;  Location: [facility];  Service: Orthopedics;  Laterality: Right;  . TOTAL KNEE ARTHROPLASTY Left 09/02/2014   Procedure: TOTAL KNEE ARTHROPLASTY;  Surgeon: Dereck Leep, MD;  Location: [facility];  Service: Orthopedics;  Laterality: Left;   Family History:  Family History  Problem Relation Age of Onset  . Leukemia Mother   . Bone cancer Father   .  Heart disease Brother   . Heart disease Brother   . Diabetes Brother   . Hypertension Brother   . Diabetes Brother   . Hypertension Brother   . Heart disease Brother    Social History:  Social History   Social History  . Marital status: Married    Spouse name: N/A  . Number of children: N/A  . Years of education: N/A   Social History Main Topics  . Smoking status: Former Smoker    Packs/day: 1.00    Types: Cigarettes    Quit date: 04/30/1992  . Smokeless tobacco: Never Used  . Alcohol use No  . Drug use: No  . Sexual activity: No   Other Topics Concern  . None    Social History Narrative  . None   Additional History:  She currently lives with her husband who is very helpful and has been taking care of her during her surgery. She reported that he is a good  nurse  Assessment:   Musculoskeletal: Strength & Muscle Tone: within normal limits Gait & Station: normal Patient leans: N/A  Psychiatric Specialty Exam: Depression         Associated symptoms include does not have insomnia and no suicidal ideas. Medication Refill  Associated symptoms include chest pain. Pertinent negatives include no chills, congestion, nausea or rash.    Review of Systems  Constitutional: Negative for chills.  HENT: Negative for congestion and tinnitus.   Eyes: Negative for double vision.  Respiratory: Negative for hemoptysis.   Cardiovascular: Positive for chest pain. Negative for palpitations.  Gastrointestinal: Positive for diarrhea. Negative for nausea.  Genitourinary: Negative for urgency.  Musculoskeletal: Positive for joint pain. Negative for back pain.  Skin: Negative for rash.  Neurological: Negative for sensory change.  Endo/Heme/Allergies: Negative for environmental allergies.  Psychiatric/Behavioral: Positive for depression. Negative for substance abuse and suicidal ideas. The patient does not have insomnia.     Blood pressure 115/72, pulse 68, temperature 97.8 F (36.6 C), temperature source Oral, height 5' 6"  (1.676 m), weight 220 lb 3.2 oz (99.9 kg).Body mass index is 35.54 kg/m.  General Appearance: Casual  Eye Contact:  Fair  Speech:  Normal Rate  Volume:  Normal  Mood:  Euthymic  Affect:  Appropriate  Thought Process:  Coherent  Orientation:  Full (Time, Place, and Person)  Thought Content:  WDL  Suicidal Thoughts:  No  Homicidal Thoughts:  No  Memory:  Immediate;   Fair  Judgement:  Fair  Insight:  Fair  Psychomotor Activity:  Normal  Concentration:  Fair  Recall:  [employer] of Knowledge: Fair  Language: Fair  Akathisia:  No   Handed:  Right  AIMS (if indicated):  none  Assets:  Communication Skills Desire for Improvement Social Support  ADL's:  Intact  Cognition: WNL  Sleep:  8-10   Is the patient at risk to self?  No. Has the patient been a risk to self in the past 6 months?  No. Has the patient been a risk to self within the distant past?  No. Is the patient a risk to others?  No. Has the patient been a risk to others in the past 6 months?  No. Has the patient been a risk to others within the distant past?  No.  Current Medications: Current Outpatient Prescriptions  Medication Sig Dispense Refill  . anastrozole (ARIMIDEX) 1 MG tablet Take 1 mg by mouth daily.     . Artificial Tear Ointment (DRY EYES  OP) Place 1 drop into both eyes at bedtime.    Marland Kitchen aspirin EC 325 MG EC tablet Take 1 tablet (325 mg total) by mouth daily. 30 tablet 0  . atorvastatin (LIPITOR) 40 MG tablet Take 1 tablet (40 mg total) by mouth daily at 6 PM. 30 tablet 3  . clonazePAM (KLONOPIN) 0.5 MG tablet Take 1 tablet (0.5 mg total) by mouth at bedtime. 30 tablet 1  . Cyanocobalamin (B-12 COMPLIANCE INJECTION) 1000 MCG/ML KIT Inject 1,000 mcg as directed every Monday, Wednesday, and Friday.     . Cyanocobalamin ([employer] B-12) 1000 MCG/15ML LIQD Take 1,000 mcg by mouth every Monday, Wednesday, and Friday. Rotates weekly with the injection    . cyclobenzaprine (FLEXERIL) 10 MG tablet Take 10 mg by mouth daily.     Marland Kitchen doxycycline (VIBRA-TABS) 100 MG tablet Take 100 mg by mouth 2 (two) times daily with a meal.  0  . esomeprazole (NEXIUM) 40 MG capsule Take by mouth.    . ferrous [id number] C-folic acid (TRINSICON / FOLTRIN) capsule Take 1 capsule by mouth 3 (three) times daily after meals. 90 capsule 3  . FLUoxetine (PROZAC) 40 MG capsule Take 1 capsule (40 mg total) by mouth daily. 90 capsule 1  . furosemide (LASIX) 40 MG tablet Take 40 mg by mouth daily as needed for fluid.     Marland Kitchen gabapentin (NEURONTIN) 300 MG capsule Take 1,500 mg by  mouth 2 (two) times daily. 1500 mg AM, 1500 mg PM    . lamoTRIgine (LAMICTAL) 25 MG tablet Take 2 tablets (50 mg total) by mouth daily. 60 tablet 1  . lamoTRIgine (LAMICTAL) 25 MG tablet TAKE 1 TABLET (25 MG TOTAL) BY MOUTH DAILY. 30 tablet 1  . levothyroxine (SYNTHROID, LEVOTHROID) 25 MCG tablet Take 25 mcg by mouth daily.     Marland Kitchen lisinopril (PRINIVIL,ZESTRIL) 5 MG tablet Take 1 tablet (5 mg total) by mouth daily. 30 tablet 3  . meloxicam (MOBIC) 15 MG tablet     . metoprolol tartrate (LOPRESSOR) 25 MG tablet Take 0.5 tablets (12.5 mg total) by mouth 2 (two) times daily. 60 tablet 3  . montelukast (SINGULAIR) 10 MG tablet Take 10 mg by mouth at bedtime.    Marland Kitchen nystatin cream (MYCOSTATIN) Apply topically.    Marland Kitchen oxybutynin (DITROPAN) 5 MG tablet Take 5 mg by mouth 3 (three) times daily as needed for bladder spasms.    . pantoprazole (PROTONIX) 40 MG tablet Take 40 mg by mouth daily. AM    . potassium chloride SA (K-DUR,KLOR-CON) 20 MEQ tablet Take 1 tablet (20 mEq total) by mouth daily. If taking Lasix 30 tablet 3  . pramipexole (MIRAPEX) 0.25 MG tablet Take 0.25-0.5 mg by mouth 3 (three) times daily. Pt takes two tablets in the morning, one tablet at noon, and two tablets at bedtime.    . predniSONE (DELTASONE) 10 MG tablet Take 10 mg by mouth daily.  0  . zolpidem (AMBIEN) 5 MG tablet Take 1 tablet (5 mg total) by mouth at bedtime. 90 tablet 1   No current facility-administered medications for this visit.     Medical Decision Making:  Established Problem, Stable/Improving (1) and Review of Last Therapy Session (1)  Treatment Plan Summary:Medication management  Depression Continue on Prozac 40 mg in the morning- 90 day supply given  Continue  lamotrigine 50  mg daily for her mood stabilization and discuss about the risk of rash and [place] Right syndrome.  Anxiety Advised patient to start taking Klonopin 0.25  mg at bed. She is also taking zolpidem at bedtime.   Follow-up She will  follow-up in 2 months       More than 50% of the time spent in psychoeducation, counseling and coordination of care.  Time spent with the patient 25 minutes   This note was generated in part or whole with voice recognition software. Voice regonition is usually quite accurate but there are transcription errors that can and very often do occur. I apologize for any typographical errors that were not detected and corrected.    Rainey Pines, MD  09/14/2015, 8:46 AM

## 2015-10-07 DIAGNOSIS — Z96651 Presence of right artificial knee joint: Secondary | ICD-10-CM | POA: Diagnosis not present

## 2015-10-07 DIAGNOSIS — Z96652 Presence of left artificial knee joint: Secondary | ICD-10-CM | POA: Diagnosis not present

## 2015-10-11 ENCOUNTER — Ambulatory Visit: Payer: PPO

## 2015-10-11 ENCOUNTER — Other Ambulatory Visit: Payer: PPO

## 2015-10-15 ENCOUNTER — Other Ambulatory Visit: Payer: Self-pay | Admitting: *Deleted

## 2015-10-15 DIAGNOSIS — Z853 Personal history of malignant neoplasm of breast: Secondary | ICD-10-CM

## 2015-10-17 NOTE — Progress Notes (Deleted)
[place]  [phone] [phone]  ID: Tonya Walton OB: 11-Sep-1950  MR#: [id number]  RAQ#:[id number]  Patient Care Team: Rusty Aus, MD as PCP - General (Internal Medicine)  CHIEF COMPLAINT: Stage Ia ER/PR adenocarcinoma of the right breast, unclear location.  INTERVAL HISTORY: ***  REVIEW OF SYSTEMS:   ROS  As per HPI. Otherwise, a complete review of systems is negatve.  PAST MEDICAL HISTORY: Past Medical History:  Diagnosis Date  . Anxiety   . Arthritis   . Atrial fibrillation, controlled ([place])   . Cancer [facility])    right breast  . CHF (congestive heart failure) ([place])   . COPD (chronic obstructive pulmonary disease) ([place])   . Coronary artery disease   . Depression   . Dysrhythmia    Afib  . GERD (gastroesophageal reflux disease)   . Headache   . Hypertension   . Hypothyroidism   . Neuromuscular disorder ([place])    nerve damage in legs  . Restless leg syndrome   . Shortness of breath dyspnea     PAST SURGICAL HISTORY: Past Surgical History:  Procedure Laterality Date  . APPENDECTOMY    . BREAST BIOPSY Left 07/2013   -  . BREAST EXCISIONAL BIOPSY Right 2012   +  . BREAST LUMPECTOMY     Right breast  . CARDIAC CATHETERIZATION N/A 03/02/2015   Procedure: Left Heart Cath and Coronary Angiography;  Surgeon: Teodoro Spray, MD;  Location: [place] [facility];  Service: Cardiovascular;  Laterality: N/A;  . CATARACT EXTRACTION W/PHACO Right 10/14/2014   Procedure: CATARACT EXTRACTION PHACO AND INTRAOCULAR LENS PLACEMENT (IOC);  Surgeon: Leandrew Koyanagi, MD;  Location: [place];  Service: Ophthalmology;  Laterality: Right;  . CORONARY ARTERY BYPASS GRAFT N/A 04/01/2015   Procedure: CORONARY ARTERY BYPASS GRAFTING (CABG) x one , using left mammary artery;  Surgeon: Ivin Poot, MD;  Location: [place];  Service: Open Heart Surgery;  Laterality: N/A;  . JOINT REPLACEMENT Right    Total Knee Replacement  . TEE  WITHOUT CARDIOVERSION N/A 04/01/2015   Procedure: TRANSESOPHAGEAL ECHOCARDIOGRAM (TEE);  Surgeon: Ivin Poot, MD;  Location: [place];  Service: Open Heart Surgery;  Laterality: N/A;  . TONSILLECTOMY    . TOTAL KNEE ARTHROPLASTY Right 06/10/2014   Procedure: TOTAL KNEE ARTHROPLASTY;  Surgeon: Dereck Leep, MD;  Location: [facility];  Service: Orthopedics;  Laterality: Right;  . TOTAL KNEE ARTHROPLASTY Left 09/02/2014   Procedure: TOTAL KNEE ARTHROPLASTY;  Surgeon: Dereck Leep, MD;  Location: [facility];  Service: Orthopedics;  Laterality: Left;    FAMILY HISTORY: Family History  Problem Relation Age of Onset  . Leukemia Mother   . Bone cancer Father   . Heart disease Brother   . Heart disease Brother   . Diabetes Brother   . Hypertension Brother   . Diabetes Brother   . Hypertension Brother   . Heart disease Brother     ADVANCED DIRECTIVES (Y/N):  N  HEALTH MAINTENANCE: Social History  Substance Use Topics  . Smoking status: Former Smoker    Packs/day: 1.00    Types: Cigarettes    Quit date: 04/30/1992  . Smokeless tobacco: Never Used  . Alcohol use No     Colonoscopy:  PAP:  Bone density:  Lipid panel:  No Known Allergies  Current Outpatient Prescriptions  Medication Sig Dispense Refill  . anastrozole (ARIMIDEX) 1 MG tablet Take 1 mg by mouth daily.     Marland Kitchen  Artificial Tear Ointment (DRY EYES OP) Place 1 drop into both eyes at bedtime.    Marland Kitchen aspirin EC 325 MG EC tablet Take 1 tablet (325 mg total) by mouth daily. 30 tablet 0  . atorvastatin (LIPITOR) 40 MG tablet Take 1 tablet (40 mg total) by mouth daily at 6 PM. 30 tablet 3  . clonazePAM (KLONOPIN) 0.5 MG tablet Take 0.5 tablets (0.25 mg total) by mouth at bedtime. 15 tablet 2  . Cyanocobalamin (B-12 COMPLIANCE INJECTION) 1000 MCG/ML KIT Inject 1,000 mcg as directed every Monday, Wednesday, and Friday.     . Cyanocobalamin ([employer] B-12) 1000 MCG/15ML LIQD Take 1,000 mcg by mouth every Monday, Wednesday, and Friday. Rotates  weekly with the injection    . cyclobenzaprine (FLEXERIL) 10 MG tablet Take 10 mg by mouth daily.     Marland Kitchen doxycycline (VIBRA-TABS) 100 MG tablet Take 100 mg by mouth 2 (two) times daily with a meal.  0  . esomeprazole (NEXIUM) 40 MG capsule Take by mouth.    . ferrous [id number] C-folic acid (TRINSICON / FOLTRIN) capsule Take 1 capsule by mouth 3 (three) times daily after meals. 90 capsule 3  . FLUoxetine (PROZAC) 40 MG capsule Take 1 capsule (40 mg total) by mouth daily. 90 capsule 1  . furosemide (LASIX) 40 MG tablet Take 40 mg by mouth daily as needed for fluid.     Marland Kitchen gabapentin (NEURONTIN) 300 MG capsule Take 1,500 mg by mouth 2 (two) times daily. 1500 mg AM, 1500 mg PM    . lamoTRIgine (LAMICTAL) 25 MG tablet Take 2 tablets (50 mg total) by mouth daily. 180 tablet 1  . levothyroxine (SYNTHROID, LEVOTHROID) 25 MCG tablet Take 25 mcg by mouth daily.     Marland Kitchen lisinopril (PRINIVIL,ZESTRIL) 5 MG tablet Take 1 tablet (5 mg total) by mouth daily. 30 tablet 3  . meloxicam (MOBIC) 15 MG tablet     . metoprolol tartrate (LOPRESSOR) 25 MG tablet Take 0.5 tablets (12.5 mg total) by mouth 2 (two) times daily. 60 tablet 3  . montelukast (SINGULAIR) 10 MG tablet Take 10 mg by mouth at bedtime.    Marland Kitchen nystatin cream (MYCOSTATIN) Apply topically.    Marland Kitchen oxybutynin (DITROPAN) 5 MG tablet Take 5 mg by mouth 3 (three) times daily as needed for bladder spasms.    . pantoprazole (PROTONIX) 40 MG tablet Take 40 mg by mouth daily. AM    . potassium chloride SA (K-DUR,KLOR-CON) 20 MEQ tablet Take 1 tablet (20 mEq total) by mouth daily. If taking Lasix 30 tablet 3  . pramipexole (MIRAPEX) 0.25 MG tablet Take 0.25-0.5 mg by mouth 3 (three) times daily. Pt takes two tablets in the morning, one tablet at noon, and two tablets at bedtime.    . predniSONE (DELTASONE) 10 MG tablet Take 10 mg by mouth daily.  0  . zolpidem (AMBIEN) 5 MG tablet Take 1 tablet (5 mg total) by mouth at bedtime. 90 tablet 1   No current  facility-administered medications for this visit.     OBJECTIVE: There were no vitals filed for this visit.   There is no height or weight on file to calculate BMI.    ECOG FS:{CHL ONC [id number]  General: Well-developed, well-nourished, no acute distress. Eyes: Pink conjunctiva, anicteric sclera. HEENT: Normocephalic, moist mucous membranes, clear oropharnyx. Lungs: Clear to auscultation bilaterally. Heart: Regular rate and rhythm. No rubs, murmurs, or gallops. Abdomen: Soft, nontender, nondistended. No organomegaly noted, normoactive bowel sounds. Musculoskeletal: No edema, cyanosis, or clubbing.  Neuro: Alert, answering all questions appropriately. Cranial nerves grossly intact. Skin: No rashes or petechiae noted. Psych: Normal affect. Lymphatics: No cervical, calvicular, axillary or inguinal LAD.   LAB RESULTS:  Lab Results  Component Value Date   NA 142 04/06/2015   K 4.0 04/06/2015   CL 103 04/06/2015   CO2 27 04/06/2015   GLUCOSE 107 (H) 04/06/2015   BUN 11 04/06/2015   CREATININE 0.70 04/06/2015   CALCIUM 8.8 (L) 04/06/2015   PROT 7.6 03/30/2015   ALBUMIN 3.7 03/30/2015   AST 29 03/30/2015   ALT 21 03/30/2015   ALKPHOS 94 03/30/2015   BILITOT 0.3 03/30/2015   GFRNONAA >60 04/06/2015   GFRAA >60 04/06/2015    Lab Results  Component Value Date   WBC 7.4 04/06/2015   NEUTROABS 5.1 08/11/2013   HGB 7.5 (L) 04/06/2015   HCT 25.0 (L) 04/06/2015   MCV 87.7 04/06/2015   PLT 281 04/06/2015     STUDIES: No results found.  ASSESSMENT: Stage Ia ER/PR adenocarcinoma of the right breast, unclear location.  PLAN:    1. Stage Ia ER/PR adenocarcinoma of the right breast, unclear location:  Patient expressed understanding and was in agreement with this plan. She also understands that She can call clinic at any time with any questions, concerns, or complaints.   No matching staging information was found for the patient.  Lloyd Huger, MD   10/17/2015 9:50  PM

## 2015-10-18 ENCOUNTER — Inpatient Hospital Stay: Payer: PPO

## 2015-10-18 ENCOUNTER — Inpatient Hospital Stay: Payer: PPO | Admitting: Oncology

## 2015-10-22 ENCOUNTER — Other Ambulatory Visit: Payer: Self-pay | Admitting: Physician Assistant

## 2015-10-29 DIAGNOSIS — Z23 Encounter for immunization: Secondary | ICD-10-CM | POA: Diagnosis not present

## 2015-11-07 NOTE — Progress Notes (Signed)
[facility]  [phone] [phone]) [phone]  ID: Tonya Walton OB: 1950-06-24  MR#: [id number]  SWF#:[id number]  Patient Care Team: Rusty Aus, MD as PCP - General (Internal Medicine)  CHIEF COMPLAINT: Stage Ia ER/PR positive adenocarcinoma of the right breast, unspecified location.  INTERVAL HISTORY: Patient returns to clinic today for routine yearly follow-up. She continues to tolerate anastrozole well without significant side effects. She recently underwent CABG 1 in March and is now fully recovered from her surgery. She has no neurologic complaints. She denies any recent fevers or illnesses. She has a good appetite and denies weight loss. She has no chest pain or shortness of breath. She denies any nausea, vomiting, constipation, or diarrhea. She has no urinary complaints. Patient feels at her baseline and offers no specific complaints today.  REVIEW OF SYSTEMS:   Review of Systems  Constitutional: Negative.  Negative for fever, malaise/fatigue and weight loss.  Respiratory: Negative.  Negative for cough and shortness of breath.   Cardiovascular: Negative.  Negative for chest pain and leg swelling.  Gastrointestinal: Negative.  Negative for abdominal pain.  Genitourinary: Negative.   Musculoskeletal: Negative.   Neurological: Negative.  Negative for weakness.  Psychiatric/Behavioral: Negative.  The patient is not nervous/anxious.     As per HPI. Otherwise, a complete review of systems is negative.  PAST MEDICAL HISTORY: Past Medical History:  Diagnosis Date  . Anxiety   . Arthritis   . Atrial fibrillation, controlled ([place])   . Cancer [facility])    right breast  . CHF (congestive heart failure) ([place])   . COPD (chronic obstructive pulmonary disease) ([place])   . Coronary artery disease   . Depression   . Dysrhythmia    Afib  . GERD (gastroesophageal reflux disease)   . Headache   . Hypertension   . Hypothyroidism   . Neuromuscular disorder  ([place])    nerve damage in legs  . Restless leg syndrome   . Shortness of breath dyspnea     PAST SURGICAL HISTORY: Past Surgical History:  Procedure Laterality Date  . APPENDECTOMY    . BREAST BIOPSY Left 07/2013   -  . BREAST EXCISIONAL BIOPSY Right 2012   +  . BREAST LUMPECTOMY     Right breast  . CARDIAC CATHETERIZATION N/A 03/02/2015   Procedure: Left Heart Cath and Coronary Angiography;  Surgeon: Teodoro Spray, MD;  Location: [facility];  Service: Cardiovascular;  Laterality: N/A;  . CATARACT EXTRACTION W/PHACO Right 10/14/2014   Procedure: CATARACT EXTRACTION PHACO AND INTRAOCULAR LENS PLACEMENT (IOC);  Surgeon: Leandrew Koyanagi, MD;  Location: [place];  Service: Ophthalmology;  Laterality: Right;  . CORONARY ARTERY BYPASS GRAFT N/A 04/01/2015   Procedure: CORONARY ARTERY BYPASS GRAFTING (CABG) x one , using left mammary artery;  Surgeon: Ivin Poot, MD;  Location: [place];  Service: Open Heart Surgery;  Laterality: N/A;  . JOINT REPLACEMENT Right    Total Knee Replacement  . TEE WITHOUT CARDIOVERSION N/A 04/01/2015   Procedure: TRANSESOPHAGEAL ECHOCARDIOGRAM (TEE);  Surgeon: Ivin Poot, MD;  Location: [place];  Service: Open Heart Surgery;  Laterality: N/A;  . TONSILLECTOMY    . TOTAL KNEE ARTHROPLASTY Right 06/10/2014   Procedure: TOTAL KNEE ARTHROPLASTY;  Surgeon: Dereck Leep, MD;  Location: [facility];  Service: Orthopedics;  Laterality: Right;  . TOTAL KNEE ARTHROPLASTY Left 09/02/2014   Procedure: TOTAL KNEE ARTHROPLASTY;  Surgeon: Dereck Leep, MD;  Location: [facility];  Service: Orthopedics;  Laterality: Left;    FAMILY HISTORY: Family History  Problem Relation Age of Onset  . Leukemia Mother   . Bone cancer Father   . Heart disease Brother   . Heart disease Brother   . Diabetes Brother   . Hypertension Brother   . Diabetes Brother   . Hypertension Brother   . Heart disease Brother     ADVANCED DIRECTIVES (Y/N):  N  HEALTH  MAINTENANCE: Social History  Substance Use Topics  . Smoking status: Former Smoker    Packs/day: 1.00    Types: Cigarettes    Quit date: 04/30/1992  . Smokeless tobacco: Never Used  . Alcohol use No     Colonoscopy:  PAP:  Bone density:  Lipid panel:  No Known Allergies  Current Outpatient Prescriptions  Medication Sig Dispense Refill  . anastrozole (ARIMIDEX) 1 MG tablet Take 1 mg by mouth daily.     . Artificial Tear Ointment (DRY EYES OP) Place 1 drop into both eyes at bedtime.    Marland Kitchen aspirin EC 325 MG EC tablet Take 1 tablet (325 mg total) by mouth daily. 30 tablet 0  . atorvastatin (LIPITOR) 40 MG tablet Take 1 tablet (40 mg total) by mouth daily at 6 PM. 30 tablet 3  . clonazePAM (KLONOPIN) 0.5 MG tablet Take 0.5 tablets (0.25 mg total) by mouth at bedtime. 15 tablet 2  . Cyanocobalamin (B-12 COMPLIANCE INJECTION) 1000 MCG/ML KIT Inject 1,000 mcg as directed every Monday, Wednesday, and Friday.     . Cyanocobalamin ([employer] B-12) 1000 MCG/15ML LIQD Take 1,000 mcg by mouth every Monday, Wednesday, and Friday. Rotates weekly with the injection    . cyclobenzaprine (FLEXERIL) 10 MG tablet Take 10 mg by mouth daily.     Marland Kitchen esomeprazole (NEXIUM) 40 MG capsule Take by mouth.    . ferrous [id number] C-folic acid (TRINSICON / FOLTRIN) capsule Take 1 capsule by mouth 3 (three) times daily after meals. 90 capsule 3  . FLUoxetine (PROZAC) 40 MG capsule Take 1 capsule (40 mg total) by mouth daily. 90 capsule 1  . furosemide (LASIX) 40 MG tablet Take 40 mg by mouth daily as needed for fluid.     Marland Kitchen gabapentin (NEURONTIN) 300 MG capsule Take 1,500 mg by mouth 2 (two) times daily. 1500 mg AM, 1500 mg PM    . lamoTRIgine (LAMICTAL) 25 MG tablet Take 2 tablets (50 mg total) by mouth daily. 180 tablet 1  . levothyroxine (SYNTHROID, LEVOTHROID) 25 MCG tablet Take 25 mcg by mouth daily.     Marland Kitchen lisinopril (PRINIVIL,ZESTRIL) 5 MG tablet Take 1 tablet (5 mg total) by mouth daily. 30 tablet 3  .  meloxicam (MOBIC) 15 MG tablet     . metoprolol tartrate (LOPRESSOR) 25 MG tablet Take 0.5 tablets (12.5 mg total) by mouth 2 (two) times daily. 60 tablet 3  . montelukast (SINGULAIR) 10 MG tablet Take 10 mg by mouth at bedtime.    Marland Kitchen nystatin cream (MYCOSTATIN) Apply topically.    Marland Kitchen oxybutynin (DITROPAN) 5 MG tablet Take 5 mg by mouth 3 (three) times daily as needed for bladder spasms.    . pantoprazole (PROTONIX) 40 MG tablet Take 40 mg by mouth daily. AM    . potassium chloride SA (K-DUR,KLOR-CON) 20 MEQ tablet Take 1 tablet (20 mEq total) by mouth daily. If taking Lasix 30 tablet 3  . pramipexole (MIRAPEX) 0.25 MG tablet Take 0.25-0.5 mg by mouth 3 (three) times daily. Pt takes two tablets in the morning, one  tablet at noon, and two tablets at bedtime.    Marland Kitchen zolpidem (AMBIEN) 5 MG tablet Take 1 tablet (5 mg total) by mouth at bedtime. 90 tablet 1   No current facility-administered medications for this visit.     OBJECTIVE: Vitals:   11/08/15 1456  BP: (!) 144/80  Pulse: (!) 59  Resp: 18  Temp: (!) 95.5 F (35.3 C)     Body mass index is 35.35 kg/m.    ECOG FS:0 - Asymptomatic  General: Well-developed, well-nourished, no acute distress. Eyes: Pink conjunctiva, anicteric sclera. Breasts: Bilateral breasts and axilla without lumps or masses. Chest wall: Well-healed surgical scar. Lungs: Clear to auscultation bilaterally. Heart: Regular rate and rhythm. No rubs, murmurs, or gallops. Abdomen: Soft, nontender, nondistended. No organomegaly noted, normoactive bowel sounds. Musculoskeletal: No edema, cyanosis, or clubbing. Neuro: Alert, answering all questions appropriately. Cranial nerves grossly intact. Skin: No rashes or petechiae noted. Psych: Normal affect.   LAB RESULTS:  Lab Results  Component Value Date   NA 139 11/08/2015   K 3.9 11/08/2015   CL 106 11/08/2015   CO2 25 11/08/2015   GLUCOSE 97 11/08/2015   BUN 15 11/08/2015   CREATININE 0.66 11/08/2015   CALCIUM 9.3  11/08/2015   PROT 7.9 11/08/2015   ALBUMIN 4.1 11/08/2015   AST 22 11/08/2015   ALT 14 11/08/2015   ALKPHOS 79 11/08/2015   BILITOT 0.5 11/08/2015   GFRNONAA >60 11/08/2015   GFRAA >60 11/08/2015    Lab Results  Component Value Date   WBC 8.1 11/08/2015   NEUTROABS 5.1 11/08/2015   HGB 12.1 11/08/2015   HCT 36.0 11/08/2015   MCV 86.7 11/08/2015   PLT 268 11/08/2015     STUDIES: No results found.  ASSESSMENT: Stage Ia ER/PR positive adenocarcinoma of the right breast, unspecified location.  PLAN:    1. Stage Ia ER/PR positive adenocarcinoma of the right breast, unspecified location:  Patient completed XRT in January of 2013 and then initiated anastrozole in February 2013. She will take 5 years of treatment completing in February 2018. Her most recent mammogram on October 21, 2014 was reported as BI-RADS 3. Patient will require a mammogram in the next 1-2 weeks. Patient continues to request yearly follow-up, therefore return to clinic in October 2018. Patient could possibly be discharged from clinic at that time. 2. Osteopenia: Patient's most recent bone mineral density on October 21, 2014 was reported as -1.4. Repeat in the next 1-2 weeks.  Patient expressed understanding and was in agreement with this plan. She also understands that She can call clinic at any time with any questions, concerns, or complaints.   Primary cancer of right female breast [facility])   Staging form: Breast, AJCC 7th Edition   - Clinical stage from 10/17/2015: Stage IA (T1c, N0, M0) - Signed by Lloyd Huger, MD on 10/17/2015  Lloyd Huger, MD   11/13/2015 4:23 PM

## 2015-11-08 ENCOUNTER — Inpatient Hospital Stay: Payer: PPO | Attending: Oncology

## 2015-11-08 ENCOUNTER — Encounter (INDEPENDENT_AMBULATORY_CARE_PROVIDER_SITE_OTHER): Payer: Self-pay

## 2015-11-08 ENCOUNTER — Inpatient Hospital Stay (HOSPITAL_BASED_OUTPATIENT_CLINIC_OR_DEPARTMENT_OTHER): Payer: PPO | Admitting: Oncology

## 2015-11-08 VITALS — BP 144/80 | HR 59 | Temp 95.5°F | Resp 18 | Wt 219.0 lb

## 2015-11-08 DIAGNOSIS — R0602 Shortness of breath: Secondary | ICD-10-CM | POA: Insufficient documentation

## 2015-11-08 DIAGNOSIS — Z170 Estrogen receptor positive status [ER+]: Secondary | ICD-10-CM

## 2015-11-08 DIAGNOSIS — M129 Arthropathy, unspecified: Secondary | ICD-10-CM

## 2015-11-08 DIAGNOSIS — K219 Gastro-esophageal reflux disease without esophagitis: Secondary | ICD-10-CM

## 2015-11-08 DIAGNOSIS — I4891 Unspecified atrial fibrillation: Secondary | ICD-10-CM | POA: Diagnosis not present

## 2015-11-08 DIAGNOSIS — I2510 Atherosclerotic heart disease of native coronary artery without angina pectoris: Secondary | ICD-10-CM

## 2015-11-08 DIAGNOSIS — G709 Myoneural disorder, unspecified: Secondary | ICD-10-CM

## 2015-11-08 DIAGNOSIS — M8580 Other specified disorders of bone density and structure, unspecified site: Secondary | ICD-10-CM | POA: Insufficient documentation

## 2015-11-08 DIAGNOSIS — Z7982 Long term (current) use of aspirin: Secondary | ICD-10-CM | POA: Diagnosis not present

## 2015-11-08 DIAGNOSIS — J449 Chronic obstructive pulmonary disease, unspecified: Secondary | ICD-10-CM

## 2015-11-08 DIAGNOSIS — I509 Heart failure, unspecified: Secondary | ICD-10-CM

## 2015-11-08 DIAGNOSIS — F419 Anxiety disorder, unspecified: Secondary | ICD-10-CM

## 2015-11-08 DIAGNOSIS — Z808 Family history of malignant neoplasm of other organs or systems: Secondary | ICD-10-CM

## 2015-11-08 DIAGNOSIS — Z79899 Other long term (current) drug therapy: Secondary | ICD-10-CM | POA: Insufficient documentation

## 2015-11-08 DIAGNOSIS — F329 Major depressive disorder, single episode, unspecified: Secondary | ICD-10-CM

## 2015-11-08 DIAGNOSIS — E039 Hypothyroidism, unspecified: Secondary | ICD-10-CM | POA: Insufficient documentation

## 2015-11-08 DIAGNOSIS — Z87891 Personal history of nicotine dependence: Secondary | ICD-10-CM | POA: Diagnosis not present

## 2015-11-08 DIAGNOSIS — C50911 Malignant neoplasm of unspecified site of right female breast: Secondary | ICD-10-CM

## 2015-11-08 DIAGNOSIS — I10 Essential (primary) hypertension: Secondary | ICD-10-CM | POA: Insufficient documentation

## 2015-11-08 DIAGNOSIS — Z79811 Long term (current) use of aromatase inhibitors: Secondary | ICD-10-CM | POA: Diagnosis not present

## 2015-11-08 DIAGNOSIS — Z853 Personal history of malignant neoplasm of breast: Secondary | ICD-10-CM

## 2015-11-08 LAB — CBC WITH DIFFERENTIAL/PLATELET
Basophils Absolute: 0.1 10*3/uL (ref 0–0.1)
Basophils Relative: 1 %
Eosinophils Absolute: 0.1 10*3/uL (ref 0–0.7)
Eosinophils Relative: 2 %
HEMATOCRIT: 36 % (ref 35.0–47.0)
HEMOGLOBIN: 12.1 g/dL (ref 12.0–16.0)
LYMPHS ABS: 2.3 10*3/uL (ref 1.0–3.6)
Lymphocytes Relative: 28 %
MCH: 29.1 pg (ref 26.0–34.0)
MCHC: 33.6 g/dL (ref 32.0–36.0)
MCV: 86.7 fL (ref 80.0–100.0)
MONOS PCT: 7 %
Monocytes Absolute: 0.5 10*3/uL (ref 0.2–0.9)
NEUTROS ABS: 5.1 10*3/uL (ref 1.4–6.5)
NEUTROS PCT: 62 %
Platelets: 268 10*3/uL (ref 150–440)
RBC: 4.16 MIL/uL (ref 3.80–5.20)
RDW: 14.1 % (ref 11.5–14.5)
WBC: 8.1 10*3/uL (ref 3.6–11.0)

## 2015-11-08 LAB — COMPREHENSIVE METABOLIC PANEL
ALK PHOS: 79 U/L (ref 38–126)
ALT: 14 U/L (ref 14–54)
ANION GAP: 8 (ref 5–15)
AST: 22 U/L (ref 15–41)
Albumin: 4.1 g/dL (ref 3.5–5.0)
BILIRUBIN TOTAL: 0.5 mg/dL (ref 0.3–1.2)
BUN: 15 mg/dL (ref 6–20)
CALCIUM: 9.3 mg/dL (ref 8.9–10.3)
CO2: 25 mmol/L (ref 22–32)
Chloride: 106 mmol/L (ref 101–111)
Creatinine, Ser: 0.66 mg/dL (ref 0.44–1.00)
GLUCOSE: 97 mg/dL (ref 65–99)
Potassium: 3.9 mmol/L (ref 3.5–5.1)
Sodium: 139 mmol/L (ref 135–145)
TOTAL PROTEIN: 7.9 g/dL (ref 6.5–8.1)

## 2015-11-08 NOTE — Progress Notes (Signed)
States is feeling well. Offers no complaints. Had open heart surgery in March 2017 which went well.

## 2015-11-15 ENCOUNTER — Ambulatory Visit: Payer: PPO | Admitting: Psychiatry

## 2015-11-17 ENCOUNTER — Encounter: Payer: Self-pay | Admitting: Psychiatry

## 2015-11-17 ENCOUNTER — Ambulatory Visit (INDEPENDENT_AMBULATORY_CARE_PROVIDER_SITE_OTHER): Payer: PPO | Admitting: Psychiatry

## 2015-11-17 VITALS — BP 122/75 | HR 64 | Temp 97.6°F | Wt 218.0 lb

## 2015-11-17 DIAGNOSIS — F39 Unspecified mood [affective] disorder: Secondary | ICD-10-CM

## 2015-11-17 NOTE — Progress Notes (Signed)
BH MD/PA/NP OP Progress Note  11/17/2015 10:29 AM Tonya Walton  MRN:  [id number]  Subjective:  Pt is a 65 year old married female who presented for the follow-up. She appeared tired during the interview. She reported that she has been stressed out as she found out 6 weeks ago that her husband has been charged with third degree in child pornography. She reported that he has history of sexual addiction but somebody sent him the pictures and he was unable to unable to delete  them. The patient is to stressed out and she is trying to support him. She reported that he does not have any history of child porn addiction.  Patient reported that she has been compliant with her medications. She reported that she did well after the dose of the medications was decreased. Some nights she will sleep well and other nights she is stressed out. She currently denied having any perceptual disturbances. She currently denied having any suicidal homicidal ideations or plans.      Chief Complaint:  Chief Complaint    Follow-up; Medication Refill     Visit Diagnosis:     ICD-9-CM ICD-10-CM   1. Episodic mood disorder ([place]) 296.90 F39     Past Medical History:  Past Medical History:  Diagnosis Date  . Anxiety   . Arthritis   . Atrial fibrillation, controlled ([place])   . Cancer [facility])    right breast  . CHF (congestive heart failure) ([place])   . COPD (chronic obstructive pulmonary disease) ([place])   . Coronary artery disease   . Depression   . Dysrhythmia    Afib  . GERD (gastroesophageal reflux disease)   . Headache   . Hypertension   . Hypothyroidism   . Neuromuscular disorder ([place])    nerve damage in legs  . Restless leg syndrome   . Shortness of breath dyspnea     Past Surgical History:  Procedure Laterality Date  . APPENDECTOMY    . BREAST BIOPSY Left 07/2013   -  . BREAST EXCISIONAL BIOPSY Right 2012   +  . BREAST LUMPECTOMY     Right breast  . CARDIAC CATHETERIZATION N/A 03/02/2015    Procedure: Left Heart Cath and Coronary Angiography;  Surgeon: Teodoro Spray, MD;  Location: [facility];  Service: Cardiovascular;  Laterality: N/A;  . CATARACT EXTRACTION W/PHACO Right 10/14/2014   Procedure: CATARACT EXTRACTION PHACO AND INTRAOCULAR LENS PLACEMENT (IOC);  Surgeon: Leandrew Koyanagi, MD;  Location: [place];  Service: Ophthalmology;  Laterality: Right;  . CORONARY ARTERY BYPASS GRAFT N/A 04/01/2015   Procedure: CORONARY ARTERY BYPASS GRAFTING (CABG) x one , using left mammary artery;  Surgeon: Ivin Poot, MD;  Location: [place];  Service: Open Heart Surgery;  Laterality: N/A;  . JOINT REPLACEMENT Right    Total Knee Replacement  . TEE WITHOUT CARDIOVERSION N/A 04/01/2015   Procedure: TRANSESOPHAGEAL ECHOCARDIOGRAM (TEE);  Surgeon: Ivin Poot, MD;  Location: [place];  Service: Open Heart Surgery;  Laterality: N/A;  . TONSILLECTOMY    . TOTAL KNEE ARTHROPLASTY Right 06/10/2014   Procedure: TOTAL KNEE ARTHROPLASTY;  Surgeon: Dereck Leep, MD;  Location: [facility];  Service: Orthopedics;  Laterality: Right;  . TOTAL KNEE ARTHROPLASTY Left 09/02/2014   Procedure: TOTAL KNEE ARTHROPLASTY;  Surgeon: Dereck Leep, MD;  Location: [facility];  Service: Orthopedics;  Laterality: Left;   Family History:  Family History  Problem Relation Age of Onset  . Leukemia Mother   .  Bone cancer Father   . Heart disease Brother   . Heart disease Brother   . Diabetes Brother   . Hypertension Brother   . Diabetes Brother   . Hypertension Brother   . Heart disease Brother    Social History:  Social History   Social History  . Marital status: Married    Spouse name: N/A  . Number of children: N/A  . Years of education: N/A   Social History Main Topics  . Smoking status: Former Smoker    Packs/day: 1.00    Types: Cigarettes    Quit date: 04/30/1992  . Smokeless tobacco: Never Used  . Alcohol use No  . Drug use: No  . Sexual activity: No   Other Topics Concern   . None   Social History Narrative  . None   Additional History:  She currently lives with her husband who is very helpful and has been taking care of her during her surgery. She reported that he is a good  nurse  Assessment:   Musculoskeletal: Strength & Muscle Tone: within normal limits Gait & Station: normal Patient leans: N/A  Psychiatric Specialty Exam: Medication Refill  Associated symptoms include chest pain. Pertinent negatives include no chills, congestion, nausea or rash.  Depression         Associated symptoms include does not have insomnia and no suicidal ideas.   Review of Systems  Constitutional: Negative for chills.  HENT: Negative for congestion and tinnitus.   Eyes: Negative for double vision.  Respiratory: Negative for hemoptysis.   Cardiovascular: Positive for chest pain. Negative for palpitations.  Gastrointestinal: Positive for diarrhea. Negative for nausea.  Genitourinary: Negative for urgency.  Musculoskeletal: Positive for joint pain. Negative for back pain.  Skin: Negative for rash.  Neurological: Negative for sensory change.  Endo/Heme/Allergies: Negative for environmental allergies.  Psychiatric/Behavioral: Positive for depression. Negative for substance abuse and suicidal ideas. The patient does not have insomnia.     Blood pressure 122/75, pulse 64, temperature 97.6 F (36.4 C), temperature source Oral, weight 218 lb (98.9 kg).Body mass index is 35.19 kg/m.  General Appearance: Casual  Eye Contact:  Fair  Speech:  Normal Rate  Volume:  Normal  Mood:  Euthymic  Affect:  Appropriate  Thought Process:  Coherent  Orientation:  Full (Time, Place, and Person)  Thought Content:  WDL  Suicidal Thoughts:  No  Homicidal Thoughts:  No  Memory:  Immediate;   Fair  Judgement:  Fair  Insight:  Fair  Psychomotor Activity:  Normal  Concentration:  Fair  Recall:  [employer] of Knowledge: Fair  Language: Fair  Akathisia:  No  Handed:  Right  AIMS  (if indicated):  none  Assets:  Communication Skills Desire for Improvement Social Support  ADL's:  Intact  Cognition: WNL  Sleep:  8-10   Is the patient at risk to self?  No. Has the patient been a risk to self in the past 6 months?  No. Has the patient been a risk to self within the distant past?  No. Is the patient a risk to others?  No. Has the patient been a risk to others in the past 6 months?  No. Has the patient been a risk to others within the distant past?  No.  Current Medications: Current Outpatient Prescriptions  Medication Sig Dispense Refill  . anastrozole (ARIMIDEX) 1 MG tablet Take 1 mg by mouth daily.     . Artificial Tear Ointment (DRY EYES OP)  Place 1 drop into both eyes at bedtime.    Marland Kitchen aspirin EC 325 MG EC tablet Take 1 tablet (325 mg total) by mouth daily. 30 tablet 0  . atorvastatin (LIPITOR) 40 MG tablet Take 1 tablet (40 mg total) by mouth daily at 6 PM. 30 tablet 3  . clonazePAM (KLONOPIN) 0.5 MG tablet Take 0.5 tablets (0.25 mg total) by mouth at bedtime. 15 tablet 2  . Cyanocobalamin (B-12 COMPLIANCE INJECTION) 1000 MCG/ML KIT Inject 1,000 mcg as directed every Monday, Wednesday, and Friday.     . Cyanocobalamin ([employer] B-12) 1000 MCG/15ML LIQD Take 1,000 mcg by mouth every Monday, Wednesday, and Friday. Rotates weekly with the injection    . cyclobenzaprine (FLEXERIL) 10 MG tablet Take 10 mg by mouth daily.     Marland Kitchen esomeprazole (NEXIUM) 40 MG capsule Take by mouth.    . ferrous [id number] C-folic acid (TRINSICON / FOLTRIN) capsule Take 1 capsule by mouth 3 (three) times daily after meals. 90 capsule 3  . FLUoxetine (PROZAC) 40 MG capsule Take 1 capsule (40 mg total) by mouth daily. 90 capsule 1  . furosemide (LASIX) 40 MG tablet Take 40 mg by mouth daily as needed for fluid.     Marland Kitchen gabapentin (NEURONTIN) 300 MG capsule Take 1,500 mg by mouth 2 (two) times daily. 1500 mg AM, 1500 mg PM    . lamoTRIgine (LAMICTAL) 25 MG tablet Take 2 tablets (50 mg  total) by mouth daily. 180 tablet 1  . levothyroxine (SYNTHROID, LEVOTHROID) 25 MCG tablet Take 25 mcg by mouth daily.     Marland Kitchen lisinopril (PRINIVIL,ZESTRIL) 5 MG tablet Take 1 tablet (5 mg total) by mouth daily. 30 tablet 3  . meloxicam (MOBIC) 15 MG tablet     . metoprolol tartrate (LOPRESSOR) 25 MG tablet Take 0.5 tablets (12.5 mg total) by mouth 2 (two) times daily. 60 tablet 3  . montelukast (SINGULAIR) 10 MG tablet Take 10 mg by mouth at bedtime.    Marland Kitchen nystatin cream (MYCOSTATIN) Apply topically.    Marland Kitchen oxybutynin (DITROPAN) 5 MG tablet Take 5 mg by mouth 3 (three) times daily as needed for bladder spasms.    . pantoprazole (PROTONIX) 40 MG tablet Take 40 mg by mouth daily. AM    . potassium chloride SA (K-DUR,KLOR-CON) 20 MEQ tablet Take 1 tablet (20 mEq total) by mouth daily. If taking Lasix 30 tablet 3  . pramipexole (MIRAPEX) 0.25 MG tablet Take 0.25-0.5 mg by mouth 3 (three) times daily. Pt takes two tablets in the morning, one tablet at noon, and two tablets at bedtime.    Marland Kitchen zolpidem (AMBIEN) 5 MG tablet Take 1 tablet (5 mg total) by mouth at bedtime. 90 tablet 1   No current facility-administered medications for this visit.     Medical Decision Making:  Established Problem, Stable/Improving (1) and Review of Last Therapy Session (1)  Treatment Plan Summary:Medication management  Depression Continue on Prozac 40 mg in the morning- 90 day supply given  Continue  lamotrigine 50  mg daily for her mood stabilization and discuss about the risk of rash and Katherina Right syndrome.  Anxiety Advised patient to start taking Klonopin 0.25  mg at bed. She is also taking zolpidem at bedtime.  Patient has enough supply of her medications at this time.    Follow-up She will follow-up in 1 months   Advised patient that I will be leaving this practice in the end of November and she demonstrated understanding. She would like to  come back in next month for follow-up.      More than 50% of  the time spent in psychoeducation, counseling and coordination of care.     This note was generated in part or whole with voice recognition software. Voice regonition is usually quite accurate but there are transcription errors that can and very often do occur. I apologize for any typographical errors that were not detected and corrected.    Rainey Pines, MD  11/17/2015, 10:29 AM

## 2015-12-14 ENCOUNTER — Ambulatory Visit
Admission: RE | Admit: 2015-12-14 | Discharge: 2015-12-14 | Disposition: A | Discharge status: Home / Self Care | Payer: PPO | Source: Ambulatory Visit | Attending: Oncology | Admitting: Oncology

## 2015-12-14 DIAGNOSIS — C50911 Malignant neoplasm of unspecified site of right female breast: Secondary | ICD-10-CM

## 2015-12-14 DIAGNOSIS — Z853 Personal history of malignant neoplasm of breast: Secondary | ICD-10-CM | POA: Diagnosis not present

## 2015-12-14 DIAGNOSIS — Z780 Asymptomatic menopausal state: Secondary | ICD-10-CM | POA: Diagnosis not present

## 2015-12-14 DIAGNOSIS — M85851 Other specified disorders of bone density and structure, right thigh: Secondary | ICD-10-CM | POA: Insufficient documentation

## 2015-12-14 DIAGNOSIS — R928 Other abnormal and inconclusive findings on diagnostic imaging of breast: Secondary | ICD-10-CM | POA: Diagnosis not present

## 2015-12-14 HISTORY — DX: Malignant neoplasm of unspecified site of unspecified female breast: C50.919

## 2015-12-14 HISTORY — DX: Personal history of irradiation: Z92.3

## 2015-12-16 ENCOUNTER — Ambulatory Visit (INDEPENDENT_AMBULATORY_CARE_PROVIDER_SITE_OTHER): Payer: PPO | Admitting: Psychiatry

## 2015-12-16 ENCOUNTER — Encounter: Payer: Self-pay | Admitting: Psychiatry

## 2015-12-16 VITALS — BP 125/75 | HR 75 | Temp 98.1°F | Resp 17 | Wt 220.2 lb

## 2015-12-16 DIAGNOSIS — F39 Unspecified mood [affective] disorder: Secondary | ICD-10-CM | POA: Diagnosis not present

## 2015-12-16 MED ORDER — FLUOXETINE HCL 40 MG PO CAPS: 40.0000 mg | ORAL_CAPSULE | Freq: Every day | ORAL | 1 refills | Status: DC

## 2015-12-16 MED ORDER — ZOLPIDEM TARTRATE 5 MG PO TABS: 5.0000 mg | ORAL_TABLET | Freq: Every day | ORAL | 1 refills | Status: DC

## 2015-12-16 MED ORDER — CLONAZEPAM 0.5 MG PO TABS: 0.2500 mg | ORAL_TABLET | Freq: Every day | ORAL | 2 refills | Status: DC

## 2015-12-16 MED ORDER — LAMOTRIGINE 25 MG PO TABS: 50.0000 mg | ORAL_TABLET | Freq: Every day | ORAL | 1 refills | Status: DC

## 2015-12-16 NOTE — Progress Notes (Signed)
BH MD/PA/NP OP Progress Note  12/16/2015 8:51 AM Tonya Walton  MRN:  [id number]  Subjective:  Pt is a 65 year old married female who presented for the follow-up. She appeared a little during the interview. She reported that she is sleeping longer now and is interested in decreasing the dose of her sleeping aids at this time. She reported that she has a psychiatrist for her husband and is happy about the same. We discussed that at length. She reported that she is helping him at this time. She stated that he has a court date in January. Patient reported that she is compliant with her medications and her mood symptoms are improving .patient currently denied having any suicidal ideations or plans. She denied having any perceptual disturbances. We discussed about her medications in detail and she is not taking Klonopin and zolpidem on a regular basis.     Patient reported that she has been compliant with her medications. She reported that she did well after the dose of the medications was decreased. Some nights she will sleep well and other nights she is stressed out. She currently denied having any perceptual disturbances. She currently denied having any suicidal homicidal ideations or plans.      Chief Complaint:  Chief Complaint    Follow-up     Visit Diagnosis:   No diagnosis found.  Past Medical History:  Past Medical History:  Diagnosis Date  . Anxiety   . Arthritis   . Atrial fibrillation, controlled ([place])   . Breast cancer ([place]) 2012   RT LUMPECTOMY  . CHF (congestive heart failure) ([place])   . COPD (chronic obstructive pulmonary disease) ([place])   . Coronary artery disease   . Depression   . Dysrhythmia    Afib  . GERD (gastroesophageal reflux disease)   . Headache   . Hypertension   . Hypothyroidism   . Neuromuscular disorder ([place])    nerve damage in legs  . Personal history of radiation therapy 2012   BREAST CA  . Restless leg syndrome   . Shortness of breath dyspnea      Past Surgical History:  Procedure Laterality Date  . APPENDECTOMY    . BREAST BIOPSY Left 07/2013   NEG  . BREAST EXCISIONAL BIOPSY Right 2012   POS  . BREAST LUMPECTOMY     Right breast  . CARDIAC CATHETERIZATION N/A 03/02/2015   Procedure: Left Heart Cath and Coronary Angiography;  Surgeon: Teodoro Spray, MD;  Location: [facility];  Service: Cardiovascular;  Laterality: N/A;  . CATARACT EXTRACTION W/PHACO Right 10/14/2014   Procedure: CATARACT EXTRACTION PHACO AND INTRAOCULAR LENS PLACEMENT (IOC);  Surgeon: Leandrew Koyanagi, MD;  Location: [facility];  Service: Ophthalmology;  Laterality: Right;  . CORONARY ARTERY BYPASS GRAFT N/A 04/01/2015   Procedure: CORONARY ARTERY BYPASS GRAFTING (CABG) x one , using left mammary artery;  Surgeon: Ivin Poot, MD;  Location: [facility];  Service: Open Heart Surgery;  Laterality: N/A;  . JOINT REPLACEMENT Right    Total Knee Replacement  . TEE WITHOUT CARDIOVERSION N/A 04/01/2015   Procedure: TRANSESOPHAGEAL ECHOCARDIOGRAM (TEE);  Surgeon: Ivin Poot, MD;  Location: [facility];  Service: Open Heart Surgery;  Laterality: N/A;  . TONSILLECTOMY    . TOTAL KNEE ARTHROPLASTY Right 06/10/2014   Procedure: TOTAL KNEE ARTHROPLASTY;  Surgeon: Dereck Leep, MD;  Location: [facility];  Service: Orthopedics;  Laterality: Right;  . TOTAL KNEE ARTHROPLASTY Left 09/02/2014   Procedure: TOTAL KNEE ARTHROPLASTY;  Surgeon: Dereck Leep, MD;  Location: [facility];  Service: Orthopedics;  Laterality: Left;   Family History:  Family History  Problem Relation Age of Onset  . Leukemia Mother   . Bone cancer Father   . Heart disease Brother   . Heart disease Brother   . Diabetes Brother   . Hypertension Brother   . Diabetes Brother   . Hypertension Brother   . Heart disease Brother   . Breast cancer Paternal Aunt    Social History:  Social History   Social History  . Marital status: Married    Spouse name: N/A  . Number of children: N/A   . Years of education: N/A   Social History Main Topics  . Smoking status: Former Smoker    Packs/day: 1.00    Types: Cigarettes    Quit date: 04/30/1992  . Smokeless tobacco: Never Used  . Alcohol use No  . Drug use: No  . Sexual activity: No   Other Topics Concern  . None   Social History Narrative  . None   Additional History:  She currently lives with her husband who is very helpful and has been taking care of her during her surgery. She reported that he is a good  nurse  Assessment:   Musculoskeletal: Strength & Muscle Tone: within normal limits Gait & Station: normal Patient leans: N/A  Psychiatric Specialty Exam: Medication Refill  Associated symptoms include chest pain. Pertinent negatives include no chills, congestion, nausea or rash.  Depression         Associated symptoms include does not have insomnia and no suicidal ideas.   Review of Systems  Constitutional: Negative for chills.  HENT: Negative for congestion and tinnitus.   Eyes: Negative for double vision.  Respiratory: Negative for hemoptysis.   Cardiovascular: Positive for chest pain. Negative for palpitations.  Gastrointestinal: Positive for diarrhea. Negative for nausea.  Genitourinary: Negative for urgency.  Musculoskeletal: Positive for joint pain. Negative for back pain.  Skin: Negative for rash.  Neurological: Negative for sensory change.  Endo/Heme/Allergies: Negative for environmental allergies.  Psychiatric/Behavioral: Positive for depression. Negative for substance abuse and suicidal ideas. The patient does not have insomnia.     Blood pressure 125/75, pulse 75, temperature 98.1 F (36.7 C), temperature source Tympanic, resp. rate 17, weight 220 lb 3.2 oz (99.9 kg), SpO2 96 %.Body mass index is 35.54 kg/m.  General Appearance: Casual  Eye Contact:  Fair  Speech:  Normal Rate  Volume:  Normal  Mood:  Euthymic  Affect:  Appropriate  Thought Process:  Coherent  Orientation:  Full  (Time, Place, and Person)  Thought Content:  WDL  Suicidal Thoughts:  No  Homicidal Thoughts:  No  Memory:  Immediate;   Fair  Judgement:  Fair  Insight:  Fair  Psychomotor Activity:  Normal  Concentration:  Fair  Recall:  [employer] of Knowledge: Fair  Language: Fair  Akathisia:  No  Handed:  Right  AIMS (if indicated):  none  Assets:  Communication Skills Desire for Improvement Social Support  ADL's:  Intact  Cognition: WNL  Sleep:  8-10   Is the patient at risk to self?  No. Has the patient been a risk to self in the past 6 months?  No. Has the patient been a risk to self within the distant past?  No. Is the patient a risk to others?  No. Has the patient been a risk to others in the past 6 months?  No. Has the patient been a risk to others within the distant past?  No.  Current Medications: Current Outpatient Prescriptions  Medication Sig Dispense Refill  . anastrozole (ARIMIDEX) 1 MG tablet Take 1 mg by mouth daily.     . Artificial Tear Ointment (DRY EYES OP) Place 1 drop into both eyes at bedtime.    Marland Kitchen aspirin EC 325 MG EC tablet Take 1 tablet (325 mg total) by mouth daily. 30 tablet 0  . atorvastatin (LIPITOR) 40 MG tablet Take 1 tablet (40 mg total) by mouth daily at 6 PM. 30 tablet 3  . clonazePAM (KLONOPIN) 0.5 MG tablet Take 0.5 tablets (0.25 mg total) by mouth at bedtime. 15 tablet 2  . Cyanocobalamin (B-12 COMPLIANCE INJECTION) 1000 MCG/ML KIT Inject 1,000 mcg as directed every Monday, Wednesday, and Friday.     . Cyanocobalamin ([employer] B-12) 1000 MCG/15ML LIQD Take 1,000 mcg by mouth every Monday, Wednesday, and Friday. Rotates weekly with the injection    . cyclobenzaprine (FLEXERIL) 10 MG tablet Take 10 mg by mouth daily.     Marland Kitchen esomeprazole (NEXIUM) 40 MG capsule Take by mouth.    . ferrous [id number] C-folic acid (TRINSICON / FOLTRIN) capsule Take 1 capsule by mouth 3 (three) times daily after meals. 90 capsule 3  . FLUoxetine (PROZAC) 40 MG capsule  Take 1 capsule (40 mg total) by mouth daily. 90 capsule 1  . furosemide (LASIX) 40 MG tablet Take 40 mg by mouth daily as needed for fluid.     Marland Kitchen gabapentin (NEURONTIN) 300 MG capsule Take 1,500 mg by mouth 2 (two) times daily. 1500 mg AM, 1500 mg PM    . lamoTRIgine (LAMICTAL) 25 MG tablet Take 2 tablets (50 mg total) by mouth daily. 180 tablet 1  . levothyroxine (SYNTHROID, LEVOTHROID) 25 MCG tablet Take 25 mcg by mouth daily.     Marland Kitchen lisinopril (PRINIVIL,ZESTRIL) 5 MG tablet Take 1 tablet (5 mg total) by mouth daily. 30 tablet 3  . meloxicam (MOBIC) 15 MG tablet     . metoprolol tartrate (LOPRESSOR) 25 MG tablet Take 0.5 tablets (12.5 mg total) by mouth 2 (two) times daily. 60 tablet 3  . montelukast (SINGULAIR) 10 MG tablet Take 10 mg by mouth at bedtime.    Marland Kitchen nystatin cream (MYCOSTATIN) Apply topically.    Marland Kitchen oxybutynin (DITROPAN) 5 MG tablet Take 5 mg by mouth 3 (three) times daily as needed for bladder spasms.    . pantoprazole (PROTONIX) 40 MG tablet Take 40 mg by mouth daily. AM    . potassium chloride SA (K-DUR,KLOR-CON) 20 MEQ tablet Take 1 tablet (20 mEq total) by mouth daily. If taking Lasix 30 tablet 3  . pramipexole (MIRAPEX) 0.25 MG tablet Take 0.25-0.5 mg by mouth 3 (three) times daily. Pt takes two tablets in the morning, one tablet at noon, and two tablets at bedtime.    Marland Kitchen zolpidem (AMBIEN) 5 MG tablet Take 1 tablet (5 mg total) by mouth at bedtime. 90 tablet 1   No current facility-administered medications for this visit.     Medical Decision Making:  Established Problem, Stable/Improving (1) and Review of Last Therapy Session (1)  Treatment Plan Summary:Medication management  Depression Continue on Prozac 40 mg in the morning- 90 day supply given  Continue  lamotrigine 50  mg daily for her mood stabilization and discuss about the risk of rash and Katherina Right syndrome.  Anxiety Advised patient to decrease  Klonopin 0.25  mg at bed. She is  also taking zolpidem when  necessary basis. Patient has enough supply of her medications at this time.    Follow-up She will follow-up in 2 months       More than 50% of the time spent in psychoeducation, counseling and coordination of care.     This note was generated in part or whole with voice recognition software. Voice regonition is usually quite accurate but there are transcription errors that can and very often do occur. I apologize for any typographical errors that were not detected and corrected.    Rainey Pines, MD  12/16/2015, 8:51 AM

## 2015-12-29 DIAGNOSIS — J0100 Acute maxillary sinusitis, unspecified: Secondary | ICD-10-CM | POA: Diagnosis not present

## 2016-01-14 DIAGNOSIS — J0100 Acute maxillary sinusitis, unspecified: Secondary | ICD-10-CM | POA: Diagnosis not present

## 2016-02-04 DIAGNOSIS — M659 Synovitis and tenosynovitis, unspecified: Secondary | ICD-10-CM | POA: Diagnosis not present

## 2016-02-04 DIAGNOSIS — S46002A Unspecified injury of muscle(s) and tendon(s) of the rotator cuff of left shoulder, initial encounter: Secondary | ICD-10-CM | POA: Diagnosis not present

## 2016-02-15 ENCOUNTER — Ambulatory Visit: Payer: PPO | Admitting: Psychiatry

## 2016-02-24 DIAGNOSIS — I480 Paroxysmal atrial fibrillation: Secondary | ICD-10-CM | POA: Diagnosis not present

## 2016-02-24 DIAGNOSIS — Z0000 Encounter for general adult medical examination without abnormal findings: Secondary | ICD-10-CM | POA: Insufficient documentation

## 2016-02-24 DIAGNOSIS — D510 Vitamin B12 deficiency anemia due to intrinsic factor deficiency: Secondary | ICD-10-CM | POA: Diagnosis not present

## 2016-02-24 DIAGNOSIS — R7309 Other abnormal glucose: Secondary | ICD-10-CM | POA: Diagnosis not present

## 2016-02-24 DIAGNOSIS — J431 Panlobular emphysema: Secondary | ICD-10-CM | POA: Diagnosis not present

## 2016-03-15 ENCOUNTER — Ambulatory Visit (INDEPENDENT_AMBULATORY_CARE_PROVIDER_SITE_OTHER): Payer: PPO | Admitting: Psychiatry

## 2016-03-15 ENCOUNTER — Encounter: Payer: Self-pay | Admitting: Psychiatry

## 2016-03-15 VITALS — Temp 98.3°F | Wt 226.2 lb

## 2016-03-15 DIAGNOSIS — F39 Unspecified mood [affective] disorder: Secondary | ICD-10-CM | POA: Diagnosis not present

## 2016-03-15 MED ORDER — FLUOXETINE HCL 40 MG PO CAPS: 40.0000 mg | ORAL_CAPSULE | Freq: Every day | ORAL | 1 refills | Status: DC

## 2016-03-15 MED ORDER — LAMOTRIGINE 25 MG PO TABS: 50.0000 mg | ORAL_TABLET | Freq: Every day | ORAL | 1 refills | Status: DC

## 2016-03-15 MED ORDER — FUROSEMIDE 40 MG PO TABS: 40.0000 mg | ORAL_TABLET | Freq: Every day | ORAL | 2 refills | Status: DC | PRN

## 2016-03-15 MED ORDER — CLONAZEPAM 0.5 MG PO TABS: 0.2500 mg | ORAL_TABLET | Freq: Every day | ORAL | 3 refills | Status: DC

## 2016-03-15 NOTE — Progress Notes (Signed)
BH MD/PA/NP OP Progress Note  03/15/2016 2:54 PM Tonya Walton  MRN:  [id number]  Subjective:  Pt is a 66 year old married female who presented for the follow-up. She appeared calm during the interview. She reported that she has been compliant with her medications. She reported that she takes Klonopin half pill at night to help her sleep. Patient reported that she went for the Valentine's Day lunch with her friends. Her husband has started the exercise program with her and they go for exercise at least 3 times per week. Patient appeared calm and alert during the interview. She currently denied having any side effects of the medications. We discussed about her medications and she is compliant with them. She denied having any side effects. She reported that she also takes gabapentin to help with the neuropathy. She is not taking zolpidem at this time and will discontinue the medication.  Her mood is calm and she does not have any acute mood symptoms anxiety or paranoia.   She currently denied having any suicidal homicidal ideations or plans.      Chief Complaint:  Chief Complaint    Follow-up; Medication Refill     Visit Diagnosis:     ICD-9-CM ICD-10-CM   1. Episodic mood disorder ([employer]) 296.90 F39     Past Medical History:  Past Medical History:  Diagnosis Date  . Anxiety   . Arthritis   . Atrial fibrillation, controlled ([place])   . Breast cancer ([place]) 2012   RT LUMPECTOMY  . CHF (congestive heart failure) ([place])   . COPD (chronic obstructive pulmonary disease) ([place])   . Coronary artery disease   . Depression   . Dysrhythmia    Afib  . GERD (gastroesophageal reflux disease)   . Headache   . Hypertension   . Hypothyroidism   . Neuromuscular disorder ([place])    nerve damage in legs  . Personal history of radiation therapy 2012   BREAST CA  . Restless leg syndrome   . Shortness of breath dyspnea     Past Surgical History:  Procedure Laterality Date  . APPENDECTOMY    .  BREAST BIOPSY Left 07/2013   NEG  . BREAST EXCISIONAL BIOPSY Right 2012   POS  . BREAST LUMPECTOMY     Right breast  . CARDIAC CATHETERIZATION N/A 03/02/2015   Procedure: Left Heart Cath and Coronary Angiography;  Surgeon: Teodoro Spray, MD;  Location: [facility];  Service: Cardiovascular;  Laterality: N/A;  . CATARACT EXTRACTION W/PHACO Right 10/14/2014   Procedure: CATARACT EXTRACTION PHACO AND INTRAOCULAR LENS PLACEMENT (IOC);  Surgeon: Leandrew Koyanagi, MD;  Location: [facility];  Service: Ophthalmology;  Laterality: Right;  . CORONARY ARTERY BYPASS GRAFT N/A 04/01/2015   Procedure: CORONARY ARTERY BYPASS GRAFTING (CABG) x one , using left mammary artery;  Surgeon: Ivin Poot, MD;  Location: [facility];  Service: Open Heart Surgery;  Laterality: N/A;  . JOINT REPLACEMENT Right    Total Knee Replacement  . TEE WITHOUT CARDIOVERSION N/A 04/01/2015   Procedure: TRANSESOPHAGEAL ECHOCARDIOGRAM (TEE);  Surgeon: Ivin Poot, MD;  Location: [facility];  Service: Open Heart Surgery;  Laterality: N/A;  . TONSILLECTOMY    . TOTAL KNEE ARTHROPLASTY Right 06/10/2014   Procedure: TOTAL KNEE ARTHROPLASTY;  Surgeon: Dereck Leep, MD;  Location: [facility];  Service: Orthopedics;  Laterality: Right;  . TOTAL KNEE ARTHROPLASTY Left 09/02/2014   Procedure: TOTAL KNEE ARTHROPLASTY;  Surgeon: Dereck Leep, MD;  Location: [facility];  Service: Orthopedics;  Laterality: Left;   Family History:  Family History  Problem Relation Age of Onset  . Leukemia Mother   . Bone cancer Father   . Heart disease Brother   . Heart disease Brother   . Diabetes Brother   . Hypertension Brother   . Diabetes Brother   . Hypertension Brother   . Heart disease Brother   . Breast cancer Paternal Aunt    Social History:  Social History   Social History  . Marital status: Married    Spouse name: N/A  . Number of children: N/A  . Years of education: N/A   Social History Main Topics  . Smoking status:  Former Smoker    Packs/day: 1.00    Types: Cigarettes    Quit date: 04/30/1992  . Smokeless tobacco: Never Used  . Alcohol use No  . Drug use: No  . Sexual activity: No   Other Topics Concern  . None   Social History Narrative  . None   Additional History:  She currently lives with her husband who is very helpful and has been taking care of her during her surgery. She reported that he is a good  nurse  Assessment:   Musculoskeletal: Strength & Muscle Tone: within normal limits Gait & Station: normal Patient leans: N/A  Psychiatric Specialty Exam: Medication Refill  Associated symptoms include chest pain. Pertinent negatives include no chills, congestion, nausea or rash.  Depression         Associated symptoms include does not have insomnia and no suicidal ideas.   Review of Systems  Constitutional: Negative for chills.  HENT: Negative for congestion and tinnitus.   Eyes: Negative for double vision.  Respiratory: Negative for hemoptysis.   Cardiovascular: Positive for chest pain. Negative for palpitations.  Gastrointestinal: Positive for diarrhea. Negative for nausea.  Genitourinary: Negative for urgency.  Musculoskeletal: Positive for joint pain. Negative for back pain.  Skin: Negative for rash.  Neurological: Negative for sensory change.  Endo/Heme/Allergies: Negative for environmental allergies.  Psychiatric/Behavioral: Positive for depression. Negative for substance abuse and suicidal ideas. The patient does not have insomnia.     Temperature 98.3 F (36.8 C), temperature source Oral, weight 226 lb 3.2 oz (102.6 kg).Body mass index is 36.51 kg/m.  General Appearance: Casual  Eye Contact:  Fair  Speech:  Normal Rate  Volume:  Normal  Mood:  Euthymic  Affect:  Appropriate  Thought Process:  Coherent  Orientation:  Full (Time, Place, and Person)  Thought Content:  WDL  Suicidal Thoughts:  No  Homicidal Thoughts:  No  Memory:  Immediate;   Fair  Judgement:   Fair  Insight:  Fair  Psychomotor Activity:  Normal  Concentration:  Fair  Recall:  [employer] of Knowledge: Fair  Language: Fair  Akathisia:  No  Handed:  Right  AIMS (if indicated):  none  Assets:  Communication Skills Desire for Improvement Social Support  ADL's:  Intact  Cognition: WNL  Sleep:  8-10   Is the patient at risk to self?  No. Has the patient been a risk to self in the past 6 months?  No. Has the patient been a risk to self within the distant past?  No. Is the patient a risk to others?  No. Has the patient been a risk to others in the past 6 months?  No. Has the patient been a risk to others within the distant past?  No.  Current Medications: Current Outpatient Prescriptions  Medication Sig Dispense Refill  . anastrozole (ARIMIDEX) 1 MG tablet Take 1 mg by mouth daily.     . Artificial Tear Ointment (DRY EYES OP) Place 1 drop into both eyes at bedtime.    Marland Kitchen aspirin EC 325 MG EC tablet Take 1 tablet (325 mg total) by mouth daily. 30 tablet 0  . atorvastatin (LIPITOR) 40 MG tablet Take 1 tablet (40 mg total) by mouth daily at 6 PM. 30 tablet 3  . clonazePAM (KLONOPIN) 0.5 MG tablet Take 0.5 tablets (0.25 mg total) by mouth at bedtime. 15 tablet 3  . Cyanocobalamin (B-12 COMPLIANCE INJECTION) 1000 MCG/ML KIT Inject 1,000 mcg as directed every Monday, Wednesday, and Friday.     . Cyanocobalamin ([employer] B-12) 1000 MCG/15ML LIQD Take 1,000 mcg by mouth every Monday, Wednesday, and Friday. Rotates weekly with the injection    . cyclobenzaprine (FLEXERIL) 10 MG tablet Take 10 mg by mouth daily.     Marland Kitchen esomeprazole (NEXIUM) 40 MG capsule Take by mouth.    . ferrous [id number] C-folic acid (TRINSICON / FOLTRIN) capsule Take 1 capsule by mouth 3 (three) times daily after meals. 90 capsule 3  . FLUoxetine (PROZAC) 40 MG capsule Take 1 capsule (40 mg total) by mouth daily. 90 capsule 1  . furosemide (LASIX) 40 MG tablet Take 1 tablet (40 mg total) by mouth daily as  needed for fluid. 30 tablet 2  . gabapentin (NEURONTIN) 300 MG capsule Take 1,500 mg by mouth 2 (two) times daily. 1500 mg AM, 1500 mg PM    . lamoTRIgine (LAMICTAL) 25 MG tablet Take 2 tablets (50 mg total) by mouth daily. 180 tablet 1  . levothyroxine (SYNTHROID, LEVOTHROID) 25 MCG tablet Take 25 mcg by mouth daily.     Marland Kitchen lisinopril (PRINIVIL,ZESTRIL) 5 MG tablet Take 1 tablet (5 mg total) by mouth daily. 30 tablet 3  . meloxicam (MOBIC) 15 MG tablet     . metoprolol tartrate (LOPRESSOR) 25 MG tablet Take 0.5 tablets (12.5 mg total) by mouth 2 (two) times daily. 60 tablet 3  . montelukast (SINGULAIR) 10 MG tablet Take 10 mg by mouth at bedtime.    Marland Kitchen nystatin cream (MYCOSTATIN) Apply topically.    Marland Kitchen oxybutynin (DITROPAN) 5 MG tablet Take 5 mg by mouth 3 (three) times daily as needed for bladder spasms.    . pantoprazole (PROTONIX) 40 MG tablet Take 40 mg by mouth daily. AM    . potassium chloride SA (K-DUR,KLOR-CON) 20 MEQ tablet Take 1 tablet (20 mEq total) by mouth daily. If taking Lasix 30 tablet 3  . pramipexole (MIRAPEX) 0.25 MG tablet Take 0.25-0.5 mg by mouth 3 (three) times daily. Pt takes two tablets in the morning, one tablet at noon, and two tablets at bedtime.     No current facility-administered medications for this visit.     Medical Decision Making:  Established Problem, Stable/Improving (1) and Review of Last Therapy Session (1)  Treatment Plan Summary:Medication management  Depression Continue on Prozac 40 mg in the morning- 90 day supply given  Continue  lamotrigine 50  mg daily for her mood stabilization and discuss about the risk of rash and Katherina Right syndrome.  Anxiety Advised patient to decrease  Klonopin 0.25  mg at bed.  Follow-up She will follow-up in 3 months    More than 50% of the time spent in psychoeducation, counseling and coordination of care.     This note was generated in part or whole with voice recognition software.  Voice regonition is  usually quite accurate but there are transcription errors that can and very often do occur. I apologize for any typographical errors that were not detected and corrected.    Rainey Pines, MD  03/15/2016, 2:54 PM

## 2016-05-17 ENCOUNTER — Other Ambulatory Visit: Payer: Self-pay | Admitting: Psychiatry

## 2016-06-12 ENCOUNTER — Ambulatory Visit: Payer: PPO | Admitting: Psychiatry

## 2016-06-21 ENCOUNTER — Ambulatory Visit (INDEPENDENT_AMBULATORY_CARE_PROVIDER_SITE_OTHER): Payer: PPO | Admitting: Psychiatry

## 2016-06-21 ENCOUNTER — Ambulatory Visit: Payer: PPO | Admitting: Psychiatry

## 2016-06-21 ENCOUNTER — Encounter: Payer: Self-pay | Admitting: Psychiatry

## 2016-06-21 VITALS — BP 153/77 | HR 79 | Temp 98.8°F | Wt 225.6 lb

## 2016-06-21 DIAGNOSIS — F39 Unspecified mood [affective] disorder: Secondary | ICD-10-CM | POA: Diagnosis not present

## 2016-06-21 MED ORDER — CLONAZEPAM 0.5 MG PO TABS: 0.2500 mg | ORAL_TABLET | Freq: Every day | ORAL | 3 refills | Status: DC

## 2016-06-21 MED ORDER — FLUOXETINE HCL 40 MG PO CAPS: 40.0000 mg | ORAL_CAPSULE | Freq: Every day | ORAL | 1 refills | Status: DC

## 2016-06-21 MED ORDER — LAMOTRIGINE 25 MG PO TABS: 50.0000 mg | ORAL_TABLET | Freq: Every day | ORAL | 1 refills | Status: DC

## 2016-06-21 NOTE — Progress Notes (Signed)
BH MD/PA/NP OP Progress Note  06/21/2016 2:31 PM Tonya Walton  MRN:  [id number]  Subjective:  Pt is a 66 year old married female who presented for the follow-up. She appeared calm during the interview. She reported that she has been compliant with her medications. She was discussing in detail about the issues related to her husband. She reported that she has been having problems with sleep at night as she does not sleep well during the summer. She reported that she is very hot natured and the summer weather causes her to sleep less. Patient is willing to start taking melatonin at night. She currently denied having any suicidal homicidal ideations or plans. She remains pleasant and cooperative during the interview. Patient reported that she has good relationship with her husband. She denied having any perceptual disturbances. She denied having any suicidal homicidal ideations or plans.   Her mood is calm and she does not have any acute mood symptoms anxiety or paranoia.       Chief Complaint:  Chief Complaint    Follow-up; Medication Refill     Visit Diagnosis:     ICD-9-CM ICD-10-CM   1. Episodic mood disorder ([place]) 296.90 F39     Past Medical History:  Past Medical History:  Diagnosis Date  . Anxiety   . Arthritis   . Atrial fibrillation, controlled ([place])   . Breast cancer ([place]) 2012   RT LUMPECTOMY  . CHF (congestive heart failure) ([place])   . COPD (chronic obstructive pulmonary disease) ([place])   . Coronary artery disease   . Depression   . Dysrhythmia    Afib  . GERD (gastroesophageal reflux disease)   . Headache   . Hypertension   . Hypothyroidism   . Neuromuscular disorder ([place])    nerve damage in legs  . Personal history of radiation therapy 2012   BREAST CA  . Restless leg syndrome   . Shortness of breath dyspnea     Past Surgical History:  Procedure Laterality Date  . APPENDECTOMY    . BREAST BIOPSY Left 07/2013   NEG  . BREAST EXCISIONAL BIOPSY Right 2012    POS  . BREAST LUMPECTOMY     Right breast  . CARDIAC CATHETERIZATION N/A 03/02/2015   Procedure: Left Heart Cath and Coronary Angiography;  Surgeon: Teodoro Spray, MD;  Location: [facility];  Service: Cardiovascular;  Laterality: N/A;  . CATARACT EXTRACTION W/PHACO Right 10/14/2014   Procedure: CATARACT EXTRACTION PHACO AND INTRAOCULAR LENS PLACEMENT (IOC);  Surgeon: Leandrew Koyanagi, MD;  Location: [facility];  Service: Ophthalmology;  Laterality: Right;  . CORONARY ARTERY BYPASS GRAFT N/A 04/01/2015   Procedure: CORONARY ARTERY BYPASS GRAFTING (CABG) x one , using left mammary artery;  Surgeon: Ivin Poot, MD;  Location: [facility];  Service: Open Heart Surgery;  Laterality: N/A;  . JOINT REPLACEMENT Right    Total Knee Replacement  . TEE WITHOUT CARDIOVERSION N/A 04/01/2015   Procedure: TRANSESOPHAGEAL ECHOCARDIOGRAM (TEE);  Surgeon: Ivin Poot, MD;  Location: [facility];  Service: Open Heart Surgery;  Laterality: N/A;  . TONSILLECTOMY    . TOTAL KNEE ARTHROPLASTY Right 06/10/2014   Procedure: TOTAL KNEE ARTHROPLASTY;  Surgeon: Dereck Leep, MD;  Location: [facility];  Service: Orthopedics;  Laterality: Right;  . TOTAL KNEE ARTHROPLASTY Left 09/02/2014   Procedure: TOTAL KNEE ARTHROPLASTY;  Surgeon: Dereck Leep, MD;  Location: [facility];  Service: Orthopedics;  Laterality: Left;   Family History:  Family History  Problem  Relation Age of Onset  . Leukemia Mother   . Bone cancer Father   . Heart disease Brother   . Heart disease Brother   . Diabetes Brother   . Hypertension Brother   . Diabetes Brother   . Hypertension Brother   . Heart disease Brother   . Breast cancer Paternal Aunt    Social History:  Social History   Social History  . Marital status: Married    Spouse name: N/A  . Number of children: N/A  . Years of education: N/A   Social History Main Topics  . Smoking status: Former Smoker    Packs/day: 1.00    Types: Cigarettes    Quit date:  04/30/1992  . Smokeless tobacco: Never Used  . Alcohol use No  . Drug use: No  . Sexual activity: No   Other Topics Concern  . None   Social History Narrative  . None   Additional History:  She currently lives with her husband who is very helpful and has been taking care of her during her surgery. She reported that he is a good  nurse  Assessment:   Musculoskeletal: Strength & Muscle Tone: within normal limits Gait & Station: normal Patient leans: N/A  Psychiatric Specialty Exam: Medication Refill  Associated symptoms include chest pain. Pertinent negatives include no chills, congestion, nausea or rash.  Depression         Associated symptoms include does not have insomnia and no suicidal ideas.   Review of Systems  Constitutional: Negative for chills.  HENT: Negative for congestion and tinnitus.   Eyes: Negative for double vision.  Respiratory: Negative for hemoptysis.   Cardiovascular: Positive for chest pain. Negative for palpitations.  Gastrointestinal: Positive for diarrhea. Negative for nausea.  Genitourinary: Negative for urgency.  Musculoskeletal: Positive for joint pain. Negative for back pain.  Skin: Negative for rash.  Neurological: Negative for sensory change.  Endo/Heme/Allergies: Negative for environmental allergies.  Psychiatric/Behavioral: Positive for depression. Negative for substance abuse and suicidal ideas. The patient does not have insomnia.     Blood pressure (!) 153/77, pulse 79, temperature 98.8 F (37.1 C), temperature source Oral, weight 225 lb 9.6 oz (102.3 kg).Body mass index is 36.41 kg/m.  General Appearance: Casual  Eye Contact:  Fair  Speech:  Normal Rate  Volume:  Normal  Mood:  Euthymic  Affect:  Appropriate  Thought Process:  Coherent  Orientation:  Full (Time, Place, and Person)  Thought Content:  WDL  Suicidal Thoughts:  No  Homicidal Thoughts:  No  Memory:  Immediate;   Fair  Judgement:  Fair  Insight:  Fair  Psychomotor  Activity:  Normal  Concentration:  Fair  Recall:  [employer] of Knowledge: Fair  Language: Fair  Akathisia:  No  Handed:  Right  AIMS (if indicated):  none  Assets:  Communication Skills Desire for Improvement Social Support  ADL's:  Intact  Cognition: WNL  Sleep:  8-10   Is the patient at risk to self?  No. Has the patient been a risk to self in the past 6 months?  No. Has the patient been a risk to self within the distant past?  No. Is the patient a risk to others?  No. Has the patient been a risk to others in the past 6 months?  No. Has the patient been a risk to others within the distant past?  No.  Current Medications: Current Outpatient Prescriptions  Medication Sig Dispense Refill  . anastrozole (  ARIMIDEX) 1 MG tablet Take 1 mg by mouth daily.     . Artificial Tear Ointment (DRY EYES OP) Place 1 drop into both eyes at bedtime.    Marland Kitchen aspirin EC 325 MG EC tablet Take 1 tablet (325 mg total) by mouth daily. 30 tablet 0  . atorvastatin (LIPITOR) 40 MG tablet Take 1 tablet (40 mg total) by mouth daily at 6 PM. 30 tablet 3  . clonazePAM (KLONOPIN) 0.5 MG tablet Take 0.5 tablets (0.25 mg total) by mouth at bedtime. 15 tablet 3  . Cyanocobalamin (B-12 COMPLIANCE INJECTION) 1000 MCG/ML KIT Inject 1,000 mcg as directed every Monday, Wednesday, and Friday.     . Cyanocobalamin ([employer] B-12) 1000 MCG/15ML LIQD Take 1,000 mcg by mouth every Monday, Wednesday, and Friday. Rotates weekly with the injection    . cyclobenzaprine (FLEXERIL) 10 MG tablet Take 10 mg by mouth daily.     Marland Kitchen esomeprazole (NEXIUM) 40 MG capsule Take by mouth.    . ferrous [id number] C-folic acid (TRINSICON / FOLTRIN) capsule Take 1 capsule by mouth 3 (three) times daily after meals. 90 capsule 3  . FLUoxetine (PROZAC) 40 MG capsule Take 1 capsule (40 mg total) by mouth daily. 90 capsule 1  . furosemide (LASIX) 40 MG tablet Take 1 tablet (40 mg total) by mouth daily as needed for fluid. 30 tablet 2  .  gabapentin (NEURONTIN) 300 MG capsule Take 1,500 mg by mouth 2 (two) times daily. 1500 mg AM, 1500 mg PM    . lamoTRIgine (LAMICTAL) 25 MG tablet Take 2 tablets (50 mg total) by mouth daily. 180 tablet 1  . levothyroxine (SYNTHROID, LEVOTHROID) 25 MCG tablet Take 25 mcg by mouth daily.     Marland Kitchen lisinopril (PRINIVIL,ZESTRIL) 5 MG tablet Take 1 tablet (5 mg total) by mouth daily. 30 tablet 3  . meloxicam (MOBIC) 15 MG tablet     . metoprolol tartrate (LOPRESSOR) 25 MG tablet Take 0.5 tablets (12.5 mg total) by mouth 2 (two) times daily. 60 tablet 3  . montelukast (SINGULAIR) 10 MG tablet Take 10 mg by mouth at bedtime.    Marland Kitchen nystatin cream (MYCOSTATIN) Apply topically.    Marland Kitchen oxybutynin (DITROPAN) 5 MG tablet Take 5 mg by mouth 3 (three) times daily as needed for bladder spasms.    . pantoprazole (PROTONIX) 40 MG tablet Take 40 mg by mouth daily. AM    . potassium chloride SA (K-DUR,KLOR-CON) 20 MEQ tablet Take 1 tablet (20 mEq total) by mouth daily. If taking Lasix 30 tablet 3  . pramipexole (MIRAPEX) 0.25 MG tablet Take 0.25-0.5 mg by mouth 3 (three) times daily. Pt takes two tablets in the morning, one tablet at noon, and two tablets at bedtime.     No current facility-administered medications for this visit.     Medical Decision Making:  Established Problem, Stable/Improving (1) and Review of Last Therapy Session (1)  Treatment Plan Summary:Medication management  Depression Continue on Prozac 40 mg in the morning- 90 day supply given  Continue  lamotrigine 50  mg daily for her mood stabilization and discuss about the risk of rash and Katherina Right syndrome.  Anxiety Advised patient to decrease  Klonopin 0.25  mg at bed.  Advised patient to start taking melatonin 5 mg at bedtime to help with the sleep and she agreed with the plan.  Follow-up She will follow-up in 3 months    More than 50% of the time spent in psychoeducation, counseling and coordination of care.  This note was  generated in part or whole with voice recognition software. Voice regonition is usually quite accurate but there are transcription errors that can and very often do occur. I apologize for any typographical errors that were not detected and corrected.    Rainey Pines, MD  06/21/2016, 2:31 PM

## 2016-06-29 DIAGNOSIS — I10 Essential (primary) hypertension: Secondary | ICD-10-CM | POA: Diagnosis not present

## 2016-06-29 DIAGNOSIS — I5033 Acute on chronic diastolic (congestive) heart failure: Secondary | ICD-10-CM | POA: Diagnosis not present

## 2016-07-26 ENCOUNTER — Telehealth: Payer: Self-pay | Admitting: *Deleted

## 2016-07-26 NOTE — Telephone Encounter (Signed)
Patient called is a patient of Dr Grayland Ormond who is now on annual follow up for Stage Ia breast cancer of the right breast. She has found several new lumps in her right breast and would like to be seen for evaluation. Her next appt is not until October. Please advise

## 2016-07-26 NOTE — Telephone Encounter (Signed)
Per Dr Grayland Ormond, have her see Lorretta Harp, NP next week

## 2016-07-26 NOTE — Telephone Encounter (Signed)
Patient agrees to appt Monday at 830

## 2016-07-26 NOTE — Progress Notes (Addendum)
Symptom Management Consult note [facility]  [phone] [phone]  Patient Care Team: Rusty Aus, MD as PCP - General (Internal Medicine)   Name of the patient: Tonya Walton  [id number]  1950-05-21   Date of visit: 07/31/16  Diagnosis- Stage Ia ER/PR positive adenocarcinoma of the right breast, unspecified location.   Chief complaint/ Reason for visit- Breast mass  Heme/Onc history: Stage Ia ER/PR positive adenocarcinoma of the right breast, unspecified location:  Patient completed XRT in January of 2013 and then initiated anastrozole in February 2013. She completed 5 years of treatment in February 2018.  Interval history-  Patient returns to clinic because she has located several new breast mass on the right breast last week. She just recently stopped taking anastrozole in February 2018 after 5 years. She had CABG 1 in March 2017 and is fully recovered from her surgery. She has no neurologic complaints. She denies any recent fevers or illnesses. She has a good appetite and denies weight loss. She has a history of heart failure and recently began taking a diuretic which has resulted in 6-8 lbs of fluid weight loss. She has no chest pain or shortness of breath. She denies any nausea, vomiting, constipation, or diarrhea. She has no urinary complaints. Patient feels at her baseline.   Review of systems- Review of Systems  Constitutional: Negative.   HENT: Negative.   Eyes: Negative.   Respiratory: Negative.   Cardiovascular: Negative.   Gastrointestinal: Negative.   Genitourinary: Negative.   Musculoskeletal: Negative.   Skin: Negative.   Neurological: Negative.   Endo/Heme/Allergies: Negative.   Psychiatric/Behavioral: Negative.      Current treatment- Recently completed five years of Anastrozole completing in February 2018. Her most recent mammogram on December 14, 2015 was reported as BI-RADS 3. She was supposed to follow-up in  6 months (May 2018) with a repeat diagnostic mammogram but has failed to do so at this point. The results from November indicate the possible development of fat necrosis in the right medial breast.   No Known Allergies   Past Medical History:  Diagnosis Date  . Anxiety   . Arthritis   . Atrial fibrillation, controlled ([place])   . Breast cancer ([place]) 2012   RT LUMPECTOMY  . CHF (congestive heart failure) ([place])   . COPD (chronic obstructive pulmonary disease) ([place])   . Coronary artery disease   . Depression   . Dysrhythmia    Afib  . GERD (gastroesophageal reflux disease)   . Headache   . Hypertension   . Hypothyroidism   . Neuromuscular disorder ([place])    nerve damage in legs  . Personal history of radiation therapy 2012   BREAST CA  . Restless leg syndrome   . Shortness of breath dyspnea      Past Surgical History:  Procedure Laterality Date  . APPENDECTOMY    . BREAST BIOPSY Left 07/2013   NEG  . BREAST EXCISIONAL BIOPSY Right 2012   POS  . BREAST LUMPECTOMY     Right breast  . CARDIAC CATHETERIZATION N/A 03/02/2015   Procedure: Left Heart Cath and Coronary Angiography;  Surgeon: Teodoro Spray, MD;  Location: [facility];  Service: Cardiovascular;  Laterality: N/A;  . CATARACT EXTRACTION W/PHACO Right 10/14/2014   Procedure: CATARACT EXTRACTION PHACO AND INTRAOCULAR LENS PLACEMENT (IOC);  Surgeon: Leandrew Koyanagi, MD;  Location: [facility];  Service: Ophthalmology;  Laterality: Right;  . CORONARY ARTERY BYPASS GRAFT N/A 04/01/2015  Procedure: CORONARY ARTERY BYPASS GRAFTING (CABG) x one , using left mammary artery;  Surgeon: Ivin Poot, MD;  Location: [facility];  Service: Open Heart Surgery;  Laterality: N/A;  . JOINT REPLACEMENT Right    Total Knee Replacement  . TEE WITHOUT CARDIOVERSION N/A 04/01/2015   Procedure: TRANSESOPHAGEAL ECHOCARDIOGRAM (TEE);  Surgeon: Ivin Poot, MD;  Location: [facility];  Service: Open Heart Surgery;  Laterality: N/A;    . TONSILLECTOMY    . TOTAL KNEE ARTHROPLASTY Right 06/10/2014   Procedure: TOTAL KNEE ARTHROPLASTY;  Surgeon: Dereck Leep, MD;  Location: [facility];  Service: Orthopedics;  Laterality: Right;  . TOTAL KNEE ARTHROPLASTY Left 09/02/2014   Procedure: TOTAL KNEE ARTHROPLASTY;  Surgeon: Dereck Leep, MD;  Location: [facility];  Service: Orthopedics;  Laterality: Left;    Social History   Social History  . Marital status: Married    Spouse name: N/A  . Number of children: N/A  . Years of education: N/A   Occupational History  . Not on file.   Social History Main Topics  . Smoking status: Former Smoker    Packs/day: 1.00    Types: Cigarettes    Quit date: 04/30/1992  . Smokeless tobacco: Never Used  . Alcohol use No  . Drug use: No  . Sexual activity: No   Other Topics Concern  . Not on file   Social History Narrative  . No narrative on file    Family History  Problem Relation Age of Onset  . Leukemia Mother   . Bone cancer Father   . Heart disease Brother   . Heart disease Brother   . Diabetes Brother   . Hypertension Brother   . Diabetes Brother   . Hypertension Brother   . Heart disease Brother   . Breast cancer Paternal Aunt      Current Outpatient Prescriptions:  .  Artificial Tear Ointment (DRY EYES OP), Place 1 drop into both eyes at bedtime., Disp: , Rfl:  .  aspirin EC 325 MG EC tablet, Take 1 tablet (325 mg total) by mouth daily., Disp: 30 tablet, Rfl: 0 .  atorvastatin (LIPITOR) 40 MG tablet, Take 1 tablet (40 mg total) by mouth daily at 6 PM., Disp: 30 tablet, Rfl: 3 .  clonazePAM (KLONOPIN) 0.5 MG tablet, Take 0.5 tablets (0.25 mg total) by mouth at bedtime., Disp: 15 tablet, Rfl: 3 .  Cyanocobalamin (B-12 COMPLIANCE INJECTION) 1000 MCG/ML KIT, Inject 1,000 mcg as directed every Monday, Wednesday, and Friday. , Disp: , Rfl:  .  Cyanocobalamin (CVS B-12) 1000 MCG/15ML LIQD, Take 1,000 mcg by mouth every Monday, Wednesday, and Friday. Rotates weekly with the  injection, Disp: , Rfl:  .  cyclobenzaprine (FLEXERIL) 10 MG tablet, Take 10 mg by mouth daily. , Disp: , Rfl:  .  esomeprazole (NEXIUM) 40 MG capsule, Take by mouth., Disp: , Rfl:  .  ferrous [id number] C-folic acid (TRINSICON / FOLTRIN) capsule, Take 1 capsule by mouth 3 (three) times daily after meals., Disp: 90 capsule, Rfl: 3 .  FLUoxetine (PROZAC) 40 MG capsule, Take 1 capsule (40 mg total) by mouth daily., Disp: 90 capsule, Rfl: 1 .  furosemide (LASIX) 40 MG tablet, Take 1 tablet (40 mg total) by mouth daily as needed for fluid., Disp: 30 tablet, Rfl: 2 .  gabapentin (NEURONTIN) 300 MG capsule, Take 1,500 mg by mouth 2 (two) times daily. 1500 mg AM, 1500 mg PM, Disp: , Rfl:  .  lamoTRIgine (LAMICTAL) 25 MG  tablet, Take 2 tablets (50 mg total) by mouth daily., Disp: 180 tablet, Rfl: 1 .  levothyroxine (SYNTHROID, LEVOTHROID) 25 MCG tablet, Take 25 mcg by mouth daily. , Disp: , Rfl:  .  lisinopril (PRINIVIL,ZESTRIL) 5 MG tablet, Take 1 tablet (5 mg total) by mouth daily., Disp: 30 tablet, Rfl: 3 .  meloxicam (MOBIC) 15 MG tablet, , Disp: , Rfl:  .  metoprolol tartrate (LOPRESSOR) 25 MG tablet, Take 0.5 tablets (12.5 mg total) by mouth 2 (two) times daily., Disp: 60 tablet, Rfl: 3 .  montelukast (SINGULAIR) 10 MG tablet, Take 10 mg by mouth at bedtime., Disp: , Rfl:  .  oxybutynin (DITROPAN) 5 MG tablet, Take 5 mg by mouth 3 (three) times daily as needed for bladder spasms., Disp: , Rfl:  .  pantoprazole (PROTONIX) 40 MG tablet, Take 40 mg by mouth daily. AM, Disp: , Rfl:  .  potassium chloride SA (K-DUR,KLOR-CON) 20 MEQ tablet, Take 1 tablet (20 mEq total) by mouth daily. If taking Lasix, Disp: 30 tablet, Rfl: 3 .  pramipexole (MIRAPEX) 0.25 MG tablet, Take 0.25-0.5 mg by mouth 3 (three) times daily. Pt takes two tablets in the morning, one tablet at noon, and two tablets at bedtime., Disp: , Rfl:  .  anastrozole (ARIMIDEX) 1 MG tablet, Take 1 mg by mouth daily. , Disp: , Rfl:    Physical exam:  Vitals:   07/31/16 0904  BP: (!) 148/94  Pulse: 71  Resp: 20  Temp: (!) 96.8 F (36 C)  TempSrc: Tympanic  Weight: 220 lb 11.2 oz (100.1 kg)    ECOG FS:0 - Asymptomatic  Physical Exam  Constitutional: She is oriented to person, place, and time and well-developed, well-nourished, and in no distress.  HENT:  Head: Normocephalic and atraumatic.  Eyes: Pupils are equal, round, and reactive to light.  Neck: Normal range of motion.  Cardiovascular: Normal rate, regular rhythm, normal heart sounds and intact distal pulses.   Pulmonary/Chest: Effort normal and breath sounds normal. Right breast exhibits mass and tenderness.    Abdominal: Soft. Bowel sounds are normal.  Musculoskeletal: Normal range of motion.  Neurological: She is alert and oriented to person, place, and time. She has normal reflexes. Gait normal.  Skin: Skin is warm and dry.  Psychiatric: Affect normal.  Nursing note and vitals reviewed.    CMP Latest Ref Rng & Units 11/08/2015  Glucose 65 - 99 mg/dL 97  BUN 6 - 20 mg/dL 15  Creatinine 0.44 - 1.00 mg/dL 0.66  Sodium 135 - 145 mmol/L 139  Potassium 3.5 - 5.1 mmol/L 3.9  Chloride 101 - 111 mmol/L 106  CO2 22 - 32 mmol/L 25  Calcium 8.9 - 10.3 mg/dL 9.3  Total Protein 6.5 - 8.1 g/dL 7.9  Total Bilirubin 0.3 - 1.2 mg/dL 0.5  Alkaline Phos 38 - 126 U/L 79  AST 15 - 41 U/L 22  ALT 14 - 54 U/L 14   CBC Latest Ref Rng & Units 11/08/2015  WBC 3.6 - 11.0 K/uL 8.1  Hemoglobin 12.0 - 16.0 g/dL 12.1  Hematocrit 35.0 - 47.0 % 36.0  Platelets 150 - 440 K/uL 268    No images are attached to the encounter.  No results found.   Assessment and plan- Patient is a 66 y.o. female presenting for two right breast mass that the patient palpated on her self breast exams last week. On examination, two breast mass palpated, one at 3 o'clock and one at 8 o'clock. Left breast is  without mass.  1. Repeat mammogram and ultrasound of right breast ASAP. 2. Lab  encounter for a Ca 27.29. Called patient about normal Ca 27.29.   Will call patient with result and determine when follow-up is needed. May need a biopsy.    Visit Diagnosis 1. Primary cancer of right female breast ([place])     Marisue Humble [facility] at [facility] [phone] 07/31/2016 10:39 AM

## 2016-07-31 ENCOUNTER — Inpatient Hospital Stay: Payer: PPO | Attending: Oncology | Admitting: Oncology

## 2016-07-31 ENCOUNTER — Other Ambulatory Visit: Payer: Self-pay | Admitting: Oncology

## 2016-07-31 ENCOUNTER — Inpatient Hospital Stay: Payer: PPO

## 2016-07-31 VITALS — BP 148/94 | HR 71 | Temp 96.8°F | Resp 20 | Wt 220.7 lb

## 2016-07-31 DIAGNOSIS — I10 Essential (primary) hypertension: Secondary | ICD-10-CM

## 2016-07-31 DIAGNOSIS — F329 Major depressive disorder, single episode, unspecified: Secondary | ICD-10-CM

## 2016-07-31 DIAGNOSIS — I2510 Atherosclerotic heart disease of native coronary artery without angina pectoris: Secondary | ICD-10-CM

## 2016-07-31 DIAGNOSIS — N6312 Unspecified lump in the right breast, upper inner quadrant: Secondary | ICD-10-CM | POA: Diagnosis not present

## 2016-07-31 DIAGNOSIS — Z79899 Other long term (current) drug therapy: Secondary | ICD-10-CM

## 2016-07-31 DIAGNOSIS — F419 Anxiety disorder, unspecified: Secondary | ICD-10-CM | POA: Diagnosis not present

## 2016-07-31 DIAGNOSIS — M129 Arthropathy, unspecified: Secondary | ICD-10-CM | POA: Diagnosis not present

## 2016-07-31 DIAGNOSIS — Z923 Personal history of irradiation: Secondary | ICD-10-CM

## 2016-07-31 DIAGNOSIS — Z87891 Personal history of nicotine dependence: Secondary | ICD-10-CM | POA: Diagnosis not present

## 2016-07-31 DIAGNOSIS — Z853 Personal history of malignant neoplasm of breast: Secondary | ICD-10-CM

## 2016-07-31 DIAGNOSIS — K219 Gastro-esophageal reflux disease without esophagitis: Secondary | ICD-10-CM | POA: Diagnosis not present

## 2016-07-31 DIAGNOSIS — C50911 Malignant neoplasm of unspecified site of right female breast: Secondary | ICD-10-CM

## 2016-07-31 DIAGNOSIS — E039 Hypothyroidism, unspecified: Secondary | ICD-10-CM

## 2016-07-31 DIAGNOSIS — R634 Abnormal weight loss: Secondary | ICD-10-CM

## 2016-07-31 DIAGNOSIS — J449 Chronic obstructive pulmonary disease, unspecified: Secondary | ICD-10-CM | POA: Diagnosis not present

## 2016-07-31 DIAGNOSIS — Z803 Family history of malignant neoplasm of breast: Secondary | ICD-10-CM

## 2016-07-31 DIAGNOSIS — Z808 Family history of malignant neoplasm of other organs or systems: Secondary | ICD-10-CM | POA: Diagnosis not present

## 2016-07-31 DIAGNOSIS — N6313 Unspecified lump in the right breast, lower outer quadrant: Secondary | ICD-10-CM

## 2016-07-31 DIAGNOSIS — Z9221 Personal history of antineoplastic chemotherapy: Secondary | ICD-10-CM | POA: Diagnosis not present

## 2016-07-31 DIAGNOSIS — Z7982 Long term (current) use of aspirin: Secondary | ICD-10-CM | POA: Diagnosis not present

## 2016-07-31 DIAGNOSIS — I509 Heart failure, unspecified: Secondary | ICD-10-CM | POA: Diagnosis not present

## 2016-07-31 DIAGNOSIS — I4891 Unspecified atrial fibrillation: Secondary | ICD-10-CM

## 2016-07-31 NOTE — Progress Notes (Signed)
Patient here today for follow up regarding new breast lumps. Patient reports that has felt 2 new lumps in right breast, first felt lumps last week.

## 2016-08-01 LAB — CANCER ANTIGEN 27.29: CA 27.29: 14.8 U/mL (ref 0.0–38.6)

## 2016-08-04 ENCOUNTER — Other Ambulatory Visit: Payer: Self-pay | Admitting: Oncology

## 2016-08-04 ENCOUNTER — Ambulatory Visit
Admission: RE | Admit: 2016-08-04 | Discharge: 2016-08-04 | Disposition: A | Discharge status: Home / Self Care | Payer: PPO | Source: Ambulatory Visit | Attending: Oncology | Admitting: Oncology

## 2016-08-04 DIAGNOSIS — C50911 Malignant neoplasm of unspecified site of right female breast: Secondary | ICD-10-CM

## 2016-08-04 DIAGNOSIS — N6310 Unspecified lump in the right breast, unspecified quadrant: Secondary | ICD-10-CM

## 2016-08-04 DIAGNOSIS — R921 Mammographic calcification found on diagnostic imaging of breast: Secondary | ICD-10-CM | POA: Diagnosis not present

## 2016-08-04 DIAGNOSIS — R928 Other abnormal and inconclusive findings on diagnostic imaging of breast: Secondary | ICD-10-CM

## 2016-08-04 DIAGNOSIS — N6489 Other specified disorders of breast: Secondary | ICD-10-CM | POA: Diagnosis not present

## 2016-08-07 ENCOUNTER — Other Ambulatory Visit: Payer: Self-pay | Admitting: Oncology

## 2016-08-07 DIAGNOSIS — C50911 Malignant neoplasm of unspecified site of right female breast: Secondary | ICD-10-CM

## 2016-08-08 ENCOUNTER — Other Ambulatory Visit: Payer: Self-pay | Admitting: Oncology

## 2016-08-17 DIAGNOSIS — E782 Mixed hyperlipidemia: Secondary | ICD-10-CM | POA: Diagnosis not present

## 2016-08-17 DIAGNOSIS — Z0000 Encounter for general adult medical examination without abnormal findings: Secondary | ICD-10-CM | POA: Diagnosis not present

## 2016-08-22 ENCOUNTER — Ambulatory Visit
Admission: RE | Admit: 2016-08-22 | Discharge: 2016-08-22 | Disposition: A | Discharge status: Home / Self Care | Payer: PPO | Source: Ambulatory Visit | Attending: Oncology | Admitting: Oncology

## 2016-08-22 DIAGNOSIS — N6310 Unspecified lump in the right breast, unspecified quadrant: Secondary | ICD-10-CM

## 2016-08-22 DIAGNOSIS — N641 Fat necrosis of breast: Secondary | ICD-10-CM | POA: Insufficient documentation

## 2016-08-22 DIAGNOSIS — N6031 Fibrosclerosis of right breast: Secondary | ICD-10-CM | POA: Diagnosis not present

## 2016-08-22 DIAGNOSIS — N6311 Unspecified lump in the right breast, upper outer quadrant: Secondary | ICD-10-CM | POA: Diagnosis not present

## 2016-08-22 DIAGNOSIS — R921 Mammographic calcification found on diagnostic imaging of breast: Secondary | ICD-10-CM | POA: Insufficient documentation

## 2016-08-22 DIAGNOSIS — R928 Other abnormal and inconclusive findings on diagnostic imaging of breast: Secondary | ICD-10-CM

## 2016-08-22 DIAGNOSIS — N6312 Unspecified lump in the right breast, upper inner quadrant: Secondary | ICD-10-CM | POA: Diagnosis not present

## 2016-08-22 DIAGNOSIS — Z853 Personal history of malignant neoplasm of breast: Secondary | ICD-10-CM | POA: Diagnosis not present

## 2016-08-22 HISTORY — PX: BREAST BIOPSY: SHX20

## 2016-08-23 DIAGNOSIS — M5010 Cervical disc disorder with radiculopathy, unspecified cervical region: Secondary | ICD-10-CM | POA: Diagnosis not present

## 2016-08-23 DIAGNOSIS — E782 Mixed hyperlipidemia: Secondary | ICD-10-CM | POA: Diagnosis not present

## 2016-08-23 DIAGNOSIS — I480 Paroxysmal atrial fibrillation: Secondary | ICD-10-CM | POA: Diagnosis not present

## 2016-08-23 DIAGNOSIS — D510 Vitamin B12 deficiency anemia due to intrinsic factor deficiency: Secondary | ICD-10-CM | POA: Diagnosis not present

## 2016-08-23 DIAGNOSIS — D500 Iron deficiency anemia secondary to blood loss (chronic): Secondary | ICD-10-CM | POA: Diagnosis not present

## 2016-08-23 DIAGNOSIS — R739 Hyperglycemia, unspecified: Secondary | ICD-10-CM | POA: Diagnosis not present

## 2016-08-23 DIAGNOSIS — Z79899 Other long term (current) drug therapy: Secondary | ICD-10-CM | POA: Diagnosis not present

## 2016-08-23 LAB — SURGICAL PATHOLOGY

## 2016-09-18 ENCOUNTER — Encounter: Payer: Self-pay | Admitting: Psychiatry

## 2016-09-18 ENCOUNTER — Ambulatory Visit (INDEPENDENT_AMBULATORY_CARE_PROVIDER_SITE_OTHER): Payer: PPO | Admitting: Psychiatry

## 2016-09-18 ENCOUNTER — Other Ambulatory Visit: Payer: Self-pay | Admitting: Psychiatry

## 2016-09-18 VITALS — BP 118/60 | HR 64 | Ht 65.75 in | Wt 217.0 lb

## 2016-09-18 DIAGNOSIS — F39 Unspecified mood [affective] disorder: Secondary | ICD-10-CM | POA: Diagnosis not present

## 2016-09-18 MED ORDER — FLUOXETINE HCL 40 MG PO CAPS: 40.0000 mg | ORAL_CAPSULE | Freq: Every day | ORAL | 1 refills | Status: DC

## 2016-09-18 MED ORDER — LAMOTRIGINE 25 MG PO TABS: 50.0000 mg | ORAL_TABLET | Freq: Every day | ORAL | 1 refills | Status: DC

## 2016-09-18 MED ORDER — CLONAZEPAM 0.5 MG PO TABS: 0.2500 mg | ORAL_TABLET | Freq: Every day | ORAL | 3 refills | Status: DC

## 2016-09-18 NOTE — Progress Notes (Signed)
BH MD/PA/NP OP Progress Note  09/18/2016 12:45 PM Tonya Walton  MRN:  [id number]  Subjective:  Pt is a 66 year old married female who presented for the follow-up. She appeared calm during the interview. She reported that she has been compliant with her medications. She reported that she takes the medications as prescribed. She reported that they're helping her now and she is not having any side effects. She was discussing about her hairstyle  as she has her hair cut today. She reported that she has been taking her husband to the psychiatrist and was happy about the same. She stated that she sleeps well at night. She takes her medications regularly and does not have any side effects.    She remains pleasant and cooperative during the interview. Patient reported that she has good relationship with her husband. She denied having any perceptual disturbances. She denied having any suicidal homicidal ideations or plans.   Her mood is calm and she does not have any acute mood symptoms anxiety or paranoia.       Chief Complaint:  Chief Complaint    Follow-up     Visit Diagnosis:     ICD-10-CM   1. Episodic mood disorder ([place]) F39     Past Medical History:  Past Medical History:  Diagnosis Date  . Anxiety   . Arthritis   . Atrial fibrillation, controlled ([place])   . Breast cancer ([place]) 2012   RT LUMPECTOMY  . CHF (congestive heart failure) ([place])   . COPD (chronic obstructive pulmonary disease) ([place])   . Coronary artery disease   . Depression   . Dysrhythmia    Afib  . GERD (gastroesophageal reflux disease)   . Headache   . Hypertension   . Hypothyroidism   . Neuromuscular disorder ([place])    nerve damage in legs  . Personal history of radiation therapy 2012   BREAST CA  . Restless leg syndrome   . Shortness of breath dyspnea     Past Surgical History:  Procedure Laterality Date  . APPENDECTOMY    . BREAST BIOPSY Left 07/2013   NEG  . BREAST BIOPSY Right 08/22/2016   US  guided breast biopsy  . BREAST EXCISIONAL BIOPSY Right 2012   POS  . BREAST LUMPECTOMY     Right breast  . CARDIAC CATHETERIZATION N/A 03/02/2015   Procedure: Left Heart Cath and Coronary Angiography;  Surgeon: Teodoro Spray, MD;  Location: [facility];  Service: Cardiovascular;  Laterality: N/A;  . CATARACT EXTRACTION W/PHACO Right 10/14/2014   Procedure: CATARACT EXTRACTION PHACO AND INTRAOCULAR LENS PLACEMENT (IOC);  Surgeon: Leandrew Koyanagi, MD;  Location: [facility];  Service: Ophthalmology;  Laterality: Right;  . CORONARY ARTERY BYPASS GRAFT N/A 04/01/2015   Procedure: CORONARY ARTERY BYPASS GRAFTING (CABG) x one , using left mammary artery;  Surgeon: Ivin Poot, MD;  Location: [facility];  Service: Open Heart Surgery;  Laterality: N/A;  . JOINT REPLACEMENT Right    Total Knee Replacement  . TEE WITHOUT CARDIOVERSION N/A 04/01/2015   Procedure: TRANSESOPHAGEAL ECHOCARDIOGRAM (TEE);  Surgeon: Ivin Poot, MD;  Location: [facility];  Service: Open Heart Surgery;  Laterality: N/A;  . TONSILLECTOMY    . TOTAL KNEE ARTHROPLASTY Right 06/10/2014   Procedure: TOTAL KNEE ARTHROPLASTY;  Surgeon: Dereck Leep, MD;  Location: [facility];  Service: Orthopedics;  Laterality: Right;  . TOTAL KNEE ARTHROPLASTY Left 09/02/2014   Procedure: TOTAL KNEE ARTHROPLASTY;  Surgeon: Dereck Leep, MD;  Location: [facility];  Service: Orthopedics;  Laterality: Left;   Family History:  Family History  Problem Relation Age of Onset  . Leukemia Mother   . Bone cancer Father   . Heart disease Brother   . Heart disease Brother   . Diabetes Brother   . Hypertension Brother   . Diabetes Brother   . Hypertension Brother   . Heart disease Brother   . Breast cancer Paternal Aunt    Social History:  Social History   Social History  . Marital status: Married    Spouse name: N/A  . Number of children: N/A  . Years of education: N/A   Social History Main Topics  . Smoking status: Former Smoker     Packs/day: 1.00    Types: Cigarettes    Quit date: 04/30/1992  . Smokeless tobacco: Never Used  . Alcohol use No  . Drug use: No  . Sexual activity: Yes    Partners: Male    Birth control/ protection: None   Other Topics Concern  . None   Social History Narrative  . None   Additional History:  She currently lives with her husband who is very helpful and has been taking care of her during her surgery. She reported that he is a good  nurse  Assessment:   Musculoskeletal: Strength & Muscle Tone: within normal limits Gait & Station: normal Patient leans: N/A  Psychiatric Specialty Exam: Medication Refill  Associated symptoms include chest pain. Pertinent negatives include no chills, congestion, nausea or rash.  Depression         Associated symptoms include does not have insomnia and no suicidal ideas.   Review of Systems  Constitutional: Negative for chills.  HENT: Negative for congestion and tinnitus.   Eyes: Negative for double vision.  Respiratory: Negative for hemoptysis.   Cardiovascular: Positive for chest pain. Negative for palpitations.  Gastrointestinal: Positive for diarrhea. Negative for nausea.  Genitourinary: Negative for urgency.  Musculoskeletal: Positive for joint pain. Negative for back pain.  Skin: Negative for rash.  Neurological: Negative for sensory change.  Endo/Heme/Allergies: Negative for environmental allergies.  Psychiatric/Behavioral: Positive for depression. Negative for substance abuse and suicidal ideas. The patient does not have insomnia.     Blood pressure 118/60, pulse 64, height 5' 5.75" (1.67 m), weight 217 lb (98.4 kg).Body mass index is 35.29 kg/m.  General Appearance: Casual  Eye Contact:  Fair  Speech:  Normal Rate  Volume:  Normal  Mood:  Euthymic  Affect:  Appropriate  Thought Process:  Coherent  Orientation:  Full (Time, Place, and Person)  Thought Content:  WDL  Suicidal Thoughts:  No  Homicidal Thoughts:  No   Memory:  Immediate;   Fair  Judgement:  Fair  Insight:  Fair  Psychomotor Activity:  Normal  Concentration:  Fair  Recall:  [employer] of Knowledge: Fair  Language: Fair  Akathisia:  No  Handed:  Right  AIMS (if indicated):  none  Assets:  Communication Skills Desire for Improvement Social Support  ADL's:  Intact  Cognition: WNL  Sleep:  8-10   Is the patient at risk to self?  No. Has the patient been a risk to self in the past 6 months?  No. Has the patient been a risk to self within the distant past?  No. Is the patient a risk to others?  No. Has the patient been a risk to others in the past 6 months?  No. Has the patient been  a risk to others within the distant past?  No.  Current Medications: Current Outpatient Prescriptions  Medication Sig Dispense Refill  . anastrozole (ARIMIDEX) 1 MG tablet Take 1 mg by mouth daily.     . Artificial Tear Ointment (DRY EYES OP) Place 1 drop into both eyes at bedtime.    Marland Kitchen aspirin EC 325 MG EC tablet Take 1 tablet (325 mg total) by mouth daily. 30 tablet 0  . atorvastatin (LIPITOR) 40 MG tablet Take 1 tablet (40 mg total) by mouth daily at 6 PM. 30 tablet 3  . clonazePAM (KLONOPIN) 0.5 MG tablet Take 0.5 tablets (0.25 mg total) by mouth at bedtime. 15 tablet 3  . Cyanocobalamin (B-12 COMPLIANCE INJECTION) 1000 MCG/ML KIT Inject 1,000 mcg as directed every Monday, Wednesday, and Friday.     . Cyanocobalamin ([employer] B-12) 1000 MCG/15ML LIQD Take 1,000 mcg by mouth every Monday, Wednesday, and Friday. Rotates weekly with the injection    . cyclobenzaprine (FLEXERIL) 10 MG tablet Take 10 mg by mouth daily.     Marland Kitchen esomeprazole (NEXIUM) 40 MG capsule Take by mouth.    . ferrous [id number] C-folic acid (TRINSICON / FOLTRIN) capsule Take 1 capsule by mouth 3 (three) times daily after meals. 90 capsule 3  . FLUoxetine (PROZAC) 40 MG capsule Take 1 capsule (40 mg total) by mouth daily. 90 capsule 1  . furosemide (LASIX) 40 MG tablet Take 1  tablet (40 mg total) by mouth daily as needed for fluid. 30 tablet 2  . gabapentin (NEURONTIN) 300 MG capsule Take 1,500 mg by mouth 2 (two) times daily. 1500 mg AM, 1500 mg PM    . lamoTRIgine (LAMICTAL) 25 MG tablet Take 2 tablets (50 mg total) by mouth daily. 180 tablet 1  . levothyroxine (SYNTHROID, LEVOTHROID) 25 MCG tablet Take 25 mcg by mouth daily.     Marland Kitchen lisinopril (PRINIVIL,ZESTRIL) 5 MG tablet Take 1 tablet (5 mg total) by mouth daily. 30 tablet 3  . meloxicam (MOBIC) 15 MG tablet     . metoprolol tartrate (LOPRESSOR) 25 MG tablet Take 0.5 tablets (12.5 mg total) by mouth 2 (two) times daily. 60 tablet 3  . montelukast (SINGULAIR) 10 MG tablet Take 10 mg by mouth at bedtime.    Marland Kitchen oxybutynin (DITROPAN) 5 MG tablet Take 5 mg by mouth 3 (three) times daily as needed for bladder spasms.    . pantoprazole (PROTONIX) 40 MG tablet Take 40 mg by mouth daily. AM    . potassium chloride SA (K-DUR,KLOR-CON) 20 MEQ tablet Take 1 tablet (20 mEq total) by mouth daily. If taking Lasix 30 tablet 3  . pramipexole (MIRAPEX) 0.25 MG tablet Take 0.25-0.5 mg by mouth 3 (three) times daily. Pt takes two tablets in the morning, one tablet at noon, and two tablets at bedtime.     No current facility-administered medications for this visit.     Medical Decision Making:  Established Problem, Stable/Improving (1) and Review of Last Therapy Session (1)  Treatment Plan Summary:Medication management  Depression Continue on Prozac 40 mg in the morning- 90 day supply given  Continue  lamotrigine 50  mg daily for her mood stabilization and discuss about the risk of rash and Katherina Right syndrome.  Anxiety Advised patient to decrease  Klonopin 0.25  mg at bed.   Follow-up She will follow-up in 3 months    More than 50% of the time spent in psychoeducation, counseling and coordination of care.     This note was  generated in part or whole with voice recognition software. Voice regonition is usually quite  accurate but there are transcription errors that can and very often do occur. I apologize for any typographical errors that were not detected and corrected.    Rainey Pines, MD  09/18/2016, 12:45 PM

## 2016-09-19 ENCOUNTER — Other Ambulatory Visit: Payer: Self-pay | Admitting: Psychiatry

## 2016-09-20 DIAGNOSIS — D500 Iron deficiency anemia secondary to blood loss (chronic): Secondary | ICD-10-CM | POA: Diagnosis not present

## 2016-10-05 DIAGNOSIS — Z96652 Presence of left artificial knee joint: Secondary | ICD-10-CM | POA: Diagnosis not present

## 2016-10-05 DIAGNOSIS — Z96651 Presence of right artificial knee joint: Secondary | ICD-10-CM | POA: Diagnosis not present

## 2016-10-19 DIAGNOSIS — M5116 Intervertebral disc disorders with radiculopathy, lumbar region: Secondary | ICD-10-CM | POA: Diagnosis not present

## 2016-10-20 DIAGNOSIS — Z961 Presence of intraocular lens: Secondary | ICD-10-CM | POA: Diagnosis not present

## 2016-11-03 ENCOUNTER — Other Ambulatory Visit: Payer: Self-pay

## 2016-11-03 DIAGNOSIS — C50911 Malignant neoplasm of unspecified site of right female breast: Secondary | ICD-10-CM

## 2016-11-05 NOTE — Progress Notes (Signed)
[facility]  [phone] [phone]) [phone]  ID: Tonya Walton OB: [birth year]  MR#: [id number]  KJZ#:[id number]  Patient Care Team: Rusty Aus, MD as PCP - General (Internal Medicine)  CHIEF COMPLAINT: Stage Ia ER/PR positive adenocarcinoma of the right breast, unspecified location.  INTERVAL HISTORY: Patient returns to clinic today for routine yearly follow-up. She had a breast biopsy in July 2018 for suspicious calcifications that was negative for malignancy. She completed 5 years of anastrozole in February 2018. She currently feels well and is asymptomatic.  She has no neurologic complaints. She denies any recent fevers or illnesses. She has a good appetite and denies weight loss. She has no chest pain or shortness of breath. She denies any nausea, vomiting, constipation, or diarrhea. She has no urinary complaints. Patient feels at her baseline and offers no specific complaints today.  REVIEW OF SYSTEMS:   Review of Systems  Constitutional: Negative.  Negative for fever, malaise/fatigue and weight loss.  Respiratory: Negative.  Negative for cough and shortness of breath.   Cardiovascular: Negative.  Negative for chest pain and leg swelling.  Gastrointestinal: Negative.  Negative for abdominal pain.  Genitourinary: Negative.   Musculoskeletal: Negative.   Skin: Negative.  Negative for rash.  Neurological: Negative.  Negative for weakness.  Psychiatric/Behavioral: Negative.  The patient is not nervous/anxious.     As per HPI. Otherwise, a complete review of systems is negative.  PAST MEDICAL HISTORY: Past Medical History:  Diagnosis Date  . Anxiety   . Arthritis   . Atrial fibrillation, controlled ([place])   . Breast cancer ([place]) 2012   RT LUMPECTOMY  . CHF (congestive heart failure) ([place])   . COPD (chronic obstructive pulmonary disease) ([place])   . Coronary artery disease   . Depression   . Dysrhythmia    Afib  . GERD (gastroesophageal  reflux disease)   . Headache   . Hypertension   . Hypothyroidism   . Neuromuscular disorder ([place])    nerve damage in legs  . Personal history of radiation therapy 2012   BREAST CA  . Restless leg syndrome   . Shortness of breath dyspnea     PAST SURGICAL HISTORY: Past Surgical History:  Procedure Laterality Date  . APPENDECTOMY    . BREAST BIOPSY Left 07/2013   NEG  . BREAST BIOPSY Right 08/22/2016   US guided breast biopsy  . BREAST EXCISIONAL BIOPSY Right 2012   POS  . BREAST LUMPECTOMY     Right breast  . CARDIAC CATHETERIZATION N/A 03/02/2015   Procedure: Left Heart Cath and Coronary Angiography;  Surgeon: Teodoro Spray, MD;  Location: [facility];  Service: Cardiovascular;  Laterality: N/A;  . CATARACT EXTRACTION W/PHACO Right 10/14/2014   Procedure: CATARACT EXTRACTION PHACO AND INTRAOCULAR LENS PLACEMENT (IOC);  Surgeon: Leandrew Koyanagi, MD;  Location: [facility];  Service: Ophthalmology;  Laterality: Right;  . CORONARY ARTERY BYPASS GRAFT N/A 04/01/2015   Procedure: CORONARY ARTERY BYPASS GRAFTING (CABG) x one , using left mammary artery;  Surgeon: Ivin Poot, MD;  Location: [facility];  Service: Open Heart Surgery;  Laterality: N/A;  . JOINT REPLACEMENT Right    Total Knee Replacement  . TEE WITHOUT CARDIOVERSION N/A 04/01/2015   Procedure: TRANSESOPHAGEAL ECHOCARDIOGRAM (TEE);  Surgeon: Ivin Poot, MD;  Location: [facility];  Service: Open Heart Surgery;  Laterality: N/A;  . TONSILLECTOMY    . TOTAL KNEE ARTHROPLASTY Right 06/10/2014   Procedure: TOTAL KNEE ARTHROPLASTY;  Surgeon:  Dereck Leep, MD;  Location: [facility];  Service: Orthopedics;  Laterality: Right;  . TOTAL KNEE ARTHROPLASTY Left 09/02/2014   Procedure: TOTAL KNEE ARTHROPLASTY;  Surgeon: Dereck Leep, MD;  Location: [facility];  Service: Orthopedics;  Laterality: Left;    FAMILY HISTORY: Family History  Problem Relation Age of Onset  . Leukemia Mother   . Bone cancer Father   . Heart  disease Brother   . Heart disease Brother   . Diabetes Brother   . Hypertension Brother   . Diabetes Brother   . Hypertension Brother   . Heart disease Brother   . Breast cancer Paternal Aunt     ADVANCED DIRECTIVES (Y/N):  N  HEALTH MAINTENANCE: Social History  Substance Use Topics  . Smoking status: Former Smoker    Packs/day: 1.00    Types: Cigarettes    Quit date: 04/30/1992  . Smokeless tobacco: Never Used  . Alcohol use No     Colonoscopy:  PAP:  Bone density:  Lipid panel:  No Known Allergies  Current Outpatient Prescriptions  Medication Sig Dispense Refill  . Artificial Tear Ointment (DRY EYES OP) Place 1 drop into both eyes at bedtime.    Marland Kitchen aspirin EC 325 MG EC tablet Take 1 tablet (325 mg total) by mouth daily. 30 tablet 0  . atorvastatin (LIPITOR) 40 MG tablet Take 1 tablet (40 mg total) by mouth daily at 6 PM. 30 tablet 3  . clonazePAM (KLONOPIN) 0.5 MG tablet Take 0.5 tablets (0.25 mg total) by mouth at bedtime. 15 tablet 3  . Cyanocobalamin (B-12 COMPLIANCE INJECTION) 1000 MCG/ML KIT Inject 1,000 mcg as directed every Monday, Wednesday, and Friday.     . Cyanocobalamin ([employer] B-12) 1000 MCG/15ML LIQD Take 1,000 mcg by mouth every Monday, Wednesday, and Friday. Rotates weekly with the injection    . cyclobenzaprine (FLEXERIL) 10 MG tablet Take 10 mg by mouth daily.     Marland Kitchen esomeprazole (NEXIUM) 40 MG capsule Take by mouth.    . ferrous [id number] C-folic acid (TRINSICON / FOLTRIN) capsule Take 1 capsule by mouth 3 (three) times daily after meals. 90 capsule 3  . FLUoxetine (PROZAC) 40 MG capsule Take 1 capsule (40 mg total) by mouth daily. 90 capsule 1  . furosemide (LASIX) 40 MG tablet Take 1 tablet (40 mg total) by mouth daily as needed for fluid. 30 tablet 2  . gabapentin (NEURONTIN) 300 MG capsule Take 1,500 mg by mouth 2 (two) times daily. 1500 mg AM, 1500 mg PM    . lamoTRIgine (LAMICTAL) 25 MG tablet Take 2 tablets (50 mg total) by mouth daily. 180  tablet 1  . levothyroxine (SYNTHROID, LEVOTHROID) 25 MCG tablet Take 25 mcg by mouth daily.     Marland Kitchen lisinopril (PRINIVIL,ZESTRIL) 5 MG tablet Take 1 tablet (5 mg total) by mouth daily. 30 tablet 3  . meloxicam (MOBIC) 15 MG tablet     . metoprolol tartrate (LOPRESSOR) 25 MG tablet Take 0.5 tablets (12.5 mg total) by mouth 2 (two) times daily. 60 tablet 3  . montelukast (SINGULAIR) 10 MG tablet Take 10 mg by mouth at bedtime.    Marland Kitchen oxybutynin (DITROPAN) 5 MG tablet Take 5 mg by mouth 3 (three) times daily as needed for bladder spasms.    . pantoprazole (PROTONIX) 40 MG tablet Take 40 mg by mouth daily. AM    . potassium chloride SA (K-DUR,KLOR-CON) 20 MEQ tablet Take 1 tablet (20 mEq total) by mouth daily. If taking Lasix 30  tablet 3  . pramipexole (MIRAPEX) 0.25 MG tablet Take 0.25-0.5 mg by mouth 3 (three) times daily. Pt takes two tablets in the morning, one tablet at noon, and two tablets at bedtime.     No current facility-administered medications for this visit.     OBJECTIVE: Vitals:   11/07/16 1058  BP: 124/74  Pulse: 65  Resp: 18  Temp: (!) 97 F (36.1 C)     Body mass index is 36.61 kg/m.    ECOG FS:0 - Asymptomatic  General: Well-developed, well-nourished, no acute distress. Eyes: Pink conjunctiva, anicteric sclera. Breasts: Bilateral breasts and axilla without lumps or masses. Lungs: Clear to auscultation bilaterally. Heart: Regular rate and rhythm. No rubs, murmurs, or gallops. Abdomen: Soft, nontender, nondistended. No organomegaly noted, normoactive bowel sounds. Musculoskeletal: No edema, cyanosis, or clubbing. Neuro: Alert, answering all questions appropriately. Cranial nerves grossly intact. Skin: No rashes or petechiae noted. Psych: Normal affect.   LAB RESULTS:  Lab Results  Component Value Date   NA 139 11/08/2015   K 3.9 11/08/2015   CL 106 11/08/2015   CO2 25 11/08/2015   GLUCOSE 97 11/08/2015   BUN 15 11/08/2015   CREATININE 0.66 11/08/2015    CALCIUM 9.3 11/08/2015   PROT 7.9 11/08/2015   ALBUMIN 4.1 11/08/2015   AST 22 11/08/2015   ALT 14 11/08/2015   ALKPHOS 79 11/08/2015   BILITOT 0.5 11/08/2015   GFRNONAA >60 11/08/2015   GFRAA >60 11/08/2015    Lab Results  Component Value Date   WBC 8.1 11/08/2015   NEUTROABS 5.1 11/08/2015   HGB 12.1 11/08/2015   HCT 36.0 11/08/2015   MCV 86.7 11/08/2015   PLT 268 11/08/2015     STUDIES: No results found.  ASSESSMENT: Stage Ia ER/PR positive adenocarcinoma of the right breast, unspecified location.  PLAN:    1. Stage Ia ER/PR positive adenocarcinoma of the right breast, unspecified location:  Patient completed XRT in January of 2013. She completed 5 years of anastrozole in February 2018. Breast biopsy on August 26, 2016 for suspicious calcifications was negative for malignancy. Patient will require repeat mammogram in July 2019. Return to clinic in 1 year for further evaluation. 2. Osteopenia: Patient's most recent bone mineral density on December 14, 2015 revealed a T score of -1.4 which is unchanged from one year prior. Continue monitoring per primary care.   Approximately 20 minutes was spent in discussion of which greater than 50% was consultation.   Patient expressed understanding and was in agreement with this plan. She also understands that She can call clinic at any time with any questions, concerns, or complaints.   Cancer Staging Primary cancer of right female breast [facility]) Staging form: Breast, AJCC 7th Edition - Clinical stage from 10/17/2015: Stage IA (T1c, N0, M0) - Signed by Lloyd Huger, MD on 10/17/2015   Lloyd Huger, MD   11/07/2016 11:32 AM

## 2016-11-07 ENCOUNTER — Inpatient Hospital Stay: Payer: PPO | Attending: Oncology | Admitting: Oncology

## 2016-11-07 ENCOUNTER — Ambulatory Visit: Payer: PPO | Admitting: Oncology

## 2016-11-07 VITALS — BP 124/74 | HR 65 | Temp 97.0°F | Resp 18 | Wt 225.1 lb

## 2016-11-07 DIAGNOSIS — K219 Gastro-esophageal reflux disease without esophagitis: Secondary | ICD-10-CM

## 2016-11-07 DIAGNOSIS — I4891 Unspecified atrial fibrillation: Secondary | ICD-10-CM

## 2016-11-07 DIAGNOSIS — I2510 Atherosclerotic heart disease of native coronary artery without angina pectoris: Secondary | ICD-10-CM | POA: Diagnosis not present

## 2016-11-07 DIAGNOSIS — M8580 Other specified disorders of bone density and structure, unspecified site: Secondary | ICD-10-CM | POA: Diagnosis not present

## 2016-11-07 DIAGNOSIS — Z853 Personal history of malignant neoplasm of breast: Secondary | ICD-10-CM | POA: Insufficient documentation

## 2016-11-07 DIAGNOSIS — Z87891 Personal history of nicotine dependence: Secondary | ICD-10-CM | POA: Diagnosis not present

## 2016-11-07 DIAGNOSIS — Z7982 Long term (current) use of aspirin: Secondary | ICD-10-CM | POA: Diagnosis not present

## 2016-11-07 DIAGNOSIS — R0602 Shortness of breath: Secondary | ICD-10-CM | POA: Diagnosis not present

## 2016-11-07 DIAGNOSIS — J449 Chronic obstructive pulmonary disease, unspecified: Secondary | ICD-10-CM | POA: Insufficient documentation

## 2016-11-07 DIAGNOSIS — F329 Major depressive disorder, single episode, unspecified: Secondary | ICD-10-CM | POA: Diagnosis not present

## 2016-11-07 DIAGNOSIS — Z803 Family history of malignant neoplasm of breast: Secondary | ICD-10-CM | POA: Diagnosis not present

## 2016-11-07 DIAGNOSIS — Z806 Family history of leukemia: Secondary | ICD-10-CM

## 2016-11-07 DIAGNOSIS — E039 Hypothyroidism, unspecified: Secondary | ICD-10-CM | POA: Diagnosis not present

## 2016-11-07 DIAGNOSIS — I10 Essential (primary) hypertension: Secondary | ICD-10-CM | POA: Diagnosis not present

## 2016-11-07 DIAGNOSIS — C50911 Malignant neoplasm of unspecified site of right female breast: Secondary | ICD-10-CM

## 2016-11-07 DIAGNOSIS — F419 Anxiety disorder, unspecified: Secondary | ICD-10-CM | POA: Insufficient documentation

## 2016-11-07 DIAGNOSIS — M129 Arthropathy, unspecified: Secondary | ICD-10-CM | POA: Insufficient documentation

## 2016-11-07 DIAGNOSIS — G2581 Restless legs syndrome: Secondary | ICD-10-CM | POA: Insufficient documentation

## 2016-11-07 DIAGNOSIS — I509 Heart failure, unspecified: Secondary | ICD-10-CM | POA: Insufficient documentation

## 2016-11-07 DIAGNOSIS — Z79899 Other long term (current) drug therapy: Secondary | ICD-10-CM | POA: Diagnosis not present

## 2016-11-07 NOTE — Progress Notes (Signed)
Pt in for 1 year follow up.  Denies any concerns at this time.

## 2016-11-13 ENCOUNTER — Ambulatory Visit: Payer: Self-pay | Admitting: Psychiatry

## 2016-11-15 DIAGNOSIS — M542 Cervicalgia: Secondary | ICD-10-CM | POA: Diagnosis not present

## 2016-11-15 DIAGNOSIS — Z23 Encounter for immunization: Secondary | ICD-10-CM | POA: Diagnosis not present

## 2016-11-20 ENCOUNTER — Encounter: Payer: Self-pay | Admitting: Psychiatry

## 2016-11-20 ENCOUNTER — Ambulatory Visit: Payer: PPO | Admitting: Psychiatry

## 2016-11-20 ENCOUNTER — Ambulatory Visit (INDEPENDENT_AMBULATORY_CARE_PROVIDER_SITE_OTHER): Payer: PPO | Admitting: Psychiatry

## 2016-11-20 VITALS — BP 146/77 | HR 69 | Temp 98.0°F | Wt 221.0 lb

## 2016-11-20 DIAGNOSIS — F39 Unspecified mood [affective] disorder: Secondary | ICD-10-CM

## 2016-11-20 MED ORDER — LAMOTRIGINE 25 MG PO TABS: 50.0000 mg | ORAL_TABLET | Freq: Every day | ORAL | 1 refills | Status: DC

## 2016-11-20 MED ORDER — FLUOXETINE HCL 40 MG PO CAPS: 40.0000 mg | ORAL_CAPSULE | Freq: Every day | ORAL | 1 refills | Status: DC

## 2016-11-20 MED ORDER — CLONAZEPAM 0.5 MG PO TABS: 0.2500 mg | ORAL_TABLET | Freq: Every day | ORAL | 3 refills | Status: DC

## 2016-11-20 NOTE — Progress Notes (Signed)
BH MD/PA/NP OP Progress Note  11/20/2016 1:16 PM Tonya Walton  MRN:  [id number]  Subjective:  Pt is a 66 year old married female who presented for the follow-up. She appeared calm during the interview. She reported that she has been compliant with her medications. She she was discussing in detail about the issues with her husband as she reported that he is taking his medications and is doing well. She is happy about his treatment. She stated that her relationship with him is also improving. Patient reported that she has been compliant with her medications. Patient reported that she sleeps well at night and does not have any acute issues.      She remains pleasant and cooperative during the interview.She denied having any suicidal homicidal ideations or plans.    Chief Complaint:  Chief Complaint    Follow-up; Medication Refill     Visit Diagnosis:     ICD-10-CM   1. Episodic mood disorder ([place]) F39     Past Medical History:  Past Medical History:  Diagnosis Date  . Anxiety   . Arthritis   . Atrial fibrillation, controlled ([place])   . Breast cancer ([place]) 2012   RT LUMPECTOMY  . CHF (congestive heart failure) ([place])   . COPD (chronic obstructive pulmonary disease) ([place])   . Coronary artery disease   . Depression   . Dysrhythmia    Afib  . GERD (gastroesophageal reflux disease)   . Headache   . Hypertension   . Hypothyroidism   . Neuromuscular disorder ([place])    nerve damage in legs  . Personal history of radiation therapy 2012   BREAST CA  . Restless leg syndrome   . Shortness of breath dyspnea     Past Surgical History:  Procedure Laterality Date  . APPENDECTOMY    . BREAST BIOPSY Left 07/2013   NEG  . BREAST BIOPSY Right 08/22/2016   US guided breast biopsy  . BREAST EXCISIONAL BIOPSY Right 2012   POS  . BREAST LUMPECTOMY     Right breast  . CARDIAC CATHETERIZATION N/A 03/02/2015   Procedure: Left Heart Cath and Coronary Angiography;  Surgeon: Teodoro Spray,  MD;  Location: [facility];  Service: Cardiovascular;  Laterality: N/A;  . CATARACT EXTRACTION W/PHACO Right 10/14/2014   Procedure: CATARACT EXTRACTION PHACO AND INTRAOCULAR LENS PLACEMENT (IOC);  Surgeon: Leandrew Koyanagi, MD;  Location: [facility];  Service: Ophthalmology;  Laterality: Right;  . CORONARY ARTERY BYPASS GRAFT N/A 04/01/2015   Procedure: CORONARY ARTERY BYPASS GRAFTING (CABG) x one , using left mammary artery;  Surgeon: Ivin Poot, MD;  Location: [facility];  Service: Open Heart Surgery;  Laterality: N/A;  . JOINT REPLACEMENT Right    Total Knee Replacement  . TEE WITHOUT CARDIOVERSION N/A 04/01/2015   Procedure: TRANSESOPHAGEAL ECHOCARDIOGRAM (TEE);  Surgeon: Ivin Poot, MD;  Location: [facility];  Service: Open Heart Surgery;  Laterality: N/A;  . TONSILLECTOMY    . TOTAL KNEE ARTHROPLASTY Right 06/10/2014   Procedure: TOTAL KNEE ARTHROPLASTY;  Surgeon: Dereck Leep, MD;  Location: [facility];  Service: Orthopedics;  Laterality: Right;  . TOTAL KNEE ARTHROPLASTY Left 09/02/2014   Procedure: TOTAL KNEE ARTHROPLASTY;  Surgeon: Dereck Leep, MD;  Location: [facility];  Service: Orthopedics;  Laterality: Left;   Family History:  Family History  Problem Relation Age of Onset  . Leukemia Mother   . Bone cancer Father   . Heart disease Brother   . Heart disease Brother   .  Diabetes Brother   . Hypertension Brother   . Diabetes Brother   . Hypertension Brother   . Heart disease Brother   . Breast cancer Paternal Aunt    Social History:  Social History   Social History  . Marital status: Married    Spouse name: N/A  . Number of children: N/A  . Years of education: N/A   Social History Main Topics  . Smoking status: Former Smoker    Packs/day: 1.00    Types: Cigarettes    Quit date: 04/30/1992  . Smokeless tobacco: Never Used  . Alcohol use No  . Drug use: No  . Sexual activity: Yes    Partners: Male    Birth control/ protection: None   Other  Topics Concern  . None   Social History Narrative  . None   Additional History:  She currently lives with her husband who is very helpful and has been taking care of her during her surgery. She reported that he is a good  nurse  Assessment:   Musculoskeletal: Strength & Muscle Tone: within normal limits Gait & Station: normal Patient leans: N/A  Psychiatric Specialty Exam: Medication Refill  Associated symptoms include chest pain. Pertinent negatives include no chills, congestion, nausea or rash.  Depression         Associated symptoms include does not have insomnia and no suicidal ideas.   Review of Systems  Constitutional: Negative for chills.  HENT: Negative for congestion and tinnitus.   Eyes: Negative for double vision.  Respiratory: Negative for hemoptysis.   Cardiovascular: Positive for chest pain. Negative for palpitations.  Gastrointestinal: Positive for diarrhea. Negative for nausea.  Genitourinary: Negative for urgency.  Musculoskeletal: Positive for joint pain. Negative for back pain.  Skin: Negative for rash.  Neurological: Negative for sensory change.  Endo/Heme/Allergies: Negative for environmental allergies.  Psychiatric/Behavioral: Positive for depression. Negative for substance abuse and suicidal ideas. The patient does not have insomnia.     Blood pressure (!) 146/77, pulse 69, temperature 98 F (36.7 C), temperature source Oral, weight 221 lb (100.2 kg).Body mass index is 35.94 kg/m.  General Appearance: Casual  Eye Contact:  Fair  Speech:  Normal Rate  Volume:  Normal  Mood:  Euthymic  Affect:  Appropriate  Thought Process:  Coherent  Orientation:  Full (Time, Place, and Person)  Thought Content:  WDL  Suicidal Thoughts:  No  Homicidal Thoughts:  No  Memory:  Immediate;   Fair  Judgement:  Fair  Insight:  Fair  Psychomotor Activity:  Normal  Concentration:  Fair  Recall:  [employer] of Knowledge: Fair  Language: Fair  Akathisia:  No   Handed:  Right  AIMS (if indicated):  none  Assets:  Communication Skills Desire for Improvement Social Support  ADL's:  Intact  Cognition: WNL  Sleep:  8-10   Is the patient at risk to self?  No. Has the patient been a risk to self in the past 6 months?  No. Has the patient been a risk to self within the distant past?  No. Is the patient a risk to others?  No. Has the patient been a risk to others in the past 6 months?  No. Has the patient been a risk to others within the distant past?  No.  Current Medications: Current Outpatient Prescriptions  Medication Sig Dispense Refill  . Artificial Tear Ointment (DRY EYES OP) Place 1 drop into both eyes at bedtime.    Marland Kitchen aspirin EC  325 MG EC tablet Take 1 tablet (325 mg total) by mouth daily. 30 tablet 0  . atorvastatin (LIPITOR) 40 MG tablet Take 1 tablet (40 mg total) by mouth daily at 6 PM. 30 tablet 3  . clonazePAM (KLONOPIN) 0.5 MG tablet Take 0.5 tablets (0.25 mg total) by mouth at bedtime. 15 tablet 3  . Cyanocobalamin (B-12 COMPLIANCE INJECTION) 1000 MCG/ML KIT Inject 1,000 mcg as directed every Monday, Wednesday, and Friday.     . Cyanocobalamin ([employer] B-12) 1000 MCG/15ML LIQD Take 1,000 mcg by mouth every Monday, Wednesday, and Friday. Rotates weekly with the injection    . cyclobenzaprine (FLEXERIL) 10 MG tablet Take 10 mg by mouth daily.     Marland Kitchen esomeprazole (NEXIUM) 40 MG capsule Take by mouth.    . ferrous [id number] C-folic acid (TRINSICON / FOLTRIN) capsule Take 1 capsule by mouth 3 (three) times daily after meals. 90 capsule 3  . FLUoxetine (PROZAC) 40 MG capsule Take 1 capsule (40 mg total) by mouth daily. 90 capsule 1  . furosemide (LASIX) 40 MG tablet Take 1 tablet (40 mg total) by mouth daily as needed for fluid. 30 tablet 2  . gabapentin (NEURONTIN) 300 MG capsule Take 1,500 mg by mouth 2 (two) times daily. 1500 mg AM, 1500 mg PM    . lamoTRIgine (LAMICTAL) 25 MG tablet Take 2 tablets (50 mg total) by mouth daily.  180 tablet 1  . levothyroxine (SYNTHROID, LEVOTHROID) 25 MCG tablet Take 25 mcg by mouth daily.     Marland Kitchen lisinopril (PRINIVIL,ZESTRIL) 5 MG tablet Take 1 tablet (5 mg total) by mouth daily. 30 tablet 3  . meloxicam (MOBIC) 15 MG tablet     . metoprolol tartrate (LOPRESSOR) 25 MG tablet Take 0.5 tablets (12.5 mg total) by mouth 2 (two) times daily. 60 tablet 3  . montelukast (SINGULAIR) 10 MG tablet Take 10 mg by mouth at bedtime.    Marland Kitchen oxybutynin (DITROPAN) 5 MG tablet Take 5 mg by mouth 3 (three) times daily as needed for bladder spasms.    . pantoprazole (PROTONIX) 40 MG tablet Take 40 mg by mouth daily. AM    . potassium chloride SA (K-DUR,KLOR-CON) 20 MEQ tablet Take 1 tablet (20 mEq total) by mouth daily. If taking Lasix 30 tablet 3  . pramipexole (MIRAPEX) 0.25 MG tablet Take 0.25-0.5 mg by mouth 3 (three) times daily. Pt takes two tablets in the morning, one tablet at noon, and two tablets at bedtime.     No current facility-administered medications for this visit.     Medical Decision Making:  Established Problem, Stable/Improving (1) and Review of Last Therapy Session (1)  Treatment Plan Summary:Medication management  Depression Continue on Prozac 40 mg in the morning- 90 day supply given  Continue  lamotrigine 50  mg daily for her mood stabilization and discuss about the risk of rash and Katherina Right syndrome.  Anxiety Advised patient to decrease  Klonopin 0.25  mg at bed.   Follow-up She will follow-up in 3 months    More than 50% of the time spent in psychoeducation, counseling and coordination of care.     This note was generated in part or whole with voice recognition software. Voice regonition is usually quite accurate but there are transcription errors that can and very often do occur. I apologize for any typographical errors that were not detected and corrected.    Rainey Pines, MD  11/20/2016, 1:16 PM

## 2017-01-29 DIAGNOSIS — J45902 Unspecified asthma with status asthmaticus: Secondary | ICD-10-CM | POA: Diagnosis not present

## 2017-01-29 DIAGNOSIS — J40 Bronchitis, not specified as acute or chronic: Secondary | ICD-10-CM | POA: Diagnosis not present

## 2017-02-15 DIAGNOSIS — D510 Vitamin B12 deficiency anemia due to intrinsic factor deficiency: Secondary | ICD-10-CM | POA: Diagnosis not present

## 2017-02-15 DIAGNOSIS — E782 Mixed hyperlipidemia: Secondary | ICD-10-CM | POA: Diagnosis not present

## 2017-02-15 DIAGNOSIS — R739 Hyperglycemia, unspecified: Secondary | ICD-10-CM | POA: Diagnosis not present

## 2017-02-15 DIAGNOSIS — D500 Iron deficiency anemia secondary to blood loss (chronic): Secondary | ICD-10-CM | POA: Diagnosis not present

## 2017-02-27 DIAGNOSIS — Z0000 Encounter for general adult medical examination without abnormal findings: Secondary | ICD-10-CM | POA: Diagnosis not present

## 2017-02-27 DIAGNOSIS — J431 Panlobular emphysema: Secondary | ICD-10-CM | POA: Diagnosis not present

## 2017-02-27 DIAGNOSIS — I480 Paroxysmal atrial fibrillation: Secondary | ICD-10-CM | POA: Diagnosis not present

## 2017-02-27 DIAGNOSIS — R739 Hyperglycemia, unspecified: Secondary | ICD-10-CM | POA: Diagnosis not present

## 2017-02-27 DIAGNOSIS — D510 Vitamin B12 deficiency anemia due to intrinsic factor deficiency: Secondary | ICD-10-CM | POA: Diagnosis not present

## 2017-02-27 DIAGNOSIS — E782 Mixed hyperlipidemia: Secondary | ICD-10-CM | POA: Diagnosis not present

## 2017-03-26 ENCOUNTER — Ambulatory Visit: Payer: PPO | Admitting: Psychiatry

## 2017-03-26 ENCOUNTER — Other Ambulatory Visit: Payer: Self-pay

## 2017-03-26 ENCOUNTER — Encounter: Payer: Self-pay | Admitting: Psychiatry

## 2017-03-26 DIAGNOSIS — F39 Unspecified mood [affective] disorder: Secondary | ICD-10-CM | POA: Diagnosis not present

## 2017-03-26 MED ORDER — LAMOTRIGINE 25 MG PO TABS: 50.0000 mg | ORAL_TABLET | Freq: Every day | ORAL | 1 refills | Status: DC

## 2017-03-26 MED ORDER — CLONAZEPAM 0.5 MG PO TABS: 0.2500 mg | ORAL_TABLET | Freq: Every day | ORAL | 3 refills | Status: DC

## 2017-03-26 MED ORDER — FLUOXETINE HCL 40 MG PO CAPS: 40.0000 mg | ORAL_CAPSULE | Freq: Every day | ORAL | 1 refills | Status: DC

## 2017-03-26 NOTE — Progress Notes (Signed)
BH MD/PA/NP OP Progress Note  03/26/2017 9:05 AM Tonya Walton  MRN:  [id number]  Subjective:  Pt is a 67 year old married female who presented for the follow-up. She appeared tired during the interview. She reported that her brother-in-law was diagnosed with cancer which has spread to his poor. He is currently in the hospital and they have been concerned about him as he is being followed by the hospitalist. She reported that she has been spending a lot of time with him in the hospital. She reported that his wife died 64 years ago. He has a girlfriend who has been supportive. She reported that she has been supportive of him. She reported that she has been compliant with her medications and takes high doses of gabapentin related to her leg problems. She was prescribed Ambien by her primary care physician to help with her sleep problems. Patient reported that she has been sleeping well with the help of 0.25 mg Klonopin and Ambien. She does not have any side effects of the medications. We discussed about her medications at length. Also called the pharmacy as she had an entry of  Zoloft in her medication list and they reported that she does not take Zoloft at this time.      She remains pleasant and cooperative during the interview.She denied having any suicidal homicidal ideations or plans.    Chief Complaint:  Chief Complaint    Follow-up; Medication Refill     Visit Diagnosis:     ICD-10-CM   1. Episodic mood disorder ([place]) F39     Past Medical History:  Past Medical History:  Diagnosis Date  . Anxiety   . Arthritis   . Atrial fibrillation, controlled ([place])   . Breast cancer ([place]) 2012   RT LUMPECTOMY  . CHF (congestive heart failure) ([place])   . COPD (chronic obstructive pulmonary disease) ([place])   . Coronary artery disease   . Depression   . Dysrhythmia    Afib  . GERD (gastroesophageal reflux disease)   . Headache   . Hypertension   . Hypothyroidism   . Neuromuscular disorder  ([place])    nerve damage in legs  . Personal history of radiation therapy 2012   BREAST CA  . Restless leg syndrome   . Shortness of breath dyspnea     Past Surgical History:  Procedure Laterality Date  . APPENDECTOMY    . BREAST BIOPSY Left 07/2013   NEG  . BREAST BIOPSY Right 08/22/2016   US guided breast biopsy  . BREAST EXCISIONAL BIOPSY Right 2012   POS  . BREAST LUMPECTOMY     Right breast  . CARDIAC CATHETERIZATION N/A 03/02/2015   Procedure: Left Heart Cath and Coronary Angiography;  Surgeon: Teodoro Spray, MD;  Location: [facility];  Service: Cardiovascular;  Laterality: N/A;  . CATARACT EXTRACTION W/PHACO Right 10/14/2014   Procedure: CATARACT EXTRACTION PHACO AND INTRAOCULAR LENS PLACEMENT (IOC);  Surgeon: Leandrew Koyanagi, MD;  Location: [facility];  Service: Ophthalmology;  Laterality: Right;  . CORONARY ARTERY BYPASS GRAFT N/A 04/01/2015   Procedure: CORONARY ARTERY BYPASS GRAFTING (CABG) x one , using left mammary artery;  Surgeon: Ivin Poot, MD;  Location: [facility];  Service: Open Heart Surgery;  Laterality: N/A;  . JOINT REPLACEMENT Right    Total Knee Replacement  . TEE WITHOUT CARDIOVERSION N/A 04/01/2015   Procedure: TRANSESOPHAGEAL ECHOCARDIOGRAM (TEE);  Surgeon: Ivin Poot, MD;  Location: [facility];  Service: Open Heart Surgery;  Laterality: N/A;  . TONSILLECTOMY    . TOTAL KNEE ARTHROPLASTY Right 06/10/2014   Procedure: TOTAL KNEE ARTHROPLASTY;  Surgeon: Dereck Leep, MD;  Location: [facility];  Service: Orthopedics;  Laterality: Right;  . TOTAL KNEE ARTHROPLASTY Left 09/02/2014   Procedure: TOTAL KNEE ARTHROPLASTY;  Surgeon: Dereck Leep, MD;  Location: [facility];  Service: Orthopedics;  Laterality: Left;   Family History:  Family History  Problem Relation Age of Onset  . Leukemia Mother   . Bone cancer Father   . Heart disease Brother   . Heart disease Brother   . Diabetes Brother   . Hypertension Brother   . Diabetes Brother   .  Hypertension Brother   . Heart disease Brother   . Breast cancer Paternal Aunt    Social History:  Social History   Socioeconomic History  . Marital status: Married    Spouse name: None  . Number of children: None  . Years of education: None  . Highest education level: None  Social Needs  . Financial resource strain: None  . Food insecurity - worry: None  . Food insecurity - inability: None  . Transportation needs - medical: None  . Transportation needs - non-medical: None  Occupational History  . None  Tobacco Use  . Smoking status: Former Smoker    Packs/day: 1.00    Types: Cigarettes    Last attempt to quit: 04/30/1992    Years since quitting: 24.9  . Smokeless tobacco: Never Used  Substance and Sexual Activity  . Alcohol use: No  . Drug use: No  . Sexual activity: Yes    Partners: Male    Birth control/protection: None  Other Topics Concern  . None  Social History Narrative  . None   Additional History:  She currently lives with her husband who is very helpful and has been taking care of her during her surgery. She reported that he is a good  nurse  Assessment:   Musculoskeletal: Strength & Muscle Tone: within normal limits Gait & Station: normal Patient leans: N/A  Psychiatric Specialty Exam: Medication Refill  Associated symptoms include chest pain. Pertinent negatives include no chills, congestion, nausea or rash.  Depression         Associated symptoms include does not have insomnia and no suicidal ideas.   Review of Systems  Constitutional: Negative for chills.  HENT: Negative for congestion and tinnitus.   Eyes: Negative for double vision.  Respiratory: Negative for hemoptysis.   Cardiovascular: Positive for chest pain. Negative for palpitations.  Gastrointestinal: Positive for diarrhea. Negative for nausea.  Genitourinary: Negative for urgency.  Musculoskeletal: Positive for joint pain. Negative for back pain.  Skin: Negative for rash.   Neurological: Negative for sensory change.  Endo/Heme/Allergies: Negative for environmental allergies.  Psychiatric/Behavioral: Positive for depression. Negative for substance abuse and suicidal ideas. The patient does not have insomnia.     There were no vitals taken for this visit.There is no height or weight on file to calculate BMI.  General Appearance: Casual  Eye Contact:  Fair  Speech:  Normal Rate  Volume:  Normal  Mood:  Euthymic  Affect:  Appropriate  Thought Process:  Coherent  Orientation:  Full (Time, Place, and Person)  Thought Content:  WDL  Suicidal Thoughts:  No  Homicidal Thoughts:  No  Memory:  Immediate;   Fair  Judgement:  Fair  Insight:  Fair  Psychomotor Activity:  Normal  Concentration:  Fair  Recall:  Fair  Fund of Knowledge: Fair  Language: Fair  Akathisia:  No  Handed:  Right  AIMS (if indicated):  none  Assets:  Communication Skills Desire for Improvement Social Support  ADL's:  Intact  Cognition: WNL  Sleep:  8-10   Is the patient at risk to self?  No. Has the patient been a risk to self in the past 6 months?  No. Has the patient been a risk to self within the distant past?  No. Is the patient a risk to others?  No. Has the patient been a risk to others in the past 6 months?  No. Has the patient been a risk to others within the distant past?  No.  Current Medications: Current Outpatient Medications  Medication Sig Dispense Refill  . Artificial Tear Ointment (DRY EYES OP) Place 1 drop into both eyes at bedtime.    Marland Kitchen aspirin EC 81 MG tablet Take 81 mg by mouth.    Marland Kitchen atorvastatin (LIPITOR) 40 MG tablet Take 1 tablet (40 mg total) by mouth daily at 6 PM. 30 tablet 3  . Cholecalciferol (VITAMIN D3) 2000 units capsule Take by mouth.    . clonazePAM (KLONOPIN) 0.5 MG tablet Take 0.5 tablets (0.25 mg total) by mouth at bedtime. 15 tablet 3  . Cyanocobalamin (B-12 COMPLIANCE INJECTION) 1000 MCG/ML KIT Inject 1,000 mcg as directed every Monday,  Wednesday, and Friday.     . cyclobenzaprine (FLEXERIL) 10 MG tablet Take 10 mg by mouth daily.     Marland Kitchen esomeprazole (NEXIUM) 40 MG capsule Take by mouth.    . ferrous [id number] C-folic acid (TRINSICON / FOLTRIN) capsule Take 1 capsule by mouth 3 (three) times daily after meals. 90 capsule 3  . FLUoxetine (PROZAC) 40 MG capsule Take 1 capsule (40 mg total) by mouth daily. 90 capsule 1  . furosemide (LASIX) 40 MG tablet Take 1 tablet (40 mg total) by mouth daily as needed for fluid. 30 tablet 2  . gabapentin (NEURONTIN) 300 MG capsule Take 1,500 mg by mouth 2 (two) times daily. 1500 mg AM, 1500 mg PM    . lamoTRIgine (LAMICTAL) 25 MG tablet Take 2 tablets (50 mg total) by mouth daily. 180 tablet 1  . levothyroxine (SYNTHROID, LEVOTHROID) 25 MCG tablet Take 25 mcg by mouth daily.     Marland Kitchen lisinopril (PRINIVIL,ZESTRIL) 5 MG tablet Take 1 tablet (5 mg total) by mouth daily. 30 tablet 3  . loratadine (CLARITIN) 10 MG tablet Take by mouth.    . meloxicam (MOBIC) 15 MG tablet     . metoprolol tartrate (LOPRESSOR) 25 MG tablet Take 0.5 tablets (12.5 mg total) by mouth 2 (two) times daily. 60 tablet 3  . montelukast (SINGULAIR) 10 MG tablet Take 10 mg by mouth at bedtime.    Marland Kitchen oxybutynin (DITROPAN) 5 MG tablet Take 5 mg by mouth 3 (three) times daily as needed for bladder spasms.    . pantoprazole (PROTONIX) 40 MG tablet Take 40 mg by mouth daily. AM    . potassium chloride SA (K-DUR,KLOR-CON) 20 MEQ tablet Take 1 tablet (20 mEq total) by mouth daily. If taking Lasix 30 tablet 3  . pramipexole (MIRAPEX) 0.25 MG tablet Take 0.25-0.5 mg by mouth 3 (three) times daily. Pt takes two tablets in the morning, one tablet at noon, and two tablets at bedtime.    . sertraline (ZOLOFT) 100 MG tablet Take 200 mg by mouth.    . zolpidem (AMBIEN) 5 MG tablet TAKE 1 TABLET BY MOUTH AT BEDTIME  No current facility-administered medications for this visit.     Medical Decision Making:  Established Problem,  Stable/Improving (1) and Review of Last Therapy Session (1)  Treatment Plan Summary:Medication management  Depression Continue on Prozac 40 mg in the morning- 90 day supply given  Continue  lamotrigine 50  mg daily for her mood stabilization and discuss about the risk of rash and Katherina Right syndrome.  Anxiety   Klonopin 0.25  mg at bed.   Follow-up She will follow-up in 3 months    More than 50% of the time spent in psychoeducation, counseling and coordination of care.     This note was generated in part or whole with voice recognition software. Voice regonition is usually quite accurate but there are transcription errors that can and very often do occur. I apologize for any typographical errors that were not detected and corrected.    Rainey Pines, MD  03/26/2017, 9:05 AM

## 2017-03-29 DIAGNOSIS — I10 Essential (primary) hypertension: Secondary | ICD-10-CM | POA: Diagnosis not present

## 2017-03-29 DIAGNOSIS — H26491 Other secondary cataract, right eye: Secondary | ICD-10-CM | POA: Diagnosis not present

## 2017-03-29 DIAGNOSIS — H2512 Age-related nuclear cataract, left eye: Secondary | ICD-10-CM | POA: Diagnosis not present

## 2017-03-29 DIAGNOSIS — H5213 Myopia, bilateral: Secondary | ICD-10-CM | POA: Diagnosis not present

## 2017-03-29 DIAGNOSIS — H52223 Regular astigmatism, bilateral: Secondary | ICD-10-CM | POA: Diagnosis not present

## 2017-03-29 DIAGNOSIS — H04123 Dry eye syndrome of bilateral lacrimal glands: Secondary | ICD-10-CM | POA: Diagnosis not present

## 2017-03-29 DIAGNOSIS — Z961 Presence of intraocular lens: Secondary | ICD-10-CM | POA: Diagnosis not present

## 2017-04-03 ENCOUNTER — Other Ambulatory Visit: Payer: Self-pay | Admitting: Psychiatry

## 2017-05-14 DIAGNOSIS — R55 Syncope and collapse: Secondary | ICD-10-CM | POA: Diagnosis not present

## 2017-05-14 DIAGNOSIS — Z79899 Other long term (current) drug therapy: Secondary | ICD-10-CM | POA: Diagnosis not present

## 2017-05-14 DIAGNOSIS — R634 Abnormal weight loss: Secondary | ICD-10-CM | POA: Diagnosis not present

## 2017-05-22 ENCOUNTER — Other Ambulatory Visit: Payer: Self-pay | Admitting: Internal Medicine

## 2017-05-22 ENCOUNTER — Ambulatory Visit
Admission: RE | Admit: 2017-05-22 | Discharge: 2017-05-22 | Disposition: A | Discharge status: Home / Self Care | Payer: PPO | Source: Ambulatory Visit | Attending: Internal Medicine | Admitting: Internal Medicine

## 2017-05-22 DIAGNOSIS — G40919 Epilepsy, unspecified, intractable, without status epilepticus: Secondary | ICD-10-CM

## 2017-05-22 DIAGNOSIS — R001 Bradycardia, unspecified: Secondary | ICD-10-CM | POA: Diagnosis not present

## 2017-05-22 DIAGNOSIS — I480 Paroxysmal atrial fibrillation: Secondary | ICD-10-CM | POA: Diagnosis present

## 2017-05-22 DIAGNOSIS — W19XXXA Unspecified fall, initial encounter: Secondary | ICD-10-CM

## 2017-05-22 DIAGNOSIS — N179 Acute kidney failure, unspecified: Secondary | ICD-10-CM | POA: Diagnosis not present

## 2017-05-22 DIAGNOSIS — Z7982 Long term (current) use of aspirin: Secondary | ICD-10-CM | POA: Diagnosis not present

## 2017-05-22 DIAGNOSIS — Z87891 Personal history of nicotine dependence: Secondary | ICD-10-CM | POA: Diagnosis not present

## 2017-05-22 DIAGNOSIS — Z96653 Presence of artificial knee joint, bilateral: Secondary | ICD-10-CM | POA: Diagnosis not present

## 2017-05-22 DIAGNOSIS — Z853 Personal history of malignant neoplasm of breast: Secondary | ICD-10-CM | POA: Diagnosis not present

## 2017-05-22 DIAGNOSIS — E860 Dehydration: Secondary | ICD-10-CM | POA: Diagnosis not present

## 2017-05-22 DIAGNOSIS — R531 Weakness: Secondary | ICD-10-CM | POA: Diagnosis not present

## 2017-05-22 DIAGNOSIS — I2510 Atherosclerotic heart disease of native coronary artery without angina pectoris: Secondary | ICD-10-CM | POA: Diagnosis present

## 2017-05-22 DIAGNOSIS — I509 Heart failure, unspecified: Secondary | ICD-10-CM | POA: Diagnosis not present

## 2017-05-22 DIAGNOSIS — Z79899 Other long term (current) drug therapy: Secondary | ICD-10-CM | POA: Diagnosis not present

## 2017-05-22 DIAGNOSIS — F419 Anxiety disorder, unspecified: Secondary | ICD-10-CM | POA: Diagnosis present

## 2017-05-22 DIAGNOSIS — Z923 Personal history of irradiation: Secondary | ICD-10-CM

## 2017-05-22 DIAGNOSIS — N189 Chronic kidney disease, unspecified: Secondary | ICD-10-CM | POA: Diagnosis present

## 2017-05-22 DIAGNOSIS — E039 Hypothyroidism, unspecified: Secondary | ICD-10-CM | POA: Diagnosis not present

## 2017-05-22 DIAGNOSIS — M1990 Unspecified osteoarthritis, unspecified site: Secondary | ICD-10-CM | POA: Diagnosis not present

## 2017-05-22 DIAGNOSIS — G2581 Restless legs syndrome: Secondary | ICD-10-CM | POA: Diagnosis present

## 2017-05-22 DIAGNOSIS — G40419 Other generalized epilepsy and epileptic syndromes, intractable, without status epilepticus: Secondary | ICD-10-CM

## 2017-05-22 DIAGNOSIS — N19 Unspecified kidney failure: Secondary | ICD-10-CM | POA: Diagnosis not present

## 2017-05-22 DIAGNOSIS — Z8673 Personal history of transient ischemic attack (TIA), and cerebral infarction without residual deficits: Secondary | ICD-10-CM

## 2017-05-22 DIAGNOSIS — R55 Syncope and collapse: Secondary | ICD-10-CM

## 2017-05-22 DIAGNOSIS — T501X5A Adverse effect of loop [high-ceiling] diuretics, initial encounter: Secondary | ICD-10-CM | POA: Diagnosis present

## 2017-05-22 DIAGNOSIS — R42 Dizziness and giddiness: Secondary | ICD-10-CM | POA: Diagnosis not present

## 2017-05-22 DIAGNOSIS — K219 Gastro-esophageal reflux disease without esophagitis: Secondary | ICD-10-CM | POA: Diagnosis not present

## 2017-05-22 DIAGNOSIS — J449 Chronic obstructive pulmonary disease, unspecified: Secondary | ICD-10-CM | POA: Diagnosis present

## 2017-05-22 DIAGNOSIS — I6521 Occlusion and stenosis of right carotid artery: Secondary | ICD-10-CM | POA: Diagnosis not present

## 2017-05-22 MED ORDER — GADOBENATE DIMEGLUMINE 529 MG/ML IV SOLN
20.0000 mL | Freq: Once | INTRAVENOUS | Status: AC | PRN
  Administered 2017-05-22: 20 mL via INTRAVENOUS

## 2017-05-23 DIAGNOSIS — R55 Syncope and collapse: Secondary | ICD-10-CM | POA: Diagnosis not present

## 2017-05-25 ENCOUNTER — Other Ambulatory Visit: Payer: Self-pay

## 2017-05-25 ENCOUNTER — Inpatient Hospital Stay
Admission: EM | Admit: 2017-05-25 | Discharge: 2017-05-27 | DRG: 312 | Disposition: A | Discharge status: Home / Self Care | Payer: PPO | Attending: Internal Medicine | Admitting: Internal Medicine

## 2017-05-25 ENCOUNTER — Inpatient Hospital Stay: Payer: PPO

## 2017-05-25 ENCOUNTER — Encounter: Payer: Self-pay | Admitting: Emergency Medicine

## 2017-05-25 DIAGNOSIS — Z87891 Personal history of nicotine dependence: Secondary | ICD-10-CM | POA: Diagnosis not present

## 2017-05-25 DIAGNOSIS — Z853 Personal history of malignant neoplasm of breast: Secondary | ICD-10-CM | POA: Diagnosis not present

## 2017-05-25 DIAGNOSIS — J449 Chronic obstructive pulmonary disease, unspecified: Secondary | ICD-10-CM | POA: Diagnosis present

## 2017-05-25 DIAGNOSIS — R55 Syncope and collapse: Secondary | ICD-10-CM

## 2017-05-25 DIAGNOSIS — E860 Dehydration: Secondary | ICD-10-CM | POA: Diagnosis present

## 2017-05-25 DIAGNOSIS — Z7982 Long term (current) use of aspirin: Secondary | ICD-10-CM | POA: Diagnosis not present

## 2017-05-25 DIAGNOSIS — N179 Acute kidney failure, unspecified: Secondary | ICD-10-CM

## 2017-05-25 DIAGNOSIS — I509 Heart failure, unspecified: Secondary | ICD-10-CM | POA: Diagnosis present

## 2017-05-25 DIAGNOSIS — K219 Gastro-esophageal reflux disease without esophagitis: Secondary | ICD-10-CM | POA: Diagnosis present

## 2017-05-25 DIAGNOSIS — Z79899 Other long term (current) drug therapy: Secondary | ICD-10-CM | POA: Diagnosis not present

## 2017-05-25 DIAGNOSIS — F419 Anxiety disorder, unspecified: Secondary | ICD-10-CM | POA: Diagnosis present

## 2017-05-25 DIAGNOSIS — R001 Bradycardia, unspecified: Secondary | ICD-10-CM | POA: Diagnosis present

## 2017-05-25 DIAGNOSIS — N189 Chronic kidney disease, unspecified: Secondary | ICD-10-CM | POA: Diagnosis present

## 2017-05-25 DIAGNOSIS — I480 Paroxysmal atrial fibrillation: Secondary | ICD-10-CM | POA: Diagnosis present

## 2017-05-25 DIAGNOSIS — Z923 Personal history of irradiation: Secondary | ICD-10-CM | POA: Diagnosis not present

## 2017-05-25 DIAGNOSIS — I2510 Atherosclerotic heart disease of native coronary artery without angina pectoris: Secondary | ICD-10-CM | POA: Diagnosis present

## 2017-05-25 DIAGNOSIS — M1990 Unspecified osteoarthritis, unspecified site: Secondary | ICD-10-CM | POA: Diagnosis present

## 2017-05-25 DIAGNOSIS — G2581 Restless legs syndrome: Secondary | ICD-10-CM | POA: Diagnosis present

## 2017-05-25 DIAGNOSIS — Z96653 Presence of artificial knee joint, bilateral: Secondary | ICD-10-CM | POA: Diagnosis present

## 2017-05-25 DIAGNOSIS — E039 Hypothyroidism, unspecified: Secondary | ICD-10-CM | POA: Diagnosis present

## 2017-05-25 DIAGNOSIS — T501X5A Adverse effect of loop [high-ceiling] diuretics, initial encounter: Secondary | ICD-10-CM | POA: Diagnosis present

## 2017-05-25 LAB — URINALYSIS, COMPLETE (UACMP) WITH MICROSCOPIC
BILIRUBIN URINE: NEGATIVE
Glucose, UA: NEGATIVE mg/dL
Hgb urine dipstick: NEGATIVE
Ketones, ur: NEGATIVE mg/dL
LEUKOCYTES UA: NEGATIVE
Nitrite: NEGATIVE
PH: 5 (ref 5.0–8.0)
PROTEIN: NEGATIVE mg/dL
Specific Gravity, Urine: 1.009 (ref 1.005–1.030)

## 2017-05-25 LAB — BASIC METABOLIC PANEL
Anion gap: 11 (ref 5–15)
BUN: 63 mg/dL — ABNORMAL HIGH (ref 6–20)
CHLORIDE: 100 mmol/L — AB (ref 101–111)
CO2: 26 mmol/L (ref 22–32)
CREATININE: 2.12 mg/dL — AB (ref 0.44–1.00)
Calcium: 9.1 mg/dL (ref 8.9–10.3)
GFR calc non Af Amer: 23 mL/min — ABNORMAL LOW (ref >60)
GFR, EST AFRICAN AMERICAN: 26 mL/min — AB (ref >60)
Glucose, Bld: 107 mg/dL — ABNORMAL HIGH (ref 65–99)
POTASSIUM: 4.8 mmol/L (ref 3.5–5.1)
SODIUM: 137 mmol/L (ref 135–145)

## 2017-05-25 LAB — CBC
HEMATOCRIT: 34.9 % — AB (ref 35.0–47.0)
HEMATOCRIT: 35.2 % (ref 35.0–47.0)
HEMOGLOBIN: 11.7 g/dL — AB (ref 12.0–16.0)
HEMOGLOBIN: 11.8 g/dL — AB (ref 12.0–16.0)
MCH: 29.3 pg (ref 26.0–34.0)
MCH: 29.4 pg (ref 26.0–34.0)
MCHC: 33.4 g/dL (ref 32.0–36.0)
MCHC: 33.6 g/dL (ref 32.0–36.0)
MCV: 87.6 fL (ref 80.0–100.0)
MCV: 87.4 fL (ref 80.0–100.0)
PLATELETS: 252 10*3/uL (ref 150–440)
Platelets: 242 10*3/uL (ref 150–440)
RBC: 3.98 MIL/uL (ref 3.80–5.20)
RBC: 4.03 MIL/uL (ref 3.80–5.20)
RDW: 14.6 % — ABNORMAL HIGH (ref 11.5–14.5)
RDW: 14.8 % — AB (ref 11.5–14.5)
WBC: 9.1 10*3/uL (ref 3.6–11.0)
WBC: 8 10*3/uL (ref 3.6–11.0)

## 2017-05-25 LAB — CREATININE, SERUM
Creatinine, Ser: 1.67 mg/dL — ABNORMAL HIGH (ref 0.44–1.00)
GFR calc non Af Amer: 30 mL/min — ABNORMAL LOW (ref >60)
GFR, EST AFRICAN AMERICAN: 35 mL/min — AB (ref >60)

## 2017-05-25 LAB — TROPONIN I
Troponin I: <0.03 ng/mL (ref <0.03)
Troponin I: <0.03 ng/mL (ref <0.03)

## 2017-05-25 MED ORDER — LAMOTRIGINE 100 MG PO TABS
50.0000 mg | ORAL_TABLET | Freq: Every day | ORAL | Status: DC
  Administered 2017-05-26 – 2017-05-27 (×2): 50 mg via ORAL
  Filled 2017-05-25 (×2): qty 1

## 2017-05-25 MED ORDER — SENNOSIDES-DOCUSATE SODIUM 8.6-50 MG PO TABS: 1.0000 | ORAL_TABLET | Freq: Every evening | ORAL | Status: DC | PRN

## 2017-05-25 MED ORDER — SODIUM CHLORIDE 0.9 % IV SOLN
INTRAVENOUS | Status: DC
  Administered 2017-05-25 – 2017-05-26 (×2): via INTRAVENOUS

## 2017-05-25 MED ORDER — PANTOPRAZOLE SODIUM 40 MG PO TBEC
40.0000 mg | DELAYED_RELEASE_TABLET | Freq: Every day | ORAL | Status: DC
  Administered 2017-05-26 – 2017-05-27 (×2): 40 mg via ORAL
  Filled 2017-05-25 (×2): qty 1

## 2017-05-25 MED ORDER — ONDANSETRON HCL 4 MG/2ML IJ SOLN: 4.0000 mg | Freq: Four times a day (QID) | INTRAMUSCULAR | Status: DC | PRN

## 2017-05-25 MED ORDER — FE FUMARATE-B12-VIT C-FA-IFC PO CAPS
1.0000 | ORAL_CAPSULE | Freq: Three times a day (TID) | ORAL | Status: DC
  Administered 2017-05-25 – 2017-05-27 (×5): 1 via ORAL
  Filled 2017-05-25 (×8): qty 1

## 2017-05-25 MED ORDER — CYCLOBENZAPRINE HCL 10 MG PO TABS
10.0000 mg | ORAL_TABLET | Freq: Every day | ORAL | Status: DC
  Administered 2017-05-26 – 2017-05-27 (×2): 10 mg via ORAL
  Filled 2017-05-25 (×2): qty 1

## 2017-05-25 MED ORDER — GABAPENTIN 400 MG PO CAPS
1500.0000 mg | ORAL_CAPSULE | Freq: Two times a day (BID) | ORAL | Status: DC
  Administered 2017-05-25 – 2017-05-26 (×3): 1500 mg via ORAL
  Filled 2017-05-25 (×4): qty 3

## 2017-05-25 MED ORDER — LEVOTHYROXINE SODIUM 25 MCG PO TABS
25.0000 ug | ORAL_TABLET | Freq: Every day | ORAL | Status: DC
  Administered 2017-05-26 – 2017-05-27 (×2): 25 ug via ORAL
  Filled 2017-05-25 (×2): qty 1

## 2017-05-25 MED ORDER — CYANOCOBALAMIN 1000 MCG/ML IJ SOLN: 1000.0000 ug | INTRAMUSCULAR | Status: DC

## 2017-05-25 MED ORDER — ATORVASTATIN CALCIUM 20 MG PO TABS
40.0000 mg | ORAL_TABLET | Freq: Every day | ORAL | Status: DC
  Administered 2017-05-25 – 2017-05-26 (×2): 40 mg via ORAL
  Filled 2017-05-25 (×2): qty 2

## 2017-05-25 MED ORDER — FLUOXETINE HCL 40 MG PO CAPS: 40.0000 mg | ORAL_CAPSULE | Freq: Every day | ORAL | Status: DC

## 2017-05-25 MED ORDER — CLONAZEPAM 0.5 MG PO TABS
0.2500 mg | ORAL_TABLET | Freq: Every day | ORAL | Status: DC
  Administered 2017-05-25 – 2017-05-26 (×2): 0.25 mg via ORAL
  Filled 2017-05-25 (×2): qty 1

## 2017-05-25 MED ORDER — PRAMIPEXOLE DIHYDROCHLORIDE 0.25 MG PO TABS
0.2500 mg | ORAL_TABLET | ORAL | Status: DC
  Administered 2017-05-26: 0.25 mg via ORAL
  Filled 2017-05-25: qty 1

## 2017-05-25 MED ORDER — ASPIRIN EC 81 MG PO TBEC
81.0000 mg | DELAYED_RELEASE_TABLET | Freq: Every day | ORAL | Status: DC
  Administered 2017-05-26 – 2017-05-27 (×2): 81 mg via ORAL
  Filled 2017-05-25 (×2): qty 1

## 2017-05-25 MED ORDER — ONDANSETRON HCL 4 MG PO TABS: 4.0000 mg | ORAL_TABLET | Freq: Four times a day (QID) | ORAL | Status: DC | PRN

## 2017-05-25 MED ORDER — OXYBUTYNIN CHLORIDE 5 MG PO TABS: 5.0000 mg | ORAL_TABLET | Freq: Three times a day (TID) | ORAL | Status: DC | PRN

## 2017-05-25 MED ORDER — PRAMIPEXOLE DIHYDROCHLORIDE 0.25 MG PO TABS: 0.2500 mg | ORAL_TABLET | Freq: Three times a day (TID) | ORAL | Status: DC

## 2017-05-25 MED ORDER — METOPROLOL TARTRATE 25 MG PO TABS
12.5000 mg | ORAL_TABLET | Freq: Two times a day (BID) | ORAL | Status: DC
  Filled 2017-05-25: qty 1

## 2017-05-25 MED ORDER — FLUOXETINE HCL 20 MG PO CAPS
40.0000 mg | ORAL_CAPSULE | Freq: Every day | ORAL | Status: DC
  Administered 2017-05-26 – 2017-05-27 (×2): 40 mg via ORAL
  Filled 2017-05-25 (×2): qty 2

## 2017-05-25 MED ORDER — HEPARIN SODIUM (PORCINE) 5000 UNIT/ML IJ SOLN
5000.0000 [IU] | Freq: Three times a day (TID) | INTRAMUSCULAR | Status: DC
  Administered 2017-05-25 – 2017-05-27 (×6): 5000 [IU] via SUBCUTANEOUS
  Filled 2017-05-25 (×6): qty 1

## 2017-05-25 MED ORDER — ACETAMINOPHEN 325 MG PO TABS: 650.0000 mg | ORAL_TABLET | Freq: Four times a day (QID) | ORAL | Status: DC | PRN

## 2017-05-25 MED ORDER — SODIUM CHLORIDE 0.9 % IV SOLN
Freq: Once | INTRAVENOUS | Status: AC
  Administered 2017-05-25: 13:00:00 via INTRAVENOUS

## 2017-05-25 MED ORDER — ZOLPIDEM TARTRATE 5 MG PO TABS
5.0000 mg | ORAL_TABLET | Freq: Every day | ORAL | Status: DC
Start: 2017-05-25 — End: 2017-05-27
  Administered 2017-05-25 – 2017-05-26 (×2): 5 mg via ORAL
  Filled 2017-05-25 (×2): qty 1

## 2017-05-25 MED ORDER — MONTELUKAST SODIUM 10 MG PO TABS
10.0000 mg | ORAL_TABLET | Freq: Every day | ORAL | Status: DC
  Administered 2017-05-25 – 2017-05-26 (×2): 10 mg via ORAL
  Filled 2017-05-25 (×2): qty 1

## 2017-05-25 MED ORDER — LISINOPRIL 5 MG PO TABS
5.0000 mg | ORAL_TABLET | Freq: Every day | ORAL | Status: DC
  Administered 2017-05-26 – 2017-05-27 (×2): 5 mg via ORAL
  Filled 2017-05-25 (×2): qty 1

## 2017-05-25 MED ORDER — PRAMIPEXOLE DIHYDROCHLORIDE 0.25 MG PO TABS
0.5000 mg | ORAL_TABLET | Freq: Two times a day (BID) | ORAL | Status: DC
  Administered 2017-05-25 – 2017-05-27 (×4): 0.5 mg via ORAL
  Filled 2017-05-25 (×5): qty 2

## 2017-05-25 MED ORDER — PANTOPRAZOLE SODIUM 40 MG PO TBEC: 40.0000 mg | DELAYED_RELEASE_TABLET | Freq: Every day | ORAL | Status: DC

## 2017-05-25 MED ORDER — ACETAMINOPHEN 650 MG RE SUPP
650.0000 mg | Freq: Four times a day (QID) | RECTAL | Status: DC | PRN
Start: 2017-05-25 — End: 2017-05-27

## 2017-05-25 NOTE — ED Triage Notes (Signed)
Pt to ED via POV, pt states that she has had 7 episodes of syncope since last Saturday. Pt states that after she passes out she is very weak and her right hand draws up. Pt has seen her PCP twice since this has happened but he has been unable to fine any reason for the syncope. Pt states that she has had an echo and a CT scan. Pt in NAD at this time.

## 2017-05-25 NOTE — H&P (Signed)
[facility] NAME: Tonya Walton    MR#:  [id number]  DATE OF BIRTH:  20-Jan-1951  DATE OF ADMISSION:  05/25/2017  PRIMARY CARE PHYSICIAN: Rusty Aus, MD   REQUESTING/REFERRING PHYSICIAN:   CHIEF COMPLAINT:   Chief Complaint  Patient presents with  . Loss of Consciousness    HISTORY OF PRESENT ILLNESS: Tonya Walton  is a 67 y.o. female with a known history of arthritis, atrial fibrillation paroxysmal, congestive heart failure, COPD, coronary artery disease, hypertension, hypothyroidism presented to the emergency room for passing out.  Patient says she passed out twice yesterday.  In the last 2 weeks patient passed out 8 times.  She was worked up with CT head and echocardiogram by primary care physician as an outpatient.  CT head showed no acute abnormality.  Today the EKG is normal sinus rhythm with no ST segment changes.  First set of troponins are negative.  No no history of any witnessed seizures patient's creatinine is elevated and recently she has been on diuresis with torsemide.  Lasix has been stopped torsemide has been started by primary care physician.  Patient appears dry and dehydrated.  PAST MEDICAL HISTORY:   Past Medical History:  Diagnosis Date  . Anxiety   . Arthritis   . Atrial fibrillation, controlled ([place])   . Breast cancer ([place]) 2012   RT LUMPECTOMY  . CHF (congestive heart failure) ([place])   . COPD (chronic obstructive pulmonary disease) ([place])   . Coronary artery disease   . Depression   . Dysrhythmia    Afib  . GERD (gastroesophageal reflux disease)   . Headache   . Hypertension   . Hypothyroidism   . Neuromuscular disorder ([place])    nerve damage in legs  . Personal history of radiation therapy 2012   BREAST CA  . Restless leg syndrome   . Shortness of breath dyspnea     PAST SURGICAL HISTORY:  Past Surgical History:  Procedure Laterality Date  . APPENDECTOMY    . BREAST BIOPSY Left  07/2013   NEG  . BREAST BIOPSY Right 08/22/2016   US guided breast biopsy  . BREAST EXCISIONAL BIOPSY Right 2012   POS  . BREAST LUMPECTOMY     Right breast  . CARDIAC CATHETERIZATION N/A 03/02/2015   Procedure: Left Heart Cath and Coronary Angiography;  Surgeon: Teodoro Spray, MD;  Location: [facility];  Service: Cardiovascular;  Laterality: N/A;  . CATARACT EXTRACTION W/PHACO Right 10/14/2014   Procedure: CATARACT EXTRACTION PHACO AND INTRAOCULAR LENS PLACEMENT (IOC);  Surgeon: Leandrew Koyanagi, MD;  Location: [facility];  Service: Ophthalmology;  Laterality: Right;  . CORONARY ARTERY BYPASS GRAFT N/A 04/01/2015   Procedure: CORONARY ARTERY BYPASS GRAFTING (CABG) x one , using left mammary artery;  Surgeon: Ivin Poot, MD;  Location: [facility];  Service: Open Heart Surgery;  Laterality: N/A;  . JOINT REPLACEMENT Right    Total Knee Replacement  . TEE WITHOUT CARDIOVERSION N/A 04/01/2015   Procedure: TRANSESOPHAGEAL ECHOCARDIOGRAM (TEE);  Surgeon: Ivin Poot, MD;  Location: [facility];  Service: Open Heart Surgery;  Laterality: N/A;  . TONSILLECTOMY    . TOTAL KNEE ARTHROPLASTY Right 06/10/2014   Procedure: TOTAL KNEE ARTHROPLASTY;  Surgeon: Dereck Leep, MD;  Location: [facility] ORS;  Service: Orthopedics;  Laterality: Right;  . TOTAL KNEE ARTHROPLASTY Left 09/02/2014   Procedure: TOTAL KNEE ARTHROPLASTY;  Surgeon: Dereck Leep, MD;  [facility];  Service: Orthopedics;  Laterality: Left;    SOCIAL HISTORY:  Social History   Tobacco Use  . Smoking status: Former Smoker    Packs/day: 1.00    Types: Cigarettes    Last attempt to quit: 04/30/1992    Years since quitting: 25.0  . Smokeless tobacco: Never Used  Substance Use Topics  . Alcohol use: No    FAMILY HISTORY:  Family History  Problem Relation Age of Onset  . Leukemia Mother   . Bone cancer Father   . Heart disease Brother   . Heart disease Brother   . Diabetes Brother   . Hypertension Brother    . Diabetes Brother   . Hypertension Brother   . Heart disease Brother   . Breast cancer Paternal Aunt     DRUG ALLERGIES: No Known Allergies  REVIEW OF SYSTEMS:   CONSTITUTIONAL: No fever, has fatigue and weakness.  EYES: No blurred or double vision.  EARS, NOSE, AND THROAT: No tinnitus or ear pain.  RESPIRATORY: No cough, shortness of breath, wheezing or hemoptysis.  CARDIOVASCULAR: No chest pain, orthopnea, edema.  GASTROINTESTINAL: No nausea, vomiting, diarrhea or abdominal pain.  GENITOURINARY: No dysuria, hematuria.  ENDOCRINE: No polyuria, nocturia,  HEMATOLOGY: No anemia, easy bruising or bleeding SKIN: No rash or lesion. MUSCULOSKELETAL: No joint pain or arthritis.   NEUROLOGIC: No tingling, numbness, weakness.  PSYCHIATRY: No anxiety or depression.   MEDICATIONS AT HOME:  Prior to Admission medications   Medication Sig Start Date End Date Taking? Authorizing Provider  Artificial Tear Ointment (DRY EYES OP) Place 1 drop into both eyes at bedtime.    [provider]  aspirin EC 81 MG tablet Take 81 mg by mouth. 07/23/08   [provider]  atorvastatin (LIPITOR) 40 MG tablet Take 1 tablet (40 mg total) by mouth daily at 6 PM. 04/06/15   Barrett, Erin R, PA-C  Cholecalciferol (VITAMIN D3) 2000 units capsule Take by mouth.    [provider]  clonazePAM (KLONOPIN) 0.5 MG tablet Take 0.5 tablets (0.25 mg total) by mouth at bedtime. 03/26/17   Rainey Pines, MD  Cyanocobalamin (B-12 COMPLIANCE INJECTION) 1000 MCG/ML KIT Inject 1,000 mcg as directed every Monday, Wednesday, and Friday.     [provider]  cyclobenzaprine (FLEXERIL) 10 MG tablet Take 10 mg by mouth daily.     [provider]  esomeprazole (NEXIUM) 40 MG capsule Take by mouth. 11/06/13   [provider]  ferrous [id number] C-folic acid (TRINSICON / FOLTRIN) capsule Take 1 capsule by mouth 3 (three) times daily after meals. 04/06/15   Barrett, Erin R, PA-C   FLUoxetine (PROZAC) 40 MG capsule Take 1 capsule (40 mg total) by mouth daily. 03/26/17   Rainey Pines, MD  furosemide (LASIX) 40 MG tablet Take 1 tablet (40 mg total) by mouth daily as needed for fluid. 03/15/16   Rainey Pines, MD  gabapentin (NEURONTIN) 300 MG capsule Take 1,500 mg by mouth 2 (two) times daily. 1500 mg AM, 1500 mg PM    [provider]  lamoTRIgine (LAMICTAL) 25 MG tablet Take 2 tablets (50 mg total) by mouth daily. 03/26/17   Rainey Pines, MD  levothyroxine (SYNTHROID, LEVOTHROID) 25 MCG tablet Take 25 mcg by mouth daily.     [provider]  lisinopril (PRINIVIL,ZESTRIL) 5 MG tablet Take 1 tablet (5 mg total) by mouth daily. 04/06/15   Barrett, Erin R, PA-C  loratadine (CLARITIN) 10 MG tablet Take by mouth.    [provider]  meloxicam (MOBIC) 15 MG tablet  07/15/15   [provider]  metoprolol tartrate (LOPRESSOR) 25 MG tablet Take 0.5 tablets (12.5 mg total) by mouth 2 (two) times daily. 04/06/15   Barrett, Erin R, PA-C  montelukast (SINGULAIR) 10 MG tablet Take 10 mg by mouth at bedtime.    [provider]  oxybutynin (DITROPAN) 5 MG tablet Take 5 mg by mouth 3 (three) times daily as needed for bladder spasms.    [provider]  pantoprazole (PROTONIX) 40 MG tablet Take 40 mg by mouth daily. AM    [provider]  potassium chloride SA (K-DUR,KLOR-CON) 20 MEQ tablet Take 1 tablet (20 mEq total) by mouth daily. If taking Lasix 04/06/15   Barrett, Erin R, PA-C  pramipexole (MIRAPEX) 0.25 MG tablet Take 0.25-0.5 mg by mouth 3 (three) times daily. Pt takes two tablets in the morning, one tablet at noon, and two tablets at bedtime.    [provider]  zolpidem (AMBIEN) 5 MG tablet TAKE 1 TABLET BY MOUTH AT BEDTIME 02/02/17   [provider]      PHYSICAL EXAMINATION:   VITAL SIGNS: Blood pressure (!) 128/56, pulse 69, temperature 97.9 F (36.6 C), temperature source Oral, resp. rate 18, height 5' 5.5"  (1.664 m), weight 96.2 kg (212 lb), SpO2 97 %.  GENERAL:  67 y.o.-year-old patient lying in the bed with no acute distress.  EYES: Pupils equal, round, reactive to light and accommodation. No scleral icterus. Extraocular muscles intact.  HEENT: Head atraumatic, normocephalic. Oropharynx dry and nasopharynx clear.  Bruise over right upper eye lid NECK:  Supple, no jugular venous distention. No thyroid enlargement, no tenderness.  LUNGS: Normal breath sounds bilaterally, no wheezing, rales,rhonchi or crepitation. No use of accessory muscles of respiration.  CARDIOVASCULAR: S1, S2 normal. No murmurs, rubs, or gallops.  ABDOMEN: Soft, nontender, nondistended. Bowel sounds present. No organomegaly or mass.  EXTREMITIES: No pedal edema, cyanosis, or clubbing.  NEUROLOGIC: Cranial nerves II through XII are intact. Muscle strength 5/5 in all extremities. Sensation intact. Gait not checked.  PSYCHIATRIC: The patient is alert and oriented x 3.  SKIN: No obvious rash, lesion, or ulcer.   LABORATORY PANEL:   CBC Recent Labs  Lab 05/25/17 1121  WBC 9.1  HGB 11.7*  HCT 34.9*  PLT 252  MCV 87.6  MCH 29.3  MCHC 33.4  RDW 14.6*   ------------------------------------------------------------------------------------------------------------------  Chemistries  Recent Labs  Lab 05/25/17 1121  NA 137  K 4.8  CL 100*  CO2 26  GLUCOSE 107*  BUN 63*  CREATININE 2.12*  CALCIUM 9.1   ------------------------------------------------------------------------------------------------------------------ estimated creatinine clearance is 29.4 mL/min (A) (by C-G formula based on SCr of 2.12 mg/dL (H)). ------------------------------------------------------------------------------------------------------------------ No results for input(s): TSH, T4TOTAL, T3FREE, THYROIDAB in the last 72 hours.  Invalid input(s): FREET3   Coagulation profile No results for input(s): INR, PROTIME in the last 168  hours. ------------------------------------------------------------------------------------------------------------------- No results for input(s): DDIMER in the last 72 hours. -------------------------------------------------------------------------------------------------------------------  Cardiac Enzymes Recent Labs  Lab 05/25/17 1121  TROPONINI <0.03   ------------------------------------------------------------------------------------------------------------------ Invalid input(s): POCBNP  ---------------------------------------------------------------------------------------------------------------  Urinalysis    Component Value Date/Time   COLORURINE YELLOW 03/30/2015 [place] 03/30/2015 1555   APPEARANCEUR Clear 05/27/2014 0827   LABSPEC 1.024 03/30/2015 1555   LABSPEC 1.024 05/27/2014 0827   PHURINE 5.5 03/30/2015 1555   GLUCOSEU NEGATIVE 03/30/2015 1555   GLUCOSEU Negative 05/27/2014 0827   HGBUR NEGATIVE 03/30/2015 1555   BILIRUBINUR NEGATIVE  03/30/2015 1555   BILIRUBINUR Negative 05/27/2014 0827   KETONESUR NEGATIVE 03/30/2015 [place] 03/30/2015 1555   NITRITE NEGATIVE 03/30/2015 1555   LEUKOCYTESUR NEGATIVE 03/30/2015 1555   LEUKOCYTESUR Negative 05/27/2014 0827     RADIOLOGY: No results found.  EKG: Orders placed or performed during the hospital encounter of 05/25/17  . EKG 12-Lead  . EKG 12-Lead  . ED EKG  . ED EKG    IMPRESSION AND PLAN:  67 year old female patient with history of proximal atrial fibrillation, hypertension, congestive heart failure, coronary artery disease, hypothyroidism, GERD, emphysema, breast cancer presented to the emergency room for passing out.  Recurrent syncope Admit patient to telemetry inpatient service Monitor for any erythremia Cardiology consultation Obtain echocardiogram report Cycle troponin to rule ischemia  Dehydration from excessive diuresis IV hydration with normal  saline Hold diuretics  Acute on chronic renal failure IV fluids and monitor renal function  DVT prophylaxis subcu heparin  All the records are reviewed and case discussed with ED provider. Management plans discussed with the patient, family and they are in agreement.  CODE STATUS:Full code Code Status History    Date Active Date Inactive Code Status Order ID Comments User Context   09/02/2014 1628 09/05/2014 1422 Full Code [id number]  Dereck Leep, MD Inpatient   06/10/2014 1650 06/13/2014 1605 Full Code [id number]  Dereck Leep, MD Inpatient       TOTAL TIME TAKING CARE OF THIS PATIENT: 51 minutes.    Saundra Shelling M.D on 05/25/2017 at 1:03 PM  Between 7am to 6pm - Pager - [phone]  After 6pm go to [email or web address] - password [email or web address] [facility]  Office  [phone]  CC: Primary care physician; Rusty Aus, MD

## 2017-05-25 NOTE — ED Provider Notes (Signed)
[facility] Emergency Department Provider Note       Time seen: ----------------------------------------- 12:04 PM on 05/25/2017 -----------------------------------------   I have reviewed the triage vital signs and the nursing notes.  HISTORY   Chief Complaint Loss of Consciousness    HPI Tonya Walton is a 67 y.o. female with a history of arthritis, A. fib, CHF, COPD, GERD, hypertension, breast cancer who presents to the ED for syncopal events.  Patient states she had about 7 episodes of syncope since last Saturday.  Patient states after she passes out she feels very weak.  She has seen her primary care doctor twice since this happened but he has been unable to find any reason for the syncope.  Patient states she has had an echo and a CT scan.  Patient reports she has been seen by Dr. for this twice, is currently only taking torsemide once a day.  Past Medical History:  Diagnosis Date  . Anxiety   . Arthritis   . Atrial fibrillation, controlled ([place])   . Breast cancer ([place]) 2012   RT LUMPECTOMY  . CHF (congestive heart failure) ([place])   . COPD (chronic obstructive pulmonary disease) ([place])   . Coronary artery disease   . Depression   . Dysrhythmia    Afib  . GERD (gastroesophageal reflux disease)   . Headache   . Hypertension   . Hypothyroidism   . Neuromuscular disorder ([place])    nerve damage in legs  . Personal history of radiation therapy 2012   BREAST CA  . Restless leg syndrome   . Shortness of breath dyspnea     Patient Active Problem List   Diagnosis Date Noted  . Medicare annual wellness visit, initial 02/24/2016  . Chest pain 04/01/2015  . CAD in native artery 03/10/2015  . Unstable angina pectoris ([place]) 02/23/2015  . Benign essential HTN 10/20/2014  . Chronic anxiety 10/20/2014  . Anxiety, generalized 10/20/2014  . Insomnia, persistent 10/20/2014  . Recurrent major depressive episodes ([place]) 10/20/2014  . Depression, major,  single episode, severe ([place]) 10/20/2014  . Combined fat and carbohydrate induced hyperlipemia 10/20/2014  . Restless leg syndrome 10/20/2014  . MDD (major depressive disorder), recurrent episode, moderate ([place]) 10/20/2014  . AF (paroxysmal atrial fibrillation) ([place]) 07/31/2014  . Paroxysmal atrial fibrillation ([place]) 07/31/2014  . H/O total knee replacement 06/25/2014  . Total knee replacement status 06/10/2014  . H/O malignant neoplasm of breast 05/24/2014  . Restless leg 05/24/2014  . H/O: HTN (hypertension) 05/24/2014  . History of atrial fibrillation 05/24/2014  . CAFL (chronic airflow limitation) ([place]) 05/24/2014  . Vitamin B12 deficiency 05/24/2014  . H/O disease 05/24/2014  . H/O: hypothyroidism 05/24/2014  . H/O gastrointestinal disease 05/24/2014  . [place] anemia 02/16/2014  . Anxiety 11/06/2013  . A-fib ([place]) 11/06/2013  . Clinical depression 11/06/2013  . Acid reflux 11/06/2013  . Heart valve disease 11/06/2013  . Primary cancer of right female breast ([place]) 11/06/2013  . HLD (hyperlipidemia) 11/06/2013  . BP (high blood pressure) 11/06/2013  . Atrial fibrillation ([place]) 11/06/2013  . Chronic obstructive pulmonary disease ([place]) 11/06/2013  . Hormone receptor positive malignant neoplasm of breast ([place]) 11/06/2013  . Adult hypothyroidism 06/29/2010  . Adult BMI 30+ 06/29/2010  . B-complex deficiency 12/05/1995  . Extremity pain 12/05/1995    Past Surgical History:  Procedure Laterality Date  . APPENDECTOMY    . BREAST BIOPSY Left 07/2013   NEG  . BREAST BIOPSY Right 08/22/2016   US guided  breast biopsy  . BREAST EXCISIONAL BIOPSY Right 2012   POS  . BREAST LUMPECTOMY     Right breast  . CARDIAC CATHETERIZATION N/A 03/02/2015   Procedure: Left Heart Cath and Coronary Angiography;  Surgeon: Teodoro Spray, MD;  Location: [place] [facility];  Service: Cardiovascular;  Laterality: N/A;  . CATARACT EXTRACTION W/PHACO Right 10/14/2014   Procedure: CATARACT EXTRACTION  PHACO AND INTRAOCULAR LENS PLACEMENT (IOC);  Surgeon: Leandrew Koyanagi, MD;  Location: [facility];  Service: Ophthalmology;  Laterality: Right;  . CORONARY ARTERY BYPASS GRAFT N/A 04/01/2015   Procedure: CORONARY ARTERY BYPASS GRAFTING (CABG) x one , using left mammary artery;  Surgeon: Ivin Poot, MD;  Location: [place];  Service: Open Heart Surgery;  Laterality: N/A;  . JOINT REPLACEMENT Right    Total Knee Replacement  . TEE WITHOUT CARDIOVERSION N/A 04/01/2015   Procedure: TRANSESOPHAGEAL ECHOCARDIOGRAM (TEE);  Surgeon: Ivin Poot, MD;  Location: [facility];  Service: Open Heart Surgery;  Laterality: N/A;  . TONSILLECTOMY    . TOTAL KNEE ARTHROPLASTY Right 06/10/2014   Procedure: TOTAL KNEE ARTHROPLASTY;  Surgeon: Dereck Leep, MD;  Location: [facility] ORS;  Service: Orthopedics;  Laterality: Right;  . TOTAL KNEE ARTHROPLASTY Left 09/02/2014   Procedure: TOTAL KNEE ARTHROPLASTY;  Surgeon: Dereck Leep, MD;  Location: [facility] ORS;  Service: Orthopedics;  Laterality: Left;    Allergies Patient has no known allergies.  Social History Social History   Tobacco Use  . Smoking status: Former Smoker    Packs/day: 1.00    Types: Cigarettes    Last attempt to quit: 04/30/1992    Years since quitting: 25.0  . Smokeless tobacco: Never Used  Substance Use Topics  . Alcohol use: No  . Drug use: No   Review of Systems Constitutional: Negative for fever. Cardiovascular: Negative for chest pain. Respiratory: Negative for shortness of breath. Gastrointestinal: Negative for abdominal pain, vomiting and diarrhea. Musculoskeletal: Positive for back pain Skin: Negative for rash. Neurological: Positive for generalized weakness  All systems negative/normal/unremarkable except as stated in the HPI  ____________________________________________   PHYSICAL EXAM:  VITAL SIGNS: ED Triage Vitals  Enc Vitals Group     BP 05/25/17 1112 (!) 123/39     Pulse Rate 05/25/17 1112 69     Resp  05/25/17 1112 18     Temp 05/25/17 1112 97.9 F (36.6 C)     Temp Source 05/25/17 1112 Oral     SpO2 05/25/17 1112 97 %     Weight 05/25/17 1112 212 lb (96.2 kg)     Height 05/25/17 1112 5' 5.5" (1.664 m)     Head Circumference --      Peak Flow --      Pain Score 05/25/17 1119 0     Pain Loc --      Pain Edu? --      Excl. in [place]? --    Constitutional: Alert and oriented. Well appearing and in no distress. Eyes: Conjunctivae are normal. Normal extraocular movements. ENT   Head: Normocephalic and atraumatic.   Nose: No congestion/rhinnorhea.   Mouth/Throat: Mucous membranes are moist.   Neck: No stridor. Cardiovascular: Normal rate, regular rhythm. No murmurs, rubs, or gallops. Respiratory: Normal respiratory effort without tachypnea nor retractions. Breath sounds are clear and equal bilaterally. No wheezes/rales/rhonchi. Gastrointestinal: Soft and nontender. Normal bowel sounds Musculoskeletal: Nontender with normal range of motion in extremities. No lower extremity tenderness nor edema. Neurologic:  Normal speech and language. No  gross focal neurologic deficits are appreciated.  Skin:  Skin is warm, dry and intact. No rash noted. Psychiatric: Mood and affect are normal. Speech and behavior are normal.  ____________________________________________  EKG: Interpreted by me.  Sinus rhythm rate 66 bpm, normal PR interval, normal QRS, normal QT.  ____________________________________________  ED COURSE:  As part of my medical decision making, I reviewed the following data within the [place] History obtained from family if available, nursing notes, old chart and ekg, as well as notes from prior ED visits. Patient presented for recurrent syncope, we will assess with labs  as indicated at this time.   Procedures ____________________________________________   LABS (pertinent positives/negatives)  Labs Reviewed  BASIC METABOLIC PANEL - Abnormal;  Notable for the following components:      Result Value   Chloride 100 (*)    Glucose, Bld 107 (*)    BUN 63 (*)    Creatinine, Ser 2.12 (*)    GFR calc non Af Amer 23 (*)    GFR calc Af Amer 26 (*)    All other components within normal limits  CBC - Abnormal; Notable for the following components:   Hemoglobin 11.7 (*)    HCT 34.9 (*)    RDW 14.6 (*)    All other components within normal limits  TROPONIN I  URINALYSIS, COMPLETE (UACMP) WITH MICROSCOPIC  CBG MONITORING, ED   ____________________________________________  DIFFERENTIAL DIAGNOSIS   Dehydration, electrolyte abnormality, arrhythmia, MI  FINAL ASSESSMENT AND PLAN  Syncope, acute renal failure   Plan: The patient had presented for recurrent episodes of syncope. Patient's labs do indicate acute renal failure.  I have started her on IV fluids, currently she states she is making urine normally.  I will discuss with the hospitalist for admission.  I will also order a renal ultrasound.   Laurence Aly, MD   Note: This note was generated in part or whole with voice recognition software. Voice recognition is usually quite accurate but there are transcription errors that can and very often do occur. I apologize for any typographical errors that were not detected and corrected.     Earleen Newport, MD 05/25/17 [phone]

## 2017-05-25 NOTE — ED Notes (Signed)
Transporting pt to room 257 at this time.

## 2017-05-25 NOTE — Progress Notes (Signed)
Advanced care plan.  Purpose of the Encounter: CODE STATUS  Parties in Attendance: Patient  Patient's Decision Capacity: Good  Subjective/Patient's story: Presented for passing out   Objective/Medical story Getting admitted for syncope and dehydration   Goals of care determination:  Advanced directives discussed with patient For now patient wants everything done which includes cardiac resuscitation, intubation and ventilator if the need arises   CODE STATUS: Full code  Time spent discussing advanced care planning: 16 minutes

## 2017-05-25 NOTE — Progress Notes (Signed)
Patient admitted to unit. Oriented to room, call bell, and staff. Bed in lowest position. Fall safety plan reviewed. Full assessment to Epic. Skin assessment verified with Gildardo Pounds RN. Telemetry box verification with tele clerk- Box#: [id number]---. Will continue to monitor.

## 2017-05-26 LAB — BASIC METABOLIC PANEL
Anion gap: 6 (ref 5–15)
BUN: 39 mg/dL — ABNORMAL HIGH (ref 6–20)
CALCIUM: 8.6 mg/dL — AB (ref 8.9–10.3)
CHLORIDE: 105 mmol/L (ref 101–111)
CO2: 27 mmol/L (ref 22–32)
CREATININE: 1.05 mg/dL — AB (ref 0.44–1.00)
GFR calc non Af Amer: 53 mL/min — ABNORMAL LOW (ref >60)
Glucose, Bld: 146 mg/dL — ABNORMAL HIGH (ref 65–99)
Potassium: 4.1 mmol/L (ref 3.5–5.1)
SODIUM: 138 mmol/L (ref 135–145)

## 2017-05-26 LAB — TROPONIN I: Troponin I: <0.03 ng/mL (ref <0.03)

## 2017-05-26 NOTE — Plan of Care (Signed)
  Problem: Clinical Measurements: ?Goal: Will remain free from infection ?Outcome: Progressing ?  ?Problem: Activity: ?Goal: Risk for activity intolerance will decrease ?Outcome: Progressing ?  ?Problem: Coping: ?Goal: Level of anxiety will decrease ?Outcome: Progressing ?  ?Problem: Pain Managment: ?Goal: General experience of comfort will improve ?Outcome: Progressing ?  ?Problem: Safety: ?Goal: Ability to remain free from injury will improve ?Outcome: Progressing ?  ?

## 2017-05-26 NOTE — Plan of Care (Signed)
Bedrest with external catheter since arrival to unit.  Pt able to stand for daily weight.  OOB  with assistance encouraged.

## 2017-05-26 NOTE — Progress Notes (Signed)
[facility] [place] NAME: Tonya Walton    MR#:  [id number]  DATE OF BIRTH:  24-May-1950  SUBJECTIVE:  patient came in with several syncopal episodes at home more so with the last few days. She has a bruise over her right eye. Denies any chest pain shortness of breath nausea vomiting or diarrhea. She was found to be significantly dehydrated on admission. She is eating and drinking well.  REVIEW OF SYSTEMS:   Review of Systems  Constitutional: Negative for chills, fever and weight loss.  HENT: Negative for ear discharge, ear pain and nosebleeds.   Eyes: Negative for blurred vision, pain and discharge.  Respiratory: Negative for sputum production, shortness of breath, wheezing and stridor.   Cardiovascular: Negative for chest pain, palpitations, orthopnea and PND.  Gastrointestinal: Negative for abdominal pain, diarrhea, nausea and vomiting.  Genitourinary: Negative for frequency and urgency.  Musculoskeletal: Negative for back pain and joint pain.  Neurological: Negative for sensory change, speech change, focal weakness and weakness.  Psychiatric/Behavioral: Negative for depression and hallucinations. The patient is not nervous/anxious.    Tolerating Diet: yes Tolerating PT: pending  DRUG ALLERGIES:  No Known Allergies  VITALS:  Blood pressure (!) 130/55, pulse 65, temperature 98.2 F (36.8 C), temperature source Oral, resp. rate 18, height 5\' 5"  (1.651 m), weight 96.8 kg (213 lb 4.8 oz), SpO2 98 %.  PHYSICAL EXAMINATION:   Physical Exam  GENERAL:  67 y.o.-year-old patient lying in the bed with no acute distress.  EYES: Pupils equal, round, reactive to light and accommodation. No scleral icterus. Extraocular muscles intact. Bruise over the right eye HEENT: Head atraumatic, normocephalic. Oropharynx and nasopharynx clear.  NECK:  Supple, no jugular venous distention. No thyroid enlargement, no tenderness.  LUNGS: Normal breath  sounds bilaterally, no wheezing, rales, rhonchi. No use of accessory muscles of respiration.  CARDIOVASCULAR: S1, S2 normal. No murmurs, rubs, or gallops.  ABDOMEN: Soft, nontender, nondistended. Bowel sounds present. No organomegaly or mass.  EXTREMITIES: No cyanosis, clubbing or edema b/l.    NEUROLOGIC: Cranial nerves II through XII are intact. No focal Motor or sensory deficits b/l.   PSYCHIATRIC:  patient is alert and oriented x 3.  SKIN: No obvious rash, lesion, or ulcer.   LABORATORY PANEL:  CBC Recent Labs  Lab 05/25/17 1448  WBC 8.0  HGB 11.8*  HCT 35.2  PLT 242    Chemistries  Recent Labs  Lab 05/26/17 0116  NA 138  K 4.1  CL 105  CO2 27  GLUCOSE 146*  BUN 39*  CREATININE 1.05*  CALCIUM 8.6*   Cardiac Enzymes Recent Labs  Lab 05/26/17 0116  TROPONINI <0.03   RADIOLOGY:  US Renal  Result Date: 05/25/2017 CLINICAL DATA:  Back pain with renal failure EXAM: RENAL / URINARY TRACT ULTRASOUND COMPLETE COMPARISON:  None. FINDINGS: Right Kidney: Length: 10.2 cm. Echogenicity within normal limits. No mass or hydronephrosis visualized. Left Kidney: Length: 9.6 cm. Echogenicity within normal limits. No mass or hydronephrosis visualized. Bladder: Appears normal for degree of bladder distention. IMPRESSION: Negative renal ultrasound Electronically Signed   By: Donavan Foil M.D.   On: 05/25/2017 16:50   US Carotid Bilateral  Result Date: 05/25/2017 CLINICAL DATA:  Syncope. History of hypertension and coronary artery disease. EXAM: BILATERAL CAROTID DUPLEX ULTRASOUND TECHNIQUE: Pearline Cables scale imaging, color Doppler and duplex ultrasound were performed of bilateral carotid and vertebral arteries in the neck. COMPARISON:  None. FINDINGS: Criteria: Quantification of carotid stenosis is  based on velocity parameters that correlate the residual internal carotid diameter with NASCET-based stenosis levels, using the diameter of the distal internal carotid lumen as the denominator for  stenosis measurement. The following velocity measurements were obtained: RIGHT ICA:  143/35 distal, 70/16 proximal cm/sec CCA:  034/74 cm/sec SYSTOLIC ICA/CCA RATIO:  1.3 ECA:  115 cm/sec LEFT ICA:  126/31 distal, 57/15 proximal cm/sec CCA:  25/95 cm/sec SYSTOLIC ICA/CCA RATIO:  1.6 ECA:  116 cm/sec RIGHT CAROTID ARTERY: Mild amount of partially calcified plaque at the level of the carotid bulb and proximal right ICA. Estimated right ICA stenosis is less than 50%. RIGHT VERTEBRAL ARTERY: Antegrade flow with normal waveform and velocity. LEFT CAROTID ARTERY: Minimal calcified plaque is present at the level of the distal bulb. No evidence of left ICA plaque or stenosis. LEFT VERTEBRAL ARTERY: Antegrade flow with normal waveform and velocity. IMPRESSION: 1. Mild amount of plaque at the level of the right carotid bulb and proximal right ICA. Estimated right ICA stenosis is less than 50%. 2. No evidence of left ICA plaque or stenosis. Electronically Signed   By: Aletta Edouard M.D.   On: 05/25/2017 16:55   ASSESSMENT AND PLAN:   Tonya Walton  is a 67 y.o. female with a known history of arthritis, atrial fibrillation paroxysmal, congestive heart failure, COPD, coronary artery disease, hypertension, hypothyroidism presented to the emergency room for passing out.  Patient says she passed out twice yesterday.  In the last 2 weeks patient passed out 8 times.  She was worked up with CT head and echocardiogram by primary care physician as an outpatient  *Recurrent syncope -patient presented with syncopal episodes at home. She has had several in the past few days. She was seen as outpatient by primary care physician. She was placed on Holter monitor which according to the patient did not show any abnormal arrhythmia. Her MRI of the brain is negative. Carotid ultrasound shows less than 50% plaque. -Her medications were adjusted where her metoprolol was decreased and torsemide dose was decreased by primary care  physician. -Patient was found to be significantly dehydrated at admission with creatinine of 2.12. Her baseline creatinine is .8. -She states she drinks a lot of water. -Received IV fluids creatinine down to 1.08. Patient feels a lot better. No neural deficits. No evidence of seizures. -Consider neurology consultation if needed. -Patient had cardiac workup done by Dr. Emily Filbert. I will hold off on cardiology consultation. Patient agrees to it.   *Dehydration from excessive diuresis IV hydration with normal saline Hold diuretics  *history of hypertension blood pressure stable. -She had some evidence of bradycardia. I will discontinue her mature metoprolol and lisinopril since blood pressure is stable and patient feels really good at present. -If blood pressure continues to rise will have patient resume lisinopril.  *DVT prophylaxis subcu heparin     Case discussed with Care Management/Social Worker. Management plans discussed with the patient, family and they are in agreement.  CODE STATUS: full  DVT Prophylaxis: Lovenox  TOTAL TIME TAKING CARE OF THIS PATIENT: 30 minutes.  >50% time spent on counselling and coordination of care  POSSIBLE D/C IN 2 to 3 DAYS, DEPENDING ON CLINICAL CONDITION.  Note: This dictation was prepared with Dragon dictation along with smaller phrase technology. Any transcriptional errors that result from this process are unintentional.  Fritzi Mandes M.D on 05/26/2017 at 4:18 PM  Between 7am to 6pm - Pager - [phone]  After 6pm go to [email or web address] - [place]  [facility]  Office  [phone]  CC: Primary care physician; Rusty Aus, MDPatient ID: Tonya Walton, female   DOB: [birth year], 67 y.o.   MRN: [id number]

## 2017-05-27 MED ORDER — GABAPENTIN 300 MG PO CAPS
900.0000 mg | ORAL_CAPSULE | Freq: Three times a day (TID) | ORAL | Status: DC
  Administered 2017-05-27: 900 mg via ORAL

## 2017-05-27 NOTE — Progress Notes (Signed)
Discharged to home with husband.  Instructed to make her follow up appointments tomorrow.  We cannot make them on a Sunday

## 2017-05-27 NOTE — Discharge Summary (Signed)
[facility] [place] NAME: Tonya Walton    MR#:  [id number]  DATE OF BIRTH:  [birth year]  DATE OF ADMISSION:  05/25/2017 ADMITTING PHYSICIAN: Saundra Shelling, MD  DATE OF DISCHARGE: 05/27/2017  PRIMARY CARE PHYSICIAN: Rusty Aus, MD    ADMISSION DIAGNOSIS:  Syncope [R55] Acute renal failure, unspecified acute renal failure type ([place]) [N17.9] Syncope, unspecified syncope type [R55]  DISCHARGE DIAGNOSIS:  recurrent syncope acute failure secondary to dehydration  SECONDARY DIAGNOSIS:   Past Medical History:  Diagnosis Date  . Anxiety   . Arthritis   . Atrial fibrillation, controlled ([place])   . Breast cancer ([place]) 2012   RT LUMPECTOMY  . CHF (congestive heart failure) ([place])   . COPD (chronic obstructive pulmonary disease) ([place])   . Coronary artery disease   . Depression   . Dysrhythmia    Afib  . GERD (gastroesophageal reflux disease)   . Headache   . Hypertension   . Hypothyroidism   . Neuromuscular disorder ([place])    nerve damage in legs  . Personal history of radiation therapy 2012   BREAST CA  . Restless leg syndrome   . Shortness of breath dyspnea     HOSPITAL COURSE:   LindaWatkinsis a67 y.o.femalewith a known history of arthritis, atrial fibrillation paroxysmal, congestive heart failure, COPD, coronary artery disease, hypertension, hypothyroidism presented to the emergency room for passing out.Patient says she passed out twice yesterday.In the last 2 weeks patient passed out 8 times.She was worked up with CT head and echocardiogram by primary care physician as an outpatient  *Recurrent syncope -patient presented with syncopal episodes at home. She has had several in the past few days. She was seen as outpatient by primary care physician. She was placed on Holter monitor which according to the patient did not show any abnormal arrhythmia. Her MRI of the brain is negative. Carotid ultrasound shows  less than 50% plaque. -EKG x2 NSR -Her medications were adjusted where her metoprolol  and torsemide dose were d/ced by primary care physician. -Patient was found to be significantly dehydrated at admission with creatinine of 2.12. Her baseline creatinine is 0.8. -She states she drinks a lot of water. -Received IV fluids creatinine down to 1.08. Patient feels a lot better. No neural deficits. No evidence of seizures. -Consider neurology consultation if needed. -Patient had cardiac workup done by Dr. Emily Filbert. I will hold off on cardiology consultation. Patient agrees to it.  *Dehydration from excessive diuresis IV hydration with normal saline Hold diuretics  *history of hypertension blood pressure stable. -She had some evidence of bradycardia. I will discontinue her metoprolol and lisinopril since blood pressure is stable and patient feels really good at present. -If blood pressure continues to rise will have patient resume lisinopril.  *DVT prophylaxis subcu heparin  it is in agreement with double plan. Husband in the room voice understanding. She will follow-up with Dr. Sabra Heck on Tuesday her routine appointment.  DisCharge home. Patient agreeable with plan. Case discussed with Care Management/Social Worker. Management plans discussed with the patient, family and they are in agreement.  CODE STATUS: full  DVT Prophylaxis: Lovenox    CONSULTS OBTAINED:    DRUG ALLERGIES:  No Known Allergies  DISCHARGE MEDICATIONS:   Allergies as of 05/27/2017   No Known Allergies     Medication List    STOP taking these medications   ferrous [id number] C-folic acid capsule Commonly known as:  TRINSICON / FOLTRIN  furosemide 40 MG tablet Commonly known as:  LASIX   lisinopril 5 MG tablet Commonly known as:  PRINIVIL,ZESTRIL   metoprolol tartrate 25 MG tablet Commonly known as:  LOPRESSOR   potassium chloride SA 20 MEQ tablet Commonly known as:   K-DUR,KLOR-CON   torsemide 20 MG tablet Commonly known as:  DEMADEX     TAKE these medications   aspirin EC 81 MG tablet Take 81 mg by mouth.   atorvastatin 40 MG tablet Commonly known as:  LIPITOR Take 1 tablet (40 mg total) by mouth daily at 6 PM.   clonazePAM 0.5 MG tablet Commonly known as:  KLONOPIN Take 0.5 tablets (0.25 mg total) by mouth at bedtime.   cyclobenzaprine 10 MG tablet Commonly known as:  FLEXERIL Take 10 mg by mouth daily as needed for muscle spasms.   DRY EYES OP Place 1 drop into both eyes at bedtime.   esomeprazole 40 MG capsule Commonly known as:  NEXIUM Take 40 mg by mouth daily.   FLUoxetine 40 MG capsule Commonly known as:  PROZAC Take 1 capsule (40 mg total) by mouth daily.   folic acid 1 MG tablet Commonly known as:  FOLVITE Take 1 mg by mouth 2 (two) times a week.   gabapentin 300 MG capsule Commonly known as:  NEURONTIN Take 900 mg by mouth 3 (three) times daily.   lamoTRIgine 25 MG tablet Commonly known as:  LAMICTAL Take 2 tablets (50 mg total) by mouth daily.   levothyroxine 25 MCG tablet Commonly known as:  SYNTHROID, LEVOTHROID Take 25 mcg by mouth daily.   loratadine 10 MG tablet Commonly known as:  CLARITIN Take 10 mg by mouth daily.   meloxicam 15 MG tablet Commonly known as:  MOBIC Take 7.5 mg by mouth 2 (two) times daily.   montelukast 10 MG tablet Commonly known as:  SINGULAIR Take 10 mg by mouth at bedtime.   oxybutynin 5 MG tablet Commonly known as:  DITROPAN Take 5 mg by mouth 3 (three) times daily as needed for bladder spasms.   pramipexole 0.25 MG tablet Commonly known as:  MIRAPEX Take 0.25-0.5 mg by mouth 3 (three) times daily. Pt takes two tablets in the morning, one tablet at noon, and two tablets at bedtime.   vitamin B-12 1000 MCG tablet Commonly known as:  CYANOCOBALAMIN Take 1,000 mcg by mouth daily.   Vitamin D3 2000 units capsule Take 2,000 Units by mouth daily.   zolpidem 5 MG  tablet Commonly known as:  AMBIEN TAKE 1 TABLET BY MOUTH AT BEDTIME       If you experience worsening of your admission symptoms, develop shortness of breath, life threatening emergency, suicidal or homicidal thoughts you must seek medical attention immediately by calling 911 or calling your MD immediately  if symptoms less severe.  You Must read complete instructions/literature along with all the possible adverse reactions/side effects for all the Medicines you take and that have been prescribed to you. Take any new Medicines after you have completely understood and accept all the possible adverse reactions/side effects.   Please note  You were cared for by a hospitalist during your hospital stay. If you have any questions about your discharge medications or the care you received while you were in the hospital after you are discharged, you can call the unit and asked to speak with the hospitalist on call if the hospitalist that took care of you is not available. Once you are discharged, your primary care physician will  handle any further medical issues. Please note that NO REFILLS for any discharge medications will be authorized once you are discharged, as it is imperative that you return to your primary care physician (or establish a relationship with a primary care physician if you do not have one) for your aftercare needs so that they can reassess your need for medications and monitor your lab values. Today   SUBJECTIVE   Doing well  VITAL SIGNS:  Blood pressure 114/72, pulse 72, temperature 98.2 F (36.8 C), temperature source Oral, resp. rate 16, height 5\' 5"  (1.651 m), weight 95.5 kg (210 lb 8 oz), SpO2 96 %.  I/O:    Intake/Output Summary (Last 24 hours) at 05/27/2017 1106 Last data filed at 05/27/2017 1011 Gross per 24 hour  Intake 720 ml  Output 3250 ml  Net -2530 ml    PHYSICAL EXAMINATION:  GENERAL:  67 y.o.-year-old patient lying in the bed with no acute distress.   EYES: Pupils equal, round, reactive to light and accommodation. No scleral icterus. Extraocular muscles intact.  HEENT: Head atraumatic, normocephalic. Oropharynx and nasopharynx clear.  NECK:  Supple, no jugular venous distention. No thyroid enlargement, no tenderness.  LUNGS: Normal breath sounds bilaterally, no wheezing, rales,rhonchi or crepitation. No use of accessory muscles of respiration.  CARDIOVASCULAR: S1, S2 normal. No murmurs, rubs, or gallops.  ABDOMEN: Soft, non-tender, non-distended. Bowel sounds present. No organomegaly or mass.  EXTREMITIES: No pedal edema, cyanosis, or clubbing.  NEUROLOGIC: Cranial nerves II through XII are intact. Muscle strength 5/5 in all extremities. Sensation intact. Gait not checked.  PSYCHIATRIC: The patient is alert and oriented x 3.  SKIN: No obvious rash, lesion, or ulcer.   DATA REVIEW:   CBC  Recent Labs  Lab 05/25/17 1448  WBC 8.0  HGB 11.8*  HCT 35.2  PLT 242    Chemistries  Recent Labs  Lab 05/26/17 0116  NA 138  K 4.1  CL 105  CO2 27  GLUCOSE 146*  BUN 39*  CREATININE 1.05*  CALCIUM 8.6*    Microbiology Results   No results found for this or any previous visit (from the past 240 hour(s)).  RADIOLOGY:  US Renal  Result Date: 05/25/2017 CLINICAL DATA:  Back pain with renal failure EXAM: RENAL / URINARY TRACT ULTRASOUND COMPLETE COMPARISON:  None. FINDINGS: Right Kidney: Length: 10.2 cm. Echogenicity within normal limits. No mass or hydronephrosis visualized. Left Kidney: Length: 9.6 cm. Echogenicity within normal limits. No mass or hydronephrosis visualized. Bladder: Appears normal for degree of bladder distention. IMPRESSION: Negative renal ultrasound Electronically Signed   By: Donavan Foil M.D.   On: 05/25/2017 16:50   US Carotid Bilateral  Result Date: 05/25/2017 CLINICAL DATA:  Syncope. History of hypertension and coronary artery disease. EXAM: BILATERAL CAROTID DUPLEX ULTRASOUND TECHNIQUE: Pearline Cables scale imaging,  color Doppler and duplex ultrasound were performed of bilateral carotid and vertebral arteries in the neck. COMPARISON:  None. FINDINGS: Criteria: Quantification of carotid stenosis is based on velocity parameters that correlate the residual internal carotid diameter with NASCET-based stenosis levels, using the diameter of the distal internal carotid lumen as the denominator for stenosis measurement. The following velocity measurements were obtained: RIGHT ICA:  143/35 distal, 70/16 proximal cm/sec CCA:  540/98 cm/sec SYSTOLIC ICA/CCA RATIO:  1.3 ECA:  115 cm/sec LEFT ICA:  126/31 distal, 57/15 proximal cm/sec CCA:  11/91 cm/sec SYSTOLIC ICA/CCA RATIO:  1.6 ECA:  116 cm/sec RIGHT CAROTID ARTERY: Mild amount of partially calcified plaque at the level of the  carotid bulb and proximal right ICA. Estimated right ICA stenosis is less than 50%. RIGHT VERTEBRAL ARTERY: Antegrade flow with normal waveform and velocity. LEFT CAROTID ARTERY: Minimal calcified plaque is present at the level of the distal bulb. No evidence of left ICA plaque or stenosis. LEFT VERTEBRAL ARTERY: Antegrade flow with normal waveform and velocity. IMPRESSION: 1. Mild amount of plaque at the level of the right carotid bulb and proximal right ICA. Estimated right ICA stenosis is less than 50%. 2. No evidence of left ICA plaque or stenosis. Electronically Signed   By: Aletta Edouard M.D.   On: 05/25/2017 16:55     Management plans discussed with the patient, family and they are in agreement.  CODE STATUS:     Code Status Orders  (From admission, onward)        Start     Ordered   05/25/17 1347  Full code  Continuous     05/25/17 1347    Code Status History    Date Active Date Inactive Code Status Order ID Comments User Context   09/02/2014 1628 09/05/2014 1422 Full Code [id number]  Dereck Leep, MD Inpatient   06/10/2014 1650 06/13/2014 1605 Full Code [id number]  Dereck Leep, MD Inpatient      TOTAL TIME TAKING CARE OF THIS  PATIENT: *40* minutes.    Fritzi Mandes M.D on 05/27/2017 at 11:06 AM  Between 7am to 6pm - Pager - [phone] After 6pm go to [email or web address] - password [email or web address]  Office  [phone]  CC: Primary care physician; Rusty Aus, MD

## 2017-05-29 DIAGNOSIS — R55 Syncope and collapse: Secondary | ICD-10-CM | POA: Diagnosis not present

## 2017-05-29 DIAGNOSIS — D510 Vitamin B12 deficiency anemia due to intrinsic factor deficiency: Secondary | ICD-10-CM | POA: Diagnosis not present

## 2017-05-29 DIAGNOSIS — R638 Other symptoms and signs concerning food and fluid intake: Secondary | ICD-10-CM | POA: Diagnosis not present

## 2017-05-29 DIAGNOSIS — G40909 Epilepsy, unspecified, not intractable, without status epilepticus: Secondary | ICD-10-CM | POA: Diagnosis not present

## 2017-05-30 DIAGNOSIS — G2581 Restless legs syndrome: Secondary | ICD-10-CM | POA: Diagnosis not present

## 2017-05-30 DIAGNOSIS — Z79899 Other long term (current) drug therapy: Secondary | ICD-10-CM | POA: Diagnosis not present

## 2017-05-30 DIAGNOSIS — R55 Syncope and collapse: Secondary | ICD-10-CM | POA: Diagnosis not present

## 2017-05-31 DIAGNOSIS — R55 Syncope and collapse: Secondary | ICD-10-CM | POA: Diagnosis not present

## 2017-06-07 ENCOUNTER — Ambulatory Visit: Payer: PPO | Admitting: Psychiatry

## 2017-06-07 ENCOUNTER — Encounter: Payer: Self-pay | Admitting: Psychiatry

## 2017-06-07 ENCOUNTER — Other Ambulatory Visit: Payer: Self-pay

## 2017-06-07 VITALS — BP 110/59 | HR 77 | Temp 99.0°F | Wt 212.8 lb

## 2017-06-07 DIAGNOSIS — F39 Unspecified mood [affective] disorder: Secondary | ICD-10-CM | POA: Diagnosis not present

## 2017-06-07 MED ORDER — FLUOXETINE HCL 40 MG PO CAPS: 40.0000 mg | ORAL_CAPSULE | Freq: Every day | ORAL | 1 refills | Status: DC

## 2017-06-07 MED ORDER — ZOLPIDEM TARTRATE 5 MG PO TABS
5.0000 mg | ORAL_TABLET | Freq: Every day | ORAL | 1 refills | Status: DC
Start: 2017-06-07 — End: 2017-07-09

## 2017-06-07 MED ORDER — LAMOTRIGINE 25 MG PO TABS: ORAL_TABLET | ORAL | 1 refills | Status: DC

## 2017-06-07 NOTE — Progress Notes (Signed)
BH MD/PA/NP OP Progress Note  06/07/2017 9:00 AM Tonya Walton  MRN:  [id number]  Subjective:  Pt is a 67 year old married female who presented for the follow-up. She appeared tired during the interview. She reported that she has been diagnosed with seizure disorder last month and had 8 seizures followed with neurologist Dr. Brigitte Pulse brother passed away.  She reported that she has been feeling very tired recently.  She has also lost some weight.  She reported that she has been compliant with her medications.  We discussed about her medications in detail.  I also reviewed her notes from her neurologist.  Reported that she could not drive for the next 6 months and her husband is supportive.  We discussed about her medications.  Patient reported that she feels depressed and has been having memory issues at this time.  She has been compliant with her medications.  She sleeps well at night.  No acute issues noted at this time.     She denied having any suicidal homicidal ideations or plans.    Chief Complaint:  Chief Complaint    Follow-up; Medication Refill     Visit Diagnosis:     ICD-10-CM   1. Episodic mood disorder ([place]) F39     Past Medical History:  Past Medical History:  Diagnosis Date  . Anxiety   . Arthritis   . Atrial fibrillation, controlled ([place])   . Breast cancer ([place]) 2012   RT LUMPECTOMY  . CHF (congestive heart failure) ([place])   . COPD (chronic obstructive pulmonary disease) ([place])   . Coronary artery disease   . Depression   . Dysrhythmia    Afib  . GERD (gastroesophageal reflux disease)   . Headache   . Hypertension   . Hypothyroidism   . Neuromuscular disorder ([place])    nerve damage in legs  . Personal history of radiation therapy 2012   BREAST CA  . Restless leg syndrome   . Seizures ([place])   . Shortness of breath dyspnea     Past Surgical History:  Procedure Laterality Date  . APPENDECTOMY    . BREAST BIOPSY Left 07/2013   NEG  . BREAST BIOPSY Right  08/22/2016   US guided breast biopsy  . BREAST EXCISIONAL BIOPSY Right 2012   POS  . BREAST LUMPECTOMY     Right breast  . CARDIAC CATHETERIZATION N/A 03/02/2015   Procedure: Left Heart Cath and Coronary Angiography;  Surgeon: Teodoro Spray, MD;  Location: [facility];  Service: Cardiovascular;  Laterality: N/A;  . CATARACT EXTRACTION W/PHACO Right 10/14/2014   Procedure: CATARACT EXTRACTION PHACO AND INTRAOCULAR LENS PLACEMENT (IOC);  Surgeon: Leandrew Koyanagi, MD;  Location: [facility];  Service: Ophthalmology;  Laterality: Right;  . CORONARY ARTERY BYPASS GRAFT N/A 04/01/2015   Procedure: CORONARY ARTERY BYPASS GRAFTING (CABG) x one , using left mammary artery;  Surgeon: Ivin Poot, MD;  Location: [facility];  Service: Open Heart Surgery;  Laterality: N/A;  . JOINT REPLACEMENT Right    Total Knee Replacement  . TEE WITHOUT CARDIOVERSION N/A 04/01/2015   Procedure: TRANSESOPHAGEAL ECHOCARDIOGRAM (TEE);  Surgeon: Ivin Poot, MD;  Location: [facility];  Service: Open Heart Surgery;  Laterality: N/A;  . TONSILLECTOMY    . TOTAL KNEE ARTHROPLASTY Right 06/10/2014   Procedure: TOTAL KNEE ARTHROPLASTY;  Surgeon: Dereck Leep, MD;  Location: [facility];  Service: Orthopedics;  Laterality: Right;  . TOTAL KNEE ARTHROPLASTY Left 09/02/2014   Procedure: TOTAL KNEE  ARTHROPLASTY;  Surgeon: Dereck Leep, MD;  Location: [facility];  Service: Orthopedics;  Laterality: Left;   Family History:  Family History  Problem Relation Age of Onset  . Leukemia Mother   . Bone cancer Father   . Heart disease Brother   . Heart disease Brother   . Diabetes Brother   . Hypertension Brother   . Diabetes Brother   . Hypertension Brother   . Heart disease Brother   . Breast cancer Paternal Aunt    Social History:  Social History   Socioeconomic History  . Marital status: Married    Spouse name: Not on file  . Number of children: Not on file  . Years of education: Not on file  . Highest  education level: Not on file  Occupational History  . Not on file  Social Needs  . Financial resource strain: Not on file  . Food insecurity:    Worry: Not on file    Inability: Not on file  . Transportation needs:    Medical: Not on file    Non-medical: Not on file  Tobacco Use  . Smoking status: Former Smoker    Packs/day: 1.00    Types: Cigarettes    Last attempt to quit: 04/30/1992    Years since quitting: 25.1  . Smokeless tobacco: Never Used  Substance and Sexual Activity  . Alcohol use: No  . Drug use: No  . Sexual activity: Yes    Partners: Male    Birth control/protection: None  Lifestyle  . Physical activity:    Days per week: Not on file    Minutes per session: Not on file  . Stress: Not on file  Relationships  . Social connections:    Talks on phone: Not on file    Gets together: Not on file    Attends religious service: Not on file    Active member of club or organization: Not on file    Attends meetings of clubs or organizations: Not on file    Relationship status: Not on file  Other Topics Concern  . Not on file  Social History Narrative  . Not on file   Additional History:  She currently lives with her husband who is very helpful and has been taking care of her during her surgery. She reported that he is a good  nurse  Assessment:   Musculoskeletal: Strength & Muscle Tone: within normal limits Gait & Station: normal Patient leans: N/A  Psychiatric Specialty Exam: Medication Refill  Associated symptoms include chest pain. Pertinent negatives include no chills, congestion, nausea or rash.  Depression         Associated symptoms include does not have insomnia and no suicidal ideas.   Review of Systems  Constitutional: Negative for chills.  HENT: Negative for congestion and tinnitus.   Eyes: Negative for double vision.  Respiratory: Negative for hemoptysis.   Cardiovascular: Positive for chest pain. Negative for palpitations.   Gastrointestinal: Positive for diarrhea. Negative for nausea.  Genitourinary: Negative for urgency.  Musculoskeletal: Positive for joint pain. Negative for back pain.  Skin: Negative for rash.  Neurological: Negative for sensory change.  Endo/Heme/Allergies: Negative for environmental allergies.  Psychiatric/Behavioral: Positive for depression. Negative for substance abuse and suicidal ideas. The patient does not have insomnia.     Blood pressure (!) 110/59, pulse 77, temperature 99 F (37.2 C), temperature source Oral, weight 212 lb 12.8 oz (96.5 kg).Body mass index is 35.41 kg/m.  General Appearance: Casual  Eye Contact:  Fair  Speech:  Normal Rate  Volume:  Normal  Mood:  Anxious and Depressed  Affect:  Appropriate  Thought Process:  Coherent  Orientation:  Full (Time, Place, and Person)  Thought Content:  WDL  Suicidal Thoughts:  No  Homicidal Thoughts:  No  Memory:  Immediate;   Fair  Judgement:  Fair  Insight:  Fair  Psychomotor Activity:  Normal  Concentration:  Fair  Recall:  [employer] of Knowledge: Fair  Language: Fair  Akathisia:  No  Handed:  Right  AIMS (if indicated):  none  Assets:  Communication Skills Desire for Improvement Social Support  ADL's:  Intact  Cognition: WNL  Sleep:  8-10   Is the patient at risk to self?  No. Has the patient been a risk to self in the past 6 months?  No. Has the patient been a risk to self within the distant past?  No. Is the patient a risk to others?  No. Has the patient been a risk to others in the past 6 months?  No. Has the patient been a risk to others within the distant past?  No.  Current Medications: Current Outpatient Medications  Medication Sig Dispense Refill  . Artificial Tear Ointment (DRY EYES OP) Place 1 drop into both eyes at bedtime.    Marland Kitchen aspirin EC 81 MG tablet Take 81 mg by mouth.    Marland Kitchen atorvastatin (LIPITOR) 40 MG tablet Take 1 tablet (40 mg total) by mouth daily at 6 PM. 30 tablet 3  .  Cholecalciferol (VITAMIN D3) 2000 units capsule Take 2,000 Units by mouth daily.     . clonazePAM (KLONOPIN) 0.5 MG tablet Take 0.5 tablets (0.25 mg total) by mouth at bedtime. 15 tablet 3  . cyclobenzaprine (FLEXERIL) 10 MG tablet Take 10 mg by mouth daily as needed for muscle spasms.     Marland Kitchen esomeprazole (NEXIUM) 40 MG capsule Take 40 mg by mouth daily.     Marland Kitchen FLUoxetine (PROZAC) 40 MG capsule Take 1 capsule (40 mg total) by mouth daily. 90 capsule 1  . folic acid (FOLVITE) 1 MG tablet Take 1 mg by mouth 2 (two) times a week.    . gabapentin (NEURONTIN) 300 MG capsule Take 900 mg by mouth 3 (three) times daily.     Marland Kitchen lamoTRIgine (LAMICTAL) 25 MG tablet Take 2 tablets (50 mg total) by mouth daily. 180 tablet 1  . levothyroxine (SYNTHROID, LEVOTHROID) 25 MCG tablet Take 25 mcg by mouth daily.     Marland Kitchen loratadine (CLARITIN) 10 MG tablet Take 10 mg by mouth daily.     . meloxicam (MOBIC) 15 MG tablet Take 7.5 mg by mouth 2 (two) times daily.     . montelukast (SINGULAIR) 10 MG tablet Take 10 mg by mouth at bedtime.    Marland Kitchen oxybutynin (DITROPAN) 5 MG tablet Take 5 mg by mouth 3 (three) times daily as needed for bladder spasms.    . pramipexole (MIRAPEX) 0.25 MG tablet Take 0.25-0.5 mg by mouth 3 (three) times daily. Pt takes two tablets in the morning, one tablet at noon, and two tablets at bedtime.    . vitamin B-12 (CYANOCOBALAMIN) 1000 MCG tablet Take 1,000 mcg by mouth daily.    Marland Kitchen zolpidem (AMBIEN) 5 MG tablet TAKE 1 TABLET BY MOUTH AT BEDTIME     No current facility-administered medications for this visit.     Medical Decision Making:  Established Problem, Stable/Improving (1) and Review of Last Therapy  Session (1)  Treatment Plan Summary:Medication management  Depression Continue on Prozac 40 mg in the morning- 90 day supply given  Continue  lamotrigine dose has been titrated by her neurologist to 75 mg twice daily.Marland Kitchen  discontinue Klonopin  Continue Ambien 5 mg half to 1 pill at  bedtime   Follow-up She will follow-up in 1 months    More than 50% of the time spent in psychoeducation, counseling and coordination of care.     This note was generated in part or whole with voice recognition software. Voice regonition is usually quite accurate but there are transcription errors that can and very often do occur. I apologize for any typographical errors that were not detected and corrected.    Rainey Pines, MD  06/07/2017, 9:00 AM

## 2017-06-12 DIAGNOSIS — G40909 Epilepsy, unspecified, not intractable, without status epilepticus: Secondary | ICD-10-CM | POA: Diagnosis not present

## 2017-06-18 ENCOUNTER — Ambulatory Visit: Payer: PPO | Admitting: Psychiatry

## 2017-07-02 DIAGNOSIS — Z79899 Other long term (current) drug therapy: Secondary | ICD-10-CM | POA: Diagnosis not present

## 2017-07-09 ENCOUNTER — Encounter: Payer: Self-pay | Admitting: Psychiatry

## 2017-07-09 ENCOUNTER — Ambulatory Visit (INDEPENDENT_AMBULATORY_CARE_PROVIDER_SITE_OTHER): Payer: PPO | Admitting: Psychiatry

## 2017-07-09 ENCOUNTER — Other Ambulatory Visit: Payer: Self-pay

## 2017-07-09 VITALS — BP 122/71 | HR 76 | Temp 98.2°F | Wt 209.4 lb

## 2017-07-09 DIAGNOSIS — F39 Unspecified mood [affective] disorder: Secondary | ICD-10-CM

## 2017-07-09 MED ORDER — CLONAZEPAM 0.5 MG PO TABS: 0.2500 mg | ORAL_TABLET | Freq: Every day | ORAL | 2 refills | Status: DC

## 2017-07-09 MED ORDER — FLUOXETINE HCL 40 MG PO CAPS: 40.0000 mg | ORAL_CAPSULE | Freq: Every day | ORAL | 1 refills | Status: DC

## 2017-07-09 NOTE — Progress Notes (Signed)
BH MD/PA/NP OP Progress Note  07/09/2017 9:51 AM Tonya Walton  MRN:  [id number]  Subjective:  Pt is a 67 year old married female who presented for the follow-up. She appeared tired during the interview. She reported that she has been diagnosed with seizure disorder and she has been following with the neurologist on a regular basis.  They have adjusted her dosage of lamotrigine and has been monitoring her.  Patient reported that she is not allowed to drive for the next 6 months.  She was discussing that in detail.  She reported that she wants to have her medications reviewed about her seizures.  She reported that she has never been diagnosed with seizure since she was young.  This is  new diagnosis.  Patient reported that she feels tired after she had a seizure and her neurologist has explained to her in detail.  Patient currently denied having any suicidal ideations or plans.  She reported that she is compliant with her medications.  She is reported that she is having sleep problems and the Ambien is not helpful.  She wants to start the Klonopin again.    Her family is supportive at this time.  She appeared calm and alert during the interview.        She denied having any suicidal homicidal ideations or plans.    Chief Complaint:   Visit Diagnosis:     ICD-10-CM   1. Episodic mood disorder ([place]) F39     Past Medical History:  Past Medical History:  Diagnosis Date  . Anxiety   . Arthritis   . Atrial fibrillation, controlled ([place])   . Breast cancer ([place]) 2012   RT LUMPECTOMY  . CHF (congestive heart failure) ([place])   . COPD (chronic obstructive pulmonary disease) ([place])   . Coronary artery disease   . Depression   . Dysrhythmia    Afib  . GERD (gastroesophageal reflux disease)   . Headache   . Hypertension   . Hypothyroidism   . Neuromuscular disorder ([place])    nerve damage in legs  . Personal history of radiation therapy 2012   BREAST CA  . Restless leg syndrome   .  Seizures ([place])   . Shortness of breath dyspnea     Past Surgical History:  Procedure Laterality Date  . APPENDECTOMY    . BREAST BIOPSY Left 07/2013   NEG  . BREAST BIOPSY Right 08/22/2016   US guided breast biopsy  . BREAST EXCISIONAL BIOPSY Right 2012   POS  . BREAST LUMPECTOMY     Right breast  . CARDIAC CATHETERIZATION N/A 03/02/2015   Procedure: Left Heart Cath and Coronary Angiography;  Surgeon: Teodoro Spray, MD;  Location: [facility];  Service: Cardiovascular;  Laterality: N/A;  . CATARACT EXTRACTION W/PHACO Right 10/14/2014   Procedure: CATARACT EXTRACTION PHACO AND INTRAOCULAR LENS PLACEMENT (IOC);  Surgeon: Leandrew Koyanagi, MD;  Location: [facility];  Service: Ophthalmology;  Laterality: Right;  . CORONARY ARTERY BYPASS GRAFT N/A 04/01/2015   Procedure: CORONARY ARTERY BYPASS GRAFTING (CABG) x one , using left mammary artery;  Surgeon: Ivin Poot, MD;  Location: [facility];  Service: Open Heart Surgery;  Laterality: N/A;  . JOINT REPLACEMENT Right    Total Knee Replacement  . TEE WITHOUT CARDIOVERSION N/A 04/01/2015   Procedure: TRANSESOPHAGEAL ECHOCARDIOGRAM (TEE);  Surgeon: Ivin Poot, MD;  Location: [facility];  Service: Open Heart Surgery;  Laterality: N/A;  . TONSILLECTOMY    . TOTAL KNEE  ARTHROPLASTY Right 06/10/2014   Procedure: TOTAL KNEE ARTHROPLASTY;  Surgeon: Dereck Leep, MD;  Location: [facility];  Service: Orthopedics;  Laterality: Right;  . TOTAL KNEE ARTHROPLASTY Left 09/02/2014   Procedure: TOTAL KNEE ARTHROPLASTY;  Surgeon: Dereck Leep, MD;  Location: [facility];  Service: Orthopedics;  Laterality: Left;   Family History:  Family History  Problem Relation Age of Onset  . Leukemia Mother   . Bone cancer Father   . Heart disease Brother   . Heart disease Brother   . Diabetes Brother   . Hypertension Brother   . Diabetes Brother   . Hypertension Brother   . Heart disease Brother   . Breast cancer Paternal Aunt    Social History:   Social History   Socioeconomic History  . Marital status: Married    Spouse name: Not on file  . Number of children: Not on file  . Years of education: Not on file  . Highest education level: Not on file  Occupational History  . Not on file  Social Needs  . Financial resource strain: Not on file  . Food insecurity:    Worry: Not on file    Inability: Not on file  . Transportation needs:    Medical: Not on file    Non-medical: Not on file  Tobacco Use  . Smoking status: Former Smoker    Packs/day: 1.00    Types: Cigarettes    Last attempt to quit: 04/30/1992    Years since quitting: 25.2  . Smokeless tobacco: Never Used  Substance and Sexual Activity  . Alcohol use: No  . Drug use: No  . Sexual activity: Yes    Partners: Male    Birth control/protection: None  Lifestyle  . Physical activity:    Days per week: Not on file    Minutes per session: Not on file  . Stress: Not on file  Relationships  . Social connections:    Talks on phone: Not on file    Gets together: Not on file    Attends religious service: Not on file    Active member of club or organization: Not on file    Attends meetings of clubs or organizations: Not on file    Relationship status: Not on file  Other Topics Concern  . Not on file  Social History Narrative  . Not on file   Additional History:  She currently lives with her husband who is very helpful and has been taking care of her during her surgery. She reported that he is a good  nurse  Assessment:   Musculoskeletal: Strength & Muscle Tone: within normal limits Gait & Station: normal Patient leans: N/A  Psychiatric Specialty Exam: Medication Refill  Associated symptoms include chest pain. Pertinent negatives include no chills, congestion, nausea or rash.  Depression         Associated symptoms include does not have insomnia and no suicidal ideas.   Review of Systems  Constitutional: Negative for chills.  HENT: Negative for  congestion and tinnitus.   Eyes: Negative for double vision.  Respiratory: Negative for hemoptysis.   Cardiovascular: Positive for chest pain. Negative for palpitations.  Gastrointestinal: Positive for diarrhea. Negative for nausea.  Genitourinary: Negative for urgency.  Musculoskeletal: Positive for joint pain. Negative for back pain.  Skin: Negative for rash.  Neurological: Negative for sensory change.  Endo/Heme/Allergies: Negative for environmental allergies.  Psychiatric/Behavioral: Positive for depression. Negative for substance abuse and suicidal ideas. The patient does  not have insomnia.     There were no vitals taken for this visit.There is no height or weight on file to calculate BMI.  General Appearance: Casual  Eye Contact:  Fair  Speech:  Normal Rate  Volume:  Normal  Mood:  Anxious and Depressed  Affect:  Appropriate  Thought Process:  Coherent  Orientation:  Full (Time, Place, and Person)  Thought Content:  WDL  Suicidal Thoughts:  No  Homicidal Thoughts:  No  Memory:  Immediate;   Fair  Judgement:  Fair  Insight:  Fair  Psychomotor Activity:  Normal  Concentration:  Fair  Recall:  [employer] of Knowledge: Fair  Language: Fair  Akathisia:  No  Handed:  Right  AIMS (if indicated):  none  Assets:  Communication Skills Desire for Improvement Social Support  ADL's:  Intact  Cognition: WNL  Sleep:  8-10   Is the patient at risk to self?  No. Has the patient been a risk to self in the past 6 months?  No. Has the patient been a risk to self within the distant past?  No. Is the patient a risk to others?  No. Has the patient been a risk to others in the past 6 months?  No. Has the patient been a risk to others within the distant past?  No.  Current Medications: Current Outpatient Medications  Medication Sig Dispense Refill  . Artificial Tear Ointment (DRY EYES OP) Place 1 drop into both eyes at bedtime.    Marland Kitchen aspirin EC 81 MG tablet Take 81 mg by mouth.    Marland Kitchen  atorvastatin (LIPITOR) 40 MG tablet Take 1 tablet (40 mg total) by mouth daily at 6 PM. 30 tablet 3  . Cholecalciferol (VITAMIN D3) 2000 units capsule Take 2,000 Units by mouth daily.     . cyclobenzaprine (FLEXERIL) 10 MG tablet Take 10 mg by mouth daily as needed for muscle spasms.     Marland Kitchen esomeprazole (NEXIUM) 40 MG capsule Take 40 mg by mouth daily.     Marland Kitchen FLUoxetine (PROZAC) 40 MG capsule Take 1 capsule (40 mg total) by mouth daily. 90 capsule 1  . folic acid (FOLVITE) 1 MG tablet Take 1 mg by mouth 2 (two) times a week.    . gabapentin (NEURONTIN) 300 MG capsule Take 900 mg by mouth 3 (three) times daily.     Marland Kitchen lamoTRIgine (LAMICTAL) 25 MG tablet 75 mg po BID - managed by neurologist 180 tablet 1  . levothyroxine (SYNTHROID, LEVOTHROID) 25 MCG tablet Take 25 mcg by mouth daily.     Marland Kitchen loratadine (CLARITIN) 10 MG tablet Take 10 mg by mouth daily.     . meloxicam (MOBIC) 15 MG tablet Take 7.5 mg by mouth 2 (two) times daily.     . montelukast (SINGULAIR) 10 MG tablet Take 10 mg by mouth at bedtime.    Marland Kitchen oxybutynin (DITROPAN) 5 MG tablet Take 5 mg by mouth 3 (three) times daily as needed for bladder spasms.    . pramipexole (MIRAPEX) 0.25 MG tablet Take 0.25-0.5 mg by mouth 3 (three) times daily. Pt takes two tablets in the morning, one tablet at noon, and two tablets at bedtime.    . vitamin B-12 (CYANOCOBALAMIN) 1000 MCG tablet Take 1,000 mcg by mouth daily.    Marland Kitchen zolpidem (AMBIEN) 5 MG tablet Take 1 tablet (5 mg total) by mouth at bedtime. 1/2- 1 pill daily 30 tablet 1   No current facility-administered medications for this visit.  Medical Decision Making:  Established Problem, Stable/Improving (1) and Review of Last Therapy Session (1)  Treatment Plan Summary:Medication management  Depression Continue on Prozac 40 mg in the morning- 90 day supply given  Continue  lamotrigine dose has been titrated by her neurologist to 75 mg twice daily..  I will start her on the Klonopin 0.25 mg at  bedtime.  Discontinue Ambien   Follow-up She will follow-up in 2 months    More than 50% of the time spent in psychoeducation, counseling and coordination of care.     This note was generated in part or whole with voice recognition software. Voice regonition is usually quite accurate but there are transcription errors that can and very often do occur. I apologize for any typographical errors that were not detected and corrected.    Rainey Pines, MD  07/09/2017, 9:51 AM

## 2017-07-11 DIAGNOSIS — R2689 Other abnormalities of gait and mobility: Secondary | ICD-10-CM | POA: Diagnosis not present

## 2017-07-11 DIAGNOSIS — G2581 Restless legs syndrome: Secondary | ICD-10-CM | POA: Diagnosis not present

## 2017-07-11 DIAGNOSIS — G609 Hereditary and idiopathic neuropathy, unspecified: Secondary | ICD-10-CM | POA: Insufficient documentation

## 2017-07-11 DIAGNOSIS — E538 Deficiency of other specified B group vitamins: Secondary | ICD-10-CM | POA: Diagnosis not present

## 2017-07-11 DIAGNOSIS — R55 Syncope and collapse: Secondary | ICD-10-CM | POA: Diagnosis not present

## 2017-07-16 ENCOUNTER — Encounter: Payer: Self-pay | Admitting: Physical Therapy

## 2017-07-16 ENCOUNTER — Other Ambulatory Visit: Payer: Self-pay

## 2017-07-16 ENCOUNTER — Ambulatory Visit: Payer: PPO | Attending: Neurology | Admitting: Physical Therapy

## 2017-07-16 DIAGNOSIS — Z9181 History of falling: Secondary | ICD-10-CM | POA: Diagnosis not present

## 2017-07-16 DIAGNOSIS — M6281 Muscle weakness (generalized): Secondary | ICD-10-CM | POA: Insufficient documentation

## 2017-07-16 DIAGNOSIS — R2689 Other abnormalities of gait and mobility: Secondary | ICD-10-CM

## 2017-07-16 NOTE — Patient Instructions (Signed)
Provided home program for tandem stance and single leg stance.

## 2017-07-16 NOTE — Therapy (Addendum)
[place] MAIN [facility] SERVICES [address] [place], [place], [address] Phone: [phone]   Fax:  [phone]  Physical Therapy Evaluation  Patient Details  Name: Tonya Walton MRN: [id number] Date of Birth: 03-03-1950 Referring Provider: Dr. Manuella Ghazi   Encounter Date: 07/16/2017  PT End of Session - 07/16/17 1008    Visit Number  1    Number of Visits  17    Date for PT Re-Evaluation  09/10/17    Authorization Type  progress note 1/10    PT Start Time  0904    PT Stop Time  1000    PT Time Calculation (min)  56 min    Equipment Utilized During Treatment  Gait belt    Activity Tolerance  Patient tolerated treatment well    Behavior During Therapy  [facility] for tasks assessed/performed       Past Medical History:  Diagnosis Date  . Anxiety   . Arthritis   . Atrial fibrillation, controlled ([place])   . Breast cancer ([place]) 2012   RT LUMPECTOMY  . CHF (congestive heart failure) ([place])   . COPD (chronic obstructive pulmonary disease) ([place])   . Coronary artery disease   . Depression   . Dysrhythmia    Afib  . GERD (gastroesophageal reflux disease)   . Headache   . Hypertension   . Hypothyroidism   . Neuromuscular disorder ([place])    nerve damage in legs  . Personal history of radiation therapy 2012   BREAST CA  . Restless leg syndrome   . Seizures ([place])   . Shortness of breath dyspnea     Past Surgical History:  Procedure Laterality Date  . APPENDECTOMY    . BREAST BIOPSY Left 07/2013   NEG  . BREAST BIOPSY Right 08/22/2016   US guided breast biopsy  . BREAST EXCISIONAL BIOPSY Right 2012   POS  . BREAST LUMPECTOMY     Right breast  . CARDIAC CATHETERIZATION N/A 03/02/2015   Procedure: Left Heart Cath and Coronary Angiography;  Surgeon: Teodoro Spray, MD;  Location: [place] [facility];  Service: Cardiovascular;  Laterality: N/A;  . CATARACT EXTRACTION W/PHACO Right 10/14/2014   Procedure: CATARACT EXTRACTION PHACO AND INTRAOCULAR  LENS PLACEMENT (IOC);  Surgeon: Leandrew Koyanagi, MD;  Location: [place];  Service: Ophthalmology;  Laterality: Right;  . CORONARY ARTERY BYPASS GRAFT N/A 04/01/2015   Procedure: CORONARY ARTERY BYPASS GRAFTING (CABG) x one , using left mammary artery;  Surgeon: Ivin Poot, MD;  Location: [place];  Service: Open Heart Surgery;  Laterality: N/A;  . JOINT REPLACEMENT Right    Total Knee Replacement  . TEE WITHOUT CARDIOVERSION N/A 04/01/2015   Procedure: TRANSESOPHAGEAL ECHOCARDIOGRAM (TEE);  Surgeon: Ivin Poot, MD;  Location: [place];  Service: Open Heart Surgery;  Laterality: N/A;  . TONSILLECTOMY    . TOTAL KNEE ARTHROPLASTY Right 06/10/2014   Procedure: TOTAL KNEE ARTHROPLASTY;  Surgeon: Dereck Leep, MD;  Location: [facility];  Service: Orthopedics;  Laterality: Right;  . TOTAL KNEE ARTHROPLASTY Left 09/02/2014   Procedure: TOTAL KNEE ARTHROPLASTY;  Surgeon: Dereck Leep, MD;  Location: [facility];  Service: Orthopedics;  Laterality: Left;    There were no vitals filed for this visit.   Subjective Assessment - 07/16/17 0906    Subjective  Pt reports she has had 10 seizures in the last 2 and a half months; on anti-seizure medication. Pt reports she loses her balance very easily and  has gotten worse progressively over the last 2 years.     Pertinent History  66 y.o. female referred to physical therapy for imbalance. PMH includes arthritis, A. fib, CHF, COPD, GERD, hypertension, and breast cancer with radiation. Peripheral neuropathy due to vitamin B deficiency with related imbalance. MD recommended cane on regular basis for ambulation. Pt reports 10 seizures in the last 2 and a half months and roughly 10 falls in the last 2 months. Pt reports staggering with ambulation and general loss of balance in prolonged standing. Pt reports feeling weaker on L side; supported by objective strength findings.   Limitations  Walking;Standing    How long can you sit comfortably?  NA but  sometimes loses balance when sitting    How long can you stand comfortably?  loses balance after about 5 min    How long can you walk comfortably?  imbalance often immediately; staggers     Patient Stated Goals  Help with the staggering    Currently in Pain?  Yes    Pain Score  4     Pain Location  Leg    Pain Orientation  Right;Left    Pain Descriptors / Indicators  Burning;Aching    Pain Type  Chronic pain    Pain Onset  More than a month ago    Pain Frequency  Constant    Aggravating Factors   sitting or standing too long    Pain Relieving Factors  laying supine    Effect of Pain on Daily Activities  does not like to go out when really bad and does limited household chores     Multiple Pain Sites  No         [facility] PT Assessment - 07/16/17 0001      Assessment   Medical Diagnosis  Imbalance    Referring Provider  Dr. Manuella Ghazi    Onset Date/Surgical Date  -- Always had some balance problems but gotten worse in 2 years    Hand Dominance  Right    Next MD Visit  4 months    Prior Therapy  Not PT for balance issues      Precautions   Precautions  Fall      Restrictions   Weight Bearing Restrictions  No      Balance Screen   Has the patient fallen in the past 6 months  Yes    How many times?  10    Has the patient had a decrease in activity level because of a fear of falling?   Yes    Is the patient reluctant to leave their home because of a fear of falling?   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[employer]; Theme park manager   Overall  Cognitive Status  Within Functional Limits for tasks assessed      Observation/Other Assessments   Observations  Rounded shoulders, slight forward head    Activities of Balance Confidence Scale (ABC Scale)   21% (low level of physical functioning)      Sensation   Light Touch  Appears Intact      Coordination   Gross Motor Movements are Fluid and Coordinated  Yes    Fine Motor Movements are Fluid and Coordinated  No Slowed movements    Finger Nose Finger Test  Accurate but decreased speed      ROM / Strength   AROM / PROM / Strength  Strength      Strength   Right/Left Hip  --    Right Hip Flexion  4/5    Left Hip Flexion  4-/5    Right Knee Flexion  4/5    Right Knee Extension  4/5    Left Knee Flexion  4-/5    Left Knee Extension  4-/5    Right Ankle Dorsiflexion  4/5    Left Ankle Dorsiflexion  4/5      Ambulation/Gait   Gait Comments  Decreased gait speed, wearing flip flops today and would like to see in more supportive footwear; decreased step length and arm swing, requires supervision       Standardized Balance Assessment   Five times sit to stand comments   18.1 sec, uses UE on thighs to push to stand (increased fall risk)    10 Meter Walk  0.78 m/s (12.7 sec) no AD; increased fall risk and limited community ambulator       [employer]   Sit to Stand  Able to stand without using hands and stabilize independently    Standing Unsupported  Able to stand 2 minutes with supervision    Sitting with Back Unsupported but Feet Supported on Floor or Stool  Able to sit safely and securely 2 minutes    Stand to Sit  Sits safely with minimal use of hands    Transfers  Able to transfer safely, minor use of hands    Standing Unsupported with Eyes Closed  Able to stand 10 seconds with supervision    Standing Ubsupported with Feet Together  Able  to place feet together independently and stand for 1 minute with supervision    From Standing, Reach Forward with Outstretched Arm  Can reach forward >5 cm safely (2")    From Standing Position, Pick up Object from Floor  Able to pick up shoe, needs supervision    From Standing Position, Turn to Look Behind Over each Shoulder  Looks behind one side only/other side shows less weight shift    Turn 360 Degrees  Able to turn 360 degrees safely in 4 seconds or less    Standing Unsupported, Alternately Place Feet on Step/Stool  Able to complete >2 steps/needs minimal assist    Standing Unsupported, One Foot in Front  Able to take small step independently and hold 30 seconds    Standing on One Leg  Able to lift leg independently and hold equal to or more than 3 seconds    Total Score  42    Berg comment:  Significant fall risk (> 80%)                Objective measurements completed on examination: See above findings.   TREATMENT: Instructed pt in HEP for balance: SLS on firm surface with HHA  on counter 5 sec hold x2 reps bilaterally; Semi-tandem stance on firm surface with HHA on counter/sink, 5 sec hold x2 reps each foot in front;  Patient educated on balance safety with HEP with instruction to use counter as needed and to monitor symptoms.  Provided written HEP for adherence;            PT Education - 07/16/17 1006    Education Details  balance and strength assessment; HEP; plan of care    Person(s) Educated  Patient    Methods  Explanation;Demonstration;Verbal cues;Handout    Comprehension  Verbalized understanding;Returned demonstration;Verbal cues required;Need further instruction       PT Short Term Goals - 07/16/17 1020      PT SHORT TERM GOAL #1   Title  Pt will increase LLE strength to 4/5 to improve functional strength for independent gait, increased standing tolerance and increased ADL ability.    Time  4    Period  Weeks    Status  New    Target Date   08/13/17      PT SHORT TERM GOAL #2   Title  Pt will decrease 5 times sit-to-stand time to < 14 sec to demonstrate decreased fall risk and improved functional mobility.     Time  4    Period  Weeks    Status  New    Target Date  08/13/17      PT SHORT TERM GOAL #3   Title  Patient will report no new falls (except those related to seizures) in the last month to exhibit improved safety awareness and improved balance with ADLs.     Time  4    Period  Weeks    Status  New    Target Date  08/13/17        PT Long Term Goals - 07/16/17 1030      PT LONG TERM GOAL #1   Title  Patient will be independent in home exercise program to improve strength/mobility for better functional independence with ADLs.    Time  8    Period  Weeks    Status  New    Target Date  09/10/17      PT LONG TERM GOAL #2   Title  Pt will improve Berg Balance Assessment score by 4 points to decrease fall risk in home and community environment.     Baseline  07/16/17: 42/56 (significant fall risk)    Time  8    Period  Weeks    Status  New    Target Date  09/10/17      PT LONG TERM GOAL #3   Title  Patient will increase 10 meter walk test to >1.67m/s as to improve gait speed for better community ambulation and to reduce fall risk.    Baseline  07/16/17: 0.78 m/s    Time  8    Period  Weeks    Status  New    Target Date  09/10/17      PT LONG TERM GOAL #4   Title  Patient will increase BLE gross strength to 4+/5 as to improve functional strength for independent gait, increased standing tolerance and increased ADL ability.     Time  8    Period  Weeks    Status  New    Target Date  09/10/17             Plan - 07/16/17 1010    Clinical Impression Statement  Patient is a 67 y.o. female presenting with balance deficits to physical therapy. Pt reports she experiences seizures and often staggers with ambulation. Pt presents with some weakness in LLE compared to RLE. She tested as a high fall risk with  Berg Balance Assessment. She does exhibit unsteadiness with static balance as well as dynamic. Would benefit from additional dynamic balance testing. Patient presented to therapy evaluation wearing flip flops which could have affected accuracy of test. Educated patient on proper footwear for better balance and safety.  Pt will benefit from skilled therapy services to address balance impairments, LE strength deficits, and gait safety.     History and Personal Factors relevant to plan of care:  (+) motivated, family support (-) seizures, co-morbidities, steps to enter the house, frequent falls     Clinical Presentation  Unstable    Clinical Presentation due to:  seizures, high frequency of falls, co-morbidities    Clinical Decision Making  High    Rehab Potential  Fair    Clinical Impairments Affecting Rehab Potential  (-) seizures, high frequency of falls, co-morbidities (+) motivated, family support    PT Frequency  2x / week    PT Duration  8 weeks    PT Treatment/Interventions  Cryotherapy;Moist Heat;Gait training;Stair training;Functional mobility training;Therapeutic activities;Therapeutic exercise;Balance training;Neuromuscular re-education;Patient/family education;Visual/perceptual remediation/compensation    PT Next Visit Plan  Assess dynamic balance (DGI, FGA); progress HEP; work on static and dynamic balance    PT Home Exercise Plan  single limb stance, tandem stance     Consulted and Agree with Plan of Care  Patient       Patient will benefit from skilled therapeutic intervention in order to improve the following deficits and impairments:  Abnormal gait, Decreased activity tolerance, Decreased balance, Decreased coordination, Decreased endurance, Decreased mobility, Decreased safety awareness, Difficulty walking, Decreased strength, Dizziness, Impaired vision/preception, Postural dysfunction  Visit Diagnosis: Other abnormalities of gait and mobility  Muscle weakness  (generalized)  History of falling     Problem List Patient Active Problem List   Diagnosis Date Noted  . Seizure disorder ([place]) 06/12/2017  . Syncope 05/25/2017  . Medicare annual wellness visit, initial 02/24/2016  . Chest pain 04/01/2015  . CAD in native artery 03/10/2015  . Unstable angina pectoris ([place]) 02/23/2015  . Benign essential HTN 10/20/2014  . Chronic anxiety 10/20/2014  . Anxiety, generalized 10/20/2014  . Insomnia, persistent 10/20/2014  . Recurrent major depressive episodes ([place]) 10/20/2014  . Depression, major, single episode, severe ([place]) 10/20/2014  . Combined fat and carbohydrate induced hyperlipemia 10/20/2014  . Restless leg syndrome 10/20/2014  . MDD (major depressive disorder), recurrent episode, moderate ([place]) 10/20/2014  . AF (paroxysmal atrial fibrillation) ([place]) 07/31/2014  . Paroxysmal atrial fibrillation ([place]) 07/31/2014  . H/O total knee replacement 06/25/2014  . Total knee replacement status 06/10/2014  . H/O malignant neoplasm of breast 05/24/2014  . Restless leg 05/24/2014  . H/O: HTN (hypertension) 05/24/2014  . History of atrial fibrillation 05/24/2014  . CAFL (chronic airflow limitation) ([place]) 05/24/2014  . Vitamin B12 deficiency 05/24/2014  . H/O disease 05/24/2014  . H/O: hypothyroidism 05/24/2014  . H/O gastrointestinal disease 05/24/2014  . [place] anemia 02/16/2014  . Anxiety 11/06/2013  . A-fib ([place]) 11/06/2013  . Clinical depression 11/06/2013  . Acid reflux 11/06/2013  . Heart valve disease 11/06/2013  . Primary cancer of right female breast ([place]) 11/06/2013  . HLD (hyperlipidemia) 11/06/2013  . BP (high blood pressure) 11/06/2013  . Atrial fibrillation ([place]) 11/06/2013  .  Chronic obstructive pulmonary disease ([place]) 11/06/2013  . Hormone receptor positive malignant neoplasm of breast ([place]) 11/06/2013  . Adult hypothyroidism 06/29/2010  . Adult BMI 30+ 06/29/2010  . B-complex deficiency 12/05/1995  . Extremity pain  12/05/1995   Harriet Masson, SPT This entire session was performed under direct supervision and direction of a licensed therapist/therapist assistant . I have personally read, edited and approve of the note as written.  Trotter,Margaret DPT 07/16/2017, 1:22 PM  [place] MAIN [facility] SERVICES [address] [place], [place], [address] Phone: [phone]   Fax:  [phone]  Name: Tonya Walton MRN: [id number] Date of Birth: 11-20-1950

## 2017-07-24 ENCOUNTER — Ambulatory Visit: Payer: PPO | Admitting: Physical Therapy

## 2017-07-26 ENCOUNTER — Ambulatory Visit: Payer: PPO | Admitting: Physical Therapy

## 2017-08-01 ENCOUNTER — Ambulatory Visit: Payer: PPO | Admitting: Physical Therapy

## 2017-08-07 ENCOUNTER — Ambulatory Visit: Payer: PPO | Admitting: Physical Therapy

## 2017-08-14 ENCOUNTER — Encounter: Payer: Self-pay | Admitting: Physical Therapy

## 2017-08-14 ENCOUNTER — Ambulatory Visit: Payer: PPO | Attending: Neurology | Admitting: Physical Therapy

## 2017-08-14 DIAGNOSIS — R2689 Other abnormalities of gait and mobility: Secondary | ICD-10-CM | POA: Diagnosis not present

## 2017-08-14 DIAGNOSIS — M6281 Muscle weakness (generalized): Secondary | ICD-10-CM | POA: Diagnosis not present

## 2017-08-14 DIAGNOSIS — Z9181 History of falling: Secondary | ICD-10-CM | POA: Diagnosis not present

## 2017-08-14 NOTE — Therapy (Addendum)
[place] MAIN [facility] SERVICES [address] [place], [place], [address] Phone: [phone]   Fax:  [phone]  Physical Therapy Treatment  Patient Details  Name: Tonya Walton MRN: [id number] Date of Birth: 03/03/1950 Referring Provider: Dr. Manuella Ghazi   Encounter Date: 08/14/2017  PT End of Session - 08/14/17 0759    Visit Number  2    Number of Visits  17    Date for PT Re-Evaluation  09/10/17    Authorization Type  progress note 2/10    PT Start Time  0800    PT Stop Time  0845    PT Time Calculation (min)  45 min    Equipment Utilized During Treatment  Gait belt    Activity Tolerance  Patient tolerated treatment well    Behavior During Therapy  [facility] for tasks assessed/performed       Past Medical History:  Diagnosis Date  . Anxiety   . Arthritis   . Atrial fibrillation, controlled ([place])   . Breast cancer ([place]) 2012   RT LUMPECTOMY  . CHF (congestive heart failure) ([place])   . COPD (chronic obstructive pulmonary disease) ([place])   . Coronary artery disease   . Depression   . Dysrhythmia    Afib  . GERD (gastroesophageal reflux disease)   . Headache   . Hypertension   . Hypothyroidism   . Neuromuscular disorder ([place])    nerve damage in legs  . Personal history of radiation therapy 2012   BREAST CA  . Restless leg syndrome   . Seizures ([place])   . Shortness of breath dyspnea     Past Surgical History:  Procedure Laterality Date  . APPENDECTOMY    . BREAST BIOPSY Left 07/2013   NEG  . BREAST BIOPSY Right 08/22/2016   US guided breast biopsy  . BREAST EXCISIONAL BIOPSY Right 2012   POS  . BREAST LUMPECTOMY     Right breast  . CARDIAC CATHETERIZATION N/A 03/02/2015   Procedure: Left Heart Cath and Coronary Angiography;  Surgeon: Teodoro Spray, MD;  Location: [place] CV LAB;  Service: Cardiovascular;  Laterality: N/A;  . CATARACT EXTRACTION W/PHACO Right 10/14/2014   Procedure: CATARACT EXTRACTION PHACO AND INTRAOCULAR  LENS PLACEMENT (IOC);  Surgeon: Leandrew Koyanagi, MD;  Location: [place];  Service: Ophthalmology;  Laterality: Right;  . CORONARY ARTERY BYPASS GRAFT N/A 04/01/2015   Procedure: CORONARY ARTERY BYPASS GRAFTING (CABG) x one , using left mammary artery;  Surgeon: Ivin Poot, MD;  Location: [place];  Service: Open Heart Surgery;  Laterality: N/A;  . JOINT REPLACEMENT Right    Total Knee Replacement  . TEE WITHOUT CARDIOVERSION N/A 04/01/2015   Procedure: TRANSESOPHAGEAL ECHOCARDIOGRAM (TEE);  Surgeon: Ivin Poot, MD;  Location: [place];  Service: Open Heart Surgery;  Laterality: N/A;  . TONSILLECTOMY    . TOTAL KNEE ARTHROPLASTY Right 06/10/2014   Procedure: TOTAL KNEE ARTHROPLASTY;  Surgeon: Dereck Leep, MD;  Location: [facility] OR[facility];  Service: Orthopedics;  Laterality: Right;  . TOTAL KNEE ARTHROPLASTY Left 09/02/2014   Procedure: TOTAL KNEE ARTHROPLASTY;  Surgeon: Dereck Leep, MD;  Location: [facility];  Service: Orthopedics;  Laterality: Left;    There were no vitals filed for this visit.  Subjective Assessment - 08/14/17 0805    Subjective  Pt reports enjoying trip to [place] for 2 weeks; denies any new falls. Pt reports still having leg pain but nothing more than usual. Pt states she believes  seizure medicine is helping and has not had any seizures since last visit.     Pertinent History  67 y.o. female referred to physical therapy for imbalance. PMH includes arthritis, A. fib, CHF, COPD, GERD, hypertension, and breast cancer with radiation. Peripheral neuropathy due to vitamin B deficiency with related imbalance. MD recommended cane on regular basis for ambulation. Pt reports 10 seizures in the last 2 and a half months and roughly 10 falls in the last 2 months. Pt reports staggering with ambulation and general loss of balance in prolonged standing. Pt reports feeling weaker on L side; supported by objective strength findings.     Limitations  Walking;Standing    How long can you  sit comfortably?  NA but sometimes loses balance when sitting    How long can you stand comfortably?  loses balance after about 5 min    How long can you walk comfortably?  imbalance often immediately; staggers     Patient Stated Goals  Help with the staggering    Currently in Pain?  Yes    Pain Score  2     Pain Location  Leg    Pain Orientation  Right;Left    Pain Descriptors / Indicators  Aching;Burning    Pain Type  Chronic pain    Pain Onset  More than a month ago    Pain Frequency  Constant    Multiple Pain Sites  No        [facility] PT Assessment - 08/14/17 0001      Dynamic Gait Index   Level Surface  Mild Impairment    Change in Gait Speed  Mild Impairment    Gait with Horizontal Head Turns  Moderate Impairment    Gait with Vertical Head Turns  Moderate Impairment    Gait and Pivot Turn  Moderate Impairment    Step Over Obstacle  Moderate Impairment    Step Around Obstacles  Severe Impairment    Steps  Moderate Impairment    Total Score  9    DGI comment:  Significant fall risk      Treatment Warm-up on cross trainer level 3 x5 min (unbilled)   Assess dynamic balance with DGI. Pt demonstrated significant fall risk with a 9/24  1/2 foam roll flat side up in // bars: AP rocking x2 min bouts x3 bouts with faded UE support to fingertips on // bars, CGA- min A for stability and loss of balance, VCs for motion at ankles rather than hips to work on ankle strengthening and using ankle strategy to prevent LOB Standing, balanced in center, with faded UE support until no UE support utilized, CGA-min A for stability, VCs to utilize ankle strategy to maintain center and balance Tandem stance with faded UE support to single fingertips on // bar, x1 min holds bilaterally, CGA-min A for maintaining balance, VCs for maintaining upright posture and gaining confidence to decrease UE support  Airex pad: Standing on airex pad x3 min with faded UE support, CGA-min A without UE support, VCs  to utilize ankles and look ahead to maintain upright posture Standing on airex pad, eyes closed, no UE support x10 sec bouts x2 bouts, min A-mod A to prevent LOB with increased posterior sway; VCs for utilizing ankles and hips to maintain standing balance            PT Education - 08/14/17 0758    Education Details  balance, strengthening    Person(s) Educated  Patient  Methods  Explanation;Demonstration;Verbal cues    Comprehension  Verbalized understanding;Returned demonstration;Verbal cues required;Need further instruction       PT Short Term Goals - 07/16/17 1020      PT SHORT TERM GOAL #1   Title  Pt will increase LLE strength to 4/5 to improve functional strength for independent gait, increased standing tolerance and increased ADL ability.    Time  4    Period  Weeks    Status  New    Target Date  08/13/17      PT SHORT TERM GOAL #2   Title  Pt will decrease 5 times sit-to-stand time to < 14 sec to demonstrate decreased fall risk and improved functional mobility.     Time  4    Period  Weeks    Status  New    Target Date  08/13/17      PT SHORT TERM GOAL #3   Title  Patient will report no new falls (except those related to seizures) in the last month to exhibit improved safety awareness and improved balance with ADLs.     Time  4    Period  Weeks    Status  New    Target Date  08/13/17        PT Long Term Goals - 07/16/17 1030      PT LONG TERM GOAL #1   Title  Patient will be independent in home exercise program to improve strength/mobility for better functional independence with ADLs.    Time  8    Period  Weeks    Status  New    Target Date  09/10/17      PT LONG TERM GOAL #2   Title  Pt will improve Berg Balance Assessment score by 4 points to decrease fall risk in home and community environment.     Baseline  07/16/17: 42/56 (significant fall risk)    Time  8    Period  Weeks    Status  New    Target Date  09/10/17      PT LONG TERM GOAL #3    Title  Patient will increase 10 meter walk test to >1.38m/s as to improve gait speed for better community ambulation and to reduce fall risk.    Baseline  07/16/17: 0.78 m/s    Time  8    Period  Weeks    Status  New    Target Date  09/10/17      PT LONG TERM GOAL #4   Title  Patient will increase BLE gross strength to 4+/5 as to improve functional strength for independent gait, increased standing tolerance and increased ADL ability.     Time  8    Period  Weeks    Status  New    Target Date  09/10/17            Plan - 08/14/17 0800    Clinical Impression Statement  Patient tolerated therapy session well. Pt completed DGI (9/24) and demonstrated significant impairment with stepping around obstacles, over obstacles, gait with head turns, and turning. PT educated pt on DGI score and interpretation for increased fall risk at this time; pt agreeable to work on tasks included in DGI as well. Pt performed static postural control activities standing on 1/2 foam roll and airex pad including AP rocks, tandem stance, and quiet stance with eyes open and eyes closed. Pt verbalized mild fatigue at end of session. Pt will continue to  benefit from skilled PT for improvements in balance, strength, and gait safety.    Rehab Potential  Fair    Clinical Impairments Affecting Rehab Potential  (-) seizures, high frequency of falls, co-morbidities (+) motivated, family support    PT Frequency  2x / week    PT Duration  8 weeks    PT Treatment/Interventions  Cryotherapy;Moist Heat;Gait training;Stair training;Functional mobility training;Therapeutic activities;Therapeutic exercise;Balance training;Neuromuscular re-education;Patient/family education;Visual/perceptual remediation/compensation    PT Next Visit Plan  Assess dynamic balance (DGI, FGA); progress HEP; work on static and dynamic balance    PT Home Exercise Plan  single limb stance, tandem stance     Consulted and Agree with Plan of Care  Patient        Patient will benefit from skilled therapeutic intervention in order to improve the following deficits and impairments:  Abnormal gait, Decreased activity tolerance, Decreased balance, Decreased coordination, Decreased endurance, Decreased mobility, Decreased safety awareness, Difficulty walking, Decreased strength, Dizziness, Impaired vision/preception, Postural dysfunction  Visit Diagnosis: Other abnormalities of gait and mobility  Muscle weakness (generalized)  History of falling     Problem List Patient Active Problem List   Diagnosis Date Noted  . Seizure disorder ([place]) 06/12/2017  . Syncope 05/25/2017  . Medicare annual wellness visit, initial 02/24/2016  . Chest pain 04/01/2015  . CAD in native artery 03/10/2015  . Unstable angina pectoris ([place]) 02/23/2015  . Benign essential HTN 10/20/2014  . Chronic anxiety 10/20/2014  . Anxiety, generalized 10/20/2014  . Insomnia, persistent 10/20/2014  . Recurrent major depressive episodes ([place]) 10/20/2014  . Depression, major, single episode, severe ([place]) 10/20/2014  . Combined fat and carbohydrate induced hyperlipemia 10/20/2014  . Restless leg syndrome 10/20/2014  . MDD (major depressive disorder), recurrent episode, moderate ([place]) 10/20/2014  . AF (paroxysmal atrial fibrillation) ([place]) 07/31/2014  . Paroxysmal atrial fibrillation ([place]) 07/31/2014  . H/O total knee replacement 06/25/2014  . Total knee replacement status 06/10/2014  . H/O malignant neoplasm of breast 05/24/2014  . Restless leg 05/24/2014  . H/O: HTN (hypertension) 05/24/2014  . History of atrial fibrillation 05/24/2014  . CAFL (chronic airflow limitation) ([place]) 05/24/2014  . Vitamin B12 deficiency 05/24/2014  . H/O disease 05/24/2014  . H/O: hypothyroidism 05/24/2014  . H/O gastrointestinal disease 05/24/2014  . [place] anemia 02/16/2014  . Anxiety 11/06/2013  . A-fib ([place]) 11/06/2013  . Clinical depression 11/06/2013  . Acid reflux 11/06/2013  .  Heart valve disease 11/06/2013  . Primary cancer of right female breast ([place]) 11/06/2013  . HLD (hyperlipidemia) 11/06/2013  . BP (high blood pressure) 11/06/2013  . Atrial fibrillation ([place]) 11/06/2013  . Chronic obstructive pulmonary disease ([place]) 11/06/2013  . Hormone receptor positive malignant neoplasm of breast ([place]) 11/06/2013  . Adult hypothyroidism 06/29/2010  . Adult BMI 30+ 06/29/2010  . B-complex deficiency 12/05/1995  . Extremity pain 12/05/1995   Harriet Masson, SPT This entire session was performed under direct supervision and direction of a licensed therapist/therapist assistant . I have personally read, edited and approve of the note as written.  Trotter,Margaret PT, DPT 08/14/2017, 1:29 PM  [place] MAIN [facility] SERVICES [address] [place], [place], [address] Phone: [phone]   Fax:  [phone]  Name: Tonya Walton MRN: [id number] Date of Birth: 1950-09-21

## 2017-08-16 ENCOUNTER — Encounter: Payer: Self-pay | Admitting: Physical Therapy

## 2017-08-16 ENCOUNTER — Ambulatory Visit: Payer: PPO | Admitting: Physical Therapy

## 2017-08-16 DIAGNOSIS — R2689 Other abnormalities of gait and mobility: Secondary | ICD-10-CM

## 2017-08-16 DIAGNOSIS — Z9181 History of falling: Secondary | ICD-10-CM

## 2017-08-16 DIAGNOSIS — M6281 Muscle weakness (generalized): Secondary | ICD-10-CM

## 2017-08-16 NOTE — Therapy (Addendum)
[place] MAIN [facility] SERVICES [address] [place], [place], [address] Phone: [phone]   Fax:  [phone]  Physical Therapy Treatment  Patient Details  Name: Tonya Walton MRN: [id number] Date of Birth: Feb 14, 1950 Referring Provider: Dr. Manuella Ghazi   Encounter Date: 08/16/2017  PT End of Session - 08/16/17 0801    Visit Number  3    Number of Visits  17    Date for PT Re-Evaluation  09/10/17    Authorization Type  progress note 3/10    PT Start Time  0800    PT Stop Time  0845    PT Time Calculation (min)  45 min    Equipment Utilized During Treatment  Gait belt    Activity Tolerance  Patient tolerated treatment well    Behavior During Therapy  [facility] for tasks assessed/performed       Past Medical History:  Diagnosis Date  . Anxiety   . Arthritis   . Atrial fibrillation, controlled ([place])   . Breast cancer ([place]) 2012   RT LUMPECTOMY  . CHF (congestive heart failure) ([place])   . COPD (chronic obstructive pulmonary disease) ([place])   . Coronary artery disease   . Depression   . Dysrhythmia    Afib  . GERD (gastroesophageal reflux disease)   . Headache   . Hypertension   . Hypothyroidism   . Neuromuscular disorder ([place])    nerve damage in legs  . Personal history of radiation therapy 2012   BREAST CA  . Restless leg syndrome   . Seizures ([place])   . Shortness of breath dyspnea     Past Surgical History:  Procedure Laterality Date  . APPENDECTOMY    . BREAST BIOPSY Left 07/2013   NEG  . BREAST BIOPSY Right 08/22/2016   US guided breast biopsy  . BREAST EXCISIONAL BIOPSY Right 2012   POS  . BREAST LUMPECTOMY     Right breast  . CARDIAC CATHETERIZATION N/A 03/02/2015   Procedure: Left Heart Cath and Coronary Angiography;  Surgeon: Teodoro Spray, MD;  Location: [place] CV [facility];  Service: Cardiovascular;  Laterality: N/A;  . CATARACT EXTRACTION W/PHACO Right 10/14/2014   Procedure: CATARACT EXTRACTION PHACO AND INTRAOCULAR  LENS PLACEMENT (IOC);  Surgeon: Leandrew Koyanagi, MD;  Location: [place];  Service: Ophthalmology;  Laterality: Right;  . CORONARY ARTERY BYPASS GRAFT N/A 04/01/2015   Procedure: CORONARY ARTERY BYPASS GRAFTING (CABG) x one , using left mammary artery;  Surgeon: Ivin Poot, MD;  Location: [place];  Service: Open Heart Surgery;  Laterality: N/A;  . JOINT REPLACEMENT Right    Total Knee Replacement  . TEE WITHOUT CARDIOVERSION N/A 04/01/2015   Procedure: TRANSESOPHAGEAL ECHOCARDIOGRAM (TEE);  Surgeon: Ivin Poot, MD;  Location: [place];  Service: Open Heart Surgery;  Laterality: N/A;  . TONSILLECTOMY    . TOTAL KNEE ARTHROPLASTY Right 06/10/2014   Procedure: TOTAL KNEE ARTHROPLASTY;  Surgeon: Dereck Leep, MD;  Location: [facility];  Service: Orthopedics;  Laterality: Right;  . TOTAL KNEE ARTHROPLASTY Left 09/02/2014   Procedure: TOTAL KNEE ARTHROPLASTY;  Surgeon: Dereck Leep, MD;  Location: [facility];  Service: Orthopedics;  Laterality: Left;    There were no vitals filed for this visit.  Subjective Assessment - 08/16/17 0803    Subjective  Patient states she is going to the doctor tomorrow to have monitoring device placed to monitor brain activity from Friday through Monday. Pt denies any new falls.  Pertinent History  67 y.o. female referred to physical therapy for imbalance. PMH includes arthritis, A. fib, CHF, COPD, GERD, hypertension, and breast cancer with radiation. Peripheral neuropathy due to vitamin B deficiency with related imbalance. MD recommended cane on regular basis for ambulation. Pt reports 10 seizures in the last 2 and a half months and roughly 10 falls in the last 2 months. Pt reports staggering with ambulation and general loss of balance in prolonged standing. Pt reports feeling weaker on L side; supported by objective strength findings.     Limitations  Walking;Standing    How long can you sit comfortably?  NA but sometimes loses balance when sitting     How long can you stand comfortably?  loses balance after about 5 min    How long can you walk comfortably?  imbalance often immediately; staggers     Patient Stated Goals  Help with the staggering    Currently in Pain?  Yes    Pain Score  2     Pain Location  Leg    Pain Orientation  Right;Left    Pain Descriptors / Indicators  Aching;Burning    Pain Type  Chronic pain    Pain Onset  More than a month ago    Pain Frequency  Constant    Aggravating Factors   sitting or standing too long    Pain Relieving Factors  laying supine    Effect of Pain on Daily Activities  does not like to go out when really bad and does limited household chores    Multiple Pain Sites  No       Treatment Warm-up on Octane cross-trainer level 3 x4 min (unbilled)  1/2 foam roll flat side up in // bars: AP rocking x2 min bout with LUE support to fingertips on // bars, CGA-supervision for safety, VCs for motion at ankles rather than hips to work on ankle strengthening and using ankle strategy to prevent LOB Standing, balanced in center, with no UE support, CGA for safety, VCs to utilize ankle strategy to maintain center and balance Tandem stance with LUE support fingertips on // bar, x1 min holds bilaterally, CGA-min A for maintaining balance, VCs for maintaining upright posture and gaining confidence to decrease UE support  Tandem stance on ground x1 min holds bilaterally with CGA-supervision without UE support, improved balance and use of ankle strategy   Airex pad: Standing on airex pad, eyes closed, no UE support x10 sec bouts x2 bouts, CGA-min A for safety; VCs for utilizing ankles and hips to maintain standing balance Toe taps onto 3 stepping stones, x2 sets each leg with UE support, x2 sets each leg without UE support, CGA required for safety, VCs for sequencing and which color to tap, displays more difficulty with SLS on RLE Forward lunges onto airex pad x10 reps bilaterally, CGA, VCs for proper technique  to work on strengthening as well as single leg stance time Mini squats x10 reps with BUE support, CGA for safety, VCs for sitting bottom back like sitting in a chair and proper technique  Gait in hallway on carpet x160 ft, supervision, no LOB or staggering noted; pt demonstrated increased control and better mobility following therapy session                     PT Education - 08/16/17 0800    Education Details  balance, LE strengthening    Person(s) Educated  Patient    Methods  Explanation;Demonstration;Verbal cues  Comprehension  Need further instruction;Verbalized understanding;Returned demonstration;Verbal cues required       PT Short Term Goals - 07/16/17 1020      PT SHORT TERM GOAL #1   Title  Pt will increase LLE strength to 4/5 to improve functional strength for independent gait, increased standing tolerance and increased ADL ability.    Time  4    Period  Weeks    Status  New    Target Date  08/13/17      PT SHORT TERM GOAL #2   Title  Pt will decrease 5 times sit-to-stand time to < 14 sec to demonstrate decreased fall risk and improved functional mobility.     Time  4    Period  Weeks    Status  New    Target Date  08/13/17      PT SHORT TERM GOAL #3   Title  Patient will report no new falls (except those related to seizures) in the last month to exhibit improved safety awareness and improved balance with ADLs.     Time  4    Period  Weeks    Status  New    Target Date  08/13/17        PT Long Term Goals - 07/16/17 1030      PT LONG TERM GOAL #1   Title  Patient will be independent in home exercise program to improve strength/mobility for better functional independence with ADLs.    Time  8    Period  Weeks    Status  New    Target Date  09/10/17      PT LONG TERM GOAL #2   Title  Pt will improve Berg Balance Assessment score by 4 points to decrease fall risk in home and community environment.     Baseline  07/16/17: 42/56 (significant  fall risk)    Time  8    Period  Weeks    Status  New    Target Date  09/10/17      PT LONG TERM GOAL #3   Title  Patient will increase 10 meter walk test to >1.14m/s as to improve gait speed for better community ambulation and to reduce fall risk.    Baseline  07/16/17: 0.78 m/s    Time  8    Period  Weeks    Status  New    Target Date  09/10/17      PT LONG TERM GOAL #4   Title  Patient will increase BLE gross strength to 4+/5 as to improve functional strength for independent gait, increased standing tolerance and increased ADL ability.     Time  8    Period  Weeks    Status  New    Target Date  09/10/17            Plan - 08/16/17 0802    Clinical Impression Statement  Patient tolerated therapy session well. Pt performed dynamic balance activities with CGA-min A for safety. Pt required VCs for proper technique and education on purpose of each activity. Pt demonstrated improved stance control in dynamic activities today compared to last session and reports feeling more control when walking following session today. Pt performed LE strengthening with forward lunges onto airex pad and mini squats to improve LE strength to improve balance as well. Pt will continue to benefit from skilled PT for improvements in balance, strength, and gait safety.    Rehab Potential  Fair  Clinical Impairments Affecting Rehab Potential  (-) seizures, high frequency of falls, co-morbidities (+) motivated, family support    PT Frequency  2x / week    PT Duration  8 weeks    PT Treatment/Interventions  Cryotherapy;Moist Heat;Gait training;Stair training;Functional mobility training;Therapeutic activities;Therapeutic exercise;Balance training;Neuromuscular re-education;Patient/family education;Visual/perceptual remediation/compensation    PT Next Visit Plan  Assess dynamic balance (DGI, FGA); progress HEP; work on static and dynamic balance    PT Home Exercise Plan  single limb stance, tandem stance      Consulted and Agree with Plan of Care  Patient       Patient will benefit from skilled therapeutic intervention in order to improve the following deficits and impairments:  Abnormal gait, Decreased activity tolerance, Decreased balance, Decreased coordination, Decreased endurance, Decreased mobility, Decreased safety awareness, Difficulty walking, Decreased strength, Dizziness, Impaired vision/preception, Postural dysfunction  Visit Diagnosis: Other abnormalities of gait and mobility  Muscle weakness (generalized)  History of falling     Problem List Patient Active Problem List   Diagnosis Date Noted  . Seizure disorder ([place]) 06/12/2017  . Syncope 05/25/2017  . Medicare annual wellness visit, initial 02/24/2016  . Chest pain 04/01/2015  . CAD in native artery 03/10/2015  . Unstable angina pectoris ([place]) 02/23/2015  . Benign essential HTN 10/20/2014  . Chronic anxiety 10/20/2014  . Anxiety, generalized 10/20/2014  . Insomnia, persistent 10/20/2014  . Recurrent major depressive episodes ([place]) 10/20/2014  . Depression, major, single episode, severe ([place]) 10/20/2014  . Combined fat and carbohydrate induced hyperlipemia 10/20/2014  . Restless leg syndrome 10/20/2014  . MDD (major depressive disorder), recurrent episode, moderate ([employer]) 10/20/2014  . AF (paroxysmal atrial fibrillation) ([place]) 07/31/2014  . Paroxysmal atrial fibrillation ([place]) 07/31/2014  . H/O total knee replacement 06/25/2014  . Total knee replacement status 06/10/2014  . H/O malignant neoplasm of breast 05/24/2014  . Restless leg 05/24/2014  . H/O: HTN (hypertension) 05/24/2014  . History of atrial fibrillation 05/24/2014  . CAFL (chronic airflow limitation) ([place]) 05/24/2014  . Vitamin B12 deficiency 05/24/2014  . H/O disease 05/24/2014  . H/O: hypothyroidism 05/24/2014  . H/O gastrointestinal disease 05/24/2014  . [place]ison anemia 02/16/2014  . Anxiety 11/06/2013  . A-fib ([place]) 11/06/2013  . Clinical  depression 11/06/2013  . Acid reflux 11/06/2013  . Heart valve disease 11/06/2013  . Primary cancer of right female breast ([place]) 11/06/2013  . HLD (hyperlipidemia) 11/06/2013  . BP (high blood pressure) 11/06/2013  . Atrial fibrillation ([place]) 11/06/2013  . Chronic obstructive pulmonary disease ([place]) 11/06/2013  . Hormone receptor positive malignant neoplasm of breast ([place]) 11/06/2013  . Adult hypothyroidism 06/29/2010  . Adult BMI 30+ 06/29/2010  . B-complex deficiency 12/05/1995  . Extremity pain 12/05/1995   Harriet Masson, SPT This entire session was performed under direct supervision and direction of a licensed therapist/therapist assistant . I have personally read, edited and approve of the note as written.  Trotter,Margaret PT, DPT 08/16/2017, 11:51 AM  [place] MAIN [facility] SERVICES [address] [place], [place], [address] Phone: [phone]   Fax:  [phone]  Name: Tonya Walton MRN: [id number] Date of Birth: 28-Nov-1950

## 2017-08-17 DIAGNOSIS — G40909 Epilepsy, unspecified, not intractable, without status epilepticus: Secondary | ICD-10-CM | POA: Diagnosis not present

## 2017-08-18 DIAGNOSIS — G40909 Epilepsy, unspecified, not intractable, without status epilepticus: Secondary | ICD-10-CM | POA: Diagnosis not present

## 2017-08-19 DIAGNOSIS — G40909 Epilepsy, unspecified, not intractable, without status epilepticus: Secondary | ICD-10-CM | POA: Diagnosis not present

## 2017-08-20 ENCOUNTER — Ambulatory Visit: Payer: PPO | Admitting: Physical Therapy

## 2017-08-21 ENCOUNTER — Telehealth: Payer: Self-pay | Admitting: *Deleted

## 2017-08-21 DIAGNOSIS — R739 Hyperglycemia, unspecified: Secondary | ICD-10-CM | POA: Diagnosis not present

## 2017-08-21 DIAGNOSIS — D510 Vitamin B12 deficiency anemia due to intrinsic factor deficiency: Secondary | ICD-10-CM | POA: Diagnosis not present

## 2017-08-21 DIAGNOSIS — J431 Panlobular emphysema: Secondary | ICD-10-CM | POA: Diagnosis not present

## 2017-08-21 DIAGNOSIS — E782 Mixed hyperlipidemia: Secondary | ICD-10-CM | POA: Diagnosis not present

## 2017-08-21 NOTE — Telephone Encounter (Signed)
I truly do not know.  Last mammo was normal.

## 2017-08-21 NOTE — Telephone Encounter (Signed)
Patient informed of doctor response and reiterated that she is not having problems/ pain.

## 2017-08-21 NOTE — Telephone Encounter (Signed)
Patient called to inquire if it is normal for her breast she had cancer in to shrink. She had right breast cancer in 2012 and completed radiation therapy and 5 years of anastrozole. Please advise

## 2017-08-22 ENCOUNTER — Ambulatory Visit: Payer: PPO | Admitting: Physical Therapy

## 2017-08-22 ENCOUNTER — Encounter: Payer: Self-pay | Admitting: Physical Therapy

## 2017-08-22 DIAGNOSIS — M6281 Muscle weakness (generalized): Secondary | ICD-10-CM

## 2017-08-22 DIAGNOSIS — Z9181 History of falling: Secondary | ICD-10-CM

## 2017-08-22 DIAGNOSIS — R2689 Other abnormalities of gait and mobility: Secondary | ICD-10-CM

## 2017-08-22 NOTE — Patient Instructions (Signed)
Access Code: [email or web address]  URL: [email or web address]  Date: 08/22/2017  Prepared by: Blanche East   Exercises  Standing March with Counter Support - 10 reps - 2 sets - 2x daily - 7x weekly  Standing Hip Extension with Counter Support - 10 reps - 2 sets - 2x daily - 7x weekly  Standing Hip Abduction with Counter Support - 10 reps - 2 sets - 2x daily - 7x weekly

## 2017-08-22 NOTE — Therapy (Addendum)
[place] MAIN [facility] SERVICES [address] [place], [place], [address] Phone: [phone]   Fax:  [phone]  Physical Therapy Treatment  Patient Details  Name: Tonya Walton MRN: [id number] Date of Birth: 09-15-1950 Referring Provider: Dr. Manuella Ghazi   Encounter Date: 08/22/2017  PT End of Session - 08/22/17 0806    Visit Number  4    Number of Visits  17    Date for PT Re-Evaluation  09/10/17    Authorization Type  progress note 4/10    PT Start Time  0805    PT Stop Time  0845    PT Time Calculation (min)  40 min    Equipment Utilized During Treatment  Gait belt    Activity Tolerance  Patient tolerated treatment well    Behavior During Therapy  [facility] for tasks assessed/performed       Past Medical History:  Diagnosis Date  . Anxiety   . Arthritis   . Atrial fibrillation, controlled ([place])   . Breast cancer ([place]) 2012   RT LUMPECTOMY  . CHF (congestive heart failure) ([place])   . COPD (chronic obstructive pulmonary disease) ([place])   . Coronary artery disease   . Depression   . Dysrhythmia    Afib  . GERD (gastroesophageal reflux disease)   . Headache   . Hypertension   . Hypothyroidism   . Neuromuscular disorder ([place])    nerve damage in legs  . Personal history of radiation therapy 2012   BREAST CA  . Restless leg syndrome   . Seizures ([place])   . Shortness of breath dyspnea     Past Surgical History:  Procedure Laterality Date  . APPENDECTOMY    . BREAST BIOPSY Left 07/2013   NEG  . BREAST BIOPSY Right 08/22/2016   US guided breast biopsy  . BREAST EXCISIONAL BIOPSY Right 2012   POS  . BREAST LUMPECTOMY     Right breast  . CARDIAC CATHETERIZATION N/A 03/02/2015   Procedure: Left Heart Cath and Coronary Angiography;  Surgeon: Teodoro Spray, MD;  Location: [facility];  Service: Cardiovascular;  Laterality: N/A;  . CATARACT EXTRACTION W/PHACO Right 10/14/2014   Procedure: CATARACT EXTRACTION PHACO AND INTRAOCULAR  LENS PLACEMENT (IOC);  Surgeon: Leandrew Koyanagi, MD;  Location: [facility];  Service: Ophthalmology;  Laterality: Right;  . CORONARY ARTERY BYPASS GRAFT N/A 04/01/2015   Procedure: CORONARY ARTERY BYPASS GRAFTING (CABG) x one , using left mammary artery;  Surgeon: Ivin Poot, MD;  Location: [facility];  Service: Open Heart Surgery;  Laterality: N/A;  . JOINT REPLACEMENT Right    Total Knee Replacement  . TEE WITHOUT CARDIOVERSION N/A 04/01/2015   Procedure: TRANSESOPHAGEAL ECHOCARDIOGRAM (TEE);  Surgeon: Ivin Poot, MD;  Location: [facility];  Service: Open Heart Surgery;  Laterality: N/A;  . TONSILLECTOMY    . TOTAL KNEE ARTHROPLASTY Right 06/10/2014   Procedure: TOTAL KNEE ARTHROPLASTY;  Surgeon: Dereck Leep, MD;  Location: [facility];  Service: Orthopedics;  Laterality: Right;  . TOTAL KNEE ARTHROPLASTY Left 09/02/2014   Procedure: TOTAL KNEE ARTHROPLASTY;  Surgeon: Dereck Leep, MD;  Location: [facility];  Service: Orthopedics;  Laterality: Left;    There were no vitals filed for this visit.  Subjective Assessment - 08/22/17 0805    Subjective  Patient states she had a good weekend but was uncomfortable with brain monitoring system. Patient denies any new falls since last visit.     Pertinent History  67 y.o. female referred to physical therapy for imbalance. PMH includes arthritis, A. fib, CHF, COPD, GERD, hypertension, and breast cancer with radiation. Peripheral neuropathy due to vitamin B deficiency with related imbalance. MD recommended cane on regular basis for ambulation. Pt reports 10 seizures in the last 2 and a half months and roughly 10 falls in the last 2 months. Pt reports staggering with ambulation and general loss of balance in prolonged standing. Pt reports feeling weaker on L side; supported by objective strength findings.     Limitations  Walking;Standing    How long can you sit comfortably?  NA but sometimes loses balance when sitting    How long can you stand  comfortably?  loses balance after about 5 min    How long can you walk comfortably?  imbalance often immediately; staggers     Patient Stated Goals  Help with the staggering    Currently in Pain?  Yes    Pain Score  2     Pain Location  Leg    Pain Orientation  Right;Left    Pain Descriptors / Indicators  Aching;Burning    Pain Type  Chronic pain    Pain Onset  More than a month ago       Treatment Warm-up on Octane cross-trainer level 4 x4 min (unbilled)  1/2 foam roll flat side up in // bars: AP rocking x2 min bout with LUE support to fingertips on // bars, CGA-supervision for safety, VCs for motion at ankles rather than hips to work on ankle strengthening and using ankle strategy to prevent LOB Standing, balanced in center x2 min hold, with no UE support, CGA for safety, VCs to utilize ankle strategy to maintain center and balance Tandem stance with LUE support fingertips on // bar, x1 min holds bilaterally, CGA-min A for maintaining balance, VCs for maintaining upright posture and gaining confidence to decrease UE support  Airex pad: Toe taps onto 3 cones, x2 sets each leg with UE support, CGA required for safety, VCs for sequencing and which color to tap, displays more difficulty with SLS on RLE  Strengthening: Standing, alternating marching with green tband above knees x15 reps bilaterally, VCs for lifting knee up high when marching to really strengthen hip flexors  Hip extension with green tband above knees x15 reps bilaterally, VCs for keeping knee straight when kicking back Hip abduction with green tband above knees x15 reps bilaterally -VCs required for proper technique and positioning for all standing therex               PT Education - 08/22/17 0806    Education Details  balance, LE strengthening    Person(s) Educated  Patient    Methods  Explanation;Demonstration;Verbal cues    Comprehension  Verbalized understanding;Returned demonstration;Verbal cues  required;Need further instruction       PT Short Term Goals - 07/16/17 1020      PT SHORT TERM GOAL #1   Title  Pt will increase LLE strength to 4/5 to improve functional strength for independent gait, increased standing tolerance and increased ADL ability.    Time  4    Period  Weeks    Status  New    Target Date  08/13/17      PT SHORT TERM GOAL #2   Title  Pt will decrease 5 times sit-to-stand time to < 14 sec to demonstrate decreased fall risk and improved functional mobility.     Time  4  Period  Weeks    Status  New    Target Date  08/13/17      PT SHORT TERM GOAL #3   Title  Patient will report no new falls (except those related to seizures) in the last month to exhibit improved safety awareness and improved balance with ADLs.     Time  4    Period  Weeks    Status  New    Target Date  08/13/17        PT Long Term Goals - 07/16/17 1030      PT LONG TERM GOAL #1   Title  Patient will be independent in home exercise program to improve strength/mobility for better functional independence with ADLs.    Time  8    Period  Weeks    Status  New    Target Date  09/10/17      PT LONG TERM GOAL #2   Title  Pt will improve Berg Balance Assessment score by 4 points to decrease fall risk in home and community environment.     Baseline  07/16/17: 42/56 (significant fall risk)    Time  8    Period  Weeks    Status  New    Target Date  09/10/17      PT LONG TERM GOAL #3   Title  Patient will increase 10 meter walk test to >1.43m/s as to improve gait speed for better community ambulation and to reduce fall risk.    Baseline  07/16/17: 0.78 m/s    Time  8    Period  Weeks    Status  New    Target Date  09/10/17      PT LONG TERM GOAL #4   Title  Patient will increase BLE gross strength to 4+/5 as to improve functional strength for independent gait, increased standing tolerance and increased ADL ability.     Time  8    Period  Weeks    Status  New    Target Date   09/10/17            Plan - 08/22/17 0806    Clinical Impression Statement  Patient tolerated therapy session well. Pt performed dynamic postural control activities with CGA-min A for stability and prevent LOB when standing on compliant surfaces. Pt required VCs for proper technique with balance activities; encouraging to establish balance prior to reducing UE support. Pt performed LE strengthening in standing with focus on hip flexors, extensors, and abductors using green tband for resistance above the knees; required supervision and VCs for proper technique and sequencing. Pt demonstrated improved stability with walking following therapy session. Pt will continue to benefit from skilled PT for improvements in balance, strength, and gait safety.    Rehab Potential  Fair    Clinical Impairments Affecting Rehab Potential  (-) seizures, high frequency of falls, co-morbidities (+) motivated, family support    PT Frequency  2x / week    PT Duration  8 weeks    PT Treatment/Interventions  Cryotherapy;Moist Heat;Gait training;Stair training;Functional mobility training;Therapeutic activities;Therapeutic exercise;Balance training;Neuromuscular re-education;Patient/family education;Visual/perceptual remediation/compensation    PT Next Visit Plan  Assess dynamic balance (DGI, FGA); progress HEP; work on static and dynamic balance    PT Home Exercise Plan  single limb stance, tandem stance     Consulted and Agree with Plan of Care  Patient       Patient will benefit from skilled therapeutic intervention in order to improve  the following deficits and impairments:  Abnormal gait, Decreased activity tolerance, Decreased balance, Decreased coordination, Decreased endurance, Decreased mobility, Decreased safety awareness, Difficulty walking, Decreased strength, Dizziness, Impaired vision/preception, Postural dysfunction  Visit Diagnosis: Other abnormalities of gait and mobility  Muscle weakness  (generalized)  History of falling     Problem List Patient Active Problem List   Diagnosis Date Noted  . Seizure disorder ([place]) 06/12/2017  . Syncope 05/25/2017  . Medicare annual wellness visit, initial 02/24/2016  . Chest pain 04/01/2015  . CAD in native artery 03/10/2015  . Unstable angina pectoris ([place]) 02/23/2015  . Benign essential HTN 10/20/2014  . Chronic anxiety 10/20/2014  . Anxiety, generalized 10/20/2014  . Insomnia, persistent 10/20/2014  . Recurrent major depressive episodes ([place]) 10/20/2014  . Depression, major, single episode, severe ([place]) 10/20/2014  . Combined fat and carbohydrate induced hyperlipemia 10/20/2014  . Restless leg syndrome 10/20/2014  . MDD (major depressive disorder), recurrent episode, moderate ([place]) 10/20/2014  . AF (paroxysmal atrial fibrillation) ([place]) 07/31/2014  . Paroxysmal atrial fibrillation ([place]) 07/31/2014  . H/O total knee replacement 06/25/2014  . Total knee replacement status 06/10/2014  . H/O malignant neoplasm of breast 05/24/2014  . Restless leg 05/24/2014  . H/O: HTN (hypertension) 05/24/2014  . History of atrial fibrillation 05/24/2014  . CAFL (chronic airflow limitation) ([place]) 05/24/2014  . Vitamin B12 deficiency 05/24/2014  . H/O disease 05/24/2014  . H/O: hypothyroidism 05/24/2014  . H/O gastrointestinal disease 05/24/2014  . [place] anemia 02/16/2014  . Anxiety 11/06/2013  . A-fib ([place]) 11/06/2013  . Clinical depression 11/06/2013  . Acid reflux 11/06/2013  . Heart valve disease 11/06/2013  . Primary cancer of right female breast ([place]) 11/06/2013  . HLD (hyperlipidemia) 11/06/2013  . BP (high blood pressure) 11/06/2013  . Atrial fibrillation ([place]) 11/06/2013  . Chronic obstructive pulmonary disease ([place]) 11/06/2013  . Hormone receptor positive malignant neoplasm of breast ([place]) 11/06/2013  . Adult hypothyroidism 06/29/2010  . Adult BMI 30+ 06/29/2010  . B-complex deficiency 12/05/1995  . Extremity pain  12/05/1995   Harriet Masson, SPT This entire session was performed under direct supervision and direction of a licensed therapist/therapist assistant . I have personally read, edited and approve of the note as written.  Trotter,Margaret PT, DPT 08/22/2017, 9:09 AM  [place] MAIN [facility] SERVICES [address] [place], [place], [address] Phone: [phone]   Fax:  [phone]  Name: Tonya Walton MRN: [id number] Date of Birth: Aug 01, 1950

## 2017-08-27 ENCOUNTER — Ambulatory Visit: Payer: PPO | Admitting: Physical Therapy

## 2017-08-27 ENCOUNTER — Encounter: Payer: Self-pay | Admitting: Physical Therapy

## 2017-08-27 DIAGNOSIS — R2689 Other abnormalities of gait and mobility: Secondary | ICD-10-CM | POA: Diagnosis not present

## 2017-08-27 DIAGNOSIS — Z9181 History of falling: Secondary | ICD-10-CM

## 2017-08-27 DIAGNOSIS — M6281 Muscle weakness (generalized): Secondary | ICD-10-CM

## 2017-08-27 NOTE — Therapy (Addendum)
[place] MAIN [facility] SERVICES [address] [place], [place], [address] Phone: [phone]   Fax:  [phone]  Physical Therapy Treatment  Patient Details  Name: Tonya Walton MRN: [id number] Date of Birth: 1950/12/07 Referring Provider: Dr. Manuella Ghazi   Encounter Date: 08/27/2017  PT End of Session - 08/27/17 0759    Visit Number  5    Number of Visits  17    Date for PT Re-Evaluation  09/10/17    Authorization Type  progress note 5/10    PT Start Time  0800    PT Stop Time  0845    PT Time Calculation (min)  45 min    Equipment Utilized During Treatment  Gait belt    Activity Tolerance  Patient tolerated treatment well    Behavior During Therapy  [facility] for tasks assessed/performed       Past Medical History:  Diagnosis Date  . Anxiety   . Arthritis   . Atrial fibrillation, controlled ([place])   . Breast cancer ([place]) 2012   RT LUMPECTOMY  . CHF (congestive heart failure) ([place])   . COPD (chronic obstructive pulmonary disease) ([place])   . Coronary artery disease   . Depression   . Dysrhythmia    Afib  . GERD (gastroesophageal reflux disease)   . Headache   . Hypertension   . Hypothyroidism   . Neuromuscular disorder ([place])    nerve damage in legs  . Personal history of radiation therapy 2012   BREAST CA  . Restless leg syndrome   . Seizures ([place])   . Shortness of breath dyspnea     Past Surgical History:  Procedure Laterality Date  . APPENDECTOMY    . BREAST BIOPSY Left 07/2013   NEG  . BREAST BIOPSY Right 08/22/2016   US guided breast biopsy  . BREAST EXCISIONAL BIOPSY Right 2012   POS  . BREAST LUMPECTOMY     Right breast  . CARDIAC CATHETERIZATION N/A 03/02/2015   Procedure: Left Heart Cath and Coronary Angiography;  Surgeon: Teodoro Spray, MD;  Location: [facility];  Service: Cardiovascular;  Laterality: N/A;  . CATARACT EXTRACTION W/PHACO Right 10/14/2014   Procedure: CATARACT EXTRACTION PHACO AND INTRAOCULAR  LENS PLACEMENT (IOC);  Surgeon: Leandrew Koyanagi, MD;  Location: [facility];  Service: Ophthalmology;  Laterality: Right;  . CORONARY ARTERY BYPASS GRAFT N/A 04/01/2015   Procedure: CORONARY ARTERY BYPASS GRAFTING (CABG) x one , using left mammary artery;  Surgeon: Ivin Poot, MD;  Location: [facility];  Service: Open Heart Surgery;  Laterality: N/A;  . JOINT REPLACEMENT Right    Total Knee Replacement  . TEE WITHOUT CARDIOVERSION N/A 04/01/2015   Procedure: TRANSESOPHAGEAL ECHOCARDIOGRAM (TEE);  Surgeon: Ivin Poot, MD;  Location: [facility];  Service: Open Heart Surgery;  Laterality: N/A;  . TONSILLECTOMY    . TOTAL KNEE ARTHROPLASTY Right 06/10/2014   Procedure: TOTAL KNEE ARTHROPLASTY;  Surgeon: Dereck Leep, MD;  Location: [facility];  Service: Orthopedics;  Laterality: Right;  . TOTAL KNEE ARTHROPLASTY Left 09/02/2014   Procedure: TOTAL KNEE ARTHROPLASTY;  Surgeon: Dereck Leep, MD;  Location: [facility];  Service: Orthopedics;  Laterality: Left;    There were no vitals filed for this visit.  Subjective Assessment - 08/27/17 0800    Subjective  Patient states she had a good weekend and visited a friend this weekend; reports has a busy week ahead. Patient denies any new falls since last visit; reports  staggering is better but still present at times.     Pertinent History  67 y.o. female referred to physical therapy for imbalance. PMH includes arthritis, A. fib, CHF, COPD, GERD, hypertension, and breast cancer with radiation. Peripheral neuropathy due to vitamin B deficiency with related imbalance. MD recommended cane on regular basis for ambulation. Pt reports 10 seizures in the last 2 and a half months and roughly 10 falls in the last 2 months. Pt reports staggering with ambulation and general loss of balance in prolonged standing. Pt reports feeling weaker on L side; supported by objective strength findings.     Limitations  Walking;Standing    How long can you sit comfortably?  NA  but sometimes loses balance when sitting    How long can you stand comfortably?  loses balance after about 5 min    How long can you walk comfortably?  imbalance often immediately; staggers     Patient Stated Goals  Help with the staggering    Currently in Pain?  Yes    Pain Score  2     Pain Location  Leg    Pain Orientation  Right;Left    Pain Descriptors / Indicators  Aching;Burning    Pain Type  Chronic pain    Pain Onset  More than a month ago       Treatment Warm-up on Octane cross trainer level 4 x4 min (unbilled)  Stepping over foam 1/2 roll: Forward/backwards x10 reps with CGA-supervision, faded UE support, VCs for lifting the foot all the way over the bolster Side-to-side x10 reps with CGA-supervision, faded UE support, VCs for clearing the bolster and stepping straight over the bolster  Tandem stance with flat side down x1 min holds with faded UE support with each foot in rear, CGA-supervision, VCs to utilize ankles to maintain balance  Weaving between 6 cones forwards walking x1 lap with CGA for safety, VCs for turning all the way between the cones Side stepping over 6 cones both directions x1 lap each with CGA for safety, VCs for not swinging foot around cone but stepping straight over the cone Weaving between 6 cones with alternating toe taps on each cone x1 lap, CGA, VCs for controlling tap onto cone and bringing foot back down between turns between cones Weaving between 6 cones while picking up each cone x1 pass, CGA for safety, VCs for completing turn prior to picking up the cone  Gait in hallway on carpet: Horizontal head turns x2 laps Vertical head turns x2 laps Quick directional changes, forward and backward x2 laps  CGA for all gait activities for safety with VCs for appropriate weight shift and looking for a target while performing head turns             PT Education - 08/27/17 0759    Education Details  balance, LE strengthening    Person(s)  Educated  Patient    Methods  Explanation;Demonstration;Verbal cues    Comprehension  Verbalized understanding;Returned demonstration;Verbal cues required;Need further instruction       PT Short Term Goals - 07/16/17 1020      PT SHORT TERM GOAL #1   Title  Pt will increase LLE strength to 4/5 to improve functional strength for independent gait, increased standing tolerance and increased ADL ability.    Time  4    Period  Weeks    Status  New    Target Date  08/13/17      PT SHORT TERM GOAL #2  Title  Pt will decrease 5 times sit-to-stand time to < 14 sec to demonstrate decreased fall risk and improved functional mobility.     Time  4    Period  Weeks    Status  New    Target Date  08/13/17      PT SHORT TERM GOAL #3   Title  Patient will report no new falls (except those related to seizures) in the last month to exhibit improved safety awareness and improved balance with ADLs.     Time  4    Period  Weeks    Status  New    Target Date  08/13/17        PT Long Term Goals - 07/16/17 1030      PT LONG TERM GOAL #1   Title  Patient will be independent in home exercise program to improve strength/mobility for better functional independence with ADLs.    Time  8    Period  Weeks    Status  New    Target Date  09/10/17      PT LONG TERM GOAL #2   Title  Pt will improve Berg Balance Assessment score by 4 points to decrease fall risk in home and community environment.     Baseline  07/16/17: 42/56 (significant fall risk)    Time  8    Period  Weeks    Status  New    Target Date  09/10/17      PT LONG TERM GOAL #3   Title  Patient will increase 10 meter walk test to >1.102m/s as to improve gait speed for better community ambulation and to reduce fall risk.    Baseline  07/16/17: 0.78 m/s    Time  8    Period  Weeks    Status  New    Target Date  09/10/17      PT LONG TERM GOAL #4   Title  Patient will increase BLE gross strength to 4+/5 as to improve functional strength  for independent gait, increased standing tolerance and increased ADL ability.     Time  8    Period  Weeks    Status  New    Target Date  09/10/17            Plan - 08/27/17 0800    Clinical Impression Statement  Patient tolerated therapy session well. Pt verbalized feeling more confident in standing balance at home and in every day tasks; feels staggering occurs more during walking. Pt performed dynamic postural control tasks with stepping over bolster and tandem stance on compliant surface. Pt performed gait activities with weaving between cones and walking with head turns and directional changes. Pt required CGA for safety with VCs for appropriate sequencing as well as weight shifting. PT educated pt on not putting all of her weight through heels to prevent feeling like she falling backwards. Pt will continue to benefit from skilled PT for improvements in balance, strength, and gait safety.    Rehab Potential  Fair    Clinical Impairments Affecting Rehab Potential  (-) seizures, high frequency of falls, co-morbidities (+) motivated, family support    PT Frequency  2x / week    PT Duration  8 weeks    PT Treatment/Interventions  Cryotherapy;Moist Heat;Gait training;Stair training;Functional mobility training;Therapeutic activities;Therapeutic exercise;Balance training;Neuromuscular re-education;Patient/family education;Visual/perceptual remediation/compensation    PT Next Visit Plan  Assess dynamic balance (DGI, FGA); progress HEP; work on static and dynamic balance  PT Home Exercise Plan  single limb stance, tandem stance     Consulted and Agree with Plan of Care  Patient       Patient will benefit from skilled therapeutic intervention in order to improve the following deficits and impairments:  Abnormal gait, Decreased activity tolerance, Decreased balance, Decreased coordination, Decreased endurance, Decreased mobility, Decreased safety awareness, Difficulty walking, Decreased  strength, Dizziness, Impaired vision/preception, Postural dysfunction  Visit Diagnosis: Other abnormalities of gait and mobility  Muscle weakness (generalized)  History of falling     Problem List Patient Active Problem List   Diagnosis Date Noted  . Seizure disorder ([place]) 06/12/2017  . Syncope 05/25/2017  . Medicare annual wellness visit, initial 02/24/2016  . Chest pain 04/01/2015  . CAD in native artery 03/10/2015  . Unstable angina pectoris ([place]) 02/23/2015  . Benign essential HTN 10/20/2014  . Chronic anxiety 10/20/2014  . Anxiety, generalized 10/20/2014  . Insomnia, persistent 10/20/2014  . Recurrent major depressive episodes ([place]) 10/20/2014  . Depression, major, single episode, severe ([place]) 10/20/2014  . Combined fat and carbohydrate induced hyperlipemia 10/20/2014  . Restless leg syndrome 10/20/2014  . MDD (major depressive disorder), recurrent episode, moderate ([place]) 10/20/2014  . AF (paroxysmal atrial fibrillation) ([place]) 07/31/2014  . Paroxysmal atrial fibrillation ([place]) 07/31/2014  . H/O total knee replacement 06/25/2014  . Total knee replacement status 06/10/2014  . H/O malignant neoplasm of breast 05/24/2014  . Restless leg 05/24/2014  . H/O: HTN (hypertension) 05/24/2014  . History of atrial fibrillation 05/24/2014  . CAFL (chronic airflow limitation) ([place]) 05/24/2014  . Vitamin B12 deficiency 05/24/2014  . H/O disease 05/24/2014  . H/O: hypothyroidism 05/24/2014  . H/O gastrointestinal disease 05/24/2014  . [place] anemia 02/16/2014  . Anxiety 11/06/2013  . A-fib ([place]) 11/06/2013  . Clinical depression 11/06/2013  . Acid reflux 11/06/2013  . Heart valve disease 11/06/2013  . Primary cancer of right female breast ([place]) 11/06/2013  . HLD (hyperlipidemia) 11/06/2013  . BP (high blood pressure) 11/06/2013  . Atrial fibrillation ([place]) 11/06/2013  . Chronic obstructive pulmonary disease ([place]) 11/06/2013  . Hormone receptor positive malignant neoplasm of  breast ([place]) 11/06/2013  . Adult hypothyroidism 06/29/2010  . Adult BMI 30+ 06/29/2010  . B-complex deficiency 12/05/1995  . Extremity pain 12/05/1995   Harriet Masson, SPT This entire session was performed under direct supervision and direction of a licensed therapist/therapist assistant . I have personally read, edited and approve of the note as written.  Trotter,Margaret PT, DPT 08/27/2017, 11:32 AM  [place] MAIN [facility] SERVICES [address] [place], [place], [address] Phone: [phone]   Fax:  [phone]  Name: ARRIANNA CATALA MRN: [id number] Date of Birth: 1950-08-23

## 2017-08-28 DIAGNOSIS — Z79899 Other long term (current) drug therapy: Secondary | ICD-10-CM | POA: Diagnosis not present

## 2017-08-28 DIAGNOSIS — R739 Hyperglycemia, unspecified: Secondary | ICD-10-CM | POA: Diagnosis not present

## 2017-08-28 DIAGNOSIS — E782 Mixed hyperlipidemia: Secondary | ICD-10-CM | POA: Diagnosis not present

## 2017-08-28 DIAGNOSIS — E538 Deficiency of other specified B group vitamins: Secondary | ICD-10-CM | POA: Diagnosis not present

## 2017-08-28 DIAGNOSIS — J431 Panlobular emphysema: Secondary | ICD-10-CM | POA: Diagnosis not present

## 2017-08-29 ENCOUNTER — Ambulatory Visit: Payer: PPO | Admitting: Physical Therapy

## 2017-08-29 ENCOUNTER — Encounter: Payer: Self-pay | Admitting: Physical Therapy

## 2017-08-29 DIAGNOSIS — Z9181 History of falling: Secondary | ICD-10-CM

## 2017-08-29 DIAGNOSIS — M6281 Muscle weakness (generalized): Secondary | ICD-10-CM

## 2017-08-29 DIAGNOSIS — R2689 Other abnormalities of gait and mobility: Secondary | ICD-10-CM | POA: Diagnosis not present

## 2017-08-29 NOTE — Therapy (Addendum)
[place] MAIN [facility] SERVICES [address] [place], [place], [address] Phone: [phone]   Fax:  [phone]  Physical Therapy Treatment  Patient Details  Name: Tonya Walton MRN: [id number] Date of Birth: 01-04-1951 Referring Provider: Dr. Manuella Ghazi   Encounter Date: 08/29/2017  PT End of Session - 08/29/17 0759    Visit Number  6    Number of Visits  17    Date for PT Re-Evaluation  09/10/17    Authorization Type  progress note 6/10    PT Start Time  0800    PT Stop Time  0845    PT Time Calculation (min)  45 min    Equipment Utilized During Treatment  Gait belt    Activity Tolerance  Patient tolerated treatment well    Behavior During Therapy  [facility] for tasks assessed/performed       Past Medical History:  Diagnosis Date  . Anxiety   . Arthritis   . Atrial fibrillation, controlled ([place])   . Breast cancer ([place]) 2012   RT LUMPECTOMY  . CHF (congestive heart failure) ([place])   . COPD (chronic obstructive pulmonary disease) ([place])   . Coronary artery disease   . Depression   . Dysrhythmia    Afib  . GERD (gastroesophageal reflux disease)   . Headache   . Hypertension   . Hypothyroidism   . Neuromuscular disorder ([place])    nerve damage in legs  . Personal history of radiation therapy 2012   BREAST CA  . Restless leg syndrome   . Seizures ([place])   . Shortness of breath dyspnea     Past Surgical History:  Procedure Laterality Date  . APPENDECTOMY    . BREAST BIOPSY Left 07/2013   NEG  . BREAST BIOPSY Right 08/22/2016   US guided breast biopsy  . BREAST EXCISIONAL BIOPSY Right 2012   POS  . BREAST LUMPECTOMY     Right breast  . CARDIAC CATHETERIZATION N/A 03/02/2015   Procedure: Left Heart Cath and Coronary Angiography;  Surgeon: Teodoro Spray, MD;  Location: [place] [facility];  Service: Cardiovascular;  Laterality: N/A;  . CATARACT EXTRACTION W/PHACO Right 10/14/2014   Procedure: CATARACT EXTRACTION PHACO AND INTRAOCULAR  LENS PLACEMENT (IOC);  Surgeon: Leandrew Koyanagi, MD;  Location: [place];  Service: Ophthalmology;  Laterality: Right;  . CORONARY ARTERY BYPASS GRAFT N/A 04/01/2015   Procedure: CORONARY ARTERY BYPASS GRAFTING (CABG) x one , using left mammary artery;  Surgeon: Ivin Poot, MD;  Location: [place];  Service: Open Heart Surgery;  Laterality: N/A;  . JOINT REPLACEMENT Right    Total Knee Replacement  . TEE WITHOUT CARDIOVERSION N/A 04/01/2015   Procedure: TRANSESOPHAGEAL ECHOCARDIOGRAM (TEE);  Surgeon: Ivin Poot, MD;  Location: [place];  Service: Open Heart Surgery;  Laterality: N/A;  . TONSILLECTOMY    . TOTAL KNEE ARTHROPLASTY Right 06/10/2014   Procedure: TOTAL KNEE ARTHROPLASTY;  Surgeon: Dereck Leep, MD;  Location: [facility];  Service: Orthopedics;  Laterality: Right;  . TOTAL KNEE ARTHROPLASTY Left 09/02/2014   Procedure: TOTAL KNEE ARTHROPLASTY;  Surgeon: Dereck Leep, MD;  Location: [facility];  Service: Orthopedics;  Laterality: Left;    There were no vitals filed for this visit.  Subjective Assessment - 08/29/17 0800    Subjective  Patient reports she is having a good week and doing well; had a visit with her PCP that went well. Pt states her PCP told her bloodwork was  best he had seen for her. Pt denies any falls since last visit and reports she notices increased staggering when she gets in too big of a hurry and is walking. Pt reports no pain today and feels as though PT is helping with her neuropathy as well.    Pertinent History  67 y.o. female referred to physical therapy for imbalance. PMH includes arthritis, A. fib, CHF, COPD, GERD, hypertension, and breast cancer with radiation. Peripheral neuropathy due to vitamin B deficiency with related imbalance. MD recommended cane on regular basis for ambulation. Pt reports 10 seizures in the last 2 and a half months and roughly 10 falls in the last 2 months. Pt reports staggering with ambulation and general loss of balance in  prolonged standing. Pt reports feeling weaker on L side; supported by objective strength findings.     Limitations  Walking;Standing    How long can you sit comfortably?  NA but sometimes loses balance when sitting    How long can you stand comfortably?  loses balance after about 5 min    How long can you walk comfortably?  imbalance often immediately; staggers     Patient Stated Goals  Help with the staggering    Currently in Pain?  No/denies    Pain Onset  More than a month ago        Treatment Warm-up on Octane cross trainer level 4 x4 min (unbilled)   foam roll in // bars: AP rocks x1 min x2 bouts without UE support; CGA for stability with min A for posterior LOB x2  Modified tandem stance with each foot on a dyna disk x30 sec holds each foot in rear with faded UE support; CGA-min A for safety without UE support, VCs for utilizing ankles and hips to maintain balance  Gait in hallway on carpet, CGA for all gait activities: -Horizontal head turns x160 ft -Vertical head turns x160 ft -Quick directional changes forwards and reverse x80 ft -Quick speed changes between fast, normal, and slow speeds x160 ft; more staggering noted with increased speed -[employer] while walking x160 ft, more staggering noted with dual task but no LOB noted  VCs required for increased step length and maintaining gait in a straight line during all bouts  Weaving between 6 cones, CGA throughout: -Forwards walking x1 lap -Side stepping over cones both directions x2 laps, VCs for taking a big enough step to get the second foot over the cone -Weaving with alternating toe taps on cone x1 lap, VCs for controlling the tap to not knock over the cone  -Weaving between cones and picking each up on the way VCs required for proper technique and sequencing with various activities through the cones                       PT Education - 08/29/17 0759    Education Details  balance, LE strengthening     Person(s) Educated  Patient    Methods  Explanation;Demonstration;Verbal cues    Comprehension  Verbalized understanding;Returned demonstration;Verbal cues required;Need further instruction       PT Short Term Goals - 07/16/17 1020      PT SHORT TERM GOAL #1   Title  Pt will increase LLE strength to 4/5 to improve functional strength for independent gait, increased standing tolerance and increased ADL ability.    Time  4    Period  Weeks    Status  New    Target Date  08/13/17      PT SHORT TERM GOAL #2   Title  Pt will decrease 5 times sit-to-stand time to < 14 sec to demonstrate decreased fall risk and improved functional mobility.     Time  4    Period  Weeks    Status  New    Target Date  08/13/17      PT SHORT TERM GOAL #3   Title  Patient will report no new falls (except those related to seizures) in the last month to exhibit improved safety awareness and improved balance with ADLs.     Time  4    Period  Weeks    Status  New    Target Date  08/13/17        PT Long Term Goals - 07/16/17 1030      PT LONG TERM GOAL #1   Title  Patient will be independent in home exercise program to improve strength/mobility for better functional independence with ADLs.    Time  8    Period  Weeks    Status  New    Target Date  09/10/17      PT LONG TERM GOAL #2   Title  Pt will improve Berg Balance Assessment score by 4 points to decrease fall risk in home and community environment.     Baseline  07/16/17: 42/56 (significant fall risk)    Time  8    Period  Weeks    Status  New    Target Date  09/10/17      PT LONG TERM GOAL #3   Title  Patient will increase 10 meter walk test to >1.69m/s as to improve gait speed for better community ambulation and to reduce fall risk.    Baseline  07/16/17: 0.78 m/s    Time  8    Period  Weeks    Status  New    Target Date  09/10/17      PT LONG TERM GOAL #4   Title  Patient will increase BLE gross strength to 4+/5 as to improve  functional strength for independent gait, increased standing tolerance and increased ADL ability.     Time  8    Period  Weeks    Status  New    Target Date  09/10/17            Plan - 08/29/17 0759    Clinical Impression Statement  Patient tolerated therapy session well. Pt required CGA-min A throughout dynamic postural control and gait activities for stability and slight staggering during gait. Pt demonstrates greatest staggering with increased speed and dual tasks during gait; required VCs for proper technique and sequencing. PT provided education on ankle, hip, and stepping strategies for balance recovery. Pt will continue to benefit from skilled PT for improvements in balance, strength, and gait safety.    Rehab Potential  Fair    Clinical Impairments Affecting Rehab Potential  (-) seizures, high frequency of falls, co-morbidities (+) motivated, family support    PT Frequency  2x / week    PT Duration  8 weeks    PT Treatment/Interventions  Cryotherapy;Moist Heat;Gait training;Stair training;Functional mobility training;Therapeutic activities;Therapeutic exercise;Balance training;Neuromuscular re-education;Patient/family education;Visual/perceptual remediation/compensation    PT Next Visit Plan  Assess dynamic balance (DGI, FGA); progress HEP; work on static and dynamic balance    PT Home Exercise Plan  single limb stance, tandem stance     Consulted and Agree with Plan of Care  Patient  Patient will benefit from skilled therapeutic intervention in order to improve the following deficits and impairments:  Abnormal gait, Decreased activity tolerance, Decreased balance, Decreased coordination, Decreased endurance, Decreased mobility, Decreased safety awareness, Difficulty walking, Decreased strength, Dizziness, Impaired vision/preception, Postural dysfunction  Visit Diagnosis: Other abnormalities of gait and mobility  Muscle weakness (generalized)  History of  falling     Problem List Patient Active Problem List   Diagnosis Date Noted  . Seizure disorder ([place]) 06/12/2017  . Syncope 05/25/2017  . Medicare annual wellness visit, initial 02/24/2016  . Chest pain 04/01/2015  . CAD in native artery 03/10/2015  . Unstable angina pectoris ([place]) 02/23/2015  . Benign essential HTN 10/20/2014  . Chronic anxiety 10/20/2014  . Anxiety, generalized 10/20/2014  . Insomnia, persistent 10/20/2014  . Recurrent major depressive episodes ([place]) 10/20/2014  . Depression, major, single episode, severe ([place]) 10/20/2014  . Combined fat and carbohydrate induced hyperlipemia 10/20/2014  . Restless leg syndrome 10/20/2014  . MDD (major depressive disorder), recurrent episode, moderate ([place]) 10/20/2014  . AF (paroxysmal atrial fibrillation) ([place]) 07/31/2014  . Paroxysmal atrial fibrillation ([place]) 07/31/2014  . H/O total knee replacement 06/25/2014  . Total knee replacement status 06/10/2014  . H/O malignant neoplasm of breast 05/24/2014  . Restless leg 05/24/2014  . H/O: HTN (hypertension) 05/24/2014  . History of atrial fibrillation 05/24/2014  . CAFL (chronic airflow limitation) ([place]) 05/24/2014  . Vitamin B12 deficiency 05/24/2014  . H/O disease 05/24/2014  . H/O: hypothyroidism 05/24/2014  . H/O gastrointestinal disease 05/24/2014  . Addison anemia 02/16/2014  . Anxiety 11/06/2013  . A-fib ([place]) 11/06/2013  . Clinical depression 11/06/2013  . Acid reflux 11/06/2013  . Heart valve disease 11/06/2013  . Primary cancer of right female breast ([place]) 11/06/2013  . HLD (hyperlipidemia) 11/06/2013  . BP (high blood pressure) 11/06/2013  . Atrial fibrillation ([place]) 11/06/2013  . Chronic obstructive pulmonary disease ([place]) 11/06/2013  . Hormone receptor positive malignant neoplasm of breast ([place]) 11/06/2013  . Adult hypothyroidism 06/29/2010  . Adult BMI 30+ 06/29/2010  . B-complex deficiency 12/05/1995  . Extremity pain 12/05/1995   Tonya Walton,  SPT This entire session was performed under direct supervision and direction of a licensed therapist/therapist assistant . I have personally read, edited and approve of the note as written.  Trotter,Margaret PT, DPT 08/29/2017, 2:16 PM  [place] MAIN [facility] SERVICES [address] [place], [place], [address] Phone: [phone]   Fax:  [phone]  Name: Tonya Walton MRN: [id number] Date of Birth: 07-Jan-1951

## 2017-09-03 ENCOUNTER — Other Ambulatory Visit: Payer: Self-pay

## 2017-09-03 ENCOUNTER — Ambulatory Visit (INDEPENDENT_AMBULATORY_CARE_PROVIDER_SITE_OTHER): Payer: PPO | Admitting: Psychiatry

## 2017-09-03 ENCOUNTER — Ambulatory Visit: Payer: PPO | Attending: Neurology

## 2017-09-03 ENCOUNTER — Telehealth: Payer: Self-pay

## 2017-09-03 ENCOUNTER — Encounter: Payer: Self-pay | Admitting: Psychiatry

## 2017-09-03 VITALS — BP 148/82 | HR 73

## 2017-09-03 VITALS — BP 132/75 | HR 92 | Temp 99.1°F | Wt 201.0 lb

## 2017-09-03 DIAGNOSIS — Z9181 History of falling: Secondary | ICD-10-CM | POA: Insufficient documentation

## 2017-09-03 DIAGNOSIS — M6281 Muscle weakness (generalized): Secondary | ICD-10-CM | POA: Insufficient documentation

## 2017-09-03 DIAGNOSIS — R2689 Other abnormalities of gait and mobility: Secondary | ICD-10-CM | POA: Insufficient documentation

## 2017-09-03 DIAGNOSIS — F39 Unspecified mood [affective] disorder: Secondary | ICD-10-CM

## 2017-09-03 MED ORDER — CLONAZEPAM 0.5 MG PO TABS: 0.2500 mg | ORAL_TABLET | Freq: Every day | ORAL | 3 refills | Status: DC

## 2017-09-03 MED ORDER — FLUOXETINE HCL 40 MG PO CAPS: 40.0000 mg | ORAL_CAPSULE | Freq: Every day | ORAL | 1 refills | Status: DC

## 2017-09-03 NOTE — Telephone Encounter (Signed)
faxed and confirmed rx for klonopin .5mg  id # [id number] order # [id number] # 15 with 3 refills.

## 2017-09-03 NOTE — Therapy (Signed)
[place] MAIN [facility] SERVICES [address] [place], [place], [address] Phone: [phone]   Fax:  [phone]  Physical Therapy Treatment  Patient Details  Name: Tonya Walton MRN: [id number] Date of Birth: 04/08/1950 Referring Provider: Dr. Manuella Ghazi   Encounter Date: 09/03/2017  PT End of Session - 09/03/17 0811    Visit Number  7    Number of Visits  17    Date for PT Re-Evaluation  09/10/17    Authorization Type  progress note 7/10    PT Start Time  0800    PT Stop Time  0845    PT Time Calculation (min)  45 min    Equipment Utilized During Treatment  Gait belt    Activity Tolerance  Patient tolerated treatment well    Behavior During Therapy  [facility] for tasks assessed/performed       Past Medical History:  Diagnosis Date  . Anxiety   . Arthritis   . Atrial fibrillation, controlled ([place])   . Breast cancer ([place]) 2012   RT LUMPECTOMY  . CHF (congestive heart failure) ([place])   . COPD (chronic obstructive pulmonary disease) ([place])   . Coronary artery disease   . Depression   . Dysrhythmia    Afib  . GERD (gastroesophageal reflux disease)   . Headache   . Hypertension   . Hypothyroidism   . Neuromuscular disorder ([place])    nerve damage in legs  . Personal history of radiation therapy 2012   BREAST CA  . Restless leg syndrome   . Seizures ([place])   . Shortness of breath dyspnea     Past Surgical History:  Procedure Laterality Date  . APPENDECTOMY    . BREAST BIOPSY Left 07/2013   NEG  . BREAST BIOPSY Right 08/22/2016   US guided breast biopsy  . BREAST EXCISIONAL BIOPSY Right 2012   POS  . BREAST LUMPECTOMY     Right breast  . CARDIAC CATHETERIZATION N/A 03/02/2015   Procedure: Left Heart Cath and Coronary Angiography;  Surgeon: Teodoro Spray, MD;  Location: [place] [facility];  Service: Cardiovascular;  Laterality: N/A;  . CATARACT EXTRACTION W/PHACO Right 10/14/2014   Procedure: CATARACT EXTRACTION PHACO AND INTRAOCULAR  LENS PLACEMENT (IOC);  Surgeon: Leandrew Koyanagi, MD;  Location: [place];  Service: Ophthalmology;  Laterality: Right;  . CORONARY ARTERY BYPASS GRAFT N/A 04/01/2015   Procedure: CORONARY ARTERY BYPASS GRAFTING (CABG) x one , using left mammary artery;  Surgeon: Ivin Poot, MD;  Location: [place];  Service: Open Heart Surgery;  Laterality: N/A;  . JOINT REPLACEMENT Right    Total Knee Replacement  . TEE WITHOUT CARDIOVERSION N/A 04/01/2015   Procedure: TRANSESOPHAGEAL ECHOCARDIOGRAM (TEE);  Surgeon: Ivin Poot, MD;  Location: [place];  Service: Open Heart Surgery;  Laterality: N/A;  . TONSILLECTOMY    . TOTAL KNEE ARTHROPLASTY Right 06/10/2014   Procedure: TOTAL KNEE ARTHROPLASTY;  Surgeon: Dereck Leep, MD;  Location: [facility];  Service: Orthopedics;  Laterality: Right;  . TOTAL KNEE ARTHROPLASTY Left 09/02/2014   Procedure: TOTAL KNEE ARTHROPLASTY;  Surgeon: Dereck Leep, MD;  Location: [facility];  Service: Orthopedics;  Laterality: Left;    Vitals:   09/03/17 0801  BP: (!) 148/82  Pulse: 73  SpO2: 97%    Subjective Assessment - 09/03/17 0757    Subjective  Pt reports that she is doing well on this date. She has been paying attention to when she staggers  at the direction of her primary therapist. She states that she tends to stagger when she is close objects. She tends to fall backwards or sideways. No falls since her last therapy session. Pt believes that therapy has been very helpful and her balance has improved. Denies pain today. No specific questions or concerns.     Pertinent History  67 y.o. female referred to physical therapy for imbalance. PMH includes arthritis, A. fib, CHF, COPD, GERD, hypertension, and breast cancer with radiation. Peripheral neuropathy due to vitamin B deficiency with related imbalance. MD recommended cane on regular basis for ambulation. Pt reports 10 seizures in the last 2 and a half months and roughly 10 falls in the last 2 months. Pt reports  staggering with ambulation and general loss of balance in prolonged standing. Pt reports feeling weaker on L side; supported by objective strength findings.     Limitations  Walking;Standing    How long can you sit comfortably?  NA but sometimes loses balance when sitting    How long can you stand comfortably?  loses balance after about 5 min    How long can you walk comfortably?  imbalance often immediately; staggers     Patient Stated Goals  Help with the staggering    Currently in Pain?  No/denies          TREATMENT  Ther-ex  Warm-up on Octane cross trainer level 4 x 5 min during history (2 minute unbilled) Quantum leg press 105# x 20, 120# x 20, 135# x 20;  Sit to stand without UE support from regular height chair x 10, with Airex pad under feet x 10 (last 6 performed with alternating horizontal head turns);  Neuromuscular Re-education  foam roll in // bars: Static AP balance without UE support x 30s; AP rocks x 1 min without UE support; CGA for stability with min A for posterior LOB;  Airex: Airex pad balance with feet together and eyes closed 30s x 2; Airex pad balance with feet together and horizontal/vertical head turns 30s x 2 each;  Gait in hallway on carpet, CGA for all gait activities: Vertical ball tosses to self 80' x 2; Horizontal all tosses with therapist laterall 3' x 2 to each side;  VCs required to follow ball with head and eyes during tosses. Considerably more staggering noted with ball tosses requiring minA+1 support to prevent falls;                    PT Education - 09/03/17 0757    Education Details  exercise form/technique    Person(s) Educated  Patient    Methods  Explanation    Comprehension  Verbalized understanding       PT Short Term Goals - 07/16/17 1020      PT SHORT TERM GOAL #1   Title  Pt will increase LLE strength to 4/5 to improve functional strength for independent gait, increased standing tolerance and increased  ADL ability.    Time  4    Period  Weeks    Status  New    Target Date  08/13/17      PT SHORT TERM GOAL #2   Title  Pt will decrease 5 times sit-to-stand time to < 14 sec to demonstrate decreased fall risk and improved functional mobility.     Time  4    Period  Weeks    Status  New    Target Date  08/13/17  PT SHORT TERM GOAL #3   Title  Patient will report no new falls (except those related to seizures) in the last month to exhibit improved safety awareness and improved balance with ADLs.     Time  4    Period  Weeks    Status  New    Target Date  08/13/17        PT Long Term Goals - 07/16/17 1030      PT LONG TERM GOAL #1   Title  Patient will be independent in home exercise program to improve strength/mobility for better functional independence with ADLs.    Time  8    Period  Weeks    Status  New    Target Date  09/10/17      PT LONG TERM GOAL #2   Title  Pt will improve Berg Balance Assessment score by 4 points to decrease fall risk in home and community environment.     Baseline  07/16/17: 42/56 (significant fall risk)    Time  8    Period  Weeks    Status  New    Target Date  09/10/17      PT LONG TERM GOAL #3   Title  Patient will increase 10 meter walk test to >1.52m/s as to improve gait speed for better community ambulation and to reduce fall risk.    Baseline  07/16/17: 0.78 m/s    Time  8    Period  Weeks    Status  New    Target Date  09/10/17      PT LONG TERM GOAL #4   Title  Patient will increase BLE gross strength to 4+/5 as to improve functional strength for independent gait, increased standing tolerance and increased ADL ability.     Time  8    Period  Weeks    Status  New    Target Date  09/10/17            Plan - 09/03/17 8502    Clinical Impression Statement  Pt continues to make great progress with therapy on this date. Head turning activities while ambulating are the most difficult for her and pt demonstrates notable staggering  with walking ball tosses. Pt also demonstrates instability today with eyes closed on compliants surfaces. Pt encouraged to continue HEP and follow-up as scheduled.     Rehab Potential  Fair    Clinical Impairments Affecting Rehab Potential  (-) seizures, high frequency of falls, co-morbidities (+) motivated, family support    PT Frequency  2x / week    PT Duration  8 weeks    PT Treatment/Interventions  Cryotherapy;Moist Heat;Gait training;Stair training;Functional mobility training;Therapeutic activities;Therapeutic exercise;Balance training;Neuromuscular re-education;Patient/family education;Visual/perceptual remediation/compensation    PT Next Visit Plan  Assess dynamic balance (DGI, FGA); progress HEP; work on static and dynamic balance    PT Home Exercise Plan  single limb stance, tandem stance     Consulted and Agree with Plan of Care  Patient       Patient will benefit from skilled therapeutic intervention in order to improve the following deficits and impairments:  Abnormal gait, Decreased activity tolerance, Decreased balance, Decreased coordination, Decreased endurance, Decreased mobility, Decreased safety awareness, Difficulty walking, Decreased strength, Dizziness, Impaired vision/preception, Postural dysfunction  Visit Diagnosis: Other abnormalities of gait and mobility  Muscle weakness (generalized)     Problem List Patient Active Problem List   Diagnosis Date Noted  . Peripheral neuropathy, idiopathic 07/11/2017  .  Seizure disorder ([place]) 06/12/2017  . Syncope 05/25/2017  . Medicare annual wellness visit, initial 02/24/2016  . Chest pain 04/01/2015  . CAD in native artery 03/10/2015  . Unstable angina pectoris ([place]) 02/23/2015  . Benign essential HTN 10/20/2014  . Chronic anxiety 10/20/2014  . Anxiety, generalized 10/20/2014  . Insomnia, persistent 10/20/2014  . Recurrent major depressive episodes ([place]) 10/20/2014  . Depression, major, single episode, severe ([place])  10/20/2014  . Combined fat and carbohydrate induced hyperlipemia 10/20/2014  . Restless leg syndrome 10/20/2014  . MDD (major depressive disorder), recurrent episode, moderate ([place]) 10/20/2014  . AF (paroxysmal atrial fibrillation) ([place]) 07/31/2014  . Paroxysmal atrial fibrillation ([place]) 07/31/2014  . H/O total knee replacement 06/25/2014  . Total knee replacement status 06/10/2014  . H/O malignant neoplasm of breast 05/24/2014  . Restless leg 05/24/2014  . H/O: HTN (hypertension) 05/24/2014  . History of atrial fibrillation 05/24/2014  . CAFL (chronic airflow limitation) ([place]) 05/24/2014  . Vitamin B12 deficiency 05/24/2014  . H/O disease 05/24/2014  . H/O: hypothyroidism 05/24/2014  . H/O gastrointestinal disease 05/24/2014  . Addison anemia 02/16/2014  . Anxiety 11/06/2013  . A-fib ([place]) 11/06/2013  . Clinical depression 11/06/2013  . Acid reflux 11/06/2013  . Heart valve disease 11/06/2013  . Primary cancer of right female breast ([place]) 11/06/2013  . HLD (hyperlipidemia) 11/06/2013  . BP (high blood pressure) 11/06/2013  . Atrial fibrillation ([place]) 11/06/2013  . Chronic obstructive pulmonary disease ([place]) 11/06/2013  . Hormone receptor positive malignant neoplasm of breast ([place]) 11/06/2013  . Adult hypothyroidism 06/29/2010  . Adult BMI 30+ 06/29/2010  . B-complex deficiency 12/05/1995  . Extremity pain 12/05/1995   Phillips Grout PT, DPT, GCS  Saundra Gin 09/03/2017, 10:06 AM  [place] MAIN [facility] SERVICES [address] [place], [place], [address] Phone: [phone]   Fax:  [phone]  Name: Tonya Walton MRN: [id number] Date of Birth: [birth year]

## 2017-09-03 NOTE — Progress Notes (Signed)
BH MD/PA/NP OP Progress Note  09/03/2017 9:40 AM Tonya Walton  MRN:  [id number]  Subjective:  Pt is a 67 year old married female who presented for the follow-up. She reported that she has lost 28 pounds since June as she has a started eating healthy and also excising on a regular basis.  Patient reported that she is also doing exercise with the help of her neurologist so she can have by her neurologist.  She reported that she feels more alert now.  Patient reported that her lamotrigine dose has been regulated by her neurologist and she is also taking gabapentin.   She is not driving and her husband has been driving her around as she is not allowed to drive and she reported that she is waiting for her driving license to be resumed after 3 months.  We discussed about her medications in detail.  She reported that she is compliant with her medications.  Her husband is supportive.  She sleeps well at night.  She takes Klonopin 0.25 mg at bedtime.   Patient denied having any suicidal homicidal ideations or plans.  She denies having any perceptual disturbances.  She remains pleasant and cooperative during the interview.        She denied having any suicidal homicidal ideations or plans.    Chief Complaint:  Chief Complaint    Follow-up; Medication Refill     Visit Diagnosis:     ICD-10-CM   1. Episodic mood disorder ([place]) F39     Past Medical History:  Past Medical History:  Diagnosis Date  . Anxiety   . Arthritis   . Atrial fibrillation, controlled ([place])   . Breast cancer ([place]) 2012   RT LUMPECTOMY  . CHF (congestive heart failure) ([place])   . COPD (chronic obstructive pulmonary disease) ([place])   . Coronary artery disease   . Depression   . Dysrhythmia    Afib  . GERD (gastroesophageal reflux disease)   . Headache   . Hypertension   . Hypothyroidism   . Neuromuscular disorder ([place])    nerve damage in legs  . Personal history of radiation therapy 2012   BREAST CA  . Restless  leg syndrome   . Seizures ([place])   . Shortness of breath dyspnea     Past Surgical History:  Procedure Laterality Date  . APPENDECTOMY    . BREAST BIOPSY Left 07/2013   NEG  . BREAST BIOPSY Right 08/22/2016   US guided breast biopsy  . BREAST EXCISIONAL BIOPSY Right 2012   POS  . BREAST LUMPECTOMY     Right breast  . CARDIAC CATHETERIZATION N/A 03/02/2015   Procedure: Left Heart Cath and Coronary Angiography;  Surgeon: Teodoro Spray, MD;  Location: [facility];  Service: Cardiovascular;  Laterality: N/A;  . CATARACT EXTRACTION W/PHACO Right 10/14/2014   Procedure: CATARACT EXTRACTION PHACO AND INTRAOCULAR LENS PLACEMENT (IOC);  Surgeon: Leandrew Koyanagi, MD;  Location: [place];  Service: Ophthalmology;  Laterality: Right;  . CORONARY ARTERY BYPASS GRAFT N/A 04/01/2015   Procedure: CORONARY ARTERY BYPASS GRAFTING (CABG) x one , using left mammary artery;  Surgeon: Ivin Poot, MD;  Location: [facility];  Service: Open Heart Surgery;  Laterality: N/A;  . JOINT REPLACEMENT Right    Total Knee Replacement  . TEE WITHOUT CARDIOVERSION N/A 04/01/2015   Procedure: TRANSESOPHAGEAL ECHOCARDIOGRAM (TEE);  Surgeon: Ivin Poot, MD;  Location: [facility] [facility];  Service: Open Heart Surgery;  Laterality: N/A;  . TONSILLECTOMY    .  TOTAL KNEE ARTHROPLASTY Right 06/10/2014   Procedure: TOTAL KNEE ARTHROPLASTY;  Surgeon: Dereck Leep, MD;  Location: [facility];  Service: Orthopedics;  Laterality: Right;  . TOTAL KNEE ARTHROPLASTY Left 09/02/2014   Procedure: TOTAL KNEE ARTHROPLASTY;  Surgeon: Dereck Leep, MD;  Location: [facility];  Service: Orthopedics;  Laterality: Left;   Family History:  Family History  Problem Relation Age of Onset  . Leukemia Mother   . Bone cancer Father   . Heart disease Brother   . Heart disease Brother   . Diabetes Brother   . Hypertension Brother   . Diabetes Brother   . Hypertension Brother   . Heart disease Brother   . Breast cancer Paternal Aunt     Social History:  Social History   Socioeconomic History  . Marital status: Married    Spouse name: Not on file  . Number of children: Not on file  . Years of education: Not on file  . Highest education level: Not on file  Occupational History  . Not on file  Social Needs  . Financial resource strain: Not on file  . Food insecurity:    Worry: Not on file    Inability: Not on file  . Transportation needs:    Medical: Not on file    Non-medical: Not on file  Tobacco Use  . Smoking status: Former Smoker    Packs/day: 1.00    Types: Cigarettes    Last attempt to quit: 04/30/1992    Years since quitting: 25.3  . Smokeless tobacco: Never Used  Substance and Sexual Activity  . Alcohol use: No  . Drug use: No  . Sexual activity: Yes    Partners: Male    Birth control/protection: None  Lifestyle  . Physical activity:    Days per week: Not on file    Minutes per session: Not on file  . Stress: Not on file  Relationships  . Social connections:    Talks on phone: Not on file    Gets together: Not on file    Attends religious service: Not on file    Active member of club or organization: Not on file    Attends meetings of clubs or organizations: Not on file    Relationship status: Not on file  Other Topics Concern  . Not on file  Social History Narrative  . Not on file   Additional History:  She currently lives with her husband who is very helpful and has been taking care of her during her surgery. She reported that he is a good  nurse  Assessment:   Musculoskeletal: Strength & Muscle Tone: within normal limits Gait & Station: normal Patient leans: N/A  Psychiatric Specialty Exam: Medication Refill  Associated symptoms include chest pain. Pertinent negatives include no chills, congestion, nausea or rash.  Depression         Associated symptoms include does not have insomnia and no suicidal ideas.   Review of Systems  Constitutional: Negative for chills.  HENT:  Negative for congestion and tinnitus.   Eyes: Negative for double vision.  Respiratory: Negative for hemoptysis.   Cardiovascular: Positive for chest pain. Negative for palpitations.  Gastrointestinal: Positive for diarrhea. Negative for nausea.  Genitourinary: Negative for urgency.  Musculoskeletal: Positive for joint pain. Negative for back pain.  Skin: Negative for rash.  Neurological: Negative for sensory change.  Endo/Heme/Allergies: Negative for environmental allergies.  Psychiatric/Behavioral: Positive for depression. Negative for substance abuse and suicidal ideas. The  patient does not have insomnia.     Blood pressure 132/75, pulse 92, temperature 99.1 F (37.3 C), temperature source Oral, weight 201 lb (91.2 kg).Body mass index is 33.45 kg/m.  General Appearance: Casual  Eye Contact:  Fair  Speech:  Normal Rate  Volume:  Normal  Mood:  Anxious and Depressed  Affect:  Appropriate  Thought Process:  Coherent  Orientation:  Full (Time, Place, and Person)  Thought Content:  WDL  Suicidal Thoughts:  No  Homicidal Thoughts:  No  Memory:  Immediate;   Fair  Judgement:  Fair  Insight:  Fair  Psychomotor Activity:  Normal  Concentration:  Fair  Recall:  [employer] of Knowledge: Fair  Language: Fair  Akathisia:  No  Handed:  Right  AIMS (if indicated):  none  Assets:  Communication Skills Desire for Improvement Social Support  ADL's:  Intact  Cognition: WNL  Sleep:  8-10   Is the patient at risk to self?  No. Has the patient been a risk to self in the past 6 months?  No. Has the patient been a risk to self within the distant past?  No. Is the patient a risk to others?  No. Has the patient been a risk to others in the past 6 months?  No. Has the patient been a risk to others within the distant past?  No.  Current Medications: Current Outpatient Medications  Medication Sig Dispense Refill  . Artificial Tear Ointment (DRY EYES OP) Place 1 drop into both eyes at  bedtime.    Marland Kitchen aspirin EC 81 MG tablet Take 81 mg by mouth.    Marland Kitchen atorvastatin (LIPITOR) 40 MG tablet Take 1 tablet (40 mg total) by mouth daily at 6 PM. 30 tablet 3  . Cholecalciferol (VITAMIN D3) 2000 units capsule Take 2,000 Units by mouth daily.     . clonazePAM (KLONOPIN) 0.5 MG tablet Take 0.5 tablets (0.25 mg total) by mouth at bedtime. 15 tablet 2  . clotrimazole-betamethasone (LOTRISONE) cream Apply topically.    . cyclobenzaprine (FLEXERIL) 10 MG tablet Take 10 mg by mouth daily as needed for muscle spasms.     Marland Kitchen esomeprazole (NEXIUM) 40 MG capsule Take 40 mg by mouth daily.     Marland Kitchen FLUoxetine (PROZAC) 40 MG capsule Take 1 capsule (40 mg total) by mouth daily. 90 capsule 1  . folic acid (FOLVITE) 1 MG tablet Take 1 mg by mouth 2 (two) times a week.    . gabapentin (NEURONTIN) 300 MG capsule Take 300 mg by mouth.    . lamoTRIgine (LAMICTAL) 25 MG tablet Take 25 mg by mouth daily.    Marland Kitchen levothyroxine (SYNTHROID, LEVOTHROID) 25 MCG tablet Take 25 mcg by mouth daily.     Marland Kitchen loratadine (CLARITIN) 10 MG tablet Take 10 mg by mouth daily.     . meloxicam (MOBIC) 15 MG tablet Take 7.5 mg by mouth 2 (two) times daily.     . montelukast (SINGULAIR) 10 MG tablet Take 10 mg by mouth at bedtime.    Marland Kitchen oxybutynin (DITROPAN) 5 MG tablet Take 5 mg by mouth 3 (three) times daily as needed for bladder spasms.    . pramipexole (MIRAPEX) 0.25 MG tablet Take 0.25-0.5 mg by mouth 3 (three) times daily. Pt takes two tablets in the morning, one tablet at noon, and two tablets at bedtime.    . silver sulfADIAZINE (SILVADENE) 1 % cream Apply topically.    . vitamin B-12 (CYANOCOBALAMIN) 1000 MCG tablet Take  1,000 mcg by mouth daily.     No current facility-administered medications for this visit.     Medical Decision Making:  Established Problem, Stable/Improving (1) and Review of Last Therapy Session (1)  Treatment Plan Summary:Medication management  Depression Continue on Prozac 40 mg in the morning- 90 day  supply given  Continue  lamotrigine dose has been titrated by her neurologist to 75 mg in am and 50 mg qhs    Klonopin 0.25 mg at bedtime.     Follow-up She will follow-up in 3 months    More than 50% of the time spent in psychoeducation, counseling and coordination of care.     This note was generated in part or whole with voice recognition software. Voice regonition is usually quite accurate but there are transcription errors that can and very often do occur. I apologize for any typographical errors that were not detected and corrected.    Rainey Pines, MD  09/03/2017, 9:40 AM

## 2017-09-05 ENCOUNTER — Ambulatory Visit: Payer: PPO | Admitting: Physical Therapy

## 2017-09-05 ENCOUNTER — Encounter: Payer: Self-pay | Admitting: Physical Therapy

## 2017-09-05 DIAGNOSIS — Z9181 History of falling: Secondary | ICD-10-CM

## 2017-09-05 DIAGNOSIS — M6281 Muscle weakness (generalized): Secondary | ICD-10-CM

## 2017-09-05 DIAGNOSIS — R2689 Other abnormalities of gait and mobility: Secondary | ICD-10-CM

## 2017-09-05 NOTE — Therapy (Signed)
[place] MAIN [facility] SERVICES [address] [place], [place], [address] Phone: [phone]   Fax:  [phone]  Physical Therapy Treatment  Patient Details  Name: Tonya Walton MRN: [id number] Date of Birth: [birth year] Referring Provider: Dr. Manuella Ghazi   Encounter Date: 09/05/2017  PT End of Session - 09/05/17 0810    Visit Number  8    Number of Visits  17    Date for PT Re-Evaluation  09/10/17    Authorization Type  progress note 8/10    PT Start Time  0803    PT Stop Time  0845    PT Time Calculation (min)  42 min    Equipment Utilized During Treatment  Gait belt    Activity Tolerance  Patient tolerated treatment well    Behavior During Therapy  [facility] for tasks assessed/performed       Past Medical History:  Diagnosis Date  . Anxiety   . Arthritis   . Atrial fibrillation, controlled ([place])   . Breast cancer ([place]) 2012   RT LUMPECTOMY  . CHF (congestive heart failure) ([place])   . COPD (chronic obstructive pulmonary disease) ([place])   . Coronary artery disease   . Depression   . Dysrhythmia    Afib  . GERD (gastroesophageal reflux disease)   . Headache   . Hypertension   . Hypothyroidism   . Neuromuscular disorder ([place])    nerve damage in legs  . Personal history of radiation therapy 2012   BREAST CA  . Restless leg syndrome   . Seizures ([place])   . Shortness of breath dyspnea     Past Surgical History:  Procedure Laterality Date  . APPENDECTOMY    . BREAST BIOPSY Left 07/2013   NEG  . BREAST BIOPSY Right 08/22/2016   US guided breast biopsy  . BREAST EXCISIONAL BIOPSY Right 2012   POS  . BREAST LUMPECTOMY     Right breast  . CARDIAC CATHETERIZATION N/A 03/02/2015   Procedure: Left Heart Cath and Coronary Angiography;  Surgeon: Teodoro Spray, MD;  Location: [facility];  Service: Cardiovascular;  Laterality: N/A;  . CATARACT EXTRACTION W/PHACO Right 10/14/2014   Procedure: CATARACT EXTRACTION PHACO AND INTRAOCULAR  LENS PLACEMENT (IOC);  Surgeon: Leandrew Koyanagi, MD;  Location: [facility];  Service: Ophthalmology;  Laterality: Right;  . CORONARY ARTERY BYPASS GRAFT N/A 04/01/2015   Procedure: CORONARY ARTERY BYPASS GRAFTING (CABG) x one , using left mammary artery;  Surgeon: Ivin Poot, MD;  Location: [place];  Service: Open Heart Surgery;  Laterality: N/A;  . JOINT REPLACEMENT Right    Total Knee Replacement  . TEE WITHOUT CARDIOVERSION N/A 04/01/2015   Procedure: TRANSESOPHAGEAL ECHOCARDIOGRAM (TEE);  Surgeon: Ivin Poot, MD;  Location: [place];  Service: Open Heart Surgery;  Laterality: N/A;  . TONSILLECTOMY    . TOTAL KNEE ARTHROPLASTY Right 06/10/2014   Procedure: TOTAL KNEE ARTHROPLASTY;  Surgeon: Dereck Leep, MD;  Location: [facility];  Service: Orthopedics;  Laterality: Right;  . TOTAL KNEE ARTHROPLASTY Left 09/02/2014   Procedure: TOTAL KNEE ARTHROPLASTY;  Surgeon: Dereck Leep, MD;  Location: [facility];  Service: Orthopedics;  Laterality: Left;    There were no vitals filed for this visit.  Subjective Assessment - 09/05/17 0809    Subjective  Patient reports that she is doing well; denies any pain; denies any new falls; reports still having fluid in her legs but states that its better since starting  therapy;     Pertinent History  67 y.o. female referred to physical therapy for imbalance. PMH includes arthritis, A. fib, CHF, COPD, GERD, hypertension, and breast cancer with radiation. Peripheral neuropathy due to vitamin B deficiency with related imbalance. MD recommended cane on regular basis for ambulation. Pt reports 10 seizures in the last 2 and a half months and roughly 10 falls in the last 2 months. Pt reports staggering with ambulation and general loss of balance in prolonged standing. Pt reports feeling weaker on L side; supported by objective strength findings.     Limitations  Walking;Standing    How long can you sit comfortably?  NA but sometimes loses balance when sitting     How long can you stand comfortably?  loses balance after about 5 min    How long can you walk comfortably?  imbalance often immediately; staggers     Patient Stated Goals  Help with the staggering    Currently in Pain?  No/denies    Multiple Pain Sites  No           TREATMENT  Ther-ex  Warm-up on Octane cross trainer level 4 x 4 min (unbilled);  Quantum leg press  BLE: 120#   2x 20,  Sit to stand without UE support from regular height chair x 10, with Airex pad under feet x 10 (last 6 performed with alternating horizontal head turns);  Neuromuscular Re-education  foam roll in // bars (round side up): -Heel raises x15 with B rail assist: Static AP balance with BUE wand flexion x12 reps with CGA for safety;  Tandem stance, head turns side/side, up/down x5 reps each foot in front; Patient required min VCs for balance stability, including to increase trunk control for less loss of balance with smaller base of support  Obstacle negotiation: stepping over orange hurdle, up airex-step-airex and down to floor, unsupported, reciprocal steps x8 laps with cues to increase speed for better balance and control; able to exhibit better control with increased repetition;   Airex: Modified tandem stance, reaching for cones and passing to other side, #5 cones, x2 sets each foot in front;  Resisted walking 12.5# forward/backward, side/side x2 laps each direction (4 way), with min A for balance and cues for weight shift especially with eccentric return for better dynamic gait control;  Forward walking with high knee stepping with contralateral UE lift x30 feet, followed with backwards walking x30 feet x3 sets each with cues for sequencing/coordination; with increased repetition patient able to exhibit better control and coordination;                     PT Education - 09/05/17 0810    Education Details  exercise, balance;     Person(s) Educated  Patient    Methods   Explanation;Demonstration;Verbal cues    Comprehension  Verbalized understanding;Returned demonstration;Verbal cues required;Need further instruction       PT Short Term Goals - 07/16/17 1020      PT SHORT TERM GOAL #1   Title  Pt will increase LLE strength to 4/5 to improve functional strength for independent gait, increased standing tolerance and increased ADL ability.    Time  4    Period  Weeks    Status  New    Target Date  08/13/17      PT SHORT TERM GOAL #2   Title  Pt will decrease 5 times sit-to-stand time to < 14 sec to demonstrate decreased fall risk and  improved functional mobility.     Time  4    Period  Weeks    Status  New    Target Date  08/13/17      PT SHORT TERM GOAL #3   Title  Patient will report no new falls (except those related to seizures) in the last month to exhibit improved safety awareness and improved balance with ADLs.     Time  4    Period  Weeks    Status  New    Target Date  08/13/17        PT Long Term Goals - 07/16/17 1030      PT LONG TERM GOAL #1   Title  Patient will be independent in home exercise program to improve strength/mobility for better functional independence with ADLs.    Time  8    Period  Weeks    Status  New    Target Date  09/10/17      PT LONG TERM GOAL #2   Title  Pt will improve Berg Balance Assessment score by 4 points to decrease fall risk in home and community environment.     Baseline  07/16/17: 42/56 (significant fall risk)    Time  8    Period  Weeks    Status  New    Target Date  09/10/17      PT LONG TERM GOAL #3   Title  Patient will increase 10 meter walk test to >1.34m/s as to improve gait speed for better community ambulation and to reduce fall risk.    Baseline  07/16/17: 0.78 m/s    Time  8    Period  Weeks    Status  New    Target Date  09/10/17      PT LONG TERM GOAL #4   Title  Patient will increase BLE gross strength to 4+/5 as to improve functional strength for independent gait, increased  standing tolerance and increased ADL ability.     Time  8    Period  Weeks    Status  New    Target Date  09/10/17            Plan - 09/05/17 0945    Clinical Impression Statement  Patient instructed in advanced LE strengthening and balance exercise. She is progressing well. She was able to exhibit better control with reciprocal stepping during obstacle negotiation following instruction. Patient does continue to have instability when doing new activities especially on compliant surfaces with narrow base of support. She would benefit from additional skilled PT intervention to improve strength, balance and gait safety;     Rehab Potential  Fair    Clinical Impairments Affecting Rehab Potential  (-) seizures, high frequency of falls, co-morbidities (+) motivated, family support    PT Frequency  2x / week    PT Duration  8 weeks    PT Treatment/Interventions  Cryotherapy;Moist Heat;Gait training;Stair training;Functional mobility training;Therapeutic activities;Therapeutic exercise;Balance training;Neuromuscular re-education;Patient/family education;Visual/perceptual remediation/compensation    PT Next Visit Plan  Assess dynamic balance (DGI, FGA); progress HEP; work on static and dynamic balance    PT Home Exercise Plan  single limb stance, tandem stance     Consulted and Agree with Plan of Care  Patient       Patient will benefit from skilled therapeutic intervention in order to improve the following deficits and impairments:  Abnormal gait, Decreased activity tolerance, Decreased balance, Decreased coordination, Decreased endurance, Decreased mobility, Decreased safety awareness,  Difficulty walking, Decreased strength, Dizziness, Impaired vision/preception, Postural dysfunction  Visit Diagnosis: Other abnormalities of gait and mobility  Muscle weakness (generalized)  History of falling     Problem List Patient Active Problem List   Diagnosis Date Noted  . Peripheral  neuropathy, idiopathic 07/11/2017  . Seizure disorder ([place]) 06/12/2017  . Syncope 05/25/2017  . Medicare annual wellness visit, initial 02/24/2016  . Chest pain 04/01/2015  . CAD in native artery 03/10/2015  . Unstable angina pectoris ([place]) 02/23/2015  . Benign essential HTN 10/20/2014  . Chronic anxiety 10/20/2014  . Anxiety, generalized 10/20/2014  . Insomnia, persistent 10/20/2014  . Recurrent major depressive episodes ([place]) 10/20/2014  . Depression, major, single episode, severe ([place]) 10/20/2014  . Combined fat and carbohydrate induced hyperlipemia 10/20/2014  . Restless leg syndrome 10/20/2014  . MDD (major depressive disorder), recurrent episode, moderate ([place]) 10/20/2014  . AF (paroxysmal atrial fibrillation) ([place]) 07/31/2014  . Paroxysmal atrial fibrillation ([place]) 07/31/2014  . H/O total knee replacement 06/25/2014  . Total knee replacement status 06/10/2014  . H/O malignant neoplasm of breast 05/24/2014  . Restless leg 05/24/2014  . H/O: HTN (hypertension) 05/24/2014  . History of atrial fibrillation 05/24/2014  . CAFL (chronic airflow limitation) ([place]) 05/24/2014  . Vitamin B12 deficiency 05/24/2014  . H/O disease 05/24/2014  . H/O: hypothyroidism 05/24/2014  . H/O gastrointestinal disease 05/24/2014  . [place]ison anemia 02/16/2014  . Anxiety 11/06/2013  . A-fib ([place]) 11/06/2013  . Clinical depression 11/06/2013  . Acid reflux 11/06/2013  . Heart valve disease 11/06/2013  . Primary cancer of right female breast ([place]) 11/06/2013  . HLD (hyperlipidemia) 11/06/2013  . BP (high blood pressure) 11/06/2013  . Atrial fibrillation ([place]) 11/06/2013  . Chronic obstructive pulmonary disease ([place]) 11/06/2013  . Hormone receptor positive malignant neoplasm of breast ([place]) 11/06/2013  . Adult hypothyroidism 06/29/2010  . Adult BMI 30+ 06/29/2010  . B-complex deficiency 12/05/1995  . Extremity pain 12/05/1995    Trotter,Margaret PT, DPT 09/05/2017, 9:56 AM  [place] MAIN [facility] SERVICES [address] [place], [place], [address] Phone: [phone]   Fax:  [phone]  Name: Tonya Walton MRN: [id number] Date of Birth: 1950/11/08

## 2017-09-10 ENCOUNTER — Ambulatory Visit: Payer: PPO | Admitting: Physical Therapy

## 2017-09-10 ENCOUNTER — Encounter: Payer: Self-pay | Admitting: Physical Therapy

## 2017-09-10 DIAGNOSIS — M6281 Muscle weakness (generalized): Secondary | ICD-10-CM

## 2017-09-10 DIAGNOSIS — Z9181 History of falling: Secondary | ICD-10-CM

## 2017-09-10 DIAGNOSIS — R2689 Other abnormalities of gait and mobility: Secondary | ICD-10-CM | POA: Diagnosis not present

## 2017-09-10 NOTE — Therapy (Signed)
[place] MAIN [facility] SERVICES [address] [place], [place], [address] Phone: [phone]   Fax:  [phone]  Physical Therapy Treatment Physical Therapy Progress Note   Dates of reporting period  07/16/17   to   09/10/17   Patient Details  Name: Tonya Walton MRN: [id number] Date of Birth: [birth year] Referring Provider: Dr. Manuella Ghazi   Encounter Date: 09/10/2017  PT End of Session - 09/10/17 0809    Visit Number  9    Number of Visits  29    Date for PT Re-Evaluation  10/22/17    Authorization Type  progress note 10/10    PT Start Time  0803    PT Stop Time  0845    PT Time Calculation (min)  42 min    Equipment Utilized During Treatment  Gait belt    Activity Tolerance  Patient tolerated treatment well    Behavior During Therapy  [facility] for tasks assessed/performed       Past Medical History:  Diagnosis Date  . Anxiety   . Arthritis   . Atrial fibrillation, controlled ([place])   . Breast cancer ([place]) 2012   RT LUMPECTOMY  . CHF (congestive heart failure) ([place])   . COPD (chronic obstructive pulmonary disease) ([place])   . Coronary artery disease   . Depression   . Dysrhythmia    Afib  . GERD (gastroesophageal reflux disease)   . Headache   . Hypertension   . Hypothyroidism   . Neuromuscular disorder ([place])    nerve damage in legs  . Personal history of radiation therapy 2012   BREAST CA  . Restless leg syndrome   . Seizures ([place])   . Shortness of breath dyspnea     Past Surgical History:  Procedure Laterality Date  . APPENDECTOMY    . BREAST BIOPSY Left 07/2013   NEG  . BREAST BIOPSY Right 08/22/2016   US guided breast biopsy  . BREAST EXCISIONAL BIOPSY Right 2012   POS  . BREAST LUMPECTOMY     Right breast  . CARDIAC CATHETERIZATION N/A 03/02/2015   Procedure: Left Heart Cath and Coronary Angiography;  Surgeon: Teodoro Spray, MD;  Location: [facility];  Service: Cardiovascular;  Laterality: N/A;  . CATARACT  EXTRACTION W/PHACO Right 10/14/2014   Procedure: CATARACT EXTRACTION PHACO AND INTRAOCULAR LENS PLACEMENT (IOC);  Surgeon: Leandrew Koyanagi, MD;  Location: [facility];  Service: Ophthalmology;  Laterality: Right;  . CORONARY ARTERY BYPASS GRAFT N/A 04/01/2015   Procedure: CORONARY ARTERY BYPASS GRAFTING (CABG) x one , using left mammary artery;  Surgeon: Ivin Poot, MD;  Location: [facility];  Service: Open Heart Surgery;  Laterality: N/A;  . JOINT REPLACEMENT Right    Total Knee Replacement  . TEE WITHOUT CARDIOVERSION N/A 04/01/2015   Procedure: TRANSESOPHAGEAL ECHOCARDIOGRAM (TEE);  Surgeon: Ivin Poot, MD;  Location: [facility];  Service: Open Heart Surgery;  Laterality: N/A;  . TONSILLECTOMY    . TOTAL KNEE ARTHROPLASTY Right 06/10/2014   Procedure: TOTAL KNEE ARTHROPLASTY;  Surgeon: Dereck Leep, MD;  Location: [facility];  Service: Orthopedics;  Laterality: Right;  . TOTAL KNEE ARTHROPLASTY Left 09/02/2014   Procedure: TOTAL KNEE ARTHROPLASTY;  Surgeon: Dereck Leep, MD;  Location: [facility];  Service: Orthopedics;  Laterality: Left;    There were no vitals filed for this visit.  Subjective Assessment - 09/10/17 0808    Subjective  Patient reports doing well; denies any pain; reports getting  ready for her trip; denies any fall;     Pertinent History  67 y.o. female referred to physical therapy for imbalance. PMH includes arthritis, A. fib, CHF, COPD, GERD, hypertension, and breast cancer with radiation. Peripheral neuropathy due to vitamin B deficiency with related imbalance. MD recommended cane on regular basis for ambulation. Pt reports 10 seizures in the last 2 and a half months and roughly 10 falls in the last 2 months. Pt reports staggering with ambulation and general loss of balance in prolonged standing. Pt reports feeling weaker on L side; supported by objective strength findings.     Limitations  Walking;Standing    How long can you sit comfortably?  NA but sometimes loses  balance when sitting    How long can you stand comfortably?  loses balance after about 5 min    How long can you walk comfortably?  imbalance often immediately; staggers     Patient Stated Goals  Help with the staggering    Currently in Pain?  No/denies    Multiple Pain Sites  No         [facility] PT Assessment - 09/10/17 0001      Strength   Right Hip Flexion  4/5    Left Hip Flexion  4/5    Right Knee Flexion  4+/5    Right Knee Extension  4+/5    Left Knee Flexion  4+/5    Left Knee Extension  4+/5    Right Ankle Dorsiflexion  4+/5    Left Ankle Dorsiflexion  4+/5      Standardized Balance Assessment   Five times sit to stand comments   16 sec, improved from 18.1 sec on 07/16/17; slight risk for falls;    10 Meter Walk  1.2 m/s without AD, community ambulator improved from 0.78 on 07/16/17      Berg Balance Test   Sit to Stand  Able to stand without using hands and stabilize independently    Standing Unsupported  Able to stand safely 2 minutes    Sitting with Back Unsupported but Feet Supported on Floor or Stool  Able to sit safely and securely 2 minutes    Stand to Sit  Sits safely with minimal use of hands    Transfers  Able to transfer safely, minor use of hands    Standing Unsupported with Eyes Closed  Able to stand 10 seconds with supervision    Standing Ubsupported with Feet Together  Able to place feet together independently and stand 1 minute safely    From Standing, Reach Forward with Outstretched Arm  Can reach confidently >25 cm (10")    From Standing Position, Pick up Object from Floor  Able to pick up shoe safely and easily    From Standing Position, Turn to Look Behind Over each Shoulder  Looks behind from both sides and weight shifts well    Turn 360 Degrees  Able to turn 360 degrees safely in 4 seconds or less    Standing Unsupported, Alternately Place Feet on Step/Stool  Able to stand independently and safely and complete 8 steps in 20 seconds    Standing  Unsupported, One Foot in Front  Able to plae foot ahead of the other independently and hold 30 seconds    Standing on One Leg  Able to lift leg independently and hold equal to or more than 3 seconds    Total Score  52    Berg comment:  low fall risk,  improved from 42/56 on 07/16/17      Dynamic Gait Index   Level Surface  Normal    Change in Gait Speed  Mild Impairment    Gait with Horizontal Head Turns  Mild Impairment    Gait with Vertical Head Turns  Mild Impairment    Gait and Pivot Turn  Mild Impairment    Step Over Obstacle  Mild Impairment    Step Around Obstacles  Normal    Steps  Mild Impairment    Total Score  18    DGI comment:  <19 indicates increased risk for falls; improved from 9/24 on 08/14/17        TREATMENT: Warm up on treadmill with 2 HHA, 1.5 mph x3 min with cues for erect posture and to increase step length to tolerance;   Patient instructed in outcome measures to address goals, see above;  PT instructed patient in dynamic balance tasks: Walk in hallway: -forward with lateral head turns calling out cards x100 feet x2 laps; -forward walking with up/down head turns x100 feet; -forward/backward walking directional change x100 feet; Required cues to slow down change in direction for better balance control; exhibits slight sway with directional change;  4 square stepping: clockwise x3 laps, counter-clockwise x3 laps, multi-directional steps x3 min with min A for safety; patient has difficulty keeping balance when stepping in different directions, especially backwards;   Star step outs: SLS x1 set each LE with mod VCs for sequencing and positioning; Patient had difficulty following cues for sequencing and often stepping with wrong foot or stepping to different spot than indicated. She required increased time for task;                     PT Education - 09/10/17 0809    Education Details  progress towards goals, exercise;     Person(s) Educated   Patient    Methods  Explanation;Demonstration;Verbal cues    Comprehension  Verbalized understanding;Returned demonstration;Verbal cues required;Need further instruction       PT Short Term Goals - 09/10/17 0827      PT SHORT TERM GOAL #1   Title  Pt will increase LLE strength to 4/5 to improve functional strength for independent gait, increased standing tolerance and increased ADL ability.    Time  4    Period  Weeks    Status  Achieved      PT SHORT TERM GOAL #2   Title  Pt will decrease 5 times sit-to-stand time to < 14 sec to demonstrate decreased fall risk and improved functional mobility.     Time  6    Period  Weeks    Status  Partially Met    Target Date  10/22/17      PT SHORT TERM GOAL #3   Title  Patient will report no new falls (except those related to seizures) in the last month to exhibit improved safety awareness and improved balance with ADLs.     Baseline  none in last month    Time  4    Period  Weeks    Status  Achieved        PT Long Term Goals - 09/10/17 1610      PT LONG TERM GOAL #1   Title  Patient will be independent in home exercise program to improve strength/mobility for better functional independence with ADLs.    Time  6    Period  Weeks    Status  On-going    Target Date  10/22/17      PT LONG TERM GOAL #2   Title  Pt will improve Berg Balance Assessment score by 4 points to decrease fall risk in home and community environment.     Baseline  07/16/17: 42/56 (significant fall risk)    Time  8    Period  Weeks    Status  Achieved      PT LONG TERM GOAL #3   Title  Patient will increase 10 meter walk test to >1.9ms as to improve gait speed for better community ambulation and to reduce fall risk.    Baseline  07/16/17: 0.78 m/s    Time  8    Period  Weeks    Status  Achieved      PT LONG TERM GOAL #4   Title  Patient will increase BLE gross strength to 4+/5 as to improve functional strength for independent gait, increased standing  tolerance and increased ADL ability.     Time  6    Period  Weeks    Status  Partially Met    Target Date  10/22/17      PT LONG TERM GOAL #5   Title  Patient will increase dynamic gait index score to >19/24 as to demonstrate reduced fall risk and improved dynamic gait balance for better safety with community/home ambulation.     Time  6    Period  Weeks    Status  New    Target Date  10/22/17            Plan - 09/10/17 0913    Clinical Impression Statement  PT assessed patient's goals to address progress. Patient has made significant improvement in static and dynamic balance. She is still considered a mild fall risk as per DGI. Patient also exhibits improved LE strength however is still weak in LE especially in hips. Patient instructed in dynamic balance tasks. She had difficulty with multi-directional stepping requiring min A for safety when changing directions. she would benefit from additional skilled PT intervention to improve balance and gait safety; Patient's condition has the potential to improve in response to therapy. Maximum improvement is yet to be obtained. The anticipated improvement is attainable and reasonable in a generally predictable time. Start date of reporting period 07/16/17 end date of reporting period 09/10/17. Patient reports doing some better but reports still feeling unsteady during turns or when stepping outside base of support;       Rehab Potential  Fair    Clinical Impairments Affecting Rehab Potential  (-) seizures, high frequency of falls, co-morbidities (+) motivated, family support    PT Frequency  2x / week    PT Duration  6 weeks    PT Treatment/Interventions  Cryotherapy;Moist Heat;Gait training;Stair training;Functional mobility training;Therapeutic activities;Therapeutic exercise;Balance training;Neuromuscular re-education;Patient/family education;Visual/perceptual remediation/compensation    PT Next Visit Plan  Assess dynamic balance (DGI, FGA);  progress HEP; work on static and dynamic balance    PT Home Exercise Plan  single limb stance, tandem stance     Consulted and Agree with Plan of Care  Patient       Patient will benefit from skilled therapeutic intervention in order to improve the following deficits and impairments:  Abnormal gait, Decreased activity tolerance, Decreased balance, Decreased coordination, Decreased endurance, Decreased mobility, Decreased safety awareness, Difficulty walking, Decreased strength, Dizziness, Impaired vision/preception, Postural dysfunction  Visit Diagnosis: Other abnormalities of gait and mobility  Muscle weakness (generalized)  History  of falling     Problem List Patient Active Problem List   Diagnosis Date Noted  . Peripheral neuropathy, idiopathic 07/11/2017  . Seizure disorder ([place]) 06/12/2017  . Syncope 05/25/2017  . Medicare annual wellness visit, initial 02/24/2016  . Chest pain 04/01/2015  . CAD in native artery 03/10/2015  . Unstable angina pectoris ([place]) 02/23/2015  . Benign essential HTN 10/20/2014  . Chronic anxiety 10/20/2014  . Anxiety, generalized 10/20/2014  . Insomnia, persistent 10/20/2014  . Recurrent major depressive episodes ([place]) 10/20/2014  . Depression, major, single episode, severe ([place]) 10/20/2014  . Combined fat and carbohydrate induced hyperlipemia 10/20/2014  . Restless leg syndrome 10/20/2014  . MDD (major depressive disorder), recurrent episode, moderate ([place]) 10/20/2014  . AF (paroxysmal atrial fibrillation) ([place]) 07/31/2014  . Paroxysmal atrial fibrillation ([place]) 07/31/2014  . H/O total knee replacement 06/25/2014  . Total knee replacement status 06/10/2014  . H/O malignant neoplasm of breast 05/24/2014  . Restless leg 05/24/2014  . H/O: HTN (hypertension) 05/24/2014  . History of atrial fibrillation 05/24/2014  . CAFL (chronic airflow limitation) ([place]) 05/24/2014  . Vitamin B12 deficiency 05/24/2014  . H/O disease 05/24/2014  . H/O:  hypothyroidism 05/24/2014  . H/O gastrointestinal disease 05/24/2014  . [place] anemia 02/16/2014  . Anxiety 11/06/2013  . A-fib ([place]) 11/06/2013  . Clinical depression 11/06/2013  . Acid reflux 11/06/2013  . Heart valve disease 11/06/2013  . Primary cancer of right female breast ([place]) 11/06/2013  . HLD (hyperlipidemia) 11/06/2013  . BP (high blood pressure) 11/06/2013  . Atrial fibrillation ([place]) 11/06/2013  . Chronic obstructive pulmonary disease ([place]) 11/06/2013  . Hormone receptor positive malignant neoplasm of breast ([place]) 11/06/2013  . Adult hypothyroidism 06/29/2010  . Adult BMI 30+ 06/29/2010  . B-complex deficiency 12/05/1995  . Extremity pain 12/05/1995    Trotter,Margaret PT, DPT 09/10/2017, 11:37 AM  [place] MAIN [facility] SERVICES [address] [place], [place], [address] Phone: [phone]   Fax:  [phone]  Name: Tonya Walton MRN: [id number] Date of Birth: [birth year]

## 2017-09-12 ENCOUNTER — Ambulatory Visit: Payer: PPO | Admitting: Physical Therapy

## 2017-09-17 ENCOUNTER — Ambulatory Visit: Payer: PPO | Admitting: Physical Therapy

## 2017-09-24 ENCOUNTER — Ambulatory Visit: Payer: PPO | Admitting: Physical Therapy

## 2017-09-26 ENCOUNTER — Encounter: Payer: Self-pay | Admitting: Physical Therapy

## 2017-09-26 ENCOUNTER — Ambulatory Visit: Payer: PPO | Admitting: Physical Therapy

## 2017-09-26 DIAGNOSIS — R2689 Other abnormalities of gait and mobility: Secondary | ICD-10-CM | POA: Diagnosis not present

## 2017-09-26 DIAGNOSIS — M6281 Muscle weakness (generalized): Secondary | ICD-10-CM

## 2017-09-26 DIAGNOSIS — Z9181 History of falling: Secondary | ICD-10-CM

## 2017-09-26 NOTE — Therapy (Addendum)
[place] MAIN [facility] SERVICES [address] [place], [place], [address] Phone: [phone]   Fax:  [phone]  Physical Therapy Treatment  Patient Details  Name: Tonya Walton MRN: [id number] Date of Birth: Aug 20, 1950 Referring Provider: Dr. Manuella Ghazi   Encounter Date: 09/26/2017  PT End of Session - 09/26/17 1150    Visit Number  10    Number of Visits  29    Date for PT Re-Evaluation  10/22/17    Authorization Type  progress note 1/10    PT Start Time  1145    PT Stop Time  1230    PT Time Calculation (min)  45 min    Equipment Utilized During Treatment  Gait belt    Activity Tolerance  Patient tolerated treatment well    Behavior During Therapy  [facility] for tasks assessed/performed       Past Medical History:  Diagnosis Date  . Anxiety   . Arthritis   . Atrial fibrillation, controlled ([place])   . Breast cancer ([employer]) 2012   RT LUMPECTOMY  . CHF (congestive heart failure) ([place])   . COPD (chronic obstructive pulmonary disease) ([place])   . Coronary artery disease   . Depression   . Dysrhythmia    Afib  . GERD (gastroesophageal reflux disease)   . Headache   . Hypertension   . Hypothyroidism   . Neuromuscular disorder ([place])    nerve damage in legs  . Personal history of radiation therapy 2012   BREAST CA  . Restless leg syndrome   . Seizures ([place])   . Shortness of breath dyspnea     Past Surgical History:  Procedure Laterality Date  . APPENDECTOMY    . BREAST BIOPSY Left 07/2013   NEG  . BREAST BIOPSY Right 08/22/2016   US guided breast biopsy  . BREAST EXCISIONAL BIOPSY Right 2012   POS  . BREAST LUMPECTOMY     Right breast  . CARDIAC CATHETERIZATION N/A 03/02/2015   Procedure: Left Heart Cath and Coronary Angiography;  Surgeon: Teodoro Spray, MD;  Location: [facility];  Service: Cardiovascular;  Laterality: N/A;  . CATARACT EXTRACTION W/PHACO Right 10/14/2014   Procedure: CATARACT EXTRACTION PHACO AND INTRAOCULAR  LENS PLACEMENT (IOC);  Surgeon: Leandrew Koyanagi, MD;  Location: [facility];  Service: Ophthalmology;  Laterality: Right;  . CORONARY ARTERY BYPASS GRAFT N/A 04/01/2015   Procedure: CORONARY ARTERY BYPASS GRAFTING (CABG) x one , using left mammary artery;  Surgeon: Ivin Poot, MD;  Location: [facility];  Service: Open Heart Surgery;  Laterality: N/A;  . JOINT REPLACEMENT Right    Total Knee Replacement  . TEE WITHOUT CARDIOVERSION N/A 04/01/2015   Procedure: TRANSESOPHAGEAL ECHOCARDIOGRAM (TEE);  Surgeon: Ivin Poot, MD;  Location: [facility];  Service: Open Heart Surgery;  Laterality: N/A;  . TONSILLECTOMY    . TOTAL KNEE ARTHROPLASTY Right 06/10/2014   Procedure: TOTAL KNEE ARTHROPLASTY;  Surgeon: Dereck Leep, MD;  Location: [facility];  Service: Orthopedics;  Laterality: Right;  . TOTAL KNEE ARTHROPLASTY Left 09/02/2014   Procedure: TOTAL KNEE ARTHROPLASTY;  Surgeon: Dereck Leep, MD;  Location: [facility];  Service: Orthopedics;  Laterality: Left;    There were no vitals filed for this visit.  Subjective Assessment - 09/26/17 1146    Subjective  Patient reports she is doing well; denies any pain or new falls since last seen. She enjoyed her trip.    Pertinent History  67 y.o. female  referred to physical therapy for imbalance. PMH includes arthritis, A. fib, CHF, COPD, GERD, hypertension, and breast cancer with radiation. Peripheral neuropathy due to vitamin B deficiency with related imbalance. MD recommended cane on regular basis for ambulation. Pt reports 10 seizures in the last 2 and a half months and roughly 10 falls in the last 2 months. Pt reports staggering with ambulation and general loss of balance in prolonged standing. Pt reports feeling weaker on L side; supported by objective strength findings.     Limitations  Walking;Standing    How long can you sit comfortably?  NA but sometimes loses balance when sitting    How long can you stand comfortably?  loses balance after about  5 min    How long can you walk comfortably?  imbalance often immediately; staggers     Patient Stated Goals  Help with the staggering    Currently in Pain?  No/denies       Treatment Warm up on treadmill with 2 HHA, 1.5 mph x3 min with cues for erect posture and to increase step length to tolerance;   Gait in hallway: -Horizontal head turns x160 ft -Vertical head turns x160 ft -Speed changes x160 ft -Direction changes x 100 ft -[employer] with PT x160 ft -Dribbling ball while walking forwards x160 ft -UE/LE alternating lifts x160 ft CGA-min A for all activities with VCs for proper technique and sequencing especially with dual-task activities; demonstrated difficulty with coordinating alternating UE/LE lifts while walking  Side stepping with ball tosses against wall x2 laps of 50 ft one way, CGA for safety with VCs for proper technique when side stepping   Stepping over bolster and 6 inch hurdle in // bars without UE support x4 laps; supervision for safety with VCs to try to maintain walking speed when coming to step over object  4 square stepping clockwise x2 laps, counter-clockwise x2 laps, PT calling out number x1 min bout, CGA for safety, no LOB noted with VCs for shifting weight appropriately when moving between squares  Star stepping x2 rounds with each foot, CGA-min A for safety, requires VCs for sequencing and demonstrates increased difficulty when crossing midline especially posteriorly  Tapping to 3 stepping stones, 2 posteriorly and one horizontal across body x3 rounds with each foot to work on weight shifting and maintaining balance when crossing midline in the posterior direction, CGA for safety with intermittent UE support, VCs for sequencing and maintaining weight shift appropriately                     PT Education - 09/26/17 1149    Education Details  exercise technique, gait safety, balance    Person(s) Educated  Patient    Methods   Explanation;Demonstration;Verbal cues    Comprehension  Verbalized understanding;Returned demonstration;Verbal cues required;Need further instruction       PT Short Term Goals - 09/10/17 0827      PT SHORT TERM GOAL #1   Title  Pt will increase LLE strength to 4/5 to improve functional strength for independent gait, increased standing tolerance and increased ADL ability.    Time  4    Period  Weeks    Status  Achieved      PT SHORT TERM GOAL #2   Title  Pt will decrease 5 times sit-to-stand time to < 14 sec to demonstrate decreased fall risk and improved functional mobility.     Time  6    Period  Weeks  Status  Partially Met    Target Date  10/22/17      PT SHORT TERM GOAL #3   Title  Patient will report no new falls (except those related to seizures) in the last month to exhibit improved safety awareness and improved balance with ADLs.     Baseline  none in last month    Time  4    Period  Weeks    Status  Achieved        PT Long Term Goals - 09/10/17 0102      PT LONG TERM GOAL #1   Title  Patient will be independent in home exercise program to improve strength/mobility for better functional independence with ADLs.    Time  6    Period  Weeks    Status  On-going    Target Date  10/22/17      PT LONG TERM GOAL #2   Title  Pt will improve Berg Balance Assessment score by 4 points to decrease fall risk in home and community environment.     Baseline  07/16/17: 42/56 (significant fall risk)    Time  8    Period  Weeks    Status  Achieved      PT LONG TERM GOAL #3   Title  Patient will increase 10 meter walk test to >1.41ms as to improve gait speed for better community ambulation and to reduce fall risk.    Baseline  07/16/17: 0.78 m/s    Time  8    Period  Weeks    Status  Achieved      PT LONG TERM GOAL #4   Title  Patient will increase BLE gross strength to 4+/5 as to improve functional strength for independent gait, increased standing tolerance and increased  ADL ability.     Time  6    Period  Weeks    Status  Partially Met    Target Date  10/22/17      PT LONG TERM GOAL #5   Title  Patient will increase dynamic gait index score to >19/24 as to demonstrate reduced fall risk and improved dynamic gait balance for better safety with community/home ambulation.     Time  6    Period  Weeks    Status  New    Target Date  10/22/17            Plan - 09/26/17 1244    Clinical Impression Statement  Patient tolerated therapy session well. Session focused on dynamic gait activities with head turns and dual task activities; required CGA for safety during dynamic gait activities. Pt demonstrated increased difficulty with dual task activities and coordinating UE/LE alternating lifts with walking; required CGA and VCs for coordinating opposite arm and leg lifts. Pt demonstrated difficulty with shifting weight with stepping outside her BOS and stepping in posterior direction without LOB; required VCs for shifting weight and sequencing. Pt will continue to benefit from skilled PT intervention for improvements in balance, gait safety, and dual tasking/coordination.    Rehab Potential  Fair    Clinical Impairments Affecting Rehab Potential  (-) seizures, high frequency of falls, co-morbidities (+) motivated, family support    PT Frequency  2x / week    PT Duration  6 weeks    PT Treatment/Interventions  Cryotherapy;Moist Heat;Gait training;Stair training;Functional mobility training;Therapeutic activities;Therapeutic exercise;Balance training;Neuromuscular re-education;Patient/family education;Visual/perceptual remediation/compensation    PT Next Visit Plan  Assess dynamic balance (DGI, FGA); progress HEP; work on  static and dynamic balance    PT Home Exercise Plan  single limb stance, tandem stance     Consulted and Agree with Plan of Care  Patient       Patient will benefit from skilled therapeutic intervention in order to improve the following deficits  and impairments:  Abnormal gait, Decreased activity tolerance, Decreased balance, Decreased coordination, Decreased endurance, Decreased mobility, Decreased safety awareness, Difficulty walking, Decreased strength, Dizziness, Impaired vision/preception, Postural dysfunction  Visit Diagnosis: Other abnormalities of gait and mobility  Muscle weakness (generalized)  History of falling     Problem List Patient Active Problem List   Diagnosis Date Noted  . Peripheral neuropathy, idiopathic 07/11/2017  . Seizure disorder ([place]) 06/12/2017  . Syncope 05/25/2017  . Medicare annual wellness visit, initial 02/24/2016  . Chest pain 04/01/2015  . CAD in native artery 03/10/2015  . Unstable angina pectoris ([place]) 02/23/2015  . Benign essential HTN 10/20/2014  . Chronic anxiety 10/20/2014  . Anxiety, generalized 10/20/2014  . Insomnia, persistent 10/20/2014  . Recurrent major depressive episodes ([place]) 10/20/2014  . Depression, major, single episode, severe ([place]) 10/20/2014  . Combined fat and carbohydrate induced hyperlipemia 10/20/2014  . Restless leg syndrome 10/20/2014  . MDD (major depressive disorder), recurrent episode, moderate ([place]) 10/20/2014  . AF (paroxysmal atrial fibrillation) ([place]) 07/31/2014  . Paroxysmal atrial fibrillation ([place]) 07/31/2014  . H/O total knee replacement 06/25/2014  . Total knee replacement status 06/10/2014  . H/O malignant neoplasm of breast 05/24/2014  . Restless leg 05/24/2014  . H/O: HTN (hypertension) 05/24/2014  . History of atrial fibrillation 05/24/2014  . CAFL (chronic airflow limitation) ([place]) 05/24/2014  . Vitamin B12 deficiency 05/24/2014  . H/O disease 05/24/2014  . H/O: hypothyroidism 05/24/2014  . H/O gastrointestinal disease 05/24/2014  . [place]ison anemia 02/16/2014  . Anxiety 11/06/2013  . A-fib ([place]) 11/06/2013  . Clinical depression 11/06/2013  . Acid reflux 11/06/2013  . Heart valve disease 11/06/2013  . Primary cancer of right  female breast ([place]) 11/06/2013  . HLD (hyperlipidemia) 11/06/2013  . BP (high blood pressure) 11/06/2013  . Atrial fibrillation ([place]) 11/06/2013  . Chronic obstructive pulmonary disease ([place]) 11/06/2013  . Hormone receptor positive malignant neoplasm of breast ([place]) 11/06/2013  . Adult hypothyroidism 06/29/2010  . Adult BMI 30+ 06/29/2010  . B-complex deficiency 12/05/1995  . Extremity pain 12/05/1995   Harriet Masson, SPT This entire session was performed under direct supervision and direction of a licensed therapist/therapist assistant . I have personally read, edited and approve of the note as written.  Trotter,Margaret PT, DPT 09/26/2017, 1:36 PM  [place] MAIN [facility] SERVICES [address] [place], [place], [address] Phone: [phone]   Fax:  [phone]  Name: KHUSHBOO CHUCK MRN: [id number] Date of Birth: 1950/06/24

## 2017-10-03 ENCOUNTER — Encounter: Payer: Self-pay | Admitting: Physical Therapy

## 2017-10-03 ENCOUNTER — Ambulatory Visit: Payer: PPO | Attending: Neurology | Admitting: Physical Therapy

## 2017-10-03 DIAGNOSIS — M6281 Muscle weakness (generalized): Secondary | ICD-10-CM | POA: Insufficient documentation

## 2017-10-03 DIAGNOSIS — Z9181 History of falling: Secondary | ICD-10-CM | POA: Diagnosis not present

## 2017-10-03 DIAGNOSIS — R2689 Other abnormalities of gait and mobility: Secondary | ICD-10-CM | POA: Diagnosis not present

## 2017-10-03 NOTE — Therapy (Addendum)
[place] MAIN [facility] SERVICES [address] [place], [place], [address] Phone: [phone]   Fax:  [phone]  Physical Therapy Treatment  Patient Details  Name: Tonya Walton MRN: [id number] Date of Birth: 10-04-1950 Referring Provider: Dr. Manuella Ghazi   Encounter Date: 10/03/2017  PT End of Session - 10/03/17 0852    Visit Number  11    Number of Visits  29    Date for PT Re-Evaluation  10/22/17    Authorization Type  progress note 2/10    PT Start Time  0845    PT Stop Time  0930    PT Time Calculation (min)  45 min    Equipment Utilized During Treatment  Gait belt    Activity Tolerance  Patient tolerated treatment well    Behavior During Therapy  [facility] for tasks assessed/performed       Past Medical History:  Diagnosis Date  . Anxiety   . Arthritis   . Atrial fibrillation, controlled ([place])   . Breast cancer ([place]) 2012   RT LUMPECTOMY  . CHF (congestive heart failure) ([place])   . COPD (chronic obstructive pulmonary disease) ([place])   . Coronary artery disease   . Depression   . Dysrhythmia    Afib  . GERD (gastroesophageal reflux disease)   . Headache   . Hypertension   . Hypothyroidism   . Neuromuscular disorder ([place])    nerve damage in legs  . Personal history of radiation therapy 2012   BREAST CA  . Restless leg syndrome   . Seizures ([place])   . Shortness of breath dyspnea     Past Surgical History:  Procedure Laterality Date  . APPENDECTOMY    . BREAST BIOPSY Left 07/2013   NEG  . BREAST BIOPSY Right 08/22/2016   US guided breast biopsy  . BREAST EXCISIONAL BIOPSY Right 2012   POS  . BREAST LUMPECTOMY     Right breast  . CARDIAC CATHETERIZATION N/A 03/02/2015   Procedure: Left Heart Cath and Coronary Angiography;  Surgeon: Teodoro Spray, MD;  Location: [place] [facility];  Service: Cardiovascular;  Laterality: N/A;  . CATARACT EXTRACTION W/PHACO Right 10/14/2014   Procedure: CATARACT EXTRACTION PHACO AND INTRAOCULAR  LENS PLACEMENT (IOC);  Surgeon: Leandrew Koyanagi, MD;  Location: [place];  Service: Ophthalmology;  Laterality: Right;  . CORONARY ARTERY BYPASS GRAFT N/A 04/01/2015   Procedure: CORONARY ARTERY BYPASS GRAFTING (CABG) x one , using left mammary artery;  Surgeon: Ivin Poot, MD;  Location: [place];  Service: Open Heart Surgery;  Laterality: N/A;  . JOINT REPLACEMENT Right    Total Knee Replacement  . TEE WITHOUT CARDIOVERSION N/A 04/01/2015   Procedure: TRANSESOPHAGEAL ECHOCARDIOGRAM (TEE);  Surgeon: Ivin Poot, MD;  Location: [place];  Service: Open Heart Surgery;  Laterality: N/A;  . TONSILLECTOMY    . TOTAL KNEE ARTHROPLASTY Right 06/10/2014   Procedure: TOTAL KNEE ARTHROPLASTY;  Surgeon: Dereck Leep, MD;  Location: [facility];  Service: Orthopedics;  Laterality: Right;  . TOTAL KNEE ARTHROPLASTY Left 09/02/2014   Procedure: TOTAL KNEE ARTHROPLASTY;  Surgeon: Dereck Leep, MD;  Location: [facility];  Service: Orthopedics;  Laterality: Left;    There were no vitals filed for this visit.  Subjective Assessment - 10/03/17 0850    Subjective  Patient reports she is doing well; denies any pain or new falls since last visit. She had a good long weekend and took it easy.  Pertinent History  67 y.o. female referred to physical therapy for imbalance. PMH includes arthritis, A. fib, CHF, COPD, GERD, hypertension, and breast cancer with radiation. Peripheral neuropathy due to vitamin B deficiency with related imbalance. MD recommended cane on regular basis for ambulation. Pt reports 10 seizures in the last 2 and a half months and roughly 10 falls in the last 2 months. Pt reports staggering with ambulation and general loss of balance in prolonged standing. Pt reports feeling weaker on L side; supported by objective strength findings.     Limitations  Walking;Standing    How long can you sit comfortably?  NA but sometimes loses balance when sitting    How long can you stand comfortably?   loses balance after about 5 min    How long can you walk comfortably?  imbalance often immediately; staggers     Patient Stated Goals  Help with the staggering    Currently in Pain?  No/denies            Treatment Warm up on treadmill with 2 HHA, 2.0 mph x3 min with cues for erect posture and to increase step length and pick up feet high to avoid shuffling steps when walking;  Gait in hallway: -Horizontal head turns x160 ft x2 laps calling out red cards one direction and black cards on the way back -Vertical head turns x160 ft -[employer] with PT x160 ft -Dribbling ball while walking forwards x160 ft -UE/LE alternating lifts x160 ft CGA-min A for all activities with VCs for proper technique and sequencing especially with dual-task activities; demonstrated difficulty with coordinating alternating UE/LE lifts while walking with more difficulty with SLS on the RLE  Side stepping with ball tosses against wall x2 laps of 50 ft one way, CGA for safety with VCs for proper technique when side stepping  Weaving between #5 cones x3 laps, CGA for safety, VCs for turning completely between the cones; 1 minor LOB noted when turning  Side stepping over cones x2 laps each direction, CGA for safety, VCs for proper technique and sequencing when stepping over cones as well as foot placement  Tapping to 5 stepping stones, 3 posteriorly and 2 horizontally next to feet x2 rounds with each foot to work on weight shifting and maintaining balance when crossing midline in the posterior direction, CGA for safety with intermittent UE support, VCs for sequencing and maintaining weight shift appropriately   Tapping to 5 stepping stones, 3 anteriorly and 2 horizontally next to feet x2 rounds with each foot, no UE support required with CGA for safety, VCs for sequencing and foot use  Star stepping x2 rounds with each foot, CGA for safety with VCs for sequencing and weight shifting when stepping posteriorly;  increased ability to step outside BOS without UE support  Semi tandem stance with each foot on dynadisk x15 sec holds with each foot in front, intermittent UE support, CGA for safety, VCs for utilizing ankles to maintain balance and weight shift                 PT Education - 10/03/17 0851    Education Details  exercise technique/form, gait safety, balance    Person(s) Educated  Patient    Methods  Explanation;Demonstration;Verbal cues    Comprehension  Verbalized understanding;Returned demonstration;Verbal cues required;Need further instruction       PT Short Term Goals - 09/10/17 0827      PT SHORT TERM GOAL #1   Title  Pt will increase  LLE strength to 4/5 to improve functional strength for independent gait, increased standing tolerance and increased ADL ability.    Time  4    Period  Weeks    Status  Achieved      PT SHORT TERM GOAL #2   Title  Pt will decrease 5 times sit-to-stand time to < 14 sec to demonstrate decreased fall risk and improved functional mobility.     Time  6    Period  Weeks    Status  Partially Met    Target Date  10/22/17      PT SHORT TERM GOAL #3   Title  Patient will report no new falls (except those related to seizures) in the last month to exhibit improved safety awareness and improved balance with ADLs.     Baseline  none in last month    Time  4    Period  Weeks    Status  Achieved        PT Long Term Goals - 09/10/17 0177      PT LONG TERM GOAL #1   Title  Patient will be independent in home exercise program to improve strength/mobility for better functional independence with ADLs.    Time  6    Period  Weeks    Status  On-going    Target Date  10/22/17      PT LONG TERM GOAL #2   Title  Pt will improve Berg Balance Assessment score by 4 points to decrease fall risk in home and community environment.     Baseline  07/16/17: 42/56 (significant fall risk)    Time  8    Period  Weeks    Status  Achieved      PT LONG TERM  GOAL #3   Title  Patient will increase 10 meter walk test to >1.5ms as to improve gait speed for better community ambulation and to reduce fall risk.    Baseline  07/16/17: 0.78 m/s    Time  8    Period  Weeks    Status  Achieved      PT LONG TERM GOAL #4   Title  Patient will increase BLE gross strength to 4+/5 as to improve functional strength for independent gait, increased standing tolerance and increased ADL ability.     Time  6    Period  Weeks    Status  Partially Met    Target Date  10/22/17      PT LONG TERM GOAL #5   Title  Patient will increase dynamic gait index score to >19/24 as to demonstrate reduced fall risk and improved dynamic gait balance for better safety with community/home ambulation.     Time  6    Period  Weeks    Status  New    Target Date  10/22/17            Plan - 10/03/17 0947    Clinical Impression Statement  Patient tolerated therapy session well. Session focused on dynamic gait activities as well as dynamic postural control with activities outside BOS. Pt required CGA for safety during all gait activities with minor sways noted when performing head turns; required VCs to try to maintain forward gait progression and speed. Pt demonstrated improved ability to perform dual task activities with improved coordination with alternating UE/LE lifts; required VCs for proper sequencing. Pt performed toe tapping activities with LE reaching outside BOS; demonstrated improved ability to perform toe tap in  posterior direction specifically but required CGA for safety. Pt demonstrated improved ability in star stepping activity as well with VCs for sequencing and proper foot; required CGA-min A for stability. Pt will continue to benefit from skilled PT intervention for improvements in balance, gait safety, and dual tasking/coordination.     Rehab Potential  Fair    Clinical Impairments Affecting Rehab Potential  (-) seizures, high frequency of falls, co-morbidities (+)  motivated, family support    PT Frequency  2x / week    PT Duration  6 weeks    PT Treatment/Interventions  Cryotherapy;Moist Heat;Gait training;Stair training;Functional mobility training;Therapeutic activities;Therapeutic exercise;Balance training;Neuromuscular re-education;Patient/family education;Visual/perceptual remediation/compensation    PT Next Visit Plan  Assess dynamic balance (DGI, FGA); progress HEP; work on static and dynamic balance    PT Home Exercise Plan  single limb stance, tandem stance     Consulted and Agree with Plan of Care  Patient       Patient will benefit from skilled therapeutic intervention in order to improve the following deficits and impairments:  Abnormal gait, Decreased activity tolerance, Decreased balance, Decreased coordination, Decreased endurance, Decreased mobility, Decreased safety awareness, Difficulty walking, Decreased strength, Dizziness, Impaired vision/preception, Postural dysfunction  Visit Diagnosis: Other abnormalities of gait and mobility  Muscle weakness (generalized)  History of falling     Problem List Patient Active Problem List   Diagnosis Date Noted  . Peripheral neuropathy, idiopathic 07/11/2017  . Seizure disorder ([place]) 06/12/2017  . Syncope 05/25/2017  . Medicare annual wellness visit, initial 02/24/2016  . Chest pain 04/01/2015  . CAD in native artery 03/10/2015  . Unstable angina pectoris ([place]) 02/23/2015  . Benign essential HTN 10/20/2014  . Chronic anxiety 10/20/2014  . Anxiety, generalized 10/20/2014  . Insomnia, persistent 10/20/2014  . Recurrent major depressive episodes ([place]) 10/20/2014  . Depression, major, single episode, severe ([place]) 10/20/2014  . Combined fat and carbohydrate induced hyperlipemia 10/20/2014  . Restless leg syndrome 10/20/2014  . MDD (major depressive disorder), recurrent episode, moderate ([place]) 10/20/2014  . AF (paroxysmal atrial fibrillation) ([place]) 07/31/2014  . Paroxysmal atrial  fibrillation ([place]) 07/31/2014  . H/O total knee replacement 06/25/2014  . Total knee replacement status 06/10/2014  . H/O malignant neoplasm of breast 05/24/2014  . Restless leg 05/24/2014  . H/O: HTN (hypertension) 05/24/2014  . History of atrial fibrillation 05/24/2014  . CAFL (chronic airflow limitation) ([place]) 05/24/2014  . Vitamin B12 deficiency 05/24/2014  . H/O disease 05/24/2014  . H/O: hypothyroidism 05/24/2014  . H/O gastrointestinal disease 05/24/2014  . [place] anemia 02/16/2014  . Anxiety 11/06/2013  . A-fib ([place]) 11/06/2013  . Clinical depression 11/06/2013  . Acid reflux 11/06/2013  . Heart valve disease 11/06/2013  . Primary cancer of right female breast ([place]) 11/06/2013  . HLD (hyperlipidemia) 11/06/2013  . BP (high blood pressure) 11/06/2013  . Atrial fibrillation ([place]) 11/06/2013  . Chronic obstructive pulmonary disease ([place]) 11/06/2013  . Hormone receptor positive malignant neoplasm of breast ([place]) 11/06/2013  . Adult hypothyroidism 06/29/2010  . Adult BMI 30+ 06/29/2010  . B-complex deficiency 12/05/1995  . Extremity pain 12/05/1995   Harriet Masson, SPT This entire session was performed under direct supervision and direction of a licensed therapist/therapist assistant . I have personally read, edited and approve of the note as written.  Trotter,Margaret PT, DPT 10/03/2017, 11:19 AM  [place] MAIN [facility] SERVICES [address] [place], [place], [address] Phone: [phone]   Fax:  [phone]  Name: Tonya Walton MRN:  [id number] Date of Birth: 1950/07/02

## 2017-10-08 ENCOUNTER — Ambulatory Visit: Payer: PPO | Admitting: Physical Therapy

## 2017-10-08 ENCOUNTER — Encounter: Payer: Self-pay | Admitting: Physical Therapy

## 2017-10-08 DIAGNOSIS — Z9181 History of falling: Secondary | ICD-10-CM

## 2017-10-08 DIAGNOSIS — M6281 Muscle weakness (generalized): Secondary | ICD-10-CM

## 2017-10-08 DIAGNOSIS — R2689 Other abnormalities of gait and mobility: Secondary | ICD-10-CM | POA: Diagnosis not present

## 2017-10-08 NOTE — Therapy (Addendum)
[place] MAIN [facility] SERVICES [address] [place], [place], [address] Phone: [phone]   Fax:  [phone]  Physical Therapy Treatment  Patient Details  Name: Tonya Walton MRN: [id number] Date of Birth: [birth year] Referring Provider: Dr. Manuella Ghazi   Encounter Date: 10/08/2017  PT End of Session - 10/08/17 0851    Visit Number  12    Number of Visits  29    Date for PT Re-Evaluation  10/22/17    Authorization Type  progress note 3/10    PT Start Time  0847    PT Stop Time  0930    PT Time Calculation (min)  43 min    Equipment Utilized During Treatment  Gait belt    Activity Tolerance  Patient tolerated treatment well    Behavior During Therapy  [facility] for tasks assessed/performed       Past Medical History:  Diagnosis Date  . Anxiety   . Arthritis   . Atrial fibrillation, controlled ([place])   . Breast cancer ([place]) 2012   RT LUMPECTOMY  . CHF (congestive heart failure) ([place])   . COPD (chronic obstructive pulmonary disease) ([place])   . Coronary artery disease   . Depression   . Dysrhythmia    Afib  . GERD (gastroesophageal reflux disease)   . Headache   . Hypertension   . Hypothyroidism   . Neuromuscular disorder ([place])    nerve damage in legs  . Personal history of radiation therapy 2012   BREAST CA  . Restless leg syndrome   . Seizures ([place])   . Shortness of breath dyspnea     Past Surgical History:  Procedure Laterality Date  . APPENDECTOMY    . BREAST BIOPSY Left 07/2013   NEG  . BREAST BIOPSY Right 08/22/2016   US guided breast biopsy  . BREAST EXCISIONAL BIOPSY Right 2012   POS  . BREAST LUMPECTOMY     Right breast  . CARDIAC CATHETERIZATION N/A 03/02/2015   Procedure: Left Heart Cath and Coronary Angiography;  Surgeon: Teodoro Spray, MD;  Location: [place] [facility];  Service: Cardiovascular;  Laterality: N/A;  . CATARACT EXTRACTION W/PHACO Right 10/14/2014   Procedure: CATARACT EXTRACTION PHACO AND INTRAOCULAR  LENS PLACEMENT (IOC);  Surgeon: Leandrew Koyanagi, MD;  Location: [place];  Service: Ophthalmology;  Laterality: Right;  . CORONARY ARTERY BYPASS GRAFT N/A 04/01/2015   Procedure: CORONARY ARTERY BYPASS GRAFTING (CABG) x one , using left mammary artery;  Surgeon: Ivin Poot, MD;  Location: [place];  Service: Open Heart Surgery;  Laterality: N/A;  . JOINT REPLACEMENT Right    Total Knee Replacement  . TEE WITHOUT CARDIOVERSION N/A 04/01/2015   Procedure: TRANSESOPHAGEAL ECHOCARDIOGRAM (TEE);  Surgeon: Ivin Poot, MD;  Location: [place];  Service: Open Heart Surgery;  Laterality: N/A;  . TONSILLECTOMY    . TOTAL KNEE ARTHROPLASTY Right 06/10/2014   Procedure: TOTAL KNEE ARTHROPLASTY;  Surgeon: Dereck Leep, MD;  Location: [facility];  Service: Orthopedics;  Laterality: Right;  . TOTAL KNEE ARTHROPLASTY Left 09/02/2014   Procedure: TOTAL KNEE ARTHROPLASTY;  Surgeon: Dereck Leep, MD;  Location: [facility];  Service: Orthopedics;  Laterality: Left;    There were no vitals filed for this visit.  Subjective Assessment - 10/08/17 0847    Subjective  Patient reports she is doing okay; had a stomach bug over the weekend but started feeling better yesterday. She denies any pain or new falls since last visit.  Pertinent History  67 y.o. female referred to physical therapy for imbalance. PMH includes arthritis, A. fib, CHF, COPD, GERD, hypertension, and breast cancer with radiation. Peripheral neuropathy due to vitamin B deficiency with related imbalance. MD recommended cane on regular basis for ambulation. Pt reports 10 seizures in the last 2 and a half months and roughly 10 falls in the last 2 months. Pt reports staggering with ambulation and general loss of balance in prolonged standing. Pt reports feeling weaker on L side; supported by objective strength findings.     Limitations  Walking;Standing    How long can you sit comfortably?  NA but sometimes loses balance when sitting    How  long can you stand comfortably?  loses balance after about 5 min    How long can you walk comfortably?  imbalance often immediately; staggers     Patient Stated Goals  Help with the staggering    Currently in Pain?  No/denies       Treatment Warm up on treadmill with 2 HHA, 2.0 mph x4 min with cues for erect posture and to increase step length and pick up feet high to avoid shuffling steps when walking; HR 111 at immediate end of 4 min   Gait in hallway: -Horizontal head turns x160 ft x3 laps  -Vertical head turns x160 ft -[employer] with PT x160 ft -Dribbling ball while walking forwards x160 ft -UE/LE alternating lifts x160 ft CGA-min A for all activities with VCs for proper technique and sequencing especially with dual-task activities; demonstrated difficulty with coordinating alternating UE/LE lifts while walking with more difficulty with SLS on the RLE   Weaving between #5 cones x3 laps, CGA for safety, VCs for turning completely between the cones; 1 minor LOB noted when turning   Side stepping over cones x2 laps each direction, CGA for safety, VCs for proper technique and sequencing when stepping over cones as well as foot placement  Semi tandem stance with each foot on dynadisk x10 sec holds with each foot in front x2 reps with horizontal head turns with second rep, no UE support, CGA for safety, VCs for utilizing ankles to maintain balance and weight shift   SLS in // bars x10 sec holds each foot x2 reps, CGA for safety, VCs for not resting leg against stance leg when performing SLS  Star stepping to 8 points x1 round with each foot, VCs for sequencing and which point to step to and direction to cross, CGA for safety  Pt reports feeling weak following stomach bug over the weekend and believes she may be slightly dehydrated as well;                       PT Education - 10/08/17 0851    Education Details  exercise technique, gait safety, balance    Person(s)  Educated  Patient    Methods  Explanation;Demonstration;Verbal cues    Comprehension  Verbalized understanding;Returned demonstration;Verbal cues required;Need further instruction       PT Short Term Goals - 09/10/17 0827      PT SHORT TERM GOAL #1   Title  Pt will increase LLE strength to 4/5 to improve functional strength for independent gait, increased standing tolerance and increased ADL ability.    Time  4    Period  Weeks    Status  Achieved      PT SHORT TERM GOAL #2   Title  Pt will decrease 5 times sit-to-stand  time to < 14 sec to demonstrate decreased fall risk and improved functional mobility.     Time  6    Period  Weeks    Status  Partially Met    Target Date  10/22/17      PT SHORT TERM GOAL #3   Title  Patient will report no new falls (except those related to seizures) in the last month to exhibit improved safety awareness and improved balance with ADLs.     Baseline  none in last month    Time  4    Period  Weeks    Status  Achieved        PT Long Term Goals - 09/10/17 3491      PT LONG TERM GOAL #1   Title  Patient will be independent in home exercise program to improve strength/mobility for better functional independence with ADLs.    Time  6    Period  Weeks    Status  On-going    Target Date  10/22/17      PT LONG TERM GOAL #2   Title  Pt will improve Berg Balance Assessment score by 4 points to decrease fall risk in home and community environment.     Baseline  07/16/17: 42/56 (significant fall risk)    Time  8    Period  Weeks    Status  Achieved      PT LONG TERM GOAL #3   Title  Patient will increase 10 meter walk test to >1.1ms as to improve gait speed for better community ambulation and to reduce fall risk.    Baseline  07/16/17: 0.78 m/s    Time  8    Period  Weeks    Status  Achieved      PT LONG TERM GOAL #4   Title  Patient will increase BLE gross strength to 4+/5 as to improve functional strength for independent gait, increased  standing tolerance and increased ADL ability.     Time  6    Period  Weeks    Status  Partially Met    Target Date  10/22/17      PT LONG TERM GOAL #5   Title  Patient will increase dynamic gait index score to >19/24 as to demonstrate reduced fall risk and improved dynamic gait balance for better safety with community/home ambulation.     Time  6    Period  Weeks    Status  New    Target Date  10/22/17            Plan - 10/08/17 1112    Clinical Impression Statement  Patient tolerated therapy session well. Session focused on dynamic gait and pt had greatest difficulty with horizontal head turns and demonstrated greatest amount of swaying. Pt demonstrated increased difficulty with alternating UE/LE lifts particularly with standing on RLE. Pt required CGA for all balance activities with VCs for technique and sequencing of tasks. Pt did demonstrate more sway during session today secondary to feeling weakness and fatigue following stomach bug over the weekend. Pt will continue to benefit from skilled PT intervention for improvements in balance, gait safety, and dual tasking/coordination.    Rehab Potential  Fair    Clinical Impairments Affecting Rehab Potential  (-) seizures, high frequency of falls, co-morbidities (+) motivated, family support    PT Frequency  2x / week    PT Duration  6 weeks    PT Treatment/Interventions  Cryotherapy;Moist Heat;Gait training;Stair  training;Functional mobility training;Therapeutic activities;Therapeutic exercise;Balance training;Neuromuscular re-education;Patient/family education;Visual/perceptual remediation/compensation    PT Next Visit Plan  Assess dynamic balance (DGI, FGA); progress HEP; work on static and dynamic balance    PT Home Exercise Plan  single limb stance, tandem stance     Consulted and Agree with Plan of Care  Patient       Patient will benefit from skilled therapeutic intervention in order to improve the following deficits and  impairments:  Abnormal gait, Decreased activity tolerance, Decreased balance, Decreased coordination, Decreased endurance, Decreased mobility, Decreased safety awareness, Difficulty walking, Decreased strength, Dizziness, Impaired vision/preception, Postural dysfunction  Visit Diagnosis: Other abnormalities of gait and mobility  Muscle weakness (generalized)  History of falling     Problem List Patient Active Problem List   Diagnosis Date Noted  . Peripheral neuropathy, idiopathic 07/11/2017  . Seizure disorder ([place]) 06/12/2017  . Syncope 05/25/2017  . Medicare annual wellness visit, initial 02/24/2016  . Chest pain 04/01/2015  . CAD in native artery 03/10/2015  . Unstable angina pectoris ([place]) 02/23/2015  . Benign essential HTN 10/20/2014  . Chronic anxiety 10/20/2014  . Anxiety, generalized 10/20/2014  . Insomnia, persistent 10/20/2014  . Recurrent major depressive episodes ([place]) 10/20/2014  . Depression, major, single episode, severe ([place]) 10/20/2014  . Combined fat and carbohydrate induced hyperlipemia 10/20/2014  . Restless leg syndrome 10/20/2014  . MDD (major depressive disorder), recurrent episode, moderate ([place]) 10/20/2014  . AF (paroxysmal atrial fibrillation) ([place]) 07/31/2014  . Paroxysmal atrial fibrillation ([place]) 07/31/2014  . H/O total knee replacement 06/25/2014  . Total knee replacement status 06/10/2014  . H/O malignant neoplasm of breast 05/24/2014  . Restless leg 05/24/2014  . H/O: HTN (hypertension) 05/24/2014  . History of atrial fibrillation 05/24/2014  . CAFL (chronic airflow limitation) ([place]) 05/24/2014  . Vitamin B12 deficiency 05/24/2014  . H/O disease 05/24/2014  . H/O: hypothyroidism 05/24/2014  . H/O gastrointestinal disease 05/24/2014  . [place] anemia 02/16/2014  . Anxiety 11/06/2013  . A-fib ([place]) 11/06/2013  . Clinical depression 11/06/2013  . Acid reflux 11/06/2013  . Heart valve disease 11/06/2013  . Primary cancer of right female  breast ([place]) 11/06/2013  . HLD (hyperlipidemia) 11/06/2013  . BP (high blood pressure) 11/06/2013  . Atrial fibrillation ([place]) 11/06/2013  . Chronic obstructive pulmonary disease ([place]) 11/06/2013  . Hormone receptor positive malignant neoplasm of breast ([place]) 11/06/2013  . Adult hypothyroidism 06/29/2010  . Adult BMI 30+ 06/29/2010  . B-complex deficiency 12/05/1995  . Extremity pain 12/05/1995   Harriet Masson, SPT This entire session was performed under direct supervision and direction of a licensed therapist/therapist assistant . I have personally read, edited and approve of the note as written.  Trotter,Margaret PT, DPT 10/08/2017, 1:27 PM  [place] MAIN [facility] SERVICES [address] [place], [place], [address] Phone: [phone]   Fax:  [phone]  Name: Tonya Walton MRN: [id number] Date of Birth: 13-Mar-1950

## 2017-10-10 ENCOUNTER — Ambulatory Visit: Payer: PPO | Admitting: Physical Therapy

## 2017-10-15 ENCOUNTER — Encounter: Payer: Self-pay | Admitting: Physical Therapy

## 2017-10-15 ENCOUNTER — Ambulatory Visit: Payer: PPO | Admitting: Physical Therapy

## 2017-10-15 DIAGNOSIS — R2689 Other abnormalities of gait and mobility: Secondary | ICD-10-CM | POA: Diagnosis not present

## 2017-10-15 DIAGNOSIS — M6281 Muscle weakness (generalized): Secondary | ICD-10-CM

## 2017-10-15 DIAGNOSIS — Z9181 History of falling: Secondary | ICD-10-CM

## 2017-10-15 NOTE — Therapy (Addendum)
[place] [facility]IN [facility] SERVICES [address] [place], [place], [address] Phone: [phone]   Fax:  [phone]  Physical Therapy Treatment  Patient Details  Name: Tonya Walton MRN: [id number] Date of Birth: 01/11/1951 Referring Provider: Dr. Manuella Ghazi   Encounter Date: 10/15/2017  PT End of Session - 10/15/17 0853    Visit Number  13    Number of Visits  29    Date for PT Re-Evaluation  10/22/17    Authorization Type  progress note 4/10    PT Start Time  0846    PT Stop Time  0930    PT Time Calculation (min)  44 min    Equipment Utilized During Treatment  Gait belt    Activity Tolerance  Patient tolerated treatment well    Behavior During Therapy  [facility] for tasks assessed/performed       Past Medical History:  Diagnosis Date  . Anxiety   . Arthritis   . Atrial fibrillation, controlled ([place])   . Breast cancer ([place]) 2012   RT LUMPECTOMY  . CHF (congestive heart failure) ([place])   . COPD (chronic obstructive pulmonary disease) ([place])   . Coronary artery disease   . Depression   . Dysrhythmia    Afib  . GERD (gastroesophageal reflux disease)   . Headache   . Hypertension   . Hypothyroidism   . Neuromuscular disorder ([place])    nerve damage in legs  . Personal history of radiation therapy 2012   BREAST CA  . Restless leg syndrome   . Seizures ([place])   . Shortness of breath dyspnea     Past Surgical History:  Procedure Laterality Date  . APPENDECTOMY    . BREAST BIOPSY Left 07/2013   NEG  . BREAST BIOPSY Right 08/22/2016   US guided breast biopsy  . BREAST EXCISIONAL BIOPSY Right 2012   POS  . BREAST LUMPECTOMY     Right breast  . CARDIAC CATHETERIZATION N/A 03/02/2015   Procedure: Left Heart Cath and Coronary Angiography;  Surgeon: Teodoro Spray, MD;  Location: [place] [facility];  Service: Cardiovascular;  Laterality: N/A;  . CATARACT EXTRACTION W/PHACO Right 10/14/2014   Procedure: CATARACT EXTRACTION PHACO AND INTRAOCULAR  LENS PLACEMENT (IOC);  Surgeon: Leandrew Koyanagi, MD;  Location: [place];  Service: Ophthalmology;  Laterality: Right;  . CORONARY ARTERY BYPASS GRAFT N/A 04/01/2015   Procedure: CORONARY ARTERY BYPASS GRAFTING (CABG) x one , using left mammary artery;  Surgeon: Ivin Poot, MD;  Location: [place];  Service: Open Heart Surgery;  Laterality: N/A;  . JOINT REPLACEMENT Right    Total Knee Replacement  . TEE WITHOUT CARDIOVERSION N/A 04/01/2015   Procedure: TRANSESOPHAGEAL ECHOCARDIOGRAM (TEE);  Surgeon: Ivin Poot, MD;  Location: [place];  Service: Open Heart Surgery;  Laterality: N/A;  . TONSILLECTOMY    . TOTAL KNEE ARTHROPLASTY Right 06/10/2014   Procedure: TOTAL KNEE ARTHROPLASTY;  Surgeon: Dereck Leep, MD;  Location: [facility];  Service: Orthopedics;  Laterality: Right;  . TOTAL KNEE ARTHROPLASTY Left 09/02/2014   Procedure: TOTAL KNEE ARTHROPLASTY;  Surgeon: Dereck Leep, MD;  Location: [facility];  Service: Orthopedics;  Laterality: Left;    There were no vitals filed for this visit.  Subjective Assessment - 10/15/17 0850    Subjective  Patient reports she is doing okay; had to cancel last visit due to emotional issues she was dealing with on Wednesday. Pt reports having significant pain in BLE  6-7/10 over the weekend with some decrease in pain today but still present. Pt denies any falls since last visit but has staggered some but is able to self-correct.     Pertinent History  67 y.o. female referred to physical therapy for imbalance. PMH includes arthritis, A. fib, CHF, COPD, GERD, hypertension, and breast cancer with radiation. Peripheral neuropathy due to vitamin B deficiency with related imbalance. MD recommended cane on regular basis for ambulation. Pt reports 10 seizures in the last 2 and a half months and roughly 10 falls in the last 2 months. Pt reports staggering with ambulation and general loss of balance in prolonged standing. Pt reports feeling weaker on L side;  supported by objective strength findings.     Limitations  Walking;Standing    How long can you sit comfortably?  NA but sometimes loses balance when sitting    How long can you stand comfortably?  loses balance after about 5 min    How long can you walk comfortably?  imbalance often immediately; staggers     Patient Stated Goals  Help with the staggering    Currently in Pain?  Yes    Pain Score  4     Pain Location  Leg    Pain Orientation  Right;Left    Pain Descriptors / Indicators  Aching;Burning    Pain Type  Chronic pain    Pain Onset  More than a month ago    Pain Frequency  Constant    Aggravating Factors   staying up too long, overdoing standing activities     Pain Relieving Factors  rest, exercises/stretching    Effect of Pain on Daily Activities  decreased activity tolerance    Multiple Pain Sites  No       Treatment Warm up on treadmill with 2 HHA,1.21mh x4 min with cues for erect posture and to increase step length and pick up feet high to avoid shuffling steps when walking; HR 95 at immediate end of 4 min  Gait in hallway: -Horizontal head turns x160 ftx2 laps, second lap calling out red/black for cards with head turns   -Vertical head turns x160 ft -BQuest Diagnosticswith PT x160 ft -Dribbling ball while walking forwards x160 ft -UE/LE alternating lifts x160 ft CGA-min Afor all activities with VCs for proper technique and sequencing especially with dual-task activities; demonstrated difficulty with coordinating alternating UE/LE lifts while walkingwith more difficulty with SLS on the RLE  Side stepping with ball tosses to wall x20 ft x2 laps with CGA-min A for safety with VCs for sequencing and technique   Weaving between #5 cones x1 laps, CGA for safety, VCs for turning completely between the cones; 1 cone knocked over on first pass  Weaving between #5 cones x2 laps with alternating toe taps as passing each cone, CGA-min A for safety, VCs for sequencing and  technique, one LOB noted on first lap when tapping cones  Side stepping over cones x1 lap each direction, CGA for safety, VCs for proper technique and sequencing when stepping over cones as well as foot placement  SLS in // bars x10 sec holds each foot x2 reps, faded UE support, CGA for safety, VCs for not resting leg against stance leg when performing SLS  Star stepping to 8 points x2 rounds with each foot, VCs for sequencing and which point to step to and direction to cross, CGA-min A for safety, several LOB noted with min A required to regain balance  Pt reported  feeling some increased ache in legs with tiredness noted; performed seated LAQs x15 reps each leg in order to work on decreasing discomfort in [place]                       PT Education - 10/15/17 0852    Education Details  exercise technique, gait safety, balance    Person(s) Educated  Patient    Methods  Explanation;Demonstration;Verbal cues    Comprehension  Verbalized understanding;Returned demonstration;Verbal cues required;Need further instruction       PT Short Term Goals - 09/10/17 0827      PT SHORT TERM GOAL #1   Title  Pt will increase LLE strength to 4/5 to improve functional strength for independent gait, increased standing tolerance and increased ADL ability.    Time  4    Period  Weeks    Status  Achieved      PT SHORT TERM GOAL #2   Title  Pt will decrease 5 times sit-to-stand time to < 14 sec to demonstrate decreased fall risk and improved functional mobility.     Time  6    Period  Weeks    Status  Partially Met    Target Date  10/22/17      PT SHORT TERM GOAL #3   Title  Patient will report no new falls (except those related to seizures) in the last month to exhibit improved safety awareness and improved balance with ADLs.     Baseline  none in last month    Time  4    Period  Weeks    Status  Achieved        PT Long Term Goals - 09/10/17 1443      PT LONG TERM GOAL #1    Title  Patient will be independent in home exercise program to improve strength/mobility for better functional independence with ADLs.    Time  6    Period  Weeks    Status  On-going    Target Date  10/22/17      PT LONG TERM GOAL #2   Title  Pt will improve Berg Balance Assessment score by 4 points to decrease fall risk in home and community environment.     Baseline  07/16/17: 42/56 (significant fall risk)    Time  8    Period  Weeks    Status  Achieved      PT LONG TERM GOAL #3   Title  Patient will increase 10 meter walk test to >1.13ms as to improve gait speed for better community ambulation and to reduce fall risk.    Baseline  07/16/17: 0.78 m/s    Time  8    Period  Weeks    Status  Achieved      PT LONG TERM GOAL #4   Title  Patient will increase BLE gross strength to 4+/5 as to improve functional strength for independent gait, increased standing tolerance and increased ADL ability.     Time  6    Period  Weeks    Status  Partially Met    Target Date  10/22/17      PT LONG TERM GOAL #5   Title  Patient will increase dynamic gait index score to >19/24 as to demonstrate reduced fall risk and improved dynamic gait balance for better safety with community/home ambulation.     Time  6    Period  Weeks    Status  New    Target Date  10/22/17            Plan - 10/15/17 1132    Clinical Impression Statement  Patient tolerated therapy session well. Pt demonstrated decreased activity tolerance related to LE pain and discomfort; required more seated rest breaks secondary to pain. Pt performed dynamic gait activities with decreased staggering and demonstrated improved coordination with UE/LE lifts with walking. Pt required CGA-min A for all balance activities with min A to correct minor LOB; VCs for proper technique and sequencing of tasks. Pt demonstrated multiple LOB when performing star stepping at end of session due to slight increase in BLE pain secondary to fatigue. Pt  performed seated LAQs without weight and verbalized feeling improvements in pain and discomfort. Pt will continue to benefit from skilled PT intervention for improvements in balance, gait safety, and dual tasking/coordination.     Rehab Potential  Fair    Clinical Impairments Affecting Rehab Potential  (-) seizures, high frequency of falls, co-morbidities (+) motivated, family support    PT Frequency  2x / week    PT Duration  6 weeks    PT Treatment/Interventions  Cryotherapy;Moist Heat;Gait training;Stair training;Functional mobility training;Therapeutic activities;Therapeutic exercise;Balance training;Neuromuscular re-education;Patient/family education;Visual/perceptual remediation/compensation    PT Next Visit Plan  Assess dynamic balance (DGI, FGA); progress HEP; work on static and dynamic balance    PT Home Exercise Plan  single limb stance, tandem stance     Consulted and Agree with Plan of Care  Patient       Patient will benefit from skilled therapeutic intervention in order to improve the following deficits and impairments:  Abnormal gait, Decreased activity tolerance, Decreased balance, Decreased coordination, Decreased endurance, Decreased mobility, Decreased safety awareness, Difficulty walking, Decreased strength, Dizziness, Impaired vision/preception, Postural dysfunction  Visit Diagnosis: Other abnormalities of gait and mobility  Muscle weakness (generalized)  History of falling     Problem List Patient Active Problem List   Diagnosis Date Noted  . Peripheral neuropathy, idiopathic 07/11/2017  . Seizure disorder ([place]) 06/12/2017  . Syncope 05/25/2017  . Medicare annual wellness visit, initial 02/24/2016  . Chest pain 04/01/2015  . CAD in native artery 03/10/2015  . Unstable angina pectoris ([place]) 02/23/2015  . Benign essential HTN 10/20/2014  . Chronic anxiety 10/20/2014  . Anxiety, generalized 10/20/2014  . Insomnia, persistent 10/20/2014  . Recurrent major  depressive episodes ([place]) 10/20/2014  . Depression, major, single episode, severe ([place]) 10/20/2014  . Combined fat and carbohydrate induced hyperlipemia 10/20/2014  . Restless leg syndrome 10/20/2014  . MDD (major depressive disorder), recurrent episode, moderate ([place]) 10/20/2014  . AF (paroxysmal atrial fibrillation) ([place]) 07/31/2014  . Paroxysmal atrial fibrillation ([place]) 07/31/2014  . H/O total knee replacement 06/25/2014  . Total knee replacement status 06/10/2014  . H/O malignant neoplasm of breast 05/24/2014  . Restless leg 05/24/2014  . H/O: HTN (hypertension) 05/24/2014  . History of atrial fibrillation 05/24/2014  . CAFL (chronic airflow limitation) ([place]) 05/24/2014  . Vitamin B12 deficiency 05/24/2014  . H/O disease 05/24/2014  . H/O: hypothyroidism 05/24/2014  . H/O gastrointestinal disease 05/24/2014  . [place] anemia 02/16/2014  . Anxiety 11/06/2013  . A-fib ([place]) 11/06/2013  . Clinical depression 11/06/2013  . Acid reflux 11/06/2013  . Heart valve disease 11/06/2013  . Primary cancer of right female breast ([place]) 11/06/2013  . HLD (hyperlipidemia) 11/06/2013  . BP (high blood pressure) 11/06/2013  . Atrial fibrillation ([place]) 11/06/2013  . Chronic obstructive pulmonary disease ([place]) 11/06/2013  .  Hormone receptor positive malignant neoplasm of breast ([place]) 11/06/2013  . Adult hypothyroidism 06/29/2010  . Adult BMI 30+ 06/29/2010  . B-complex deficiency 12/05/1995  . Extremity pain 12/05/1995   Harriet Masson, SPT This entire session was performed under direct supervision and direction of a licensed therapist/therapist assistant . I have personally read, edited and approve of the note as written.  Trotter,Margaret PT, DPT 10/15/2017, 1:43 PM  [place] MAIN [facility] SERVICES [address] [place], [place], [address] Phone: [phone]   Fax:  [phone]  Name: ROBERTINE KIPPER MRN: [id number] Date of Birth: 07-10-1950

## 2017-10-17 ENCOUNTER — Ambulatory Visit: Payer: PPO | Admitting: Physical Therapy

## 2017-10-22 ENCOUNTER — Ambulatory Visit: Payer: PPO | Admitting: Physical Therapy

## 2017-10-23 DIAGNOSIS — K210 Gastro-esophageal reflux disease with esophagitis: Secondary | ICD-10-CM | POA: Diagnosis not present

## 2017-10-24 ENCOUNTER — Ambulatory Visit: Payer: PPO | Admitting: Physical Therapy

## 2017-10-30 ENCOUNTER — Encounter: Payer: Self-pay | Admitting: Physical Therapy

## 2017-10-30 ENCOUNTER — Ambulatory Visit: Payer: PPO | Attending: Neurology | Admitting: Physical Therapy

## 2017-10-30 DIAGNOSIS — M6281 Muscle weakness (generalized): Secondary | ICD-10-CM | POA: Diagnosis not present

## 2017-10-30 DIAGNOSIS — Z9181 History of falling: Secondary | ICD-10-CM | POA: Diagnosis not present

## 2017-10-30 DIAGNOSIS — R2689 Other abnormalities of gait and mobility: Secondary | ICD-10-CM | POA: Insufficient documentation

## 2017-10-30 NOTE — Patient Instructions (Signed)
AMBULATION: Treadmill    Walk on treadmill. Set at _2.0__ mph. Walk _20+__ minutes per set, _3__ days per week Hold rails.  Copyright  VHI. All rights reserved.  Strength, Endurance: Stationary Bike - Sitting    Pedal forward or backward. Adjust seat so leg is nearly straight when down. Make sure that bike has a back to it.  Do __20__ minutes per day.  http://cc.exer.us/36    For all weight machines: Add _5__ lbs when you achieve _15__ repetitions without difficulty.  Copyright  VHI. All rights reserved.  HIP / KNEE: Extension (Hip Sled)    Place feet shoulder-width apart. Push down into heels to straighten knees and hips. Do not hyperextend or lock knees. Hold __2_ seconds. Use _90-120__ lbs. _12-15__ reps per set, _2__ sets per day, __3_ days per week   Copyright  VHI. All rights reserved.  Heel Raise - Incline (Machine)    Ankles flexed and calves stretched, (keeping knees straight) press toes forward as far as possible. 90-120#, do 12-15 repetitions Do __2__ sets.  http://st.exer.us/148   Copyright  VHI. All rights reserved.  Leg Abduction: Sitting (Machine)    Move legs outward and slowly return to start. Start at 40-50#, do sets of 12-15 repetitions  http://st.exer.us/354   Copyright  VHI. All rights reserved.  Leg Adduction: Sitting (Machine)    Legs separated, move legs together and slowly return to start. Start at about 40-50# Do 2 sets of 12-15 reps http://st.exer.us/362   Copyright  VHI. All rights reserved.  Knee Flexion: Hamstring Drop (Eccentric) - Seated (Weight Machine)    Sit on machine. Pull feet back until knees are at 90. Slowly release legs for 3-5 seconds. Start at 35# _12-15__ reps per set, _2__ sets per day, __3_ days per week.   http://ecce.exer.us/108   Copyright  VHI. All rights reserved.  Extension: Single Leg (Machine)    Straighten leg to locked knee position, keeping foot flexed toward knee. DO VERY LIGHT  WEIGHT- maybe 10# or less- (the more weight the more pressure on the knee) Do __2__ sets. Complete _15___ repetitions.  http://st.exer.us/342   Copyright  VHI. All rights reserved.

## 2017-10-30 NOTE — Therapy (Signed)
[place] MAIN [facility] SERVICES [address] [place], [place], [address] Phone: [phone]   Fax:  [phone]  Physical Therapy Treatment/Discharge Summary  Patient Details  Name: Tonya Walton MRN: [id number] Date of Birth: 1950-03-14 Referring Provider (PT): Dr. Manuella Ghazi   Encounter Date: 10/30/2017  PT End of Session - 10/30/17 1626    Visit Number  13    Number of Visits  29    Date for PT Re-Evaluation  10/30/17    PT Start Time  1546    PT Stop Time  1625    PT Time Calculation (min)  39 min    Equipment Utilized During Treatment  Gait belt    Activity Tolerance  Patient tolerated treatment well    Behavior During Therapy  [facility] for tasks assessed/performed       Past Medical History:  Diagnosis Date  . Anxiety   . Arthritis   . Atrial fibrillation, controlled ([place])   . Breast cancer ([place]) 2012   RT LUMPECTOMY  . CHF (congestive heart failure) ([place])   . COPD (chronic obstructive pulmonary disease) ([place])   . Coronary artery disease   . Depression   . Dysrhythmia    Afib  . GERD (gastroesophageal reflux disease)   . Headache   . Hypertension   . Hypothyroidism   . Neuromuscular disorder ([place])    nerve damage in legs  . Personal history of radiation therapy 2012   BREAST CA  . Restless leg syndrome   . Seizures ([place])   . Shortness of breath dyspnea     Past Surgical History:  Procedure Laterality Date  . APPENDECTOMY    . BREAST BIOPSY Left 07/2013   NEG  . BREAST BIOPSY Right 08/22/2016   US guided breast biopsy  . BREAST EXCISIONAL BIOPSY Right 2012   POS  . BREAST LUMPECTOMY     Right breast  . CARDIAC CATHETERIZATION N/A 03/02/2015   Procedure: Left Heart Cath and Coronary Angiography;  Surgeon: Teodoro Spray, MD;  Location: [place] CV LAB;  Service: Cardiovascular;  Laterality: N/A;  . CATARACT EXTRACTION W/PHACO Right 10/14/2014   Procedure: CATARACT EXTRACTION PHACO AND INTRAOCULAR LENS PLACEMENT  (IOC);  Surgeon: Leandrew Koyanagi, MD;  Location: [place];  Service: Ophthalmology;  Laterality: Right;  . CORONARY ARTERY BYPASS GRAFT N/A 04/01/2015   Procedure: CORONARY ARTERY BYPASS GRAFTING (CABG) x one , using left mammary artery;  Surgeon: Ivin Poot, MD;  Location: [place];  Service: Open Heart Surgery;  Laterality: N/A;  . JOINT REPLACEMENT Right    Total Knee Replacement  . TEE WITHOUT CARDIOVERSION N/A 04/01/2015   Procedure: TRANSESOPHAGEAL ECHOCARDIOGRAM (TEE);  Surgeon: Ivin Poot, MD;  Location: [place];  Service: Open Heart Surgery;  Laterality: N/A;  . TONSILLECTOMY    . TOTAL KNEE ARTHROPLASTY Right 06/10/2014   Procedure: TOTAL KNEE ARTHROPLASTY;  Surgeon: Dereck Leep, MD;  Location: [facility] ORS;  Service: Orthopedics;  Laterality: Right;  . TOTAL KNEE ARTHROPLASTY Left 09/02/2014   Procedure: TOTAL KNEE ARTHROPLASTY;  Surgeon: Dereck Leep, MD;  Location: [facility] ORS;  Service: Orthopedics;  Laterality: Left;    There were no vitals filed for this visit.  Subjective Assessment - 10/30/17 1625    Subjective  Patient reports doing really well; She denies any pain; Reports adherence to HEP; Patient denies any new falls and denies any unsteadiness;     Pertinent History  67 y.o. female referred to physical  therapy for imbalance. PMH includes arthritis, A. fib, CHF, COPD, GERD, hypertension, and breast cancer with radiation. Peripheral neuropathy due to vitamin B deficiency with related imbalance. MD recommended cane on regular basis for ambulation. Pt reports 10 seizures in the last 2 and a half months and roughly 10 falls in the last 2 months. Pt reports staggering with ambulation and general loss of balance in prolonged standing. Pt reports feeling weaker on L side; supported by objective strength findings.     Limitations  Walking;Standing    How long can you sit comfortably?  NA but sometimes loses balance when sitting    How long can you stand comfortably?   loses balance after about 5 min    How long can you walk comfortably?  imbalance often immediately; staggers     Patient Stated Goals  Help with the staggering    Currently in Pain?  No/denies    Multiple Pain Sites  No         [facility] PT Assessment - 10/30/17 0001      Strength   Right Hip Flexion  4+/5    Left Hip Flexion  4+/5    Right Knee Flexion  4+/5    Right Knee Extension  4+/5    Left Knee Flexion  4+/5    Left Knee Extension  4+/5    Right Ankle Dorsiflexion  4+/5    Left Ankle Dorsiflexion  4+/5      Standardized Balance Assessment   Five times sit to stand comments   12 sec (low fall risk) improved from 16 sec on 09/10/17      Dynamic Gait Index   Level Surface  Normal    Change in Gait Speed  Normal    Gait with Horizontal Head Turns  Normal    Gait with Vertical Head Turns  Normal    Gait and Pivot Turn  Normal    Step Over Obstacle  Normal    Step Around Obstacles  Normal    Steps  Mild Impairment    Total Score  23    DGI comment:  low fall risk, improved from 18/24 on 09/10/17       TREATMENT: Warm up on Nustep, level 2, BUE/BLE x2 min with history intake and instruction to increase steps per minute to >50 for cardiovascular warm-up;   PT assessed strength and balance with instruction in 5 times sit<>Stand, DGI and MMT; see above; She did require min VCs for correct activity technique; exhibits good safety awareness with balance testing;  Patient expressed that she plans to go back to independent gym exercise. PT provided patient with written instruction in safe gym exercise to use with written instruction on how to advance strengthening and how to safely do cardio exercise. Hamstring curl, BLE plate #5 K27 with min VCs for proper set up and to avoid lumbar extension for less back discomfort; Patient able to exhibit good safety awareness and positioning ;  Instructed patient to avoid leg extension to reduce knee discomfort; Also recommended patient only use  recumbent bike with a back rather than a bike without a back to avoid back discomfort; Patient verbalized understanding; reports feeling comfortable transitioning to gym equipment;                     PT Education - 10/30/17 1625    Education Details  progress towards goals, plan of care; HEP advanced;     Person(s) Educated  Patient  Methods  Explanation;Verbal cues;Handout    Comprehension  Verbalized understanding;Returned demonstration;Verbal cues required       PT Short Term Goals - 10/30/17 1604      PT SHORT TERM GOAL #1   Title  Pt will increase LLE strength to 4/5 to improve functional strength for independent gait, increased standing tolerance and increased ADL ability.    Time  4    Period  Weeks    Status  Achieved      PT SHORT TERM GOAL #2   Title  Pt will decrease 5 times sit-to-stand time to < 14 sec to demonstrate decreased fall risk and improved functional mobility.     Time  6    Period  Weeks    Status  Achieved      PT SHORT TERM GOAL #3   Title  Patient will report no new falls (except those related to seizures) in the last month to exhibit improved safety awareness and improved balance with ADLs.     Baseline  none in last month    Time  4    Period  Weeks    Status  Achieved        PT Long Term Goals - 10/30/17 1605      PT LONG TERM GOAL #1   Title  Patient will be independent in home exercise program to improve strength/mobility for better functional independence with ADLs.    Time  6    Period  Weeks    Status  Achieved      PT LONG TERM GOAL #2   Title  Pt will improve Berg Balance Assessment score by 4 points to decrease fall risk in home and community environment.     Baseline  07/16/17: 42/56 (significant fall risk)    Time  8    Period  Weeks    Status  Achieved      PT LONG TERM GOAL #3   Title  Patient will increase 10 meter walk test to >1.50ms as to improve gait speed for better community ambulation and to  reduce fall risk.    Baseline  07/16/17: 0.78 m/s    Time  8    Period  Weeks    Status  Achieved      PT LONG TERM GOAL #4   Title  Patient will increase BLE gross strength to 4+/5 as to improve functional strength for independent gait, increased standing tolerance and increased ADL ability.     Time  6    Period  Weeks    Status  Achieved      PT LONG TERM GOAL #5   Title  Patient will increase dynamic gait index score to >19/24 as to demonstrate reduced fall risk and improved dynamic gait balance for better safety with community/home ambulation.     Time  6    Period  Weeks    Status  Achieved            Plan - 10/30/17 1627    Clinical Impression Statement  Patient is doing well; Instructed patient in outcome measures. Patient has met all goals, exhibiting improved balance and strength. She reports independence in HEP. Patient plans to transition to independent gym program. Provided patient with written HEP for gym exercise with instruction in safe equipment to use. Patient verbalized and demonstrated understanding. She is appropriate for discharge from PT at this time as patient has met goals and demonstrates low fall risk;  Rehab Potential  Fair    Clinical Impairments Affecting Rehab Potential  (-) seizures, high frequency of falls, co-morbidities (+) motivated, family support    PT Frequency  2x / week    PT Duration  6 weeks    PT Treatment/Interventions  Cryotherapy;Moist Heat;Gait training;Stair training;Functional mobility training;Therapeutic activities;Therapeutic exercise;Balance training;Neuromuscular re-education;Patient/family education;Visual/perceptual remediation/compensation    PT Next Visit Plan  Assess dynamic balance (DGI, FGA); progress HEP; work on static and dynamic balance    PT Home Exercise Plan  single limb stance, tandem stance     Consulted and Agree with Plan of Care  Patient       Patient will benefit from skilled therapeutic intervention in  order to improve the following deficits and impairments:  Abnormal gait, Decreased activity tolerance, Decreased balance, Decreased coordination, Decreased endurance, Decreased mobility, Decreased safety awareness, Difficulty walking, Decreased strength, Dizziness, Impaired vision/preception, Postural dysfunction  Visit Diagnosis: Other abnormalities of gait and mobility  Muscle weakness (generalized)  History of falling     Problem List Patient Active Problem List   Diagnosis Date Noted  . Peripheral neuropathy, idiopathic 07/11/2017  . Seizure disorder ([place]) 06/12/2017  . Syncope 05/25/2017  . Medicare annual wellness visit, initial 02/24/2016  . Chest pain 04/01/2015  . CAD in native artery 03/10/2015  . Unstable angina pectoris ([place]) 02/23/2015  . Benign essential HTN 10/20/2014  . Chronic anxiety 10/20/2014  . Anxiety, generalized 10/20/2014  . Insomnia, persistent 10/20/2014  . Recurrent major depressive episodes ([place]) 10/20/2014  . Depression, major, single episode, severe ([place]) 10/20/2014  . Combined fat and carbohydrate induced hyperlipemia 10/20/2014  . Restless leg syndrome 10/20/2014  . MDD (major depressive disorder), recurrent episode, moderate ([place]) 10/20/2014  . AF (paroxysmal atrial fibrillation) ([place]) 07/31/2014  . Paroxysmal atrial fibrillation ([place]) 07/31/2014  . H/O total knee replacement 06/25/2014  . Total knee replacement status 06/10/2014  . H/O malignant neoplasm of breast 05/24/2014  . Restless leg 05/24/2014  . H/O: HTN (hypertension) 05/24/2014  . History of atrial fibrillation 05/24/2014  . CAFL (chronic airflow limitation) ([place]) 05/24/2014  . Vitamin B12 deficiency 05/24/2014  . H/O disease 05/24/2014  . H/O: hypothyroidism 05/24/2014  . H/O gastrointestinal disease 05/24/2014  . [place]ison anemia 02/16/2014  . Anxiety 11/06/2013  . A-fib ([place]) 11/06/2013  . Clinical depression 11/06/2013  . Acid reflux 11/06/2013  . Heart valve disease  11/06/2013  . Primary cancer of right female breast ([place]) 11/06/2013  . HLD (hyperlipidemia) 11/06/2013  . BP (high blood pressure) 11/06/2013  . Atrial fibrillation ([place]) 11/06/2013  . Chronic obstructive pulmonary disease ([place]) 11/06/2013  . Hormone receptor positive malignant neoplasm of breast ([place]) 11/06/2013  . Adult hypothyroidism 06/29/2010  . Adult BMI 30+ 06/29/2010  . B-complex deficiency 12/05/1995  . Extremity pain 12/05/1995    Anacleto Batterman PT, DPT 10/30/2017, 4:29 PM  [place] MAIN [facility] SERVICES [address] [place], [place], [address] Phone: [phone]   Fax:  [phone]  Name: YONA KOSEK MRN: [id number] Date of Birth: Jun 29, 1950

## 2017-11-01 ENCOUNTER — Ambulatory Visit: Payer: PPO | Admitting: Physical Therapy

## 2017-11-01 IMAGING — CT CT HEAD W/O CM
1 series · 16 of 30 positions shown, 20 images · non-contrast
Comparison: None; correlation MR brain 08/21/2014

CLINICAL DATA: Frontal headaches for 4 days accompanied by
intermittent nausea and dizziness, history breast cancer, CHF,
hypertension, COPD, atrial fibrillation, former smoker

EXAM:
CT HEAD WITHOUT CONTRAST
TECHNIQUE: Contiguous axial images were obtained from the base of the skull
through the vertex without intravenous contrast.

[Series 2: head wo · axial · 0.41mm/px · z∈[-160,-24]mm · 16 of 31 slices shown, 20 images]
[im 2/31  brain]
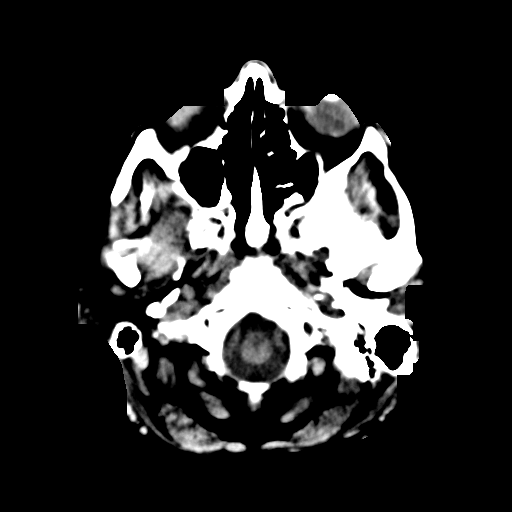
[im 2/31  bone]
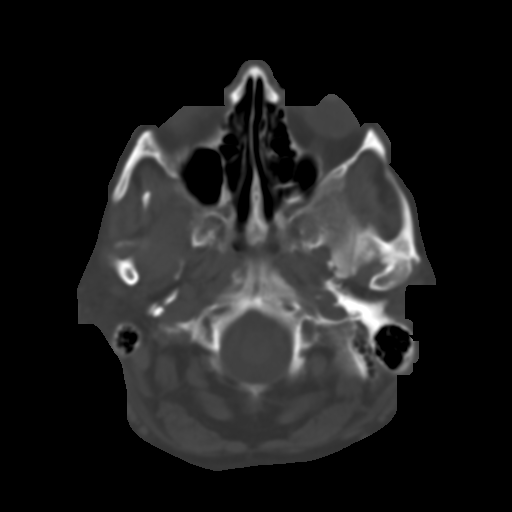
[im 4/31  brain]
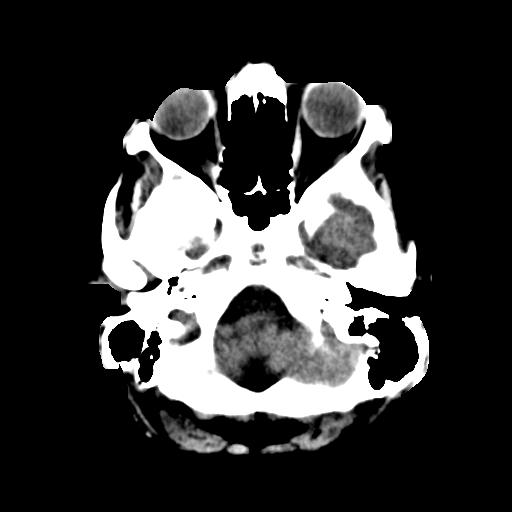
[im 6/31  brain]
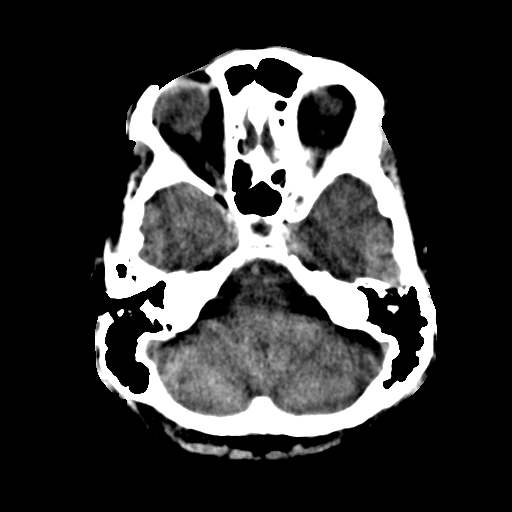
[im 8/31  brain]
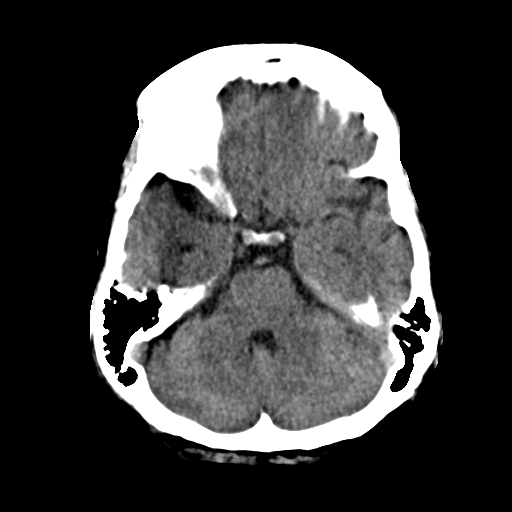
[im 9/31  brain]
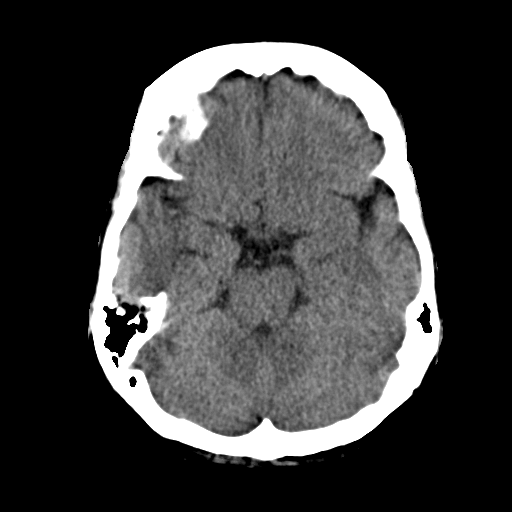
[im 9/31  bone]
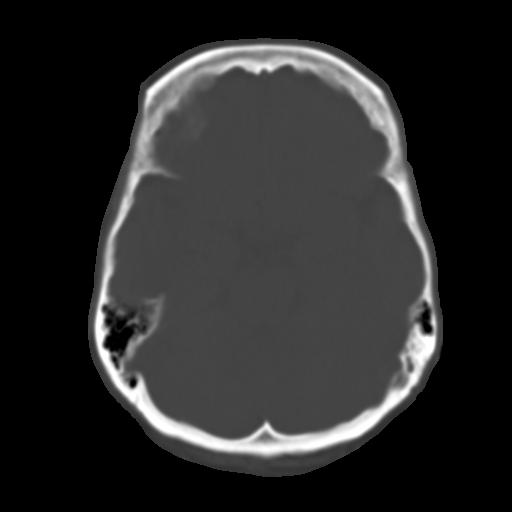
[im 11/31  brain]
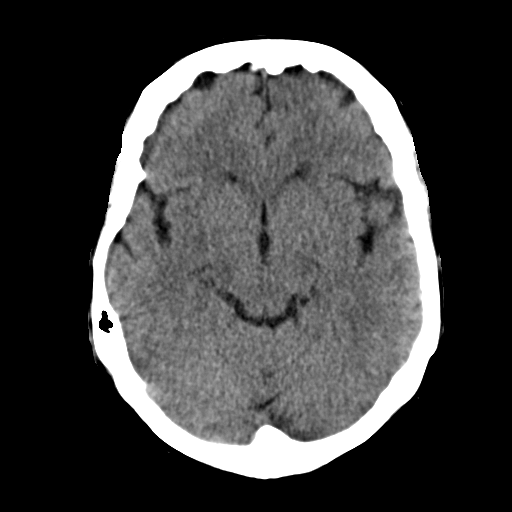
[im 13/31  brain]
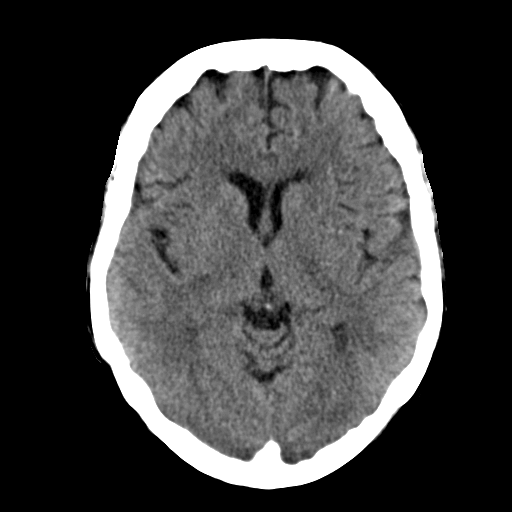
[im 15/31  brain]
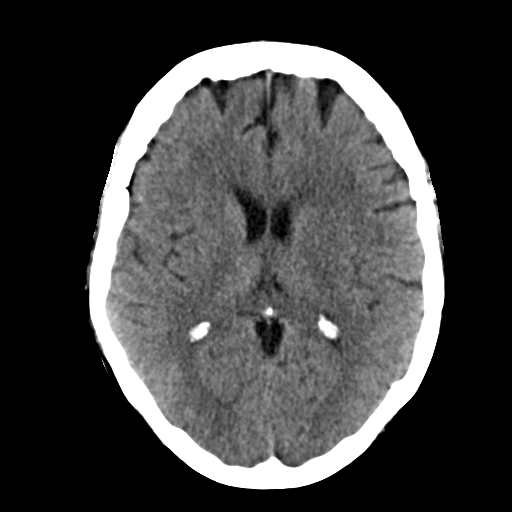
[im 16/31  brain]
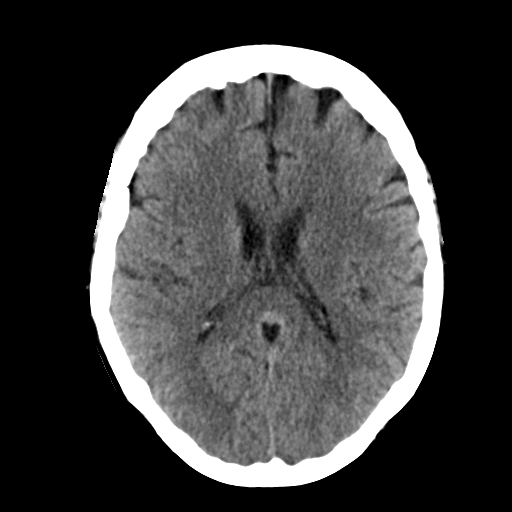
[im 16/31  bone]
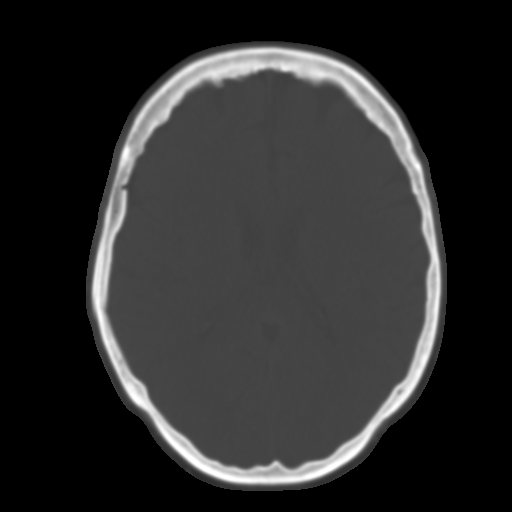
[im 18/31  brain]
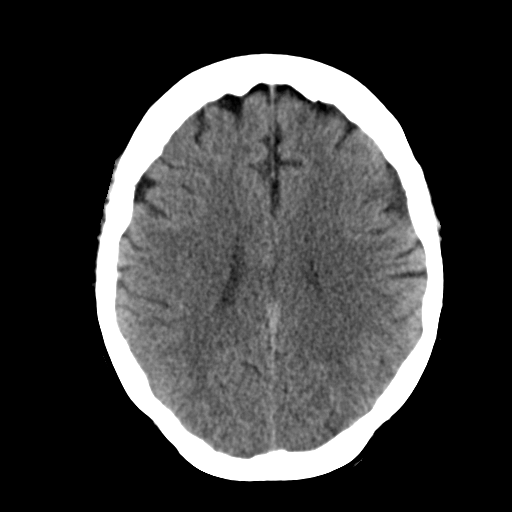
[im 20/31  brain]
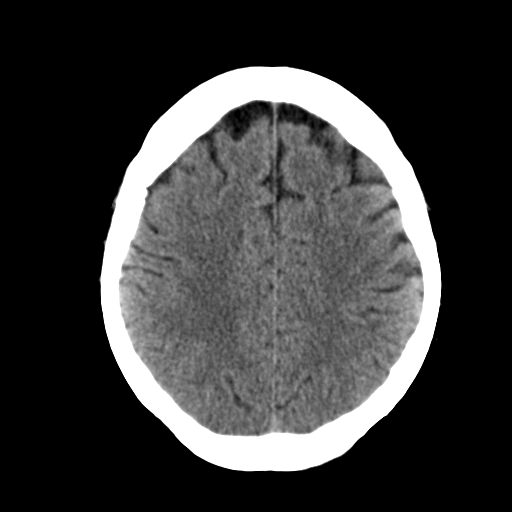
[im 22/31  brain]
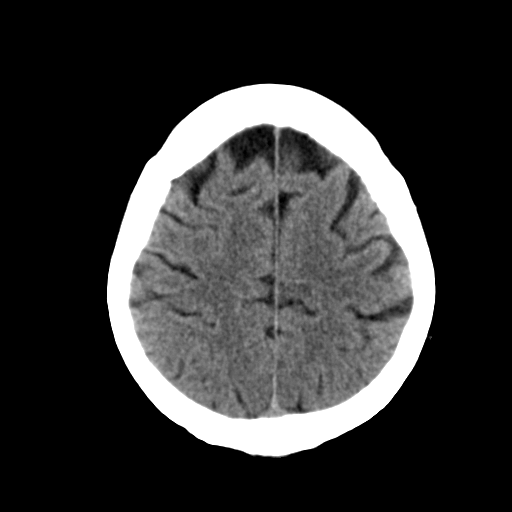
[im 23/31  brain]
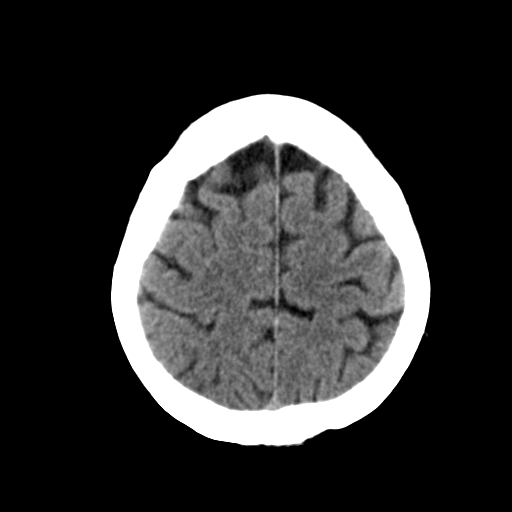
[im 23/31  bone]
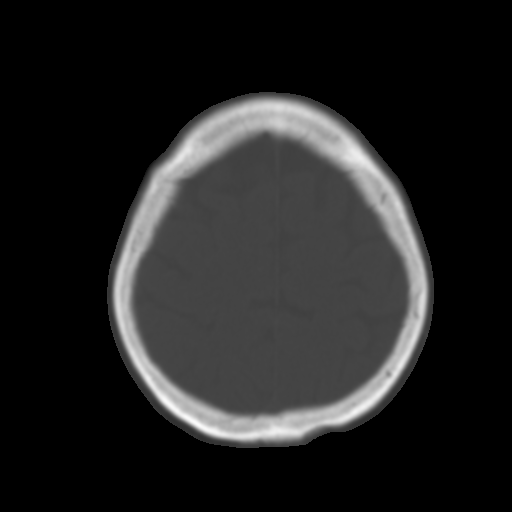
[im 25/31  brain]
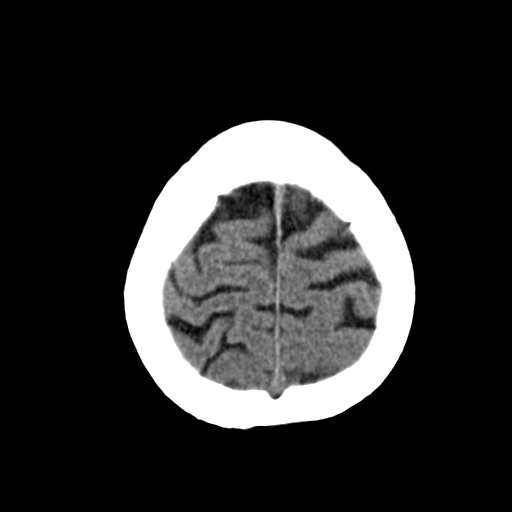
[im 27/31  brain]
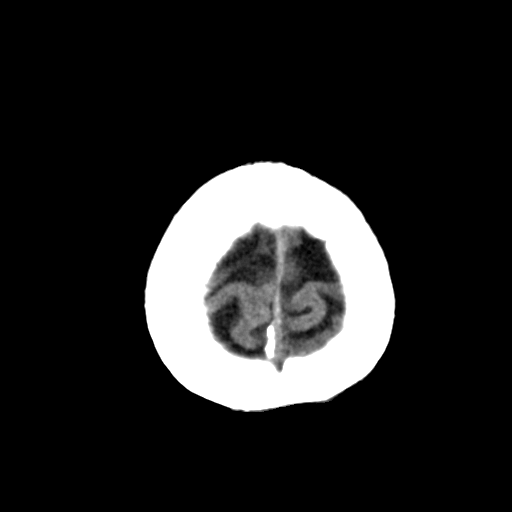
[im 29/31  brain]
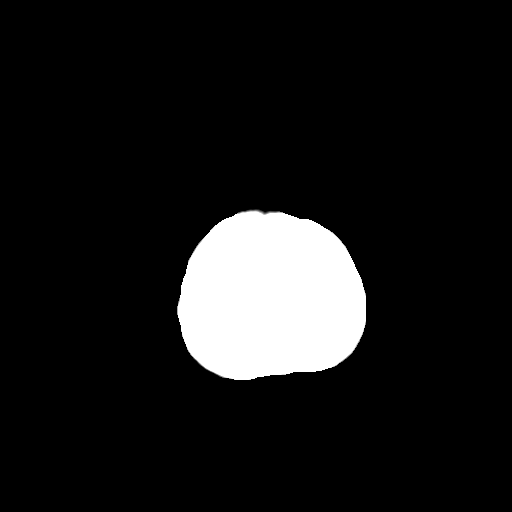

[16 of 30 positions shown; findings below may reference images not displayed]

FINDINGS: Minimal atrophy.

Normal ventricular morphology.

No midline shift or mass effect.

Otherwise normal appearance of brain parenchyma.

No intracranial hemorrhage, mass lesion or evidence acute
infarction.

No extra-axial fluid collections.

Visualized paranasal sinuses and mastoid air cells clear.

No acute osseous findings.
IMPRESSION: No acute intracranial abnormalities.

## 2017-11-04 NOTE — Progress Notes (Signed)
[place]  [phone] [phone]  ID: Tonya Walton OB: 1950-08-11  MR#: [id number]  MWN#:[id number]  Patient Care Team: Rusty Aus, MD as PCP - General (Internal Medicine)  CHIEF COMPLAINT: Stage Ia ER/PR positive adenocarcinoma of the right breast, unspecified location.  INTERVAL HISTORY: Patient returns to clinic today for routine yearly follow-up.  She has noticed a "knot" on her right breast that she feels is decreasing in size.  She otherwise feels well and is asymptomatic.  She has no neurologic complaints. She denies any recent fevers or illnesses. She has a good appetite and denies weight loss. She has no chest pain or shortness of breath. She denies any nausea, vomiting, constipation, or diarrhea. She has no urinary complaints.  Patient feels at her baseline and offers no specific complaints today.  REVIEW OF SYSTEMS:   Review of Systems  Constitutional: Negative.  Negative for fever, malaise/fatigue and weight loss.  Respiratory: Negative.  Negative for cough and shortness of breath.   Cardiovascular: Negative.  Negative for chest pain and leg swelling.  Gastrointestinal: Negative.  Negative for abdominal pain.  Genitourinary: Negative.  Negative for dysuria.  Musculoskeletal: Negative.  Negative for back pain.  Skin: Negative.  Negative for rash.  Neurological: Negative.  Negative for focal weakness, weakness and headaches.  Psychiatric/Behavioral: Negative.  The patient is not nervous/anxious.     As per HPI. Otherwise, a complete review of systems is negative.  PAST MEDICAL HISTORY: Past Medical History:  Diagnosis Date  . Anxiety   . Arthritis   . Atrial fibrillation, controlled ([place])   . Breast cancer ([place]) 2012   RT LUMPECTOMY  . CHF (congestive heart failure) ([place])   . COPD (chronic obstructive pulmonary disease) ([place])   . Coronary artery disease   . Depression   . Dysrhythmia    Afib  . GERD (gastroesophageal  reflux disease)   . Headache   . Hypertension   . Hypothyroidism   . Neuromuscular disorder ([place])    nerve damage in legs  . Personal history of radiation therapy 2012   BREAST CA  . Restless leg syndrome   . Seizures ([place])   . Shortness of breath dyspnea     PAST SURGICAL HISTORY: Past Surgical History:  Procedure Laterality Date  . APPENDECTOMY    . BREAST BIOPSY Left 07/2013   NEG  . BREAST BIOPSY Right 08/22/2016   US guided breast biopsy  . BREAST EXCISIONAL BIOPSY Right 2012   POS  . BREAST LUMPECTOMY     Right breast  . CARDIAC CATHETERIZATION N/A 03/02/2015   Procedure: Left Heart Cath and Coronary Angiography;  Surgeon: Teodoro Spray, MD;  Location: [place] CV LAB;  Service: Cardiovascular;  Laterality: N/A;  . CATARACT EXTRACTION W/PHACO Right 10/14/2014   Procedure: CATARACT EXTRACTION PHACO AND INTRAOCULAR LENS PLACEMENT (IOC);  Surgeon: Leandrew Koyanagi, MD;  Location: [place];  Service: Ophthalmology;  Laterality: Right;  . CORONARY ARTERY BYPASS GRAFT N/A 04/01/2015   Procedure: CORONARY ARTERY BYPASS GRAFTING (CABG) x one , using left mammary artery;  Surgeon: Ivin Poot, MD;  Location: [place];  Service: Open Heart Surgery;  Laterality: N/A;  . JOINT REPLACEMENT Right    Total Knee Replacement  . TEE WITHOUT CARDIOVERSION N/A 04/01/2015   Procedure: TRANSESOPHAGEAL ECHOCARDIOGRAM (TEE);  Surgeon: Ivin Poot, MD;  Location: [place];  Service: Open Heart Surgery;  Laterality: N/A;  . TONSILLECTOMY    . TOTAL KNEE ARTHROPLASTY  Right 06/10/2014   Procedure: TOTAL KNEE ARTHROPLASTY;  Surgeon: Dereck Leep, MD;  Location: [facility];  Service: Orthopedics;  Laterality: Right;  . TOTAL KNEE ARTHROPLASTY Left 09/02/2014   Procedure: TOTAL KNEE ARTHROPLASTY;  Surgeon: Dereck Leep, MD;  Location: [facility];  Service: Orthopedics;  Laterality: Left;    FAMILY HISTORY: Family History  Problem Relation Age of Onset  . Leukemia Mother   . Bone  cancer Father   . Heart disease Brother   . Heart disease Brother   . Diabetes Brother   . Hypertension Brother   . Diabetes Brother   . Hypertension Brother   . Heart disease Brother   . Breast cancer Paternal Aunt     ADVANCED DIRECTIVES (Y/N):  N  HEALTH MAINTENANCE: Social History   Tobacco Use  . Smoking status: Former Smoker    Packs/day: 1.00    Types: Cigarettes    Last attempt to quit: 04/30/1992    Years since quitting: 25.5  . Smokeless tobacco: Never Used  Substance Use Topics  . Alcohol use: No  . Drug use: No     Colonoscopy:  PAP:  Bone density:  Lipid panel:  No Known Allergies  Current Outpatient Medications  Medication Sig Dispense Refill  . Artificial Tear Ointment (DRY EYES OP) Place 1 drop into both eyes at bedtime.    Marland Kitchen aspirin EC 81 MG tablet Take 81 mg by mouth.    Marland Kitchen atorvastatin (LIPITOR) 40 MG tablet Take 1 tablet (40 mg total) by mouth daily at 6 PM. 30 tablet 3  . Cholecalciferol (VITAMIN D3) 2000 units capsule Take 2,000 Units by mouth daily.     . clonazePAM (KLONOPIN) 0.5 MG tablet Take 0.5 tablets (0.25 mg total) by mouth at bedtime. 15 tablet 3  . clotrimazole-betamethasone (LOTRISONE) cream Apply topically.    . cyclobenzaprine (FLEXERIL) 10 MG tablet Take 10 mg by mouth daily as needed for muscle spasms.     Marland Kitchen esomeprazole (NEXIUM) 40 MG capsule Take 40 mg by mouth daily.     Marland Kitchen FLUoxetine (PROZAC) 40 MG capsule Take 1 capsule (40 mg total) by mouth daily. 90 capsule 1  . folic acid (FOLVITE) 1 MG tablet Take 1 mg by mouth 2 (two) times a week.    . gabapentin (NEURONTIN) 300 MG capsule Take 300 mg by mouth.    . lamoTRIgine (LAMICTAL) 25 MG tablet Take 25 mg by mouth daily.    Marland Kitchen levothyroxine (SYNTHROID, LEVOTHROID) 25 MCG tablet Take 25 mcg by mouth daily.     Marland Kitchen loratadine (CLARITIN) 10 MG tablet Take 10 mg by mouth daily.     . meloxicam (MOBIC) 15 MG tablet Take 7.5 mg by mouth 2 (two) times daily.     . montelukast (SINGULAIR)  10 MG tablet Take 10 mg by mouth at bedtime.    Marland Kitchen oxybutynin (DITROPAN) 5 MG tablet Take 5 mg by mouth 3 (three) times daily as needed for bladder spasms.    . pramipexole (MIRAPEX) 0.25 MG tablet Take 0.25-0.5 mg by mouth 3 (three) times daily. Pt takes two tablets in the morning, one tablet at noon, and two tablets at bedtime.    . silver sulfADIAZINE (SILVADENE) 1 % cream Apply topically.    . vitamin B-12 (CYANOCOBALAMIN) 1000 MCG tablet Take 1,000 mcg by mouth daily.    Marland Kitchen zolpidem (AMBIEN) 5 MG tablet TAKE 1 TABLET BY MOUTH AT BEDTIME     No current facility-administered medications for this  visit.     OBJECTIVE: Vitals:   11/06/17 1401  BP: (!) 159/81  Pulse: 81  Resp: 18  Temp: (!) 95.4 F (35.2 C)     Body mass index is 34.95 kg/m.    ECOG FS:0 - Asymptomatic  General: Well-developed, well-nourished, no acute distress. Eyes: Pink conjunctiva, anicteric sclera. HEENT: Normocephalic, moist mucous membranes. Breast: Right breast with subcentimeter nodule in upper outer quadrant.  Appears cystic in nature. Lungs: Clear to auscultation bilaterally. Heart: Regular rate and rhythm. No rubs, murmurs, or gallops. Abdomen: Soft, nontender, nondistended. No organomegaly noted, normoactive bowel sounds. Musculoskeletal: No edema, cyanosis, or clubbing. Neuro: Alert, answering all questions appropriately. Cranial nerves grossly intact. Skin: No rashes or petechiae noted. Psych: Normal affect.  LAB RESULTS:  Lab Results  Component Value Date   NA 138 05/26/2017   K 4.1 05/26/2017   CL 105 05/26/2017   CO2 27 05/26/2017   GLUCOSE 146 (H) 05/26/2017   BUN 39 (H) 05/26/2017   CREATININE 1.05 (H) 05/26/2017   CALCIUM 8.6 (L) 05/26/2017   PROT 7.9 11/08/2015   ALBUMIN 4.1 11/08/2015   AST 22 11/08/2015   ALT 14 11/08/2015   ALKPHOS 79 11/08/2015   BILITOT 0.5 11/08/2015   GFRNONAA 53 (L) 05/26/2017   GFRAA >60 05/26/2017    Lab Results  Component Value Date   WBC 8.0  05/25/2017   NEUTROABS 5.1 11/08/2015   HGB 11.8 (L) 05/25/2017   HCT 35.2 05/25/2017   MCV 87.4 05/25/2017   PLT 242 05/25/2017     STUDIES: No results found.  ASSESSMENT: Stage Ia ER/PR positive adenocarcinoma of the right breast, unspecified location.  PLAN:    1. Stage Ia ER/PR positive adenocarcinoma of the right breast, unspecified location:  Patient completed XRT in January of 2013. She completed 5 years of anastrozole in February 2018. Breast biopsy on August 26, 2016 for suspicious calcifications was negative for malignancy.  Patient has not had a repeat mammogram since her biopsy.  Will schedule him for the next 1 to 2 weeks.  Return to clinic in 1 week for further evaluation.  2. Osteopenia: Patient's most recent bone mineral density on December 14, 2015 revealed a T score of -1.4 which is unchanged from one year prior. Continue monitoring per primary care.   I spent a total of 20 minutes face-to-face with the patient of which greater than 50% of the visit was spent in counseling and coordination of care as detailed above.   Patient expressed understanding and was in agreement with this plan. She also understands that She can call clinic at any time with any questions, concerns, or complaints.   Cancer Staging Primary cancer of right female breast [facility]) Staging form: Breast, AJCC 7th Edition - Clinical stage from 10/17/2015: Stage IA (T1c, N0, M0) - Signed by Lloyd Huger, MD on 10/17/2015   Lloyd Huger, MD   11/08/2017 8:56 AM

## 2017-11-06 ENCOUNTER — Other Ambulatory Visit: Payer: Self-pay

## 2017-11-06 ENCOUNTER — Ambulatory Visit: Payer: PPO | Admitting: Physical Therapy

## 2017-11-06 ENCOUNTER — Inpatient Hospital Stay: Payer: PPO | Attending: Oncology | Admitting: Oncology

## 2017-11-06 VITALS — BP 159/81 | HR 81 | Temp 95.4°F | Resp 18 | Wt 210.0 lb

## 2017-11-06 DIAGNOSIS — Z7982 Long term (current) use of aspirin: Secondary | ICD-10-CM | POA: Insufficient documentation

## 2017-11-06 DIAGNOSIS — Z79899 Other long term (current) drug therapy: Secondary | ICD-10-CM | POA: Insufficient documentation

## 2017-11-06 DIAGNOSIS — C50911 Malignant neoplasm of unspecified site of right female breast: Secondary | ICD-10-CM | POA: Insufficient documentation

## 2017-11-06 DIAGNOSIS — Z87891 Personal history of nicotine dependence: Secondary | ICD-10-CM | POA: Diagnosis not present

## 2017-11-06 DIAGNOSIS — D510 Vitamin B12 deficiency anemia due to intrinsic factor deficiency: Secondary | ICD-10-CM | POA: Diagnosis not present

## 2017-11-06 DIAGNOSIS — G40909 Epilepsy, unspecified, not intractable, without status epilepticus: Secondary | ICD-10-CM | POA: Diagnosis not present

## 2017-11-06 DIAGNOSIS — E039 Hypothyroidism, unspecified: Secondary | ICD-10-CM | POA: Insufficient documentation

## 2017-11-06 DIAGNOSIS — Z170 Estrogen receptor positive status [ER+]: Secondary | ICD-10-CM | POA: Insufficient documentation

## 2017-11-06 DIAGNOSIS — G609 Hereditary and idiopathic neuropathy, unspecified: Secondary | ICD-10-CM | POA: Diagnosis not present

## 2017-11-06 DIAGNOSIS — I10 Essential (primary) hypertension: Secondary | ICD-10-CM | POA: Insufficient documentation

## 2017-11-06 DIAGNOSIS — Z923 Personal history of irradiation: Secondary | ICD-10-CM | POA: Insufficient documentation

## 2017-11-06 DIAGNOSIS — R55 Syncope and collapse: Secondary | ICD-10-CM | POA: Diagnosis not present

## 2017-11-06 DIAGNOSIS — E538 Deficiency of other specified B group vitamins: Secondary | ICD-10-CM | POA: Diagnosis not present

## 2017-11-06 NOTE — Progress Notes (Signed)
Here today for follow up. Stated she continues to feel " knots " in R breast. And stated R breast has shrunk half of original size. No other c/o

## 2017-11-08 ENCOUNTER — Ambulatory Visit: Payer: PPO | Admitting: Physical Therapy

## 2017-11-08 DIAGNOSIS — Z96653 Presence of artificial knee joint, bilateral: Secondary | ICD-10-CM | POA: Diagnosis not present

## 2017-11-08 DIAGNOSIS — M1711 Unilateral primary osteoarthritis, right knee: Secondary | ICD-10-CM | POA: Diagnosis not present

## 2017-11-08 DIAGNOSIS — M25562 Pain in left knee: Secondary | ICD-10-CM | POA: Diagnosis not present

## 2017-11-12 ENCOUNTER — Ambulatory Visit: Payer: PPO | Admitting: Physical Therapy

## 2017-11-14 ENCOUNTER — Ambulatory Visit: Payer: PPO | Admitting: Physical Therapy

## 2017-11-27 ENCOUNTER — Inpatient Hospital Stay: Admission: RE | Admit: 2017-11-27 | Payer: PPO | Source: Ambulatory Visit

## 2017-11-27 DIAGNOSIS — Z8679 Personal history of other diseases of the circulatory system: Secondary | ICD-10-CM | POA: Diagnosis not present

## 2017-11-27 DIAGNOSIS — R002 Palpitations: Secondary | ICD-10-CM | POA: Diagnosis not present

## 2017-11-27 DIAGNOSIS — Z23 Encounter for immunization: Secondary | ICD-10-CM | POA: Diagnosis not present

## 2017-12-04 ENCOUNTER — Ambulatory Visit: Payer: Self-pay | Admitting: Psychiatry

## 2017-12-11 DIAGNOSIS — I4892 Unspecified atrial flutter: Secondary | ICD-10-CM | POA: Diagnosis not present

## 2017-12-11 DIAGNOSIS — I4891 Unspecified atrial fibrillation: Secondary | ICD-10-CM | POA: Diagnosis not present

## 2017-12-11 DIAGNOSIS — R5382 Chronic fatigue, unspecified: Secondary | ICD-10-CM | POA: Diagnosis not present

## 2017-12-25 ENCOUNTER — Ambulatory Visit
Admission: RE | Admit: 2017-12-25 | Discharge: 2017-12-25 | Disposition: A | Discharge status: Home / Self Care | Payer: PPO | Source: Ambulatory Visit | Attending: Oncology | Admitting: Oncology

## 2017-12-25 DIAGNOSIS — Z853 Personal history of malignant neoplasm of breast: Secondary | ICD-10-CM | POA: Insufficient documentation

## 2017-12-25 DIAGNOSIS — Z1231 Encounter for screening mammogram for malignant neoplasm of breast: Secondary | ICD-10-CM | POA: Insufficient documentation

## 2017-12-25 DIAGNOSIS — C50911 Malignant neoplasm of unspecified site of right female breast: Secondary | ICD-10-CM

## 2017-12-25 DIAGNOSIS — I4891 Unspecified atrial fibrillation: Secondary | ICD-10-CM | POA: Diagnosis not present

## 2017-12-25 DIAGNOSIS — I4892 Unspecified atrial flutter: Secondary | ICD-10-CM | POA: Diagnosis not present

## 2017-12-25 DIAGNOSIS — R5382 Chronic fatigue, unspecified: Secondary | ICD-10-CM | POA: Diagnosis not present

## 2018-02-05 DIAGNOSIS — G629 Polyneuropathy, unspecified: Secondary | ICD-10-CM | POA: Diagnosis not present

## 2018-02-05 DIAGNOSIS — M25572 Pain in left ankle and joints of left foot: Secondary | ICD-10-CM | POA: Diagnosis not present

## 2018-02-05 DIAGNOSIS — M19072 Primary osteoarthritis, left ankle and foot: Secondary | ICD-10-CM | POA: Diagnosis not present

## 2018-02-22 DIAGNOSIS — Z79899 Other long term (current) drug therapy: Secondary | ICD-10-CM | POA: Diagnosis not present

## 2018-02-22 DIAGNOSIS — R739 Hyperglycemia, unspecified: Secondary | ICD-10-CM | POA: Diagnosis not present

## 2018-02-22 DIAGNOSIS — E782 Mixed hyperlipidemia: Secondary | ICD-10-CM | POA: Diagnosis not present

## 2018-02-22 DIAGNOSIS — E538 Deficiency of other specified B group vitamins: Secondary | ICD-10-CM | POA: Diagnosis not present

## 2018-03-01 DIAGNOSIS — R739 Hyperglycemia, unspecified: Secondary | ICD-10-CM | POA: Diagnosis not present

## 2018-03-01 DIAGNOSIS — Z0000 Encounter for general adult medical examination without abnormal findings: Secondary | ICD-10-CM | POA: Diagnosis not present

## 2018-03-01 DIAGNOSIS — I480 Paroxysmal atrial fibrillation: Secondary | ICD-10-CM | POA: Diagnosis not present

## 2018-03-01 DIAGNOSIS — E538 Deficiency of other specified B group vitamins: Secondary | ICD-10-CM | POA: Diagnosis not present

## 2018-03-01 DIAGNOSIS — J431 Panlobular emphysema: Secondary | ICD-10-CM | POA: Diagnosis not present

## 2018-03-01 DIAGNOSIS — G40909 Epilepsy, unspecified, not intractable, without status epilepticus: Secondary | ICD-10-CM | POA: Diagnosis not present

## 2018-03-01 DIAGNOSIS — E782 Mixed hyperlipidemia: Secondary | ICD-10-CM | POA: Diagnosis not present

## 2018-03-15 DIAGNOSIS — M109 Gout, unspecified: Secondary | ICD-10-CM | POA: Diagnosis not present

## 2018-05-08 DIAGNOSIS — R55 Syncope and collapse: Secondary | ICD-10-CM | POA: Diagnosis not present

## 2018-06-14 ENCOUNTER — Other Ambulatory Visit: Payer: Self-pay | Admitting: Psychiatry

## 2018-07-16 DIAGNOSIS — M5116 Intervertebral disc disorders with radiculopathy, lumbar region: Secondary | ICD-10-CM | POA: Diagnosis not present

## 2018-07-16 DIAGNOSIS — G40909 Epilepsy, unspecified, not intractable, without status epilepticus: Secondary | ICD-10-CM | POA: Diagnosis not present

## 2018-07-16 DIAGNOSIS — R55 Syncope and collapse: Secondary | ICD-10-CM | POA: Diagnosis not present

## 2018-08-28 DIAGNOSIS — E538 Deficiency of other specified B group vitamins: Secondary | ICD-10-CM | POA: Diagnosis not present

## 2018-08-28 DIAGNOSIS — R739 Hyperglycemia, unspecified: Secondary | ICD-10-CM | POA: Diagnosis not present

## 2018-08-28 DIAGNOSIS — E782 Mixed hyperlipidemia: Secondary | ICD-10-CM | POA: Diagnosis not present

## 2018-09-04 DIAGNOSIS — E782 Mixed hyperlipidemia: Secondary | ICD-10-CM | POA: Diagnosis not present

## 2018-09-04 DIAGNOSIS — I480 Paroxysmal atrial fibrillation: Secondary | ICD-10-CM | POA: Diagnosis not present

## 2018-09-04 DIAGNOSIS — E538 Deficiency of other specified B group vitamins: Secondary | ICD-10-CM | POA: Diagnosis not present

## 2018-09-04 DIAGNOSIS — R739 Hyperglycemia, unspecified: Secondary | ICD-10-CM | POA: Diagnosis not present

## 2018-09-04 DIAGNOSIS — J430 Unilateral pulmonary emphysema [MacLeod's syndrome]: Secondary | ICD-10-CM | POA: Diagnosis not present

## 2018-09-05 DIAGNOSIS — H2512 Age-related nuclear cataract, left eye: Secondary | ICD-10-CM | POA: Diagnosis not present

## 2018-09-05 DIAGNOSIS — Z961 Presence of intraocular lens: Secondary | ICD-10-CM | POA: Diagnosis not present

## 2018-09-05 DIAGNOSIS — H04123 Dry eye syndrome of bilateral lacrimal glands: Secondary | ICD-10-CM | POA: Diagnosis not present

## 2018-09-05 DIAGNOSIS — H5213 Myopia, bilateral: Secondary | ICD-10-CM | POA: Diagnosis not present

## 2018-09-05 DIAGNOSIS — H52223 Regular astigmatism, bilateral: Secondary | ICD-10-CM | POA: Diagnosis not present

## 2018-11-03 NOTE — Progress Notes (Signed)
  [place]  Telephone:(336) [phone] Fax:(336) [phone]  ID: Tonya Walton OB: 04-Jan-1951  MR#: [id number]  [id number]  Patient Care Team: Rusty Aus, MD as PCP - General (Internal Medicine)   Lloyd Huger, MD   11/10/2018 11:18 PM     This encounter was created in error - please disregard.

## 2018-11-06 ENCOUNTER — Other Ambulatory Visit: Payer: Self-pay

## 2018-11-06 ENCOUNTER — Encounter: Payer: Self-pay | Admitting: Oncology

## 2018-11-06 NOTE — Progress Notes (Signed)
Patient pre screened for office appointment, no questions or concerns today. 

## 2018-11-07 ENCOUNTER — Inpatient Hospital Stay: Payer: PPO | Admitting: Oncology

## 2018-11-13 DIAGNOSIS — Z23 Encounter for immunization: Secondary | ICD-10-CM | POA: Diagnosis not present

## 2018-11-13 DIAGNOSIS — R0789 Other chest pain: Secondary | ICD-10-CM | POA: Diagnosis not present

## 2018-11-13 DIAGNOSIS — J320 Chronic maxillary sinusitis: Secondary | ICD-10-CM | POA: Diagnosis not present

## 2018-12-19 ENCOUNTER — Other Ambulatory Visit: Payer: Self-pay | Admitting: Internal Medicine

## 2018-12-19 DIAGNOSIS — N6489 Other specified disorders of breast: Secondary | ICD-10-CM

## 2018-12-19 DIAGNOSIS — Z853 Personal history of malignant neoplasm of breast: Secondary | ICD-10-CM

## 2018-12-25 ENCOUNTER — Ambulatory Visit
Admission: RE | Admit: 2018-12-25 | Discharge: 2018-12-25 | Disposition: A | Discharge status: Home / Self Care | Payer: PPO | Source: Ambulatory Visit | Attending: Internal Medicine | Admitting: Internal Medicine

## 2018-12-25 DIAGNOSIS — N6012 Diffuse cystic mastopathy of left breast: Secondary | ICD-10-CM | POA: Diagnosis not present

## 2018-12-25 DIAGNOSIS — R921 Mammographic calcification found on diagnostic imaging of breast: Secondary | ICD-10-CM | POA: Diagnosis not present

## 2018-12-25 DIAGNOSIS — Z853 Personal history of malignant neoplasm of breast: Secondary | ICD-10-CM

## 2018-12-25 DIAGNOSIS — R928 Other abnormal and inconclusive findings on diagnostic imaging of breast: Secondary | ICD-10-CM | POA: Diagnosis not present

## 2018-12-25 DIAGNOSIS — N6489 Other specified disorders of breast: Secondary | ICD-10-CM | POA: Diagnosis not present

## 2018-12-25 DIAGNOSIS — N6324 Unspecified lump in the left breast, lower inner quadrant: Secondary | ICD-10-CM | POA: Diagnosis not present

## 2018-12-25 DIAGNOSIS — N6313 Unspecified lump in the right breast, lower outer quadrant: Secondary | ICD-10-CM | POA: Diagnosis not present

## 2019-01-01 ENCOUNTER — Other Ambulatory Visit: Payer: Self-pay | Admitting: Internal Medicine

## 2019-01-01 DIAGNOSIS — R928 Other abnormal and inconclusive findings on diagnostic imaging of breast: Secondary | ICD-10-CM

## 2019-01-01 DIAGNOSIS — N6489 Other specified disorders of breast: Secondary | ICD-10-CM

## 2019-01-06 DIAGNOSIS — J430 Unilateral pulmonary emphysema [MacLeod's syndrome]: Secondary | ICD-10-CM | POA: Diagnosis not present

## 2019-01-06 DIAGNOSIS — J40 Bronchitis, not specified as acute or chronic: Secondary | ICD-10-CM | POA: Diagnosis not present

## 2019-01-06 DIAGNOSIS — I480 Paroxysmal atrial fibrillation: Secondary | ICD-10-CM | POA: Diagnosis not present

## 2019-01-07 ENCOUNTER — Ambulatory Visit
Admission: RE | Admit: 2019-01-07 | Discharge: 2019-01-07 | Disposition: A | Discharge status: Home / Self Care | Payer: PPO | Source: Ambulatory Visit | Attending: Internal Medicine | Admitting: Internal Medicine

## 2019-01-07 DIAGNOSIS — N6311 Unspecified lump in the right breast, upper outer quadrant: Secondary | ICD-10-CM | POA: Diagnosis not present

## 2019-01-07 DIAGNOSIS — R928 Other abnormal and inconclusive findings on diagnostic imaging of breast: Secondary | ICD-10-CM | POA: Diagnosis not present

## 2019-01-07 DIAGNOSIS — N6489 Other specified disorders of breast: Secondary | ICD-10-CM

## 2019-01-07 DIAGNOSIS — Z1239 Encounter for other screening for malignant neoplasm of breast: Secondary | ICD-10-CM | POA: Diagnosis not present

## 2019-01-07 HISTORY — PX: BREAST BIOPSY: SHX20

## 2019-01-08 LAB — SURGICAL PATHOLOGY

## 2019-02-27 ENCOUNTER — Other Ambulatory Visit: Payer: Self-pay | Admitting: Psychiatry

## 2019-03-07 DIAGNOSIS — E538 Deficiency of other specified B group vitamins: Secondary | ICD-10-CM | POA: Diagnosis not present

## 2019-03-07 DIAGNOSIS — E782 Mixed hyperlipidemia: Secondary | ICD-10-CM | POA: Diagnosis not present

## 2019-03-07 DIAGNOSIS — R739 Hyperglycemia, unspecified: Secondary | ICD-10-CM | POA: Diagnosis not present

## 2019-03-14 DIAGNOSIS — Z0000 Encounter for general adult medical examination without abnormal findings: Secondary | ICD-10-CM | POA: Diagnosis not present

## 2019-03-14 DIAGNOSIS — F330 Major depressive disorder, recurrent, mild: Secondary | ICD-10-CM | POA: Diagnosis not present

## 2019-03-14 DIAGNOSIS — I480 Paroxysmal atrial fibrillation: Secondary | ICD-10-CM | POA: Diagnosis not present

## 2019-03-14 DIAGNOSIS — G40909 Epilepsy, unspecified, not intractable, without status epilepticus: Secondary | ICD-10-CM | POA: Diagnosis not present

## 2019-03-14 DIAGNOSIS — J431 Panlobular emphysema: Secondary | ICD-10-CM | POA: Diagnosis not present

## 2019-03-14 DIAGNOSIS — E782 Mixed hyperlipidemia: Secondary | ICD-10-CM | POA: Diagnosis not present

## 2019-03-14 DIAGNOSIS — R739 Hyperglycemia, unspecified: Secondary | ICD-10-CM | POA: Diagnosis not present

## 2019-04-02 DIAGNOSIS — L299 Pruritus, unspecified: Secondary | ICD-10-CM | POA: Diagnosis not present

## 2019-04-02 DIAGNOSIS — R21 Rash and other nonspecific skin eruption: Secondary | ICD-10-CM | POA: Diagnosis not present

## 2019-06-03 DIAGNOSIS — Z96653 Presence of artificial knee joint, bilateral: Secondary | ICD-10-CM | POA: Diagnosis not present

## 2019-07-28 ENCOUNTER — Other Ambulatory Visit: Payer: Self-pay | Admitting: Internal Medicine

## 2019-07-28 DIAGNOSIS — I480 Paroxysmal atrial fibrillation: Secondary | ICD-10-CM | POA: Diagnosis not present

## 2019-07-28 DIAGNOSIS — G609 Hereditary and idiopathic neuropathy, unspecified: Secondary | ICD-10-CM | POA: Diagnosis not present

## 2019-07-28 DIAGNOSIS — R42 Dizziness and giddiness: Secondary | ICD-10-CM

## 2019-07-28 DIAGNOSIS — G2581 Restless legs syndrome: Secondary | ICD-10-CM | POA: Diagnosis not present

## 2019-07-28 DIAGNOSIS — I10 Essential (primary) hypertension: Secondary | ICD-10-CM | POA: Diagnosis not present

## 2019-07-28 DIAGNOSIS — I2510 Atherosclerotic heart disease of native coronary artery without angina pectoris: Secondary | ICD-10-CM | POA: Diagnosis not present

## 2019-07-29 ENCOUNTER — Encounter (INDEPENDENT_AMBULATORY_CARE_PROVIDER_SITE_OTHER): Payer: Self-pay

## 2019-07-29 ENCOUNTER — Ambulatory Visit
Admission: RE | Admit: 2019-07-29 | Discharge: 2019-07-29 | Disposition: A | Discharge status: Home / Self Care | Payer: PPO | Source: Ambulatory Visit | Attending: Internal Medicine | Admitting: Internal Medicine

## 2019-07-29 ENCOUNTER — Other Ambulatory Visit: Payer: Self-pay

## 2019-07-29 DIAGNOSIS — R42 Dizziness and giddiness: Secondary | ICD-10-CM

## 2019-07-29 DIAGNOSIS — I6389 Other cerebral infarction: Secondary | ICD-10-CM | POA: Diagnosis not present

## 2019-07-29 DIAGNOSIS — G9389 Other specified disorders of brain: Secondary | ICD-10-CM | POA: Diagnosis not present

## 2019-07-29 DIAGNOSIS — R9082 White matter disease, unspecified: Secondary | ICD-10-CM | POA: Diagnosis not present

## 2019-07-31 DIAGNOSIS — I10 Essential (primary) hypertension: Secondary | ICD-10-CM | POA: Diagnosis not present

## 2019-07-31 DIAGNOSIS — I480 Paroxysmal atrial fibrillation: Secondary | ICD-10-CM | POA: Diagnosis not present

## 2019-07-31 DIAGNOSIS — R42 Dizziness and giddiness: Secondary | ICD-10-CM | POA: Diagnosis not present

## 2019-07-31 DIAGNOSIS — G609 Hereditary and idiopathic neuropathy, unspecified: Secondary | ICD-10-CM | POA: Diagnosis not present

## 2019-09-02 DIAGNOSIS — Z8673 Personal history of transient ischemic attack (TIA), and cerebral infarction without residual deficits: Secondary | ICD-10-CM | POA: Diagnosis not present

## 2019-09-02 DIAGNOSIS — R42 Dizziness and giddiness: Secondary | ICD-10-CM | POA: Diagnosis not present

## 2019-09-02 DIAGNOSIS — G2581 Restless legs syndrome: Secondary | ICD-10-CM | POA: Diagnosis not present

## 2019-09-02 DIAGNOSIS — G609 Hereditary and idiopathic neuropathy, unspecified: Secondary | ICD-10-CM | POA: Diagnosis not present

## 2019-09-02 DIAGNOSIS — R55 Syncope and collapse: Secondary | ICD-10-CM | POA: Diagnosis not present

## 2019-09-02 DIAGNOSIS — I480 Paroxysmal atrial fibrillation: Secondary | ICD-10-CM | POA: Diagnosis not present

## 2019-09-02 DIAGNOSIS — R259 Unspecified abnormal involuntary movements: Secondary | ICD-10-CM | POA: Diagnosis not present

## 2019-09-04 DIAGNOSIS — E782 Mixed hyperlipidemia: Secondary | ICD-10-CM | POA: Diagnosis not present

## 2019-09-04 DIAGNOSIS — R739 Hyperglycemia, unspecified: Secondary | ICD-10-CM | POA: Diagnosis not present

## 2019-09-04 DIAGNOSIS — Z79899 Other long term (current) drug therapy: Secondary | ICD-10-CM | POA: Diagnosis not present

## 2019-09-10 DIAGNOSIS — R739 Hyperglycemia, unspecified: Secondary | ICD-10-CM | POA: Diagnosis not present

## 2019-09-10 DIAGNOSIS — E538 Deficiency of other specified B group vitamins: Secondary | ICD-10-CM | POA: Diagnosis not present

## 2019-09-10 DIAGNOSIS — E782 Mixed hyperlipidemia: Secondary | ICD-10-CM | POA: Diagnosis not present

## 2019-09-10 DIAGNOSIS — G40909 Epilepsy, unspecified, not intractable, without status epilepticus: Secondary | ICD-10-CM | POA: Diagnosis not present

## 2019-09-10 DIAGNOSIS — I480 Paroxysmal atrial fibrillation: Secondary | ICD-10-CM | POA: Diagnosis not present

## 2019-09-10 DIAGNOSIS — J431 Panlobular emphysema: Secondary | ICD-10-CM | POA: Diagnosis not present

## 2019-09-16 DIAGNOSIS — R42 Dizziness and giddiness: Secondary | ICD-10-CM | POA: Diagnosis not present

## 2019-09-16 DIAGNOSIS — H903 Sensorineural hearing loss, bilateral: Secondary | ICD-10-CM | POA: Diagnosis not present

## 2019-09-30 DIAGNOSIS — H5213 Myopia, bilateral: Secondary | ICD-10-CM | POA: Diagnosis not present

## 2019-09-30 DIAGNOSIS — H52223 Regular astigmatism, bilateral: Secondary | ICD-10-CM | POA: Diagnosis not present

## 2019-09-30 DIAGNOSIS — H43813 Vitreous degeneration, bilateral: Secondary | ICD-10-CM | POA: Diagnosis not present

## 2019-09-30 DIAGNOSIS — H2512 Age-related nuclear cataract, left eye: Secondary | ICD-10-CM | POA: Diagnosis not present

## 2019-09-30 DIAGNOSIS — Z961 Presence of intraocular lens: Secondary | ICD-10-CM | POA: Diagnosis not present

## 2019-09-30 DIAGNOSIS — H04123 Dry eye syndrome of bilateral lacrimal glands: Secondary | ICD-10-CM | POA: Diagnosis not present

## 2019-10-20 DIAGNOSIS — R0600 Dyspnea, unspecified: Secondary | ICD-10-CM | POA: Diagnosis not present

## 2019-10-20 DIAGNOSIS — E860 Dehydration: Secondary | ICD-10-CM | POA: Diagnosis not present

## 2019-10-20 DIAGNOSIS — R0602 Shortness of breath: Secondary | ICD-10-CM | POA: Diagnosis not present

## 2019-10-20 DIAGNOSIS — B349 Viral infection, unspecified: Secondary | ICD-10-CM | POA: Diagnosis not present

## 2019-10-24 ENCOUNTER — Ambulatory Visit: Payer: PPO

## 2019-10-29 ENCOUNTER — Ambulatory Visit: Payer: PPO

## 2019-10-30 DIAGNOSIS — H43813 Vitreous degeneration, bilateral: Secondary | ICD-10-CM | POA: Diagnosis not present

## 2019-10-30 DIAGNOSIS — Z961 Presence of intraocular lens: Secondary | ICD-10-CM | POA: Diagnosis not present

## 2019-10-30 DIAGNOSIS — H04123 Dry eye syndrome of bilateral lacrimal glands: Secondary | ICD-10-CM | POA: Diagnosis not present

## 2019-10-30 DIAGNOSIS — H52223 Regular astigmatism, bilateral: Secondary | ICD-10-CM | POA: Diagnosis not present

## 2019-10-30 DIAGNOSIS — H5213 Myopia, bilateral: Secondary | ICD-10-CM | POA: Diagnosis not present

## 2019-10-30 DIAGNOSIS — H2512 Age-related nuclear cataract, left eye: Secondary | ICD-10-CM | POA: Diagnosis not present

## 2019-10-31 ENCOUNTER — Ambulatory Visit: Payer: PPO

## 2019-11-03 ENCOUNTER — Ambulatory Visit: Payer: PPO

## 2019-11-12 ENCOUNTER — Ambulatory Visit: Payer: PPO

## 2019-11-14 ENCOUNTER — Ambulatory Visit: Payer: PPO

## 2019-11-19 ENCOUNTER — Ambulatory Visit: Payer: PPO

## 2019-11-21 ENCOUNTER — Ambulatory Visit: Payer: PPO

## 2019-11-21 ENCOUNTER — Ambulatory Visit: Payer: PPO | Attending: Neurology

## 2019-11-21 ENCOUNTER — Other Ambulatory Visit: Payer: Self-pay

## 2019-11-21 DIAGNOSIS — R42 Dizziness and giddiness: Secondary | ICD-10-CM | POA: Diagnosis not present

## 2019-11-21 DIAGNOSIS — R2681 Unsteadiness on feet: Secondary | ICD-10-CM

## 2019-11-21 NOTE — Therapy (Signed)
[place] MAIN [facility] SERVICES [address] [place], [place], [address] Phone: [phone]   Fax:  [phone]  Physical Therapy Evaluation  Patient Details  Name: Tonya Walton MRN: [id number] Date of Birth: 12-17-1950 Referring Provider (PT): Dr. Manuella Ghazi   Encounter Date: 11/21/2019   PT End of Session - 11/21/19 1237    Visit Number 1    Number of Visits 25    Date for PT Re-Evaluation 02/13/20    Authorization Type eval: 11/21/19    PT Start Time 0908    PT Stop Time 1000    PT Time Calculation (min) 52 min    Activity Tolerance Patient tolerated treatment well    Behavior During Therapy [facility] for tasks assessed/performed           Past Medical History:  Diagnosis Date  . Anxiety   . Arthritis   . Atrial fibrillation, controlled ([place])   . Breast cancer ([place]) 2012   RT LUMPECTOMY  . CHF (congestive heart failure) ([place])   . COPD (chronic obstructive pulmonary disease) ([place])   . Coronary artery disease   . Depression   . Dysrhythmia    Afib  . GERD (gastroesophageal reflux disease)   . Headache   . Hypertension   . Hypothyroidism   . Neuromuscular disorder ([place])    nerve damage in legs  . Personal history of radiation therapy 2012   BREAST CA  . Restless leg syndrome   . Seizures ([place])   . Shortness of breath dyspnea     Past Surgical History:  Procedure Laterality Date  . APPENDECTOMY    . BREAST BIOPSY Left 07/2013   NEG  . BREAST BIOPSY Right 08/22/2016   benign  . BREAST BIOPSY Right 01/07/2019   asymmetry bx with stereo, coil marker, path pending  . BREAST BIOPSY Right 01/07/2019   asymm affirm bx,  x marker,  path pending  . BREAST EXCISIONAL BIOPSY Right 2012   POS  . BREAST LUMPECTOMY     Right breast  . CARDIAC CATHETERIZATION N/A 03/02/2015   Procedure: Left Heart Cath and Coronary Angiography;  Surgeon: Teodoro Spray, MD;  Location: [place] CV LAB;  Service: Cardiovascular;  Laterality: N/A;    . CATARACT EXTRACTION W/PHACO Right 10/14/2014   Procedure: CATARACT EXTRACTION PHACO AND INTRAOCULAR LENS PLACEMENT (IOC);  Surgeon: Leandrew Koyanagi, MD;  Location: [place];  Service: Ophthalmology;  Laterality: Right;  . CORONARY ARTERY BYPASS GRAFT N/A 04/01/2015   Procedure: CORONARY ARTERY BYPASS GRAFTING (CABG) x one , using left mammary artery;  Surgeon: Ivin Poot, MD;  Location: [place];  Service: Open Heart Surgery;  Laterality: N/A;  . JOINT REPLACEMENT Right    Total Knee Replacement  . TEE WITHOUT CARDIOVERSION N/A 04/01/2015   Procedure: TRANSESOPHAGEAL ECHOCARDIOGRAM (TEE);  Surgeon: Ivin Poot, MD;  Location: [place];  Service: Open Heart Surgery;  Laterality: N/A;  . TONSILLECTOMY    . TOTAL KNEE ARTHROPLASTY Right 06/10/2014   Procedure: TOTAL KNEE ARTHROPLASTY;  Surgeon: Dereck Leep, MD;  Location: [facility] ORS;  Service: Orthopedics;  Laterality: Right;  . TOTAL KNEE ARTHROPLASTY Left 09/02/2014   Procedure: TOTAL KNEE ARTHROPLASTY;  Surgeon: Dereck Leep, MD;  Location: [facility] ORS;  Service: Orthopedics;  Laterality: Left;    There were no vitals filed for this visit.    Subjective Assessment - 11/21/19 1232    Subjective Dizziness/unsteadiness    Pertinent History Pt reports that she  was referred to Dr. Manuella Ghazi in 2019 because she was having tremors, vertigo, and 8 sycopal episodes over the period of 2 weeks. Per pt she states that the neurologist believed that her syncopal episodes were related to seizures. She reports that she had a 3 day EEG and reports that it did not show any seizures. She is now on anti seizure meds and has not had any more syncopal episodes recently. Due to the dizziness she states that she was referred to [facility] and reports that they did a VNG study which was normal. She reports that if she is laying in bed sometimes she will get vertigo which will last for anywhere from 30s to 1 minute. Symptoms had improved but started back a  couple days ago. She reports that she would like to work on her balance with therapy. Per neurologist note he is considering that her symptoms could be a component of vestibular migraine as she has a history of migraine headaches in her 70's. She had a brain MRI performed 07/29/2019, which showed mild to moderate white matter microvascular ischemic and metabolic changes which are likely age appropriate, otherwise unremarkable MRI of the brain. Dr. Trena Platt note reports old lacunar stroke in right cerebellum and mild microvascular ischemic changes in frontal subcortical regions. She is currently taking Gabapentin for peripheral neuropathy which is causing imbalance and sensory ataxia. She states that the neuropathy is caused by a genetic B12 deficiency. Hx of a-fib which is mostly managed by Dr. Sabra Heck but she has seen Dr. Ubaldo Glassing with cardiology. Neurology notes indicated pt has a benign essential tremor in BUE.  PMH also includes arthritis, CHF, COPD, GERD, hypertension, and R sided breast cancer with radiation treatment.    Limitations Walking    Diagnostic tests Brain MRI: see history    Patient Stated Goals Improve balance and dizziness    Currently in Pain? Other (Comment)   Unrelated to current episode          VESTIBULAR AND BALANCE EVALUATION   HISTORY:  Subjective history of current problem: Pt reports that she was referred to Dr. Manuella Ghazi in 2019 because she was having tremors, vertigo, and 8 sycopal episodes over the period of 2 weeks. Per pt she states that the neurologist believed that her syncopal episodes were related to seizures. She reports that she had a 3 day EEG and reports that it did not show any seizures. She is now on anti seizure meds and has not had any more syncopal episodes recently. Due to the dizziness she states that she was referred to [facility] ENT and reports that they did a VNG study which was normal. She reports that if she is laying in bed sometimes she will get vertigo which  will last for anywhere from 30s to 1 minute. Symptoms had improved but started back a couple days ago. She reports that she would like to work on her balance with therapy. Per neurologist note he is considering that her symptoms could be a component of vestibular migraine as she has a history of migraine headaches in her 70's. She had a brain MRI performed 07/29/2019, which showed mild to moderate white matter microvascular ischemic and metabolic changes which are likely age appropriate, otherwise unremarkable MRI of the brain. Dr. Trena Platt note reports old lacunar stroke in right cerebellum and mild microvascular ischemic changes in frontal subcortical regions. She is currently taking Gabapentin for peripheral neuropathy which is causing imbalance and sensory ataxia. She states that the neuropathy is  caused by a genetic B12 deficiency. Hx of a-fib which is mostly managed by Dr. Sabra Heck but she has seen Dr. Ubaldo Glassing with cardiology. Neurology notes indicated pt has a benign essential tremor in BUE.  PMH also includes arthritis, CHF, COPD, GERD, hypertension, and R sided breast cancer with radiation treatment.   Description of dizziness: unsteadiness (vertigo, unsteadiness, lightheadedness, falling, general unsteadiness, whoozy, swimmy-headed sensation, aural fullness) Frequency: Once yesterday and twice the previous day Duration: 30s to 1 minute Symptom nature: (motion provoked, positional, spontaneous, constant, variable, intermittent) spontaneous but occurs when laying flat in bed  Provocative Factors: Laying down for a prolonged period of time Easing Factors: waiting for symptoms to pass  Progression of symptoms: (better, worse, no change since onset) Better initially but symptoms have increased again over the last couple days History of similar episodes: June 2021, resolved with meclizine  Falls (yes/no): No Number of falls in past 6 months: No  Prior Functional Level: Independent with ADLs/IADLs,  drives  Auditory complaints (tinnitus, pain, drainage, hearing loss, aural fullness): ear itching, pt reports ENT advised her she has very mild hearing loss bilaterally Vision (diplopia, visual field loss, recent changes, last eye exam): "floaters" Headaches: Not currently but history of migraines in her 20's;  Red Flags: (dysarthria, dysphagia, drop attacks, bowel and bladder changes, recent weight loss/gain) Dysarthria for years but not worsening, personal history of cancern. Otherwise review of systems negative for red flags.     EXAMINATION   NEUROLOGICAL SCREEN: (2+ unless otherwise noted.) N=normal  Ab=abnormal  Level Dermatome R L Myotome R L Reflex R L  C3 Anterior Neck N N Sidebend C2-3 N N Jaw CN V    C4 Top of Shoulder N N Shoulder Shrug C4 N N Hoffman's UMN    C5 Lateral Upper Arm N N Shoulder ABD C4-5 N N Biceps C5-6    C6 Lateral Arm/ Thumb N N Arm Flex/ Wrist Ext C5-6 N N Brachiorad. C5-6    C7 Middle Finger N N Arm Ext//Wrist Flex C6-7 N N Triceps C7    C8 4th & 5th Finger N N Flex/ Ext Carpi Ulnaris C8 N N Patellar (L3-4)    T1 Medial Arm N N Interossei T1 N N Gastrocnemius    L2 Medial thigh/groin N N Illiopsoas (L2-3) N N     L3 Lower thigh/med.knee Ab Ab Quadriceps (L3-4) N N     L4 Medial leg/lat thigh Ab Ab Tibialis Ant (L4-5) N N     L5 Lat. leg & dorsal foot Ab Ab EHL (L5) N N     S1 post/lat foot/thigh/leg Ab Ab Gastrocnemius (S1-2) N N     S2 Post./med. thigh & leg Ab Ab Hamstrings (L4-S3) N N       Cranial Nerves Visual acuity and visual fields are intact  Extraocular muscles are intact  Facial sensation is is diminished on the R side per pt report with testing Facial strength is intact bilaterally  Hearing is normal as tested by gross conversation Normal phonation, unable to observe palate with mouth open Shoulder shrug strength is intact  Tongue protrudes midline    SOMATOSENSORY:         Sensation           Intact      Diminished          Absent  Light touch  Stocking-type neuropathy starting just above the knees down to the feet      COORDINATION: Finger to Nose: Action  tremor noted BUE Heel to Shin: Normal Pronator Drift: Negative, resting tremor visualized BUE Rapid Alternating Movements: Normal Finger to Thumb Opposition: Normal  MUSCULOSKELETAL SCREEN: Cervical Spine ROM: WFL and painless in all planes. No gross deficits identified   ROM: WNL  MMT: WNL  Functional Mobility: Independent for transfers and ambulation without assistive device   Gait: Scanning of visual environment with gait is: WNL   Clinical Test of Sensory Interaction for Balance    (CTSIB): Deferred   OCULOMOTOR / VESTIBULAR TESTING:  Oculomotor Exam- Room Light  Findings Comments  Ocular Alignment normal   Ocular ROM normal   Spontaneous Nystagmus normal   Gaze-Holding Nystagmus normal   End-Gaze Nystagmus normal   Vergence (normal 2-3") not examined   Smooth Pursuit abnormal   Cross-Cover Test not examined   Saccades abnormal   VOR Cancellation normal   Left Head Impulse normal   Right Head Impulse normal   Static Acuity not examined   Dynamic Acuity not examined     Oculomotor Exam- Fixation Suppressed: Deferred   BPPV TESTS:  Symptoms Duration Intensity Nystagmus  L Dix-Hallpike "A little dizzy"   Pure horizontal apogeotrophic nystagmus lasting approx. 1 minute  R Dix-Hallpike None   Pure horizontal apogeotrophic nystagmus lasting approx. 1 minute  L Head Roll "Just a little blurry" >1 min, 5-10s latency "Just a little blurry" Pure horizontal apogeotrophic nystagmus lasting greater than 1 minute  R Head Roll Vertigo >1 min, 10-15s latency 8/10 Pure horizontal apogeotrophic nystagmus lasting greater than 1 minute  L Sidelying Test      R Sidelying Test        FUNCTIONAL OUTCOME MEASURES   Results Comments  BERG Deferred Deferred  DGI Deferred Deferred  TUG Deferred Deferred  5TSTS Deferred Deferred  10 Meter  Gait Speed Deferred Deferred  ABC Scale 20.625% Low Balance confidence  DHI 84/100 Severe perception of dizziness  FOTO 48 Predicted improvement to 53                     [facility] PT Assessment - 11/21/19 1244      Assessment   Medical Diagnosis Recurrent syncope    Referring Provider (PT) Dr. Manuella Ghazi    Onset Date/Surgical Date 07/22/19   Approximate   Hand Dominance Right    Next MD Visit Not reported    Prior Therapy Not for this issue      Precautions   Precautions Fall      Restrictions   Weight Bearing Restrictions No      Balance Screen   Has the patient fallen in the past 6 months No    Has the patient had a decrease in activity level because of a fear of falling?  No    Is the patient reluctant to leave their home because of a fear of falling?  No      Home Environment   Living Environment Private residence    Living Arrangements Spouse/significant other    Available Help at Discharge Family    Type of [place] to enter    Entrance Stairs-Number of Steps 2    Entrance Stairs-Rails None   Post near stairs that can be reached   Home Layout One level      Prior Function   Level of Independence Independent      Cognition   Overall Cognitive Status Within Functional Limits for tasks assessed  Objective measurements completed on examination: See above findings.               PT Education - 11/21/19 1237    Education Details Plan of care, BPPV    Person(s) Educated Patient    Methods Explanation    Comprehension Verbalized understanding            PT Short Term Goals - 11/21/19 1246      PT SHORT TERM GOAL #1   Title Pt will be independent with HEP in order to improve strength and balance and decrease dizziness in order to decrease fall risk and improve function at home.    Time 6    Status New    Target Date 01/02/20             PT Long Term Goals - 11/21/19 1246       PT LONG TERM GOAL #1   Title Pt will improve ABC by at least 13% in order to demonstrate clinically significant improvement in balance confidence.    Baseline 11/21/19: 20.625%    Time 12    Period Weeks    Status New    Target Date 02/13/20      PT LONG TERM GOAL #2   Title Pt will decrease DHI score by at least 18 points in order to demonstrate clinically significant reduction in disability    Baseline 11/21/19: 84/100    Time 12    Period Weeks    Status New    Target Date 02/13/20      PT LONG TERM GOAL #3   Title Pt will report no further episodes of vertigo when laying in bed    Time 12    Period Weeks    Status New    Target Date 02/13/20                  Plan - 11/21/19 1237    Clinical Impression Statement Pt is a pleasant 69 year-old female referred for dizziness. PT examination reveals positive roll test for left horizontal cupulolithiasis.  She does have a approximately 15-second latency in the roll test position which is why it may have been missed in the past.  Patient defers canalith repositioning treatment during evaluation due to concerns regarding her ability to safely ambulate after treatment and return home.  She plans to initiate canalith repositioning treatment at first follow-up session when her husband brings her in for therapy.  Once BPPV is clear patient would benefit from balance and strength assessment and possibly additional interventions as necessary. Pt will benefit from skilled PT services to address BPPV as well as deficits in balance to decrease her risk for fall and improve function at home.    Personal Factors and Comorbidities Comorbidity 2    Comorbidities HTN, OA, migraines, COPD, CAD s/p CABG, anxiety    Examination-Activity Limitations Bed Mobility;Locomotion Level    Examination-Participation Restrictions Cleaning;Community Activity;Meal Prep;Shop;Yard Work    Stability/Clinical Decision Making Unstable/Unpredictable    Clinical  Decision Making High    Rehab Potential Good    PT Frequency 2x / week    PT Duration 12 weeks    PT Treatment/Interventions ADLs/Self Care Home Management;Aquatic Therapy;Biofeedback;Canalith Repostioning;Cryotherapy;Electrical Stimulation;Iontophoresis 4mg /ml Dexamethasone;Moist Heat;Traction;Ultrasound;DME Instruction;Gait training;Stair training;Functional mobility training;Therapeutic activities;Therapeutic exercise;Balance training;Neuromuscular re-education;Cognitive remediation;Patient/family education;Manual techniques;Passive range of motion;Dry needling;Vestibular;Spinal Manipulations;Joint Manipulations    PT Next Visit Plan Recheck roll test and treat pt with L side Gufoni manuever for L horizontal cupulolithiasis, once clear  assess and treat balance    PT Home Exercise Plan None currently    Consulted and Agree with Plan of Care Patient           Patient will benefit from skilled therapeutic intervention in order to improve the following deficits and impairments:  Decreased balance, Dizziness  Visit Diagnosis: Unsteadiness on feet  Dizziness and giddiness     Problem List Patient Active Problem List   Diagnosis Date Noted  . Peripheral neuropathy, idiopathic 07/11/2017  . Seizure disorder ([place]) 06/12/2017  . Syncope 05/25/2017  . Medicare annual wellness visit, initial 02/24/2016  . Chest pain 04/01/2015  . CAD in native artery 03/10/2015  . Unstable angina pectoris ([place]) 02/23/2015  . Benign essential HTN 10/20/2014  . Chronic anxiety 10/20/2014  . Anxiety, generalized 10/20/2014  . Insomnia, persistent 10/20/2014  . Recurrent major depressive episodes ([place]) 10/20/2014  . Depression, major, single episode, severe ([place]) 10/20/2014  . Combined fat and carbohydrate induced hyperlipemia 10/20/2014  . Restless leg syndrome 10/20/2014  . MDD (major depressive disorder), recurrent episode, moderate ([place]) 10/20/2014  . AF (paroxysmal atrial fibrillation) ([place])  07/31/2014  . Paroxysmal atrial fibrillation ([place]) 07/31/2014  . H/O total knee replacement 06/25/2014  . Total knee replacement status 06/10/2014  . H/O malignant neoplasm of breast 05/24/2014  . Restless leg 05/24/2014  . H/O: HTN (hypertension) 05/24/2014  . History of atrial fibrillation 05/24/2014  . CAFL (chronic airflow limitation) ([place]) 05/24/2014  . Vitamin B12 deficiency 05/24/2014  . H/O disease 05/24/2014  . H/O: hypothyroidism 05/24/2014  . H/O gastrointestinal disease 05/24/2014  . [place]ison anemia 02/16/2014  . Anxiety 11/06/2013  . A-fib ([place]) 11/06/2013  . Clinical depression 11/06/2013  . Acid reflux 11/06/2013  . Heart valve disease 11/06/2013  . Primary cancer of right female breast ([place]) 11/06/2013  . HLD (hyperlipidemia) 11/06/2013  . BP (high blood pressure) 11/06/2013  . Atrial fibrillation ([place]) 11/06/2013  . Chronic obstructive pulmonary disease ([place]) 11/06/2013  . Hormone receptor positive malignant neoplasm of breast ([place]) 11/06/2013  . Adult hypothyroidism 06/29/2010  . Adult BMI 30+ 06/29/2010  . B-complex deficiency 12/05/1995  . Extremity pain 12/05/1995   Phillips Grout PT, DPT, GCS  Kairav Russomanno 11/21/2019, 12:50 PM  [place] MAIN [facility] SERVICES [address] [place], [place], [address] Phone: [phone]   Fax:  [phone]  Name: CATHLENE GARDELLA MRN: [id number] Date of Birth: [birth year]

## 2019-11-25 ENCOUNTER — Ambulatory Visit: Payer: PPO

## 2019-11-27 ENCOUNTER — Ambulatory Visit: Payer: PPO

## 2019-11-27 ENCOUNTER — Other Ambulatory Visit: Payer: Self-pay

## 2019-11-27 DIAGNOSIS — R42 Dizziness and giddiness: Secondary | ICD-10-CM

## 2019-11-27 DIAGNOSIS — R2681 Unsteadiness on feet: Secondary | ICD-10-CM

## 2019-11-27 NOTE — Therapy (Signed)
Tonya Walton MAIN [facility] SERVICES [address] [place], [place], [address] Phone: [phone]   Fax:  [phone]  Physical Therapy Treatment  Patient Details  Name: Tonya Walton MRN: [id number] Date of Birth: [birth year] Referring Provider (PT): Dr. Manuella Ghazi   Encounter Date: 11/27/2019   PT End of Session - 11/27/19 1108    Visit Number 2    Number of Visits 25    Date for PT Re-Evaluation 02/13/20    Authorization Type eval: 11/21/19    PT Start Time 1105    PT Stop Time 1145    PT Time Calculation (min) 40 min    Activity Tolerance Patient tolerated treatment well    Behavior During Therapy [facility] for tasks assessed/performed           Past Medical History:  Diagnosis Date  . Anxiety   . Arthritis   . Atrial fibrillation, controlled ([place])   . Breast cancer ([place]) 2012   RT LUMPECTOMY  . CHF (congestive heart failure) ([place])   . COPD (chronic obstructive pulmonary disease) ([place])   . Coronary artery disease   . Depression   . Dysrhythmia    Afib  . GERD (gastroesophageal reflux disease)   . Headache   . Hypertension   . Hypothyroidism   . Neuromuscular disorder ([place])    nerve damage in legs  . Personal history of radiation therapy 2012   BREAST CA  . Restless leg syndrome   . Seizures ([place])   . Shortness of breath dyspnea     Past Surgical History:  Procedure Laterality Date  . APPENDECTOMY    . BREAST BIOPSY Left 07/2013   NEG  . BREAST BIOPSY Right 08/22/2016   benign  . BREAST BIOPSY Right 01/07/2019   asymmetry bx with stereo, coil marker, path pending  . BREAST BIOPSY Right 01/07/2019   asymm affirm bx,  x marker,  path pending  . BREAST EXCISIONAL BIOPSY Right 2012   POS  . BREAST LUMPECTOMY     Right breast  . CARDIAC CATHETERIZATION N/A 03/02/2015   Procedure: Left Heart Cath and Coronary Angiography;  Surgeon: Teodoro Spray, MD;  Location: [facility];  Service: Cardiovascular;  Laterality: N/A;  .  CATARACT EXTRACTION W/PHACO Right 10/14/2014   Procedure: CATARACT EXTRACTION PHACO AND INTRAOCULAR LENS PLACEMENT (IOC);  Surgeon: Leandrew Koyanagi, MD;  Location: [facility];  Service: Ophthalmology;  Laterality: Right;  . CORONARY ARTERY BYPASS GRAFT N/A 04/01/2015   Procedure: CORONARY ARTERY BYPASS GRAFTING (CABG) x one , using left mammary artery;  Surgeon: Ivin Poot, MD;  Location: [facility];  Service: Open Heart Surgery;  Laterality: N/A;  . JOINT REPLACEMENT Right    Total Knee Replacement  . TEE WITHOUT CARDIOVERSION N/A 04/01/2015   Procedure: TRANSESOPHAGEAL ECHOCARDIOGRAM (TEE);  Surgeon: Ivin Poot, MD;  Location: [facility];  Service: Open Heart Surgery;  Laterality: N/A;  . TONSILLECTOMY    . TOTAL KNEE ARTHROPLASTY Right 06/10/2014   Procedure: TOTAL KNEE ARTHROPLASTY;  Surgeon: Dereck Leep, MD;  Location: [facility] OR[facility];  Service: Orthopedics;  Laterality: Right;  . TOTAL KNEE ARTHROPLASTY Left 09/02/2014   Procedure: TOTAL KNEE ARTHROPLASTY;  Surgeon: Dereck Leep, MD;  Location: [facility] OR[facility];  Service: Orthopedics;  Laterality: Left;    There were no vitals filed for this visit.   Subjective Assessment - 11/27/19 1107    Subjective Pt reports that she is doing well today. No changes since initial  evaluation. Continues with vertigo. No falls. No specific questions or concerns.    Pertinent History Pt reports that she was referred to Dr. Manuella Ghazi in 2019 because she was having tremors, vertigo, and 8 sycopal episodes over the period of 2 weeks. Per pt she states that the neurologist believed that her syncopal episodes were related to seizures. She reports that she had a 3 day EEG and reports that it did not show any seizures. She is now on anti seizure meds and has not had any more syncopal episodes recently. Due to the dizziness she states that she was referred to [facility] and reports that they did a VNG study which was normal. She reports that if she is laying in bed sometimes  she will get vertigo which will last for anywhere from 30s to 1 minute. Symptoms had improved but started back a couple days ago. She reports that she would like to work on her balance with therapy. Per neurologist note he is considering that her symptoms could be a component of vestibular migraine as she has a history of migraine headaches in her 20's. She had a brain MRI performed 07/29/2019, which showed mild to moderate white matter microvascular ischemic and metabolic changes which are likely age appropriate, otherwise unremarkable MRI of the brain. Dr. Trena Platt note reports old lacunar stroke in right cerebellum and mild microvascular ischemic changes in frontal subcortical regions. She is currently taking Gabapentin for peripheral neuropathy which is causing imbalance and sensory ataxia. She states that the neuropathy is caused by a genetic B12 deficiency. Hx of a-fib which is mostly managed by Dr. Sabra Heck but she has seen Dr. Ubaldo Glassing with cardiology. Neurology notes indicated pt has a benign essential tremor in BUE.  PMH also includes arthritis, CHF, COPD, GERD, hypertension, and R sided breast cancer with radiation treatment.    Limitations Walking    Diagnostic tests Brain MRI: see history    Patient Stated Goals Improve balance and dizziness    Currently in Pain? Other (Comment)   Unrelated to current episode            TREATMENT   Canalith Repositioning Treatment Performed Dix-Hallpike test with patient and it is positive on the L for very mild vertigo with faint upbeating L torsional nystagmus. Negative on the R side. Performed roll test twice on each side with patient and it is negative both times for both vertigo and nystagmus. Pt treated with 2 bouts of Epley Maneuver for presumed L posterior canal BPPV. 2 minute holds in each position and retesting between maneuvers as well as after final maneuver. After first maneuver pt continues to present with improved but a couple beats of upbeating L  torsional nystagmus and pt reports barely perceptible vertigo. After second maneuver pt is negative for both vertigo and nystagmus in L Dix-Hallpike position. Repeated roll test which is negative bilaterally. Pt and husband provided education regarding BPPV. Pt encouraged to follow-up as scheduled at which time will recheck BPPV tests and treat as appropriate until clear.                           PT Short Term Goals - 11/21/19 1246      PT SHORT TERM GOAL #1   Title Pt will be independent with HEP in order to improve strength and balance and decrease dizziness in order to decrease fall risk and improve function at home.    Time 6  Status New    Target Date 01/02/20             PT Long Term Goals - 11/21/19 1246      PT LONG TERM GOAL #1   Title Pt will improve ABC by at least 13% in order to demonstrate clinically significant improvement in balance confidence.    Baseline 11/21/19: 20.625%    Time 12    Period Weeks    Status New    Target Date 02/13/20      PT LONG TERM GOAL #2   Title Pt will decrease DHI score by at least 18 points in order to demonstrate clinically significant reduction in disability    Baseline 11/21/19: 84/100    Time 12    Period Weeks    Status New    Target Date 02/13/20      PT LONG TERM GOAL #3   Title Pt will report no further episodes of vertigo when laying in bed    Time 12    Period Weeks    Status New    Target Date 02/13/20                 Plan - 11/27/19 1108    Clinical Impression Statement Performed Dix-Hallpike test with patient and it is positive on the L for very mild vertigo with faint upbeating L torsional nystagmus. Negative on the R side. Performed roll test twice on each side with patient and it is negative both times for both vertigo and nystagmus. Pt treated with 2 bouts of Epley Maneuver for presumed L posterior canal BPPV. 2 minute holds in each position and retesting between maneuvers as well  as after final maneuver. After first maneuver pt continues to present with improved but a couple beats of upbeating L torsional nystagmus and pt reports barely perceptible vertigo. After second maneuver pt is negative for both vertigo and nystagmus in L Dix-Hallpike position. Repeated roll test which is negative bilaterally. Pt and husband provided education regarding BPPV. Pt encouraged to follow-up as scheduled at which time will recheck BPPV tests and treat as appropriate until clear.    Personal Factors and Comorbidities Comorbidity 2    Comorbidities HTN, OA, migraines, COPD, CAD s/p CABG, anxiety    Examination-Activity Limitations Bed Mobility;Locomotion Level    Examination-Participation Restrictions Cleaning;Community Activity;Meal Prep;Shop;Yard Work    Stability/Clinical Decision Making Unstable/Unpredictable    Rehab Potential Good    PT Frequency 2x / week    PT Duration 12 weeks    PT Treatment/Interventions ADLs/Self Care Home Management;Aquatic Therapy;Biofeedback;Canalith Repostioning;Cryotherapy;Electrical Stimulation;Iontophoresis 4mg /ml Dexamethasone;Moist Heat;Traction;Ultrasound;DME Instruction;Gait training;Stair training;Functional mobility training;Therapeutic activities;Therapeutic exercise;Balance training;Neuromuscular re-education;Cognitive remediation;Patient/family education;Manual techniques;Passive range of motion;Dry needling;Vestibular;Spinal Manipulations;Joint Manipulations    PT Next Visit Plan Recheck BPPV tests. (initial evaluation positive for L horizontal cupulolithiasis, first treatment appt positive for L posterior canal)    PT Home Exercise Plan None currently    Consulted and Agree with Plan of Care Patient           Patient will benefit from skilled therapeutic intervention in order to improve the following deficits and impairments:  Decreased balance, Dizziness  Visit Diagnosis: Unsteadiness on feet  Dizziness and giddiness     Problem  List Patient Active Problem List   Diagnosis Date Noted  . Peripheral neuropathy, idiopathic 07/11/2017  . Seizure disorder ([place]) 06/12/2017  . Syncope 05/25/2017  . Medicare annual wellness visit, initial 02/24/2016  . Chest pain 04/01/2015  . CAD in native  artery 03/10/2015  . Unstable angina pectoris ([place]) 02/23/2015  . Benign essential HTN 10/20/2014  . Chronic anxiety 10/20/2014  . Anxiety, generalized 10/20/2014  . Insomnia, persistent 10/20/2014  . Recurrent major depressive episodes ([place]) 10/20/2014  . Depression, major, single episode, severe ([place]) 10/20/2014  . Combined fat and carbohydrate induced hyperlipemia 10/20/2014  . Restless leg syndrome 10/20/2014  . MDD (major depressive disorder), recurrent episode, moderate ([place]) 10/20/2014  . AF (paroxysmal atrial fibrillation) ([place]) 07/31/2014  . Paroxysmal atrial fibrillation ([place]) 07/31/2014  . H/O total knee replacement 06/25/2014  . Total knee replacement status 06/10/2014  . H/O malignant neoplasm of breast 05/24/2014  . Restless leg 05/24/2014  . H/O: HTN (hypertension) 05/24/2014  . History of atrial fibrillation 05/24/2014  . CAFL (chronic airflow limitation) ([place]) 05/24/2014  . Vitamin B12 deficiency 05/24/2014  . H/O disease 05/24/2014  . H/O: hypothyroidism 05/24/2014  . H/O gastrointestinal disease 05/24/2014  . [place]ison anemia 02/16/2014  . Anxiety 11/06/2013  . A-fib ([place]) 11/06/2013  . Clinical depression 11/06/2013  . Acid reflux 11/06/2013  . Heart valve disease 11/06/2013  . Primary cancer of right female breast ([place]) 11/06/2013  . HLD (hyperlipidemia) 11/06/2013  . BP (high blood pressure) 11/06/2013  . Atrial fibrillation ([place]) 11/06/2013  . Chronic obstructive pulmonary disease ([place]) 11/06/2013  . Hormone receptor positive malignant neoplasm of breast ([place]) 11/06/2013  . Adult hypothyroidism 06/29/2010  . Adult BMI 30+ 06/29/2010  . B-complex deficiency 12/05/1995  . Extremity pain 12/05/1995    Phillips Grout PT, DPT, GCS  Onofrio Klemp 11/27/2019, 1:40 PM  [place] MAIN [facility] SERVICES [address] [place], [place], [address] Phone: [phone]   Fax:  [phone]  Name: Tonya Walton MRN: [id number] Date of Birth: 12-01-1950

## 2019-12-02 ENCOUNTER — Ambulatory Visit: Payer: PPO

## 2019-12-03 DIAGNOSIS — R42 Dizziness and giddiness: Secondary | ICD-10-CM | POA: Diagnosis not present

## 2019-12-03 DIAGNOSIS — G609 Hereditary and idiopathic neuropathy, unspecified: Secondary | ICD-10-CM | POA: Diagnosis not present

## 2019-12-03 DIAGNOSIS — R259 Unspecified abnormal involuntary movements: Secondary | ICD-10-CM | POA: Diagnosis not present

## 2019-12-03 DIAGNOSIS — R55 Syncope and collapse: Secondary | ICD-10-CM | POA: Diagnosis not present

## 2019-12-04 ENCOUNTER — Ambulatory Visit: Payer: PPO

## 2019-12-05 ENCOUNTER — Other Ambulatory Visit: Payer: Self-pay

## 2019-12-05 ENCOUNTER — Ambulatory Visit: Payer: PPO | Attending: Neurology

## 2019-12-05 DIAGNOSIS — R42 Dizziness and giddiness: Secondary | ICD-10-CM | POA: Insufficient documentation

## 2019-12-05 DIAGNOSIS — R2681 Unsteadiness on feet: Secondary | ICD-10-CM | POA: Insufficient documentation

## 2019-12-05 NOTE — Therapy (Signed)
[place] MAIN [facility] SERVICES [address] [place], [place], [address] Phone: [phone]   Fax:  [phone]  Physical Therapy Treatment/Discharge  Patient Details  Name: Tonya Walton MRN: [id number] Date of Birth: 1950-03-11 Referring Provider (PT): Dr. Manuella Ghazi   Encounter Date: 12/05/2019   PT End of Session - 12/05/19 0926    Visit Number 3    Number of Visits 25    Date for PT Re-Evaluation 02/13/20    Authorization Type eval: 11/21/19    PT Start Time 0930    PT Stop Time 1015    PT Time Calculation (min) 45 min    Activity Tolerance Patient tolerated treatment well    Behavior During Therapy [facility] for tasks assessed/performed           Past Medical History:  Diagnosis Date  . Anxiety   . Arthritis   . Atrial fibrillation, controlled ([place])   . Breast cancer ([place]) 2012   RT LUMPECTOMY  . CHF (congestive heart failure) ([place])   . COPD (chronic obstructive pulmonary disease) ([place])   . Coronary artery disease   . Depression   . Dysrhythmia    Afib  . GERD (gastroesophageal reflux disease)   . Headache   . Hypertension   . Hypothyroidism   . Neuromuscular disorder ([place])    nerve damage in legs  . Personal history of radiation therapy 2012   BREAST CA  . Restless leg syndrome   . Seizures ([place])   . Shortness of breath dyspnea     Past Surgical History:  Procedure Laterality Date  . APPENDECTOMY    . BREAST BIOPSY Left 07/2013   NEG  . BREAST BIOPSY Right 08/22/2016   benign  . BREAST BIOPSY Right 01/07/2019   asymmetry bx with stereo, coil marker, path pending  . BREAST BIOPSY Right 01/07/2019   asymm affirm bx,  x marker,  path pending  . BREAST EXCISIONAL BIOPSY Right 2012   POS  . BREAST LUMPECTOMY     Right breast  . CARDIAC CATHETERIZATION N/A 03/02/2015   Procedure: Left Heart Cath and Coronary Angiography;  Surgeon: Teodoro Spray, MD;  Location: [facility];  Service: Cardiovascular;  Laterality:  N/A;  . CATARACT EXTRACTION W/PHACO Right 10/14/2014   Procedure: CATARACT EXTRACTION PHACO AND INTRAOCULAR LENS PLACEMENT (IOC);  Surgeon: Leandrew Koyanagi, MD;  Location: [place];  Service: Ophthalmology;  Laterality: Right;  . CORONARY ARTERY BYPASS GRAFT N/A 04/01/2015   Procedure: CORONARY ARTERY BYPASS GRAFTING (CABG) x one , using left mammary artery;  Surgeon: Ivin Poot, MD;  Location: [facility];  Service: Open Heart Surgery;  Laterality: N/A;  . JOINT REPLACEMENT Right    Total Knee Replacement  . TEE WITHOUT CARDIOVERSION N/A 04/01/2015   Procedure: TRANSESOPHAGEAL ECHOCARDIOGRAM (TEE);  Surgeon: Ivin Poot, MD;  Location: [facility];  Service: Open Heart Surgery;  Laterality: N/A;  . TONSILLECTOMY    . TOTAL KNEE ARTHROPLASTY Right 06/10/2014   Procedure: TOTAL KNEE ARTHROPLASTY;  Surgeon: Dereck Leep, MD;  Location: [facility];  Service: Orthopedics;  Laterality: Right;  . TOTAL KNEE ARTHROPLASTY Left 09/02/2014   Procedure: TOTAL KNEE ARTHROPLASTY;  Surgeon: Dereck Leep, MD;  Location: [facility];  Service: Orthopedics;  Laterality: Left;    There were no vitals filed for this visit.   Subjective Assessment - 12/05/19 0925    Subjective Pt reports that she is doing well today. Her vertigo is almost  completely resolved and is barely perceptible if at all at this time. No falls. No specific questions or concerns.    Pertinent History Pt reports that she was referred to Dr. Manuella Ghazi in 2019 because she was having tremors, vertigo, and 8 sycopal episodes over the period of 2 weeks. Per pt she states that the neurologist believed that her syncopal episodes were related to seizures. She reports that she had a 3 day EEG and reports that it did not show any seizures. She is now on anti seizure meds and has not had any more syncopal episodes recently. Due to the dizziness she states that she was referred to [facility] and reports that they did a VNG study which was normal. She  reports that if she is laying in bed sometimes she will get vertigo which will last for anywhere from 30s to 1 minute. Symptoms had improved but started back a couple days ago. She reports that she would like to work on her balance with therapy. Per neurologist note he is considering that her symptoms could be a component of vestibular migraine as she has a history of migraine headaches in her 20's. She had a brain MRI performed 07/29/2019, which showed mild to moderate white matter microvascular ischemic and metabolic changes which are likely age appropriate, otherwise unremarkable MRI of the brain. Dr. Trena Platt note reports old lacunar stroke in right cerebellum and mild microvascular ischemic changes in frontal subcortical regions. She is currently taking Gabapentin for peripheral neuropathy which is causing imbalance and sensory ataxia. She states that the neuropathy is caused by a genetic B12 deficiency. Hx of a-fib which is mostly managed by Dr. Sabra Heck but she has seen Dr. Ubaldo Glassing with cardiology. Neurology notes indicated pt has a benign essential tremor in BUE.  PMH also includes arthritis, CHF, COPD, GERD, hypertension, and R sided breast cancer with radiation treatment.    Limitations Walking    Diagnostic tests Brain MRI: see history    Patient Stated Goals Improve balance and dizziness    Currently in Pain? Other (Comment)   Unrelated to current episode             [facility] PT Assessment - 12/05/19 1005      Standardized Balance Assessment   Standardized Balance Assessment Berg Balance Test;Dynamic Gait Index      Berg Balance Test   Sit to Stand Able to stand without using hands and stabilize independently    Standing Unsupported Able to stand safely 2 minutes    Sitting with Back Unsupported but Feet Supported on Floor or Stool Able to sit safely and securely 2 minutes    Stand to Sit Sits safely with minimal use of hands    Transfers Able to transfer safely, minor use of hands     Standing Unsupported with Eyes Closed Able to stand 10 seconds with supervision    Standing Unsupported with Feet Together Able to place feet together independently and stand 1 minute safely    From Standing, Reach Forward with Outstretched Arm Can reach forward >12 cm safely (5")    From Standing Position, Pick up Object from Floor Able to pick up shoe safely and easily    From Standing Position, Turn to Look Behind Over each Shoulder Looks behind from both sides and weight shifts well    Turn 360 Degrees Able to turn 360 degrees safely in 4 seconds or less    Standing Unsupported, Alternately Place Feet on Step/Stool Able to stand independently  and safely and complete 8 steps in 20 seconds    Standing Unsupported, One Foot in Front Able to plae foot ahead of the other independently and hold 30 seconds    Standing on One Leg Able to lift leg independently and hold equal to or more than 3 seconds    Total Score 51      Dynamic Gait Index   Level Surface Normal    Change in Gait Speed Normal    Gait with Horizontal Head Turns Normal    Gait with Vertical Head Turns Mild Impairment    Gait and Pivot Turn Normal    Step Over Obstacle Normal    Step Around Obstacles Normal    Steps Mild Impairment    Total Score 22             TREATMENT   Neuromuscular Re-education  Performed Dix-Hallpike test and roll tests with patient and they are negative bilaterally; Performed outcome measures with patient (see below); Education about HEP and discharge;   FUNCTIONAL OUTCOME MEASURES   1022/21 12/05/19 Comments  BERG Deferred 51/56 Mild deficits  DGI Deferred 22/24 WNL  TUG Deferred 8.1s WNL  5TSTS Deferred 12.4s WNL  10 Meter Gait Speed Deferred Self-selected: 11.2s = 0.89 m/s Fastest: 7.7s = 1.45m/s  Mild deficit with self-selected however fastest WNL  ABC Scale 20.625% 91.25% WNL  DHI 84/100 12/100 Low perception of disability  FOTO 48 69 Achieved, Predicted improvement to 53       Canalith Repositioning Treatment Dix-Hallpike and roll tests were negative bilaterally for both vertigo and nystagmus however given history of previous positive L Dix-Hallpike test pt taken through one bout of Epley Maneuver for L side. 2 minute holds in each position and retesting after maneuver. Retesting is negative for both vertigo and nystagmus.    Dix-Hallpike and roll tests were negative bilaterally today for both vertigo and nystagmus however given history of previous positive L Dix-Hallpike test pt taken through one bout of Epley Maneuver for L side. 2 minute holds in each position and retesting after maneuver. Retesting is negative for both vertigo and nystagmus.  It appears that BPPV is fully resolved at this time.  Performed additional balance test with patient today to assess fall risk.  Patient did well with balance test scoring 51/56 on the BERG, 22/24 on the DGI, 12.4 seconds on the 5 times sit to stand, and 8.1 seconds on the TUG  Her fastest gait speed is 1.3 m/s which is within normal limits for full community ambulation.  ABC scale improved from 20.625% at initial evaluation to 91.25% today.  DHI decreased from 84/100 212/100.  Her FOTO score also improved from 48 to 69.  Reviewed some exercises the patient can perform at home to continue working on her balance such as single-leg balance tandem balance and utilizing her half foam roller to practice on unstable surfaces.  Patient will be discharged at this time.  Patient encouraged to follow-up as needed if symptoms recur.                        PT Short Term Goals - 12/05/19 1028      PT SHORT TERM GOAL #1   Title Pt will be independent with HEP in order to improve strength and balance and decrease dizziness in order to decrease fall risk and improve function at home.    Time 6    Status Achieved  PT Long Term Goals - 12/05/19 0943      PT LONG TERM GOAL #1   Title Pt will improve  ABC by at least 13% in order to demonstrate clinically significant improvement in balance confidence.    Baseline 11/21/19: 20.625%, 12/05/19: 91.25%    Time 12    Period Weeks    Status Achieved      PT LONG TERM GOAL #2   Title Pt will decrease DHI score by at least 18 points in order to demonstrate clinically significant reduction in disability    Baseline 11/21/19: 84/100; 12/05/19: 12/100    Time 12    Period Weeks    Status New      PT LONG TERM GOAL #3   Title Pt will report no further episodes of vertigo when laying in bed    Baseline 12/05/19: Symptoms resolved    Time 12    Period Weeks    Status Achieved                 Plan - 12/05/19 0926    Clinical Impression Statement Dix-Hallpike and roll tests were negative bilaterally today for both vertigo and nystagmus however given history of previous positive L Dix-Hallpike test pt taken through one bout of Epley Maneuver for L side. 2 minute holds in each position and retesting after maneuver. Retesting is negative for both vertigo and nystagmus.  It appears that BPPV is fully resolved at this time.  Performed additional balance test with patient today to assess fall risk.  Patient did well with balance test scoring 51/56 on the BERG, 22/24 on the DGI, 12.4 seconds on the 5 times sit to stand, and 8.1 seconds on the TUG  Her fastest gait speed is 1.3 m/s which is within normal limits for full community ambulation.  ABC scale improved from 20.625% at initial evaluation to 91.25% today.  DHI decreased from 84/100 212/100.  Her FOTO score also improved from 48 to 69.  Reviewed some exercises the patient can perform at home to continue working on her balance such as single-leg balance tandem balance and utilizing her half foam roller to practice on unstable surfaces.  Patient will be discharged at this time.  Patient encouraged to follow-up as needed if symptoms recur.    Personal Factors and Comorbidities Comorbidity 2     Comorbidities HTN, OA, migraines, COPD, CAD s/p CABG, anxiety    Examination-Activity Limitations Bed Mobility;Locomotion Level    Examination-Participation Restrictions Cleaning;Community Activity;Meal Prep;Shop;Yard Work    Stability/Clinical Decision Making Unstable/Unpredictable    Rehab Potential Good    PT Frequency 2x / week    PT Duration 12 weeks    PT Treatment/Interventions ADLs/Self Care Home Management;Aquatic Therapy;Biofeedback;Canalith Repostioning;Cryotherapy;Electrical Stimulation;Iontophoresis 4mg /ml Dexamethasone;Moist Heat;Traction;Ultrasound;DME Instruction;Gait training;Stair training;Functional mobility training;Therapeutic activities;Therapeutic exercise;Balance training;Neuromuscular re-education;Cognitive remediation;Patient/family education;Manual techniques;Passive range of motion;Dry needling;Vestibular;Spinal Manipulations;Joint Manipulations    PT Next Visit Plan Discharge    PT Home Exercise Plan tandem balance, single leg balance, 1/2 foam ballance    Consulted and Agree with Plan of Care Patient           Patient will benefit from skilled therapeutic intervention in order to improve the following deficits and impairments:  Decreased balance, Dizziness  Visit Diagnosis: Unsteadiness on feet  Dizziness and giddiness     Problem List Patient Active Problem List   Diagnosis Date Noted  . Peripheral neuropathy, idiopathic 07/11/2017  . Seizure disorder ([place]) 06/12/2017  . Syncope 05/25/2017  . Medicare annual  wellness visit, initial 02/24/2016  . Chest pain 04/01/2015  . CAD in native artery 03/10/2015  . Unstable angina pectoris ([place]) 02/23/2015  . Benign essential HTN 10/20/2014  . Chronic anxiety 10/20/2014  . Anxiety, generalized 10/20/2014  . Insomnia, persistent 10/20/2014  . Recurrent major depressive episodes ([place]) 10/20/2014  . Depression, major, single episode, severe ([place]) 10/20/2014  . Combined fat and carbohydrate induced  hyperlipemia 10/20/2014  . Restless leg syndrome 10/20/2014  . MDD (major depressive disorder), recurrent episode, moderate ([place]) 10/20/2014  . AF (paroxysmal atrial fibrillation) ([place]) 07/31/2014  . Paroxysmal atrial fibrillation ([place]) 07/31/2014  . H/O total knee replacement 06/25/2014  . Total knee replacement status 06/10/2014  . H/O malignant neoplasm of breast 05/24/2014  . Restless leg 05/24/2014  . H/O: HTN (hypertension) 05/24/2014  . History of atrial fibrillation 05/24/2014  . CAFL (chronic airflow limitation) ([place]) 05/24/2014  . Vitamin B12 deficiency 05/24/2014  . H/O disease 05/24/2014  . H/O: hypothyroidism 05/24/2014  . H/O gastrointestinal disease 05/24/2014  . [place]ison anemia 02/16/2014  . Anxiety 11/06/2013  . A-fib ([place]) 11/06/2013  . Clinical depression 11/06/2013  . Acid reflux 11/06/2013  . Heart valve disease 11/06/2013  . Primary cancer of right female breast ([place]) 11/06/2013  . HLD (hyperlipidemia) 11/06/2013  . BP (high blood pressure) 11/06/2013  . Atrial fibrillation ([place]) 11/06/2013  . Chronic obstructive pulmonary disease ([place]) 11/06/2013  . Hormone receptor positive malignant neoplasm of breast ([place]) 11/06/2013  . Adult hypothyroidism 06/29/2010  . Adult BMI 30+ 06/29/2010  . B-complex deficiency 12/05/1995  . Extremity pain 12/05/1995   Phillips Grout PT, DPT, GCS  Kinberly Perris 12/05/2019, 10:37 AM  [place] MAIN [facility] SERVICES [address] [place], [place], [address] Phone: [phone]   Fax:  [phone]  Name: JOHNNY LATU MRN: [id number] Date of Birth: 01/01/1951

## 2019-12-09 ENCOUNTER — Ambulatory Visit: Payer: PPO

## 2019-12-09 DIAGNOSIS — Z23 Encounter for immunization: Secondary | ICD-10-CM | POA: Diagnosis not present

## 2019-12-09 DIAGNOSIS — R300 Dysuria: Secondary | ICD-10-CM | POA: Diagnosis not present

## 2019-12-11 ENCOUNTER — Ambulatory Visit: Payer: PPO

## 2019-12-12 ENCOUNTER — Ambulatory Visit: Payer: PPO

## 2019-12-16 ENCOUNTER — Ambulatory Visit: Payer: PPO

## 2019-12-18 ENCOUNTER — Ambulatory Visit: Payer: PPO

## 2019-12-19 ENCOUNTER — Ambulatory Visit: Payer: PPO

## 2019-12-22 DIAGNOSIS — R300 Dysuria: Secondary | ICD-10-CM | POA: Diagnosis not present

## 2019-12-22 DIAGNOSIS — R350 Frequency of micturition: Secondary | ICD-10-CM | POA: Diagnosis not present

## 2020-01-02 ENCOUNTER — Ambulatory Visit: Payer: PPO

## 2020-01-02 DIAGNOSIS — R350 Frequency of micturition: Secondary | ICD-10-CM | POA: Diagnosis not present

## 2020-01-02 DIAGNOSIS — M109 Gout, unspecified: Secondary | ICD-10-CM | POA: Diagnosis not present

## 2020-01-02 DIAGNOSIS — N3000 Acute cystitis without hematuria: Secondary | ICD-10-CM | POA: Diagnosis not present

## 2020-01-08 DIAGNOSIS — N390 Urinary tract infection, site not specified: Secondary | ICD-10-CM | POA: Diagnosis not present

## 2020-01-08 DIAGNOSIS — R350 Frequency of micturition: Secondary | ICD-10-CM | POA: Diagnosis not present

## 2020-01-09 ENCOUNTER — Ambulatory Visit: Payer: PPO

## 2020-01-20 ENCOUNTER — Other Ambulatory Visit: Payer: Self-pay | Admitting: Internal Medicine

## 2020-01-20 DIAGNOSIS — N6489 Other specified disorders of breast: Secondary | ICD-10-CM

## 2020-02-16 ENCOUNTER — Ambulatory Visit: Payer: Medicare HMO

## 2020-02-24 ENCOUNTER — Other Ambulatory Visit: Payer: Self-pay

## 2020-02-24 ENCOUNTER — Other Ambulatory Visit: Payer: Self-pay | Admitting: Psychiatry

## 2020-02-24 ENCOUNTER — Ambulatory Visit
Admission: RE | Admit: 2020-02-24 | Discharge: 2020-02-24 | Disposition: A | Discharge status: Home / Self Care | Payer: Medicare HMO | Source: Ambulatory Visit | Attending: Internal Medicine | Admitting: Internal Medicine

## 2020-02-24 DIAGNOSIS — N6489 Other specified disorders of breast: Secondary | ICD-10-CM | POA: Diagnosis not present

## 2020-02-27 ENCOUNTER — Other Ambulatory Visit: Payer: Self-pay | Admitting: Internal Medicine

## 2020-02-27 DIAGNOSIS — R928 Other abnormal and inconclusive findings on diagnostic imaging of breast: Secondary | ICD-10-CM

## 2020-03-09 ENCOUNTER — Other Ambulatory Visit: Payer: Medicare HMO

## 2020-03-09 ENCOUNTER — Ambulatory Visit: Admission: RE | Admit: 2020-03-09 | Payer: Medicare HMO | Source: Ambulatory Visit

## 2020-03-17 ENCOUNTER — Ambulatory Visit
Admission: RE | Admit: 2020-03-17 | Discharge: 2020-03-17 | Disposition: A | Discharge status: Home / Self Care | Payer: Medicare HMO | Source: Ambulatory Visit | Attending: Internal Medicine | Admitting: Internal Medicine

## 2020-03-17 ENCOUNTER — Other Ambulatory Visit: Payer: Self-pay

## 2020-03-17 DIAGNOSIS — R928 Other abnormal and inconclusive findings on diagnostic imaging of breast: Secondary | ICD-10-CM | POA: Insufficient documentation

## 2020-03-17 MED ORDER — GADOBUTROL 1 MMOL/ML IV SOLN
10.0000 mL | Freq: Once | INTRAVENOUS | Status: AC | PRN
  Administered 2020-03-17: 10 mL via INTRAVENOUS

## 2020-03-18 ENCOUNTER — Other Ambulatory Visit: Payer: Self-pay | Admitting: Internal Medicine

## 2020-03-18 DIAGNOSIS — N6310 Unspecified lump in the right breast, unspecified quadrant: Secondary | ICD-10-CM

## 2020-03-18 DIAGNOSIS — R928 Other abnormal and inconclusive findings on diagnostic imaging of breast: Secondary | ICD-10-CM

## 2020-03-25 ENCOUNTER — Other Ambulatory Visit: Payer: Self-pay

## 2020-03-25 ENCOUNTER — Ambulatory Visit
Admission: RE | Admit: 2020-03-25 | Discharge: 2020-03-25 | Disposition: A | Discharge status: Home / Self Care | Payer: Medicare HMO | Source: Ambulatory Visit | Attending: Internal Medicine | Admitting: Internal Medicine

## 2020-03-25 DIAGNOSIS — R928 Other abnormal and inconclusive findings on diagnostic imaging of breast: Secondary | ICD-10-CM | POA: Insufficient documentation

## 2020-03-25 DIAGNOSIS — N6310 Unspecified lump in the right breast, unspecified quadrant: Secondary | ICD-10-CM | POA: Diagnosis present

## 2020-03-25 HISTORY — PX: BREAST BIOPSY: SHX20

## 2020-03-26 LAB — SURGICAL PATHOLOGY

## 2020-04-07 ENCOUNTER — Other Ambulatory Visit: Payer: Self-pay

## 2020-04-07 ENCOUNTER — Emergency Department
Admission: EM | Admit: 2020-04-07 | Discharge: 2020-04-07 | Disposition: A | Discharge status: Home / Self Care | Payer: Medicare HMO | Attending: Emergency Medicine | Admitting: Emergency Medicine

## 2020-04-07 ENCOUNTER — Emergency Department: Payer: Medicare HMO

## 2020-04-07 ENCOUNTER — Encounter: Payer: Self-pay | Admitting: Emergency Medicine

## 2020-04-07 DIAGNOSIS — Z7982 Long term (current) use of aspirin: Secondary | ICD-10-CM | POA: Diagnosis not present

## 2020-04-07 DIAGNOSIS — R519 Headache, unspecified: Secondary | ICD-10-CM | POA: Diagnosis not present

## 2020-04-07 DIAGNOSIS — Z853 Personal history of malignant neoplasm of breast: Secondary | ICD-10-CM | POA: Diagnosis not present

## 2020-04-07 DIAGNOSIS — Z7722 Contact with and (suspected) exposure to environmental tobacco smoke (acute) (chronic): Secondary | ICD-10-CM | POA: Diagnosis not present

## 2020-04-07 DIAGNOSIS — J449 Chronic obstructive pulmonary disease, unspecified: Secondary | ICD-10-CM | POA: Insufficient documentation

## 2020-04-07 DIAGNOSIS — Z96653 Presence of artificial knee joint, bilateral: Secondary | ICD-10-CM | POA: Diagnosis not present

## 2020-04-07 DIAGNOSIS — Z79899 Other long term (current) drug therapy: Secondary | ICD-10-CM | POA: Diagnosis not present

## 2020-04-07 DIAGNOSIS — I509 Heart failure, unspecified: Secondary | ICD-10-CM | POA: Diagnosis not present

## 2020-04-07 DIAGNOSIS — E039 Hypothyroidism, unspecified: Secondary | ICD-10-CM | POA: Diagnosis not present

## 2020-04-07 DIAGNOSIS — I110 Hypertensive heart disease with heart failure: Secondary | ICD-10-CM | POA: Diagnosis not present

## 2020-04-07 DIAGNOSIS — I2510 Atherosclerotic heart disease of native coronary artery without angina pectoris: Secondary | ICD-10-CM | POA: Diagnosis not present

## 2020-04-07 DIAGNOSIS — Z87891 Personal history of nicotine dependence: Secondary | ICD-10-CM | POA: Diagnosis not present

## 2020-04-07 DIAGNOSIS — R531 Weakness: Secondary | ICD-10-CM | POA: Diagnosis present

## 2020-04-07 DIAGNOSIS — Z951 Presence of aortocoronary bypass graft: Secondary | ICD-10-CM | POA: Diagnosis not present

## 2020-04-07 DIAGNOSIS — R202 Paresthesia of skin: Secondary | ICD-10-CM | POA: Diagnosis not present

## 2020-04-07 LAB — BASIC METABOLIC PANEL
Anion gap: 11 (ref 5–15)
BUN: 23 mg/dL (ref 8–23)
CO2: 23 mmol/L (ref 22–32)
Calcium: 9.2 mg/dL (ref 8.9–10.3)
Chloride: 108 mmol/L (ref 98–111)
Creatinine, Ser: 0.94 mg/dL (ref 0.44–1.00)
GFR, Estimated: >60 mL/min (ref >60)
Glucose, Bld: 120 mg/dL — ABNORMAL HIGH (ref 70–99)
Potassium: 3.9 mmol/L (ref 3.5–5.1)
Sodium: 142 mmol/L (ref 135–145)

## 2020-04-07 LAB — CBC
HCT: 38 % (ref 36.0–46.0)
Hemoglobin: 11.9 g/dL — ABNORMAL LOW (ref 12.0–15.0)
MCH: 28.5 pg (ref 26.0–34.0)
MCHC: 31.3 g/dL (ref 30.0–36.0)
MCV: 90.9 fL (ref 80.0–100.0)
Platelets: 299 10*3/uL (ref 150–400)
RBC: 4.18 MIL/uL (ref 3.87–5.11)
RDW: 13.9 % (ref 11.5–15.5)
WBC: 8.8 10*3/uL (ref 4.0–10.5)
nRBC: 0 % (ref 0.0–0.2)

## 2020-04-07 NOTE — ED Notes (Signed)
Pt back in room, was in CT. Pt started feeling numbness and weakness on L side about 3 weeks ago. Last night, L neck was painful and "a little numb". Pt spoke with her neurologist this morning (she has hx seizures, takes anti seizure med TID, does not remember name of med) and neurologist told her that she may have had a stroke "a few years ago". Pt had MRI about 2.5 years ago. Pt states has had nagging HA since mid-December 2021. EDP at bedside.

## 2020-04-07 NOTE — ED Notes (Signed)
Pt walked to toilet in room with steady gait. Pt voided in toilet but missed collection container. Pt back in bed, states will try again soon.

## 2020-04-07 NOTE — ED Triage Notes (Signed)
Pt comes into the ED via POV c/o left side weakness and neck pain x 3 days.  Pt denies any CP, SHOB, N/V.  Pt is neurologically intact at this time.  Pt has no facial droop.  Pt does admit that she has had a headache for several months.  Pt has a h/o of seizures and called her neurologist who told her to come in.

## 2020-04-07 NOTE — ED Provider Notes (Signed)
[facility] Emergency Department Provider Note  Time seen: 10:25 AM  I have reviewed the triage vital signs and the nursing notes.   HISTORY  Chief Complaint Weakness   HPI Tonya Walton is a 70 y.o. female with a past medical history of anxiety, atrial fibrillation, CHF, COPD, CAD, gastric reflux, hypertension, presents to the emergency department for left-sided weakness and headache.  According to the patient for the past 3 months she has been experiencing a dull global headache.  She is seen her doctor for the same who told her that it could be a virus but it never went away.  States for the past 3 weeks she has been feeling numbness and slight weakness of her left arm and left leg.  Patient states last night she felt like the numbness was extending into her neck which scared her so she came to the emergency department today for evaluation.  Patient states she had an MRI approximately 3 years ago at that time showed an older stroke was unknown to the patient.  Patient states no chronic deficits.  Still able to ambulate without issue.   Past Medical History:  Diagnosis Date  . Anxiety   . Arthritis   . Atrial fibrillation, controlled ([place])   . Breast cancer ([place]) 2012   RT LUMPECTOMY  . CHF (congestive heart failure) ([place])   . COPD (chronic obstructive pulmonary disease) ([place])   . Coronary artery disease   . Depression   . Dysrhythmia    Afib  . GERD (gastroesophageal reflux disease)   . Headache   . Hypertension   . Hypothyroidism   . Neuromuscular disorder ([place])    nerve damage in legs  . Personal history of radiation therapy 2012   BREAST CA  . Restless leg syndrome   . Seizures ([place])   . Shortness of breath dyspnea     Patient Active Problem List   Diagnosis Date Noted  . Peripheral neuropathy, idiopathic 07/11/2017  . Seizure disorder ([place]) 06/12/2017  . Syncope 05/25/2017  . Medicare annual wellness visit, initial 02/24/2016  . Chest  pain 04/01/2015  . CAD in native artery 03/10/2015  . Unstable angina pectoris ([place]) 02/23/2015  . Benign essential HTN 10/20/2014  . Chronic anxiety 10/20/2014  . Anxiety, generalized 10/20/2014  . Insomnia, persistent 10/20/2014  . Recurrent major depressive episodes ([place]) 10/20/2014  . Depression, major, single episode, severe ([place]) 10/20/2014  . Combined fat and carbohydrate induced hyperlipemia 10/20/2014  . Restless leg syndrome 10/20/2014  . MDD (major depressive disorder), recurrent episode, moderate ([place]) 10/20/2014  . AF (paroxysmal atrial fibrillation) ([place]) 07/31/2014  . Paroxysmal atrial fibrillation ([place]) 07/31/2014  . H/O total knee replacement 06/25/2014  . Total knee replacement status 06/10/2014  . H/O malignant neoplasm of breast 05/24/2014  . Restless leg 05/24/2014  . H/O: HTN (hypertension) 05/24/2014  . History of atrial fibrillation 05/24/2014  . CAFL (chronic airflow limitation) ([place]) 05/24/2014  . Vitamin B12 deficiency 05/24/2014  . H/O disease 05/24/2014  . H/O: hypothyroidism 05/24/2014  . H/O gastrointestinal disease 05/24/2014  . Addison anemia 02/16/2014  . Anxiety 11/06/2013  . A-fib ([place]) 11/06/2013  . Clinical depression 11/06/2013  . Acid reflux 11/06/2013  . Heart valve disease 11/06/2013  . Primary cancer of right female breast ([place]) 11/06/2013  . HLD (hyperlipidemia) 11/06/2013  . BP (high blood pressure) 11/06/2013  . Atrial fibrillation ([place]) 11/06/2013  . Chronic obstructive pulmonary disease ([place]) 11/06/2013  . Hormone receptor positive malignant  neoplasm of breast ([place]) 11/06/2013  . Adult hypothyroidism 06/29/2010  . Adult BMI 30+ 06/29/2010  . B-complex deficiency 12/05/1995  . Extremity pain 12/05/1995    Past Surgical History:  Procedure Laterality Date  . APPENDECTOMY    . BREAST BIOPSY Left 07/2013   NEG  . BREAST BIOPSY Right 08/22/2016   benign  . BREAST BIOPSY Right 01/07/2019   asymmetry bx with stereo, coil  marker,fat necrosis  . BREAST BIOPSY Right 01/07/2019   asymm affirm bx,  x marker,  fat necrosis  . BREAST BIOPSY Right 03/25/2020   stereo bx, ribbon clip, path pending   . BREAST EXCISIONAL BIOPSY Right 2012   POS  . BREAST LUMPECTOMY  2012   Right breast  . CARDIAC CATHETERIZATION N/A 03/02/2015   Procedure: Left Heart Cath and Coronary Angiography;  Surgeon: Teodoro Spray, MD;  Location: [place] [facility];  Service: Cardiovascular;  Laterality: N/A;  . CATARACT EXTRACTION W/PHACO Right 10/14/2014   Procedure: CATARACT EXTRACTION PHACO AND INTRAOCULAR LENS PLACEMENT (IOC);  Surgeon: Leandrew Koyanagi, MD;  Location: [place];  Service: Ophthalmology;  Laterality: Right;  . CORONARY ARTERY BYPASS GRAFT N/A 04/01/2015   Procedure: CORONARY ARTERY BYPASS GRAFTING (CABG) x one , using left mammary artery;  Surgeon: Ivin Poot, MD;  Location: [place];  Service: Open Heart Surgery;  Laterality: N/A;  . JOINT REPLACEMENT Right    Total Knee Replacement  . TEE WITHOUT CARDIOVERSION N/A 04/01/2015   Procedure: TRANSESOPHAGEAL ECHOCARDIOGRAM (TEE);  Surgeon: Ivin Poot, MD;  Location: [place];  Service: Open Heart Surgery;  Laterality: N/A;  . TONSILLECTOMY    . TOTAL KNEE ARTHROPLASTY Right 06/10/2014   Procedure: TOTAL KNEE ARTHROPLASTY;  Surgeon: Dereck Leep, MD;  Location: [facility];  Service: Orthopedics;  Laterality: Right;  . TOTAL KNEE ARTHROPLASTY Left 09/02/2014   Procedure: TOTAL KNEE ARTHROPLASTY;  Surgeon: Dereck Leep, MD;  Location: [facility];  Service: Orthopedics;  Laterality: Left;    Prior to Admission medications   Medication Sig Start Date End Date Taking? Authorizing Provider  Artificial Tear Ointment (DRY EYES OP) Place 1 drop into both eyes at bedtime.    [provider]  aspirin EC 81 MG tablet Take 81 mg by mouth. 07/23/08   [provider]  atorvastatin (LIPITOR) 40 MG tablet Take 1 tablet (40 mg total) by mouth daily at 6 PM.  04/06/15   Barrett, Erin R, PA-C  Cholecalciferol (VITAMIN D3) 2000 units capsule Take 2,000 Units by mouth daily.     [provider]  clonazePAM (KLONOPIN) 0.5 MG tablet Take 0.5 tablets (0.25 mg total) by mouth at bedtime. 09/03/17   Rainey Pines, MD  cyclobenzaprine (FLEXERIL) 10 MG tablet Take 10 mg by mouth daily as needed for muscle spasms.     [provider]  esomeprazole (NEXIUM) 40 MG capsule Take 40 mg by mouth daily.  11/06/13   [provider]  FLUoxetine (PROZAC) 40 MG capsule Take 1 capsule (40 mg total) by mouth daily. 09/03/17   Rainey Pines, MD  folic acid (FOLVITE) 1 MG tablet Take 1 mg by mouth 2 (two) times a week.    [provider]  gabapentin (NEURONTIN) 300 MG capsule Take 300 mg by mouth.    [provider]  lamoTRIgine (LAMICTAL) 25 MG tablet TAKE 2 TABLETS BY MOUTH EVERY DAY 07/22/18   Rainey Pines, MD  levothyroxine (SYNTHROID, LEVOTHROID) 25 MCG tablet Take 25 mcg by mouth daily.  [provider]  loratadine (CLARITIN) 10 MG tablet Take 10 mg by mouth daily.     [provider]  meloxicam (MOBIC) 15 MG tablet Take 7.5 mg by mouth 2 (two) times daily.  07/15/15   [provider]  montelukast (SINGULAIR) 10 MG tablet Take 10 mg by mouth at bedtime.    [provider]  oxybutynin (DITROPAN) 5 MG tablet Take 5 mg by mouth 3 (three) times daily as needed for bladder spasms.    [provider]  pramipexole (MIRAPEX) 0.25 MG tablet Take 0.25-0.5 mg by mouth 3 (three) times daily. Pt takes two tablets in the morning, one tablet at noon, and two tablets at bedtime.    [provider]  vitamin B-12 (CYANOCOBALAMIN) 1000 MCG tablet Take 1,000 mcg by mouth daily.    [provider]  zolpidem (AMBIEN) 5 MG tablet TAKE 1 TABLET BY MOUTH AT BEDTIME 02/02/17   [provider]    No Known Allergies  Family History  Problem Relation Age of Onset  . Leukemia Mother   . Bone  cancer Father   . Heart disease Brother   . Heart disease Brother   . Diabetes Brother   . Hypertension Brother   . Diabetes Brother   . Hypertension Brother   . Heart disease Brother   . Breast cancer Paternal Aunt     Social History Social History   Tobacco Use  . Smoking status: Former Smoker    Packs/day: 1.00    Types: Cigarettes    Quit date: 04/30/1992    Years since quitting: 27.9  . Smokeless tobacco: Never Used  Vaping Use  . Vaping Use: Never used  Substance Use Topics  . Alcohol use: No  . Drug use: No    Review of Systems Constitutional: Negative for fever Cardiovascular: Negative for chest pain. Respiratory: Negative for shortness of breath. Gastrointestinal: Negative for abdominal pain Musculoskeletal: Negative for musculoskeletal complaints Neurological: Moderate headache x3 months.  States left-sided weakness and numbness x3 weeks. All other ROS negative  ____________________________________________   PHYSICAL EXAM:  VITAL SIGNS: ED Triage Vitals  Enc Vitals Group     BP 04/07/20 0937 (!) 142/89     Pulse Rate 04/07/20 0937 85     Resp 04/07/20 0937 18     Temp 04/07/20 0937 97.9 F (36.6 C)     Temp Source 04/07/20 0937 Oral     SpO2 04/07/20 0937 100 %     Weight 04/07/20 0937 232 lb (105.2 kg)     Height 04/07/20 0937 5\' 6"  (1.676 m)     Head Circumference --      Peak Flow --      Pain Score 04/07/20 0945 1     Pain Loc --      Pain Edu? --      Excl. in [place]? --    Constitutional: Alert and oriented. Well appearing, but quite anxious appearing. Eyes: Normal exam ENT      Head: Normocephalic and atraumatic.      Mouth/Throat: Mucous membranes are moist. Cardiovascular: Normal rate, regular rhythm. No murmur Respiratory: Normal respiratory effort without tachypnea nor retractions. Breath sounds are clear  Gastrointestinal: Soft and nontender. No distention. Musculoskeletal: Nontender with normal range of motion in all extremities.   Neurologic:  Normal speech and language.  5/5 motor in arms and legs.  No pronator drift.  No cranial nerve deficits.  Patient does state subjective decreased sensation to left  upper and lower extremity. Skin:  Skin is warm, dry and intact.  Psychiatric: Patient is anxious appearing.  ____________________________________________    EKG  EKG viewed and interpreted by myself shows a normal sinus rhythm 80 bpm with a narrow QRS, normal axis, normal intervals, no concerning ST changes.  ____________________________________________    RADIOLOGY  MRI is negative for acute abnormality.  ____________________________________________   INITIAL IMPRESSION / ASSESSMENT AND PLAN / ED COURSE  Pertinent labs & imaging results that were available during my care of the patient were reviewed by me and considered in my medical decision making (see chart for details).   Patient presents to the emergency department with headache x3 months and left-sided weakness and numbness x3 weeks.  On examination patient does have subjective numbness to the left upper and lower extremity.  No cranial nerve deficits.  No objective deficits.  Great grip strength bilaterally with no pronator drift.  5/5 motor in all extremities.  CT scan on my review does not appear to show any acute abnormalities, waiting on official radiology read.  However given the patient's symptoms we will proceed with MRI of the brain to further evaluate.  Patient agreeable plan of care.  Lab work largely within normal limits.  MRI negative for acute abnormality.  NARELLE SCHOENING was evaluated in Emergency Department on 04/07/2020 for the symptoms described in the history of present illness. She was evaluated in the context of the global COVID-19 pandemic, which necessitated consideration that the patient might be at risk for infection with the SARS-CoV-2 virus that causes COVID-19. Institutional protocols and algorithms that pertain to the  evaluation of patients at risk for COVID-19 are in a state of rapid change based on information released by regulatory bodies including the CDC and federal and state organizations. These policies and algorithms were followed during the patient's care in the ED.  ____________________________________________   FINAL CLINICAL IMPRESSION(S) / ED DIAGNOSES  Paresthesia   Harvest Dark, MD 04/07/20 1427

## 2020-04-07 NOTE — ED Notes (Signed)
Pt to MRI

## 2020-04-07 NOTE — ED Notes (Signed)
Pt denies need to void at this time for urine specimen. Pt will inform nurse when she is able to void.

## 2020-04-07 NOTE — ED Notes (Signed)
Provided phone for pt to speak with MRI screener.

## 2020-04-07 NOTE — Discharge Instructions (Addendum)
Your work-up today is reassuring including an MRI of your brain.  Please follow-up with neurology as soon as possible to discuss further work-up and treatment.  Return to the emergency department for any symptom personally concerning to yourself.

## 2020-04-22 ENCOUNTER — Encounter: Payer: Self-pay | Admitting: Urology

## 2020-04-22 ENCOUNTER — Other Ambulatory Visit: Payer: Self-pay

## 2020-04-22 ENCOUNTER — Ambulatory Visit: Payer: Medicare HMO | Admitting: Urology

## 2020-04-22 VITALS — BP 150/80 | HR 81 | Ht 66.0 in | Wt 229.0 lb

## 2020-04-22 DIAGNOSIS — N390 Urinary tract infection, site not specified: Secondary | ICD-10-CM | POA: Diagnosis not present

## 2020-04-22 DIAGNOSIS — R3129 Other microscopic hematuria: Secondary | ICD-10-CM | POA: Diagnosis not present

## 2020-04-22 NOTE — Progress Notes (Unsigned)
04/22/2020 8:33 AM   Tonya Walton February 04, 1950 [id number]  Referring provider: Vladimir Crofts, MD [facility] [place],  [place] [address]  Chief Complaint  Patient presents with  . Recurrent UTI    HPI: Tonya Walton is a 70 y.o. female referred for recurrent UTI.   States she was diagnosed with a urinary tract infection November 2021 and has been treated for 2 more recurrent UTIs since that time  Recently finished a course of antibiotics  Denies prior history with recurrent UTIs  Symptoms included mild dysuria, pelvic pressure and urge incontinence  Urine culture was positive for E. coli in November 2021; follow-up culture late November and early December grew mixed flora  Urinalysis and early February 2022 showed 10-50 RBCs and no WBCs  UA repeated mid February with similar findings and urine culture + Enterococcus  Last urinalysis performed 04/12/2020 showed greater than 50 RBCs and urine culture was negative  Denies gross hematuria   PMH: Past Medical History:  Diagnosis Date  . Anxiety   . Arthritis   . Atrial fibrillation, controlled ([place])   . Breast cancer ([place]) 2012   RT LUMPECTOMY  . CHF (congestive heart failure) ([place])   . COPD (chronic obstructive pulmonary disease) ([place])   . Coronary artery disease   . Depression   . Dysrhythmia    Afib  . GERD (gastroesophageal reflux disease)   . Headache   . Hypertension   . Hypothyroidism   . Neuromuscular disorder ([place])    nerve damage in legs  . Personal history of radiation therapy 2012   BREAST CA  . Restless leg syndrome   . Seizures ([place])   . Shortness of breath dyspnea     Surgical History: Past Surgical History:  Procedure Laterality Date  . APPENDECTOMY    . BREAST BIOPSY Left 07/2013   NEG  . BREAST BIOPSY Right 08/22/2016   benign  . BREAST BIOPSY Right 01/07/2019   asymmetry bx with stereo, coil marker,fat necrosis  . BREAST BIOPSY Right  01/07/2019   asymm affirm bx,  x marker,  fat necrosis  . BREAST BIOPSY Right 03/25/2020   stereo bx, ribbon clip, path pending   . BREAST EXCISIONAL BIOPSY Right 2012   POS  . BREAST LUMPECTOMY  2012   Right breast  . CARDIAC CATHETERIZATION N/A 03/02/2015   Procedure: Left Heart Cath and Coronary Angiography;  Surgeon: Teodoro Spray, MD;  Location: [place] [facility];  Service: Cardiovascular;  Laterality: N/A;  . CATARACT EXTRACTION W/PHACO Right 10/14/2014   Procedure: CATARACT EXTRACTION PHACO AND INTRAOCULAR LENS PLACEMENT (IOC);  Surgeon: Leandrew Koyanagi, MD;  Location: [facility];  Service: Ophthalmology;  Laterality: Right;  . CORONARY ARTERY BYPASS GRAFT N/A 04/01/2015   Procedure: CORONARY ARTERY BYPASS GRAFTING (CABG) x one , using left mammary artery;  Surgeon: Ivin Poot, MD;  Location: [facility];  Service: Open Heart Surgery;  Laterality: N/A;  . JOINT REPLACEMENT Right    Total Knee Replacement  . TEE WITHOUT CARDIOVERSION N/A 04/01/2015   Procedure: TRANSESOPHAGEAL ECHOCARDIOGRAM (TEE);  Surgeon: Ivin Poot, MD;  Location: [facility];  Service: Open Heart Surgery;  Laterality: N/A;  . TONSILLECTOMY    . TOTAL KNEE ARTHROPLASTY Right 06/10/2014   Procedure: TOTAL KNEE ARTHROPLASTY;  Surgeon: Dereck Leep, MD;  Location: [facility];  Service: Orthopedics;  Laterality: Right;  . TOTAL KNEE ARTHROPLASTY Left 09/02/2014   Procedure: TOTAL KNEE ARTHROPLASTY;  Surgeon:  Dereck Leep, MD;  Location: [facility];  Service: Orthopedics;  Laterality: Left;    Home Medications:  Allergies as of 04/22/2020   No Known Allergies     Medication List       Accurate as of April 22, 2020  8:33 AM. If you have any questions, ask your nurse or doctor.        aspirin EC 81 MG tablet Take 81 mg by mouth.   atorvastatin 40 MG tablet Commonly known as: LIPITOR Take 1 tablet (40 mg total) by mouth daily at 6 PM.   clonazePAM 0.5 MG tablet Commonly known as: KlonoPIN Take 0.5  tablets (0.25 mg total) by mouth at bedtime.   cyclobenzaprine 10 MG tablet Commonly known as: FLEXERIL Take 10 mg by mouth daily as needed for muscle spasms.   DRY EYES OP Place 1 drop into both eyes at bedtime.   esomeprazole 40 MG capsule Commonly known as: NEXIUM Take 40 mg by mouth daily.   FLUoxetine 40 MG capsule Commonly known as: PROZAC Take 1 capsule (40 mg total) by mouth daily.   folic acid 1 MG tablet Commonly known as: FOLVITE Take 1 mg by mouth 2 (two) times a week.   gabapentin 300 MG capsule Commonly known as: NEURONTIN Take 300 mg by mouth.   lamoTRIgine 25 MG tablet Commonly known as: LAMICTAL TAKE 2 TABLETS BY MOUTH EVERY DAY   levothyroxine 25 MCG tablet Commonly known as: SYNTHROID Take 25 mcg by mouth daily.   loratadine 10 MG tablet Commonly known as: CLARITIN Take 10 mg by mouth daily.   meloxicam 15 MG tablet Commonly known as: MOBIC Take 7.5 mg by mouth 2 (two) times daily.   montelukast 10 MG tablet Commonly known as: SINGULAIR Take 10 mg by mouth at bedtime.   oxybutynin 5 MG tablet Commonly known as: DITROPAN Take 5 mg by mouth 3 (three) times daily as needed for bladder spasms.   pramipexole 0.25 MG tablet Commonly known as: MIRAPEX Take 0.25-0.5 mg by mouth 3 (three) times daily. Pt takes two tablets in the morning, one tablet at noon, and two tablets at bedtime.   vitamin B-12 1000 MCG tablet Commonly known as: CYANOCOBALAMIN Take 1,000 mcg by mouth daily.   Vitamin D3 50 MCG (2000 UT) capsule Take 2,000 Units by mouth daily.   zolpidem 5 MG tablet Commonly known as: AMBIEN TAKE 1 TABLET BY MOUTH AT BEDTIME       Allergies: No Known Allergies  Family History: Family History  Problem Relation Age of Onset  . Leukemia Mother   . Bone cancer Father   . Heart disease Brother   . Heart disease Brother   . Diabetes Brother   . Hypertension Brother   . Diabetes Brother   . Hypertension Brother   . Heart disease  Brother   . Breast cancer Paternal Aunt     Social History:  reports that she quit smoking about 27 years ago. Her smoking use included cigarettes. She smoked 1.00 pack per day. She has never used smokeless tobacco. She reports that she does not drink alcohol and does not use drugs.   Physical Exam: BP (!) 150/80   Pulse 81   Ht 5\' 6"  (1.676 m)   Wt 229 lb (103.9 kg)   BMI 36.96 kg/m   Constitutional:  Alert and oriented, No acute distress. HEENT:  AT, moist mucus membranes.  Trachea midline, no masses. Cardiovascular: No clubbing, cyanosis, or edema. Respiratory: Normal  respiratory effort, no increased work of breathing. Skin: No rashes, bruises or suspicious lesions. Neurologic: Grossly intact, no focal deficits, moving all 4 extremities. Psychiatric: Normal mood and affect.  Laboratory Data:  Urinalysis Dipstick trace blood/1+ protein Microscopy 3-10 RBC  Assessment & Plan:    1.  Microhematuria  Several of her urinalysis since November 2021 have shown significant microhematuria without pyuria and negative urine cultures  AUA hematuria risk stratification: High  I discussed the recommended evaluation for high risk hematuria to include CT urogram and cystoscopy  The procedures were discussed in detail and she has elected to pursue further work-up  2. Recurrent UTI  Urinalysis today with microhematuria  Since November 2021 she has had 2+ cultures and 3 -   Abbie Sons, MD  [place] [address], [place], [place] [address] [phone]

## 2020-04-23 ENCOUNTER — Encounter: Payer: Self-pay | Admitting: Urology

## 2020-04-23 LAB — URINALYSIS, COMPLETE
Bilirubin, UA: NEGATIVE
Glucose, UA: NEGATIVE
Ketones, UA: NEGATIVE
Leukocytes,UA: NEGATIVE
Nitrite, UA: NEGATIVE
Specific Gravity, UA: 1.025 (ref 1.005–1.030)
Urobilinogen, Ur: 0.2 mg/dL (ref 0.2–1.0)
pH, UA: 5.5 (ref 5.0–7.5)

## 2020-04-23 LAB — MICROSCOPIC EXAMINATION: Bacteria, UA: NONE SEEN

## 2020-05-12 ENCOUNTER — Other Ambulatory Visit: Payer: Self-pay

## 2020-05-12 ENCOUNTER — Ambulatory Visit
Admission: RE | Admit: 2020-05-12 | Discharge: 2020-05-12 | Disposition: A | Discharge status: Home / Self Care | Payer: Medicare HMO | Source: Ambulatory Visit | Attending: Urology | Admitting: Urology

## 2020-05-12 DIAGNOSIS — R3129 Other microscopic hematuria: Secondary | ICD-10-CM | POA: Diagnosis not present

## 2020-05-12 LAB — POCT I-STAT CREATININE: Creatinine, Ser: 0.8 mg/dL (ref 0.44–1.00)

## 2020-05-12 MED ORDER — IOHEXOL 300 MG/ML  SOLN
125.0000 mL | Freq: Once | INTRAMUSCULAR | Status: AC | PRN
  Administered 2020-05-12: 125 mL via INTRAVENOUS

## 2020-05-21 ENCOUNTER — Ambulatory Visit
Admission: RE | Admit: 2020-05-21 | Discharge: 2020-05-21 | Disposition: A | Discharge status: Home / Self Care | Payer: Medicare HMO | Source: Ambulatory Visit | Attending: Urology | Admitting: Urology

## 2020-05-21 ENCOUNTER — Other Ambulatory Visit
Admission: RE | Admit: 2020-05-21 | Discharge: 2020-05-21 | Disposition: A | Discharge status: Home / Self Care | Payer: Medicare HMO | Source: Home / Self Care | Attending: Urology | Admitting: Urology

## 2020-05-21 ENCOUNTER — Ambulatory Visit: Payer: Medicare HMO | Admitting: Urology

## 2020-05-21 ENCOUNTER — Other Ambulatory Visit: Payer: Self-pay

## 2020-05-21 ENCOUNTER — Encounter: Payer: Self-pay | Admitting: Urology

## 2020-05-21 VITALS — BP 148/79 | HR 80 | Ht 66.0 in | Wt 229.0 lb

## 2020-05-21 DIAGNOSIS — R3129 Other microscopic hematuria: Secondary | ICD-10-CM

## 2020-05-21 DIAGNOSIS — N200 Calculus of kidney: Secondary | ICD-10-CM

## 2020-05-21 LAB — URINALYSIS, COMPLETE
Bilirubin, UA: NEGATIVE
Glucose, UA: NEGATIVE
Ketones, UA: NEGATIVE
Leukocytes,UA: NEGATIVE
Nitrite, UA: NEGATIVE
Protein,UA: NEGATIVE
RBC, UA: NEGATIVE
Specific Gravity, UA: 1.015 (ref 1.005–1.030)
Urobilinogen, Ur: 0.2 mg/dL (ref 0.2–1.0)
pH, UA: 6.5 (ref 5.0–7.5)

## 2020-05-21 LAB — MICROSCOPIC EXAMINATION: Bacteria, UA: NONE SEEN

## 2020-05-21 NOTE — Progress Notes (Signed)
   05/21/20  CC:  Chief Complaint  Patient presents with  . Cysto    HPI: Refer to my prior note 04/22/2020.  She has no complaints today.  Blood pressure (!) 148/79, pulse 80, height 5\' 6"  (1.676 m), weight 229 lb (103.9 kg). NED. A&Ox3.   No respiratory distress   Abd soft, NT, ND Atrophic external genitalia with patent urethral meatus  Imaging: CTU of 05/12/2020 was reviewed.  There is a 7 mm calculus in a midpole left infundibulum with associated hydrocalyx.  No solid renal mass or other collecting system abnormalities.  No ureteral abnormalities noted   Cystoscopy Procedure Note  Patient identification was confirmed, informed consent was obtained, and patient was prepped using Betadine solution.  Lidocaine jelly was administered per urethral meatus.    Procedure: - Flexible cystoscope introduced, without any difficulty.   - Thorough search of the bladder revealed:    normal urethral meatus    normal urothelium    no stones    no ulcers     no tumors    no urethral polyps    no trabeculation  - Ureteral orifices were normal in position and appearance.  Post-Procedure: - Patient tolerated the procedure well  Assessment/ Plan:  Cystoscopy without mucosal abnormalities  CTU with a 7 mm left infundibular calculus with an obstructing calyx  She has no flank pain.  We discussed possibility this could be a source of recurrent infection  Treatment options were discussed including shockwave lithotripsy if stone visualized on plain film and ureteroscopic removal.  The pros and cons of each treatment were reviewed.  Will obtain a KUB and will contact with results   Abbie Sons, MD

## 2020-05-23 ENCOUNTER — Encounter: Payer: Self-pay | Admitting: Urology

## 2020-05-24 ENCOUNTER — Telehealth: Payer: Self-pay | Admitting: *Deleted

## 2020-05-24 NOTE — Telephone Encounter (Signed)
-----   Message from Abbie Sons, MD sent at 05/23/2020  7:35 PM EDT ----- Joaquim Lai is visualized on KUB however is very hard. I do not think the stone would break up well with shock wave lithotripsy and would recc scheduling ureteroscopy. Will give sched sheet to Denny Peon if she desires to sched

## 2020-05-24 NOTE — Telephone Encounter (Signed)
Notified patient as instructed, patient pleased °

## 2020-06-16 ENCOUNTER — Other Ambulatory Visit: Payer: Self-pay | Admitting: Urology

## 2020-06-16 DIAGNOSIS — N200 Calculus of kidney: Secondary | ICD-10-CM

## 2020-06-16 NOTE — Patient Instructions (Addendum)
Access Code: [email or web address] URL: [email or web address] Date: 06/17/2020 Prepared by: Roxana Hires  Exercises Supine Chin Tuck - 2 x daily - 7 x weekly - 2 sets - 10 reps - 5s hold Seated Scapular Retraction - 2 x daily - 7 x weekly - 2 sets - 10 reps - 5s hold Correct Seated Posture

## 2020-06-17 ENCOUNTER — Other Ambulatory Visit: Payer: Self-pay

## 2020-06-17 ENCOUNTER — Ambulatory Visit: Payer: Medicare HMO | Attending: Family Medicine

## 2020-06-17 DIAGNOSIS — M542 Cervicalgia: Secondary | ICD-10-CM | POA: Diagnosis not present

## 2020-06-17 NOTE — Therapy (Addendum)
[facility] [address] [place], [place], [address] Phone: [phone]   Fax:  [phone]   Physical Therapy Evaluation  Patient Details  Name: Tonya Walton MRN: [id number] Date of Birth: 1950-07-16 Referring Provider (PT): Loree Fee Meeler FNP   Encounter Date: 06/17/2020   PT End of Session - 06/18/20 1429    Visit Number 1    Number of Visits 17    Date for PT Re-Evaluation 08/12/20    Authorization Type eval: 06/17/20    PT Start Time 1145    PT Stop Time 1230    PT Time Calculation (min) 45 min    Activity Tolerance Patient tolerated treatment well    Behavior During Therapy [facility] for tasks assessed/performed           Past Medical History:  Diagnosis Date  . Anxiety   . Arthritis   . Atrial fibrillation, controlled ([place])   . Breast cancer ([place]) 2012   RT LUMPECTOMY  . CHF (congestive heart failure) ([place])   . COPD (chronic obstructive pulmonary disease) ([place])   . Coronary artery disease   . Depression   . Dysrhythmia    Afib  . GERD (gastroesophageal reflux disease)   . Headache   . Hypertension   . Hypothyroidism   . Neuromuscular disorder ([place])    nerve damage in legs  . Personal history of radiation therapy 2012   BREAST CA  . Restless leg syndrome   . Seizures ([place])   . Shortness of breath dyspnea     Past Surgical History:  Procedure Laterality Date  . APPENDECTOMY    . BREAST BIOPSY Left 07/2013   NEG  . BREAST BIOPSY Right 08/22/2016   benign  . BREAST BIOPSY Right 01/07/2019   asymmetry bx with stereo, coil marker,fat necrosis  . BREAST BIOPSY Right 01/07/2019   asymm affirm bx,  x marker,  fat necrosis  . BREAST BIOPSY Right 03/25/2020   stereo bx, ribbon clip, path pending   . BREAST EXCISIONAL BIOPSY Right 2012   POS  . BREAST LUMPECTOMY  2012   Right breast  . CARDIAC CATHETERIZATION N/A 03/02/2015   Procedure: Left Heart Cath and Coronary Angiography;  Surgeon: Teodoro Spray, MD;   Location: [facility];  Service: Cardiovascular;  Laterality: N/A;  . CATARACT EXTRACTION W/PHACO Right 10/14/2014   Procedure: CATARACT EXTRACTION PHACO AND INTRAOCULAR LENS PLACEMENT (IOC);  Surgeon: Leandrew Koyanagi, MD;  Location: [facility];  Service: Ophthalmology;  Laterality: Right;  . CORONARY ARTERY BYPASS GRAFT N/A 04/01/2015   Procedure: CORONARY ARTERY BYPASS GRAFTING (CABG) x one , using left mammary artery;  Surgeon: Ivin Poot, MD;  Location: [facility];  Service: Open Heart Surgery;  Laterality: N/A;  . JOINT REPLACEMENT Right    Total Knee Replacement  . TEE WITHOUT CARDIOVERSION N/A 04/01/2015   Procedure: TRANSESOPHAGEAL ECHOCARDIOGRAM (TEE);  Surgeon: Ivin Poot, MD;  Location: [facility];  Service: Open Heart Surgery;  Laterality: N/A;  . TONSILLECTOMY    . TOTAL KNEE ARTHROPLASTY Right 06/10/2014   Procedure: TOTAL KNEE ARTHROPLASTY;  Surgeon: Dereck Leep, MD;  Location: [facility];  Service: Orthopedics;  Laterality: Right;  . TOTAL KNEE ARTHROPLASTY Left 09/02/2014   Procedure: TOTAL KNEE ARTHROPLASTY;  Surgeon: Dereck Leep, MD;  Location: [facility];  Service: Orthopedics;  Laterality: Left;    There were no vitals filed for this visit.        [place]  PT Assessment - 06/18/20 2129      Assessment   Medical Diagnosis DDD cervical, DDD lumbar, spondylosis of lumbar region    Referring Provider (PT) Loree Fee Meeler FNP    Onset Date/Surgical Date 06/19/19    Hand Dominance Right    Next MD Visit not reported    Prior Therapy None      Precautions   Precautions None      Restrictions   Weight Bearing Restrictions No      [place] residence    Living Arrangements Spouse/significant other    Available Help at Discharge Family    Type of [place] to enter    Entrance Stairs-Number of Steps 2    Entrance Stairs-Rails None    Home Layout One level    [place] None       Prior Function   Level of Independence Independent      Cognition   Overall Cognitive Status Within Functional Limits for tasks assessed                SUBJECTIVE  Chief complaint:   Onset: Pt reports that when she was in her 70's she started to experience neck pain. Pain has been persistent since that time however for the last 6-12 months iit has progressively worsened. Pain is bilateral upper and lower neck which radiates into both shoulders. She denies any trauma or injury at onset. No surgical history to cervical spine. She also has low back pain but would like to focus on her neck pain today and address her low back pain at next visit. Cervical spine radiographs from [facility] clinic dated 04/15/2020 showed moderate to severe degenerative disc disease at C4-5, C5-6 and C6-7. Currently on meloxicam 15mg  daily and flexeril 10mg  daily.  Pain: 3/10 Present, 1/10 Best, 8/10 Worst Aggravating factors: forward neck flexion, neck rotation, lifting heavy objects,  Easing factors: heat, meloxicam, Tylenol (minimal help), laying on her back, stretching 24 hour pain behavior: varies, unsure if neck wakes her up Recent neck trauma: No Prior history of neck injury or pain: No Pain quality: aching, stiff Radiating pain: Yes, down into both shoulders  Numbness/Tingling: No Follow-up appointment with MD: Yes, 6 weeks after her initial visit Dominant hand: right Imaging: Yes, see history;    OBJECTIVE  Mental Status Patient is oriented to person, place and time.  Recent memory is intact.  Remote memory is intact.  Attention span and concentration are intact.  Expressive speech is intact.  Patient's fund of knowledge is within normal limits for educational level.  SENSATION: Grossly intact to light touch bilateral UE as determined by testing dermatomes C2-T2 Proprioception and hot/cold testing deferred on this date   MUSCULOSKELETAL: Tremor: Bilateral UE tremor, worse on the L  side Bulk: Normal Tone: Normal  Posture Significant forward head and rounded shoulders   Palpation Diffiuse tenderness to palpation bilaterally, more in upper traps   Strength R/L 5/5 Shoulder flexion (anterior deltoid/pec major/coracobrachialis, axillary n. (C5/6) and musculocutaneous n. (C5-7)) 5/5 Shoulder abduction (deltoid/supraspinatus, axillary/suprascapular n, C5) 4/4 Shoulder external rotation (infraspinatus/teres minor) (seated) 4/4 Shoulder internal rotation (subcapularis/lats/pec major) (seated) 5/5 Elbow flexion (biceps brachii, brachialis, brachioradialis, musculoskeletal n, C5/6) 5/5 Elbow extension (triceps, radial n, C7) 5/5 Wrist Extension (C6/7) 5/5 Wrist Flexion (C6/7) 5/5 Finger adduction (interossei, ulnar n, T1) Cervical isometrics are strong and painless in all directions;  AROM R/L 55* Cervical Flexion 53* Cervical Extension  35*/35* Cervical Lateral Flexion 64*/64* Cervical Rotation *Indicates pain, overpressure performed unless otherwise indicated  Repeated Movements Deferred    Passive Accessory Intervertebral Motion (PAIVM) Pt generally tender with CPA C2-C7 in sitting and supine (pt unable to comfortably lay prone). Generally hypomobile throughout but difficult to fully assess due to pain;  Reflex Testing Deferred  SPECIAL TESTS Spurlings A (ipsilateral lateral flexion/axial compression): R: Negative L: Positive Spurlings B (ipsilateral lateral flexion/contralateral rotation/axial compression): R: Negative L: Positive Distraction Test: Positive for relief  Hoffman Sign (cervical cord compression): R: Not examined L: Not examined ULTT Median: R: Not examined L: Not examined ULTT Ulnar: R: Not examined L: Not examined ULTT Radial: R: Not examined L: Not examined             Objective measurements completed on examination: See above findings.               PT Education - 06/18/20 1429    Education Details Plan of care     Person(s) Educated Patient    Methods Explanation    Comprehension Verbalized understanding            PT Short Term Goals - 06/18/20 2124      PT SHORT TERM GOAL #1   Title Pt will be independent with HEP in order to improve strength and decrease neck pain in order to improve pain-free function at home.    Time 4    Period Weeks    Status New    Target Date 07/15/20             PT Long Term Goals - 06/18/20 2124      PT LONG TERM GOAL #1   Title Pt will demonstrate decrease in NDI by at least 19% in order to demonstrate clinically significant reduction in disability related to neck injury/pain    Baseline 06/17/20: to be completed at next visit    Time 8    Period Weeks    Status New    Target Date 08/12/20      PT LONG TERM GOAL #2   Title Pt will decrease worst neck pain as reported on NPRS by at least 2 points in order to demonstrate clinically significant reduction in back pain.    Baseline 06/17/20: worst: 8/10    Time 8    Period Weeks    Status New    Target Date 08/12/20      PT LONG TERM GOAL #3   Title Pt will improve lumbar FOTO to at least 43 in order to demonstrate significant improvement in function related to low back pain    Baseline 06/17/20: 37    Time 8    Period Weeks    Status New    Target Date 08/12/20                  Plan - 06/18/20 1429    Clinical Impression Statement Pt is a pleasant 70 year-old female referred for neck and low back pain. Evaluation today focused on neck pain. Pt is generally tender to palpation bilateral upper and lower cervical spine including passive accessory mobility testing. Limited cervical extension and pain reported with AROM in all directions. Positive Spurlings on the L side. Pt presents with deficits in strength, pain, and range of motion and will benefit from skilled PT services to address deficits and return to pain-free function at home.    Personal Factors and Comorbidities Age;Comorbidity  3+;Past/Current Experience;Time since onset of  injury/illness/exacerbation    Comorbidities Afib, anxiety, depression, headaches, OA, COPD    Examination-Activity Limitations [place];Sit;Squat;Stand    Examination-Participation Restrictions Cleaning;Community Activity;Laundry;Shop    Stability/Clinical Decision Making Evolving/Moderate complexity    Clinical Decision Making Moderate    Rehab Potential Fair    PT Frequency 2x / week    PT Duration 8 weeks    PT Treatment/Interventions ADLs/Self Care Home Management;Aquatic Therapy;Biofeedback;Canalith Repostioning;Cryotherapy;Electrical Stimulation;Traction;Moist Heat;Ultrasound;DME Instruction;Iontophoresis 4mg /ml Dexamethasone;Gait training;Stair training;Therapeutic activities;Therapeutic exercise;Balance training;Neuromuscular re-education;Patient/family education;Manual techniques;Passive range of motion;Dry needling;Vestibular;Spinal Manipulations;Joint Manipulations    PT Next Visit Plan Assess low back, review cervical HEP    PT Home Exercise Plan Access Code: [email or web address]    Consulted and Agree with Plan of Care Patient           Patient will benefit from skilled therapeutic intervention in order to improve the following deficits and impairments:  Pain,Decreased strength  Visit Diagnosis: Cervicalgia     Problem List Patient Active Problem List   Diagnosis Date Noted  . Peripheral neuropathy, idiopathic 07/11/2017  . Seizure disorder ([place]) 06/12/2017  . Syncope 05/25/2017  . Medicare annual wellness visit, initial 02/24/2016  . Chest pain 04/01/2015  . CAD in native artery 03/10/2015  . Unstable angina pectoris ([place]) 02/23/2015  . Benign essential HTN 10/20/2014  . Chronic anxiety 10/20/2014  . Anxiety, generalized 10/20/2014  . Insomnia, persistent 10/20/2014  . Recurrent major depressive episodes ([place]) 10/20/2014  . Depression, major, single episode, severe ([place]) 10/20/2014  . Combined fat and carbohydrate induced  hyperlipemia 10/20/2014  . Restless leg syndrome 10/20/2014  . MDD (major depressive disorder), recurrent episode, moderate ([place]) 10/20/2014  . AF (paroxysmal atrial fibrillation) ([place]) 07/31/2014  . Paroxysmal atrial fibrillation ([place]) 07/31/2014  . H/O total knee replacement 06/25/2014  . Total knee replacement status 06/10/2014  . H/O malignant neoplasm of breast 05/24/2014  . Restless leg 05/24/2014  . H/O: HTN (hypertension) 05/24/2014  . History of atrial fibrillation 05/24/2014  . CAFL (chronic airflow limitation) ([place]) 05/24/2014  . Vitamin B12 deficiency 05/24/2014  . H/O disease 05/24/2014  . H/O: hypothyroidism 05/24/2014  . H/O gastrointestinal disease 05/24/2014  . [place] anemia 02/16/2014  . Anxiety 11/06/2013  . A-fib ([place]) 11/06/2013  . Clinical depression 11/06/2013  . Acid reflux 11/06/2013  . Heart valve disease 11/06/2013  . Primary cancer of right female breast ([place]) 11/06/2013  . HLD (hyperlipidemia) 11/06/2013  . BP (high blood pressure) 11/06/2013  . Atrial fibrillation ([place]) 11/06/2013  . Chronic obstructive pulmonary disease ([place]) 11/06/2013  . Hormone receptor positive malignant neoplasm of breast ([place]) 11/06/2013  . Adult hypothyroidism 06/29/2010  . Adult BMI 30+ 06/29/2010  . B-complex deficiency 12/05/1995  . Extremity pain 12/05/1995   Phillips Grout PT, DPT, GCS  Talajah Slimp 06/18/2020, 9:29 PM  [place] [facility] [address] [place], [place], [address] Phone: [phone]   Fax:  [phone]  Name: FRANCELLA BARNETT MRN: [id number] Date of Birth: [birth year]

## 2020-06-18 NOTE — Addendum Note (Signed)
Addended by: Roxana Hires D on: 06/18/2020 09:32 PM   Modules accepted: Orders

## 2020-06-24 ENCOUNTER — Other Ambulatory Visit: Payer: Self-pay

## 2020-06-24 ENCOUNTER — Ambulatory Visit: Payer: Medicare HMO

## 2020-06-24 DIAGNOSIS — M542 Cervicalgia: Secondary | ICD-10-CM

## 2020-06-24 DIAGNOSIS — N200 Calculus of kidney: Secondary | ICD-10-CM

## 2020-06-24 NOTE — Therapy (Signed)
[facility] [address] [place], [place], [address] Phone: [phone]   Fax:  [phone]  Physical Therapy Treatment  Patient Details  Name: Tonya Walton MRN: [id number] Date of Birth: May 23, 1950 Referring Provider (PT): Loree Fee Meeler FNP   Encounter Date: 06/24/2020   PT End of Session - 06/24/20 1145    Visit Number 2    Number of Visits 17    Date for PT Re-Evaluation 08/12/20    Authorization Type eval: 06/17/20    PT Start Time 1145    PT Stop Time 1230    PT Time Calculation (min) 45 min    Activity Tolerance Patient tolerated treatment well    Behavior During Therapy [facility] for tasks assessed/performed           Past Medical History:  Diagnosis Date  . Anxiety   . Arthritis   . Atrial fibrillation, controlled ([place])   . Breast cancer ([place]) 2012   RT LUMPECTOMY  . CHF (congestive heart failure) ([place])   . COPD (chronic obstructive pulmonary disease) ([place])   . Coronary artery disease   . Depression   . Dysrhythmia    Afib  . GERD (gastroesophageal reflux disease)   . Headache   . Hypertension   . Hypothyroidism   . Neuromuscular disorder ([place])    nerve damage in legs  . Personal history of radiation therapy 2012   BREAST CA  . Restless leg syndrome   . Seizures ([place])   . Shortness of breath dyspnea     Past Surgical History:  Procedure Laterality Date  . APPENDECTOMY    . BREAST BIOPSY Left 07/2013   NEG  . BREAST BIOPSY Right 08/22/2016   benign  . BREAST BIOPSY Right 01/07/2019   asymmetry bx with stereo, coil marker,fat necrosis  . BREAST BIOPSY Right 01/07/2019   asymm affirm bx,  x marker,  fat necrosis  . BREAST BIOPSY Right 03/25/2020   stereo bx, ribbon clip, path pending   . BREAST EXCISIONAL BIOPSY Right 2012   POS  . BREAST LUMPECTOMY  2012   Right breast  . CARDIAC CATHETERIZATION N/A 03/02/2015   Procedure: Left Heart Cath and Coronary Angiography;  Surgeon: Teodoro Spray, MD;   Location: [place] [facility];  Service: Cardiovascular;  Laterality: N/A;  . CATARACT EXTRACTION W/PHACO Right 10/14/2014   Procedure: CATARACT EXTRACTION PHACO AND INTRAOCULAR LENS PLACEMENT (IOC);  Surgeon: Leandrew Koyanagi, MD;  Location: [place];  Service: Ophthalmology;  Laterality: Right;  . CORONARY ARTERY BYPASS GRAFT N/A 04/01/2015   Procedure: CORONARY ARTERY BYPASS GRAFTING (CABG) x one , using left mammary artery;  Surgeon: Ivin Poot, MD;  Location: [place];  Service: Open Heart Surgery;  Laterality: N/A;  . JOINT REPLACEMENT Right    Total Knee Replacement  . TEE WITHOUT CARDIOVERSION N/A 04/01/2015   Procedure: TRANSESOPHAGEAL ECHOCARDIOGRAM (TEE);  Surgeon: Ivin Poot, MD;  Location: [place];  Service: Open Heart Surgery;  Laterality: N/A;  . TONSILLECTOMY    . TOTAL KNEE ARTHROPLASTY Right 06/10/2014   Procedure: TOTAL KNEE ARTHROPLASTY;  Surgeon: Dereck Leep, MD;  Location: [facility] OR[facility];  Service: Orthopedics;  Laterality: Right;  . TOTAL KNEE ARTHROPLASTY Left 09/02/2014   Procedure: TOTAL KNEE ARTHROPLASTY;  Surgeon: Dereck Leep, MD;  Location: [facility];  Service: Orthopedics;  Laterality: Left;    There were no vitals filed for this visit.   Subjective Assessment - 06/24/20 1144  Subjective Pt reports that she is doing well today. States that her neck feels good today without any resting pain. She has been performing her HEP and states that her neck pain is improving significantly.    Pertinent History Pt reports that when she was in her 68's she started to experience neck pain. Pain has been persistent since that time however for the last 6-12 months iit has progressively worsened. Pain is bilateral upper and lower neck which radiates into both shoulders. She denies any trauma or injury at onset. No surgical history to cervical spine. She also has low back pain but would like to focus on her neck pain today and address her low back pain at next visit.  Cervical spine radiographs from [facility] clinic dated 04/15/2020 showed moderate to severe degenerative disc disease at C4-5, C5-6 and C6-7. Currently on meloxicam 15mg  daily and flexeril 10mg  daily.    Diagnostic tests See history    Patient Stated Goals Decrease neck pain    Currently in Pain? No/denies                TREATMENT   Ther-ex  Repeated cervical retractions with overpressure by therapist x 10; Cervical isometrics for flexion, lateral flexion bilaterally, and rotation bilaterally 10s hold x 5 each; Seated scapular retractions with 5s hold x 10; Seated bilateral "Ws" with green tband 5s hold x 10; Standing rows with anchored green tband resistance x 10;   Manual Therapy  Light manual cervical retraction 20s hold, 10s relax x 5; Bilateral upper trap stretch 30s hold each; Bilateral levator stretch 30s hold each; Bilateral scalene (lateral flexion) stretch 30s hold each; Supine grade I-II CPA, C2-C6, 30s/bout x 2 bouts/level; STM to bilateral upper traps and suboccipitals;   Pt educated throughout session about proper posture and technique with exercises. Improved exercise technique, movement at target joints, use of target muscles after min to mod verbal, visual, tactile cues.    Pt arrives with excellent motivation to participate in physical therapy. Initiated manual therapy for her neck including stretches, cervical passive accessory mobilizations, and soft tissue mobilization. Also initiated cervical and periscapular strengthening. HEP updated with patient. Patient has upcoming surgery for her kidney stone and will call back to schedule her next follow-up visit when she has post-op restrictions from her urologist. Pt will benefit from PT services to address deficits in pain and strength in order to return to full function at home.                       PT Short Term Goals - 06/18/20 2124      PT SHORT TERM GOAL #1   Title Pt will be independent  with HEP in order to improve strength and decrease neck pain in order to improve pain-free function at home.    Time 4    Period Weeks    Status New    Target Date 07/15/20             PT Long Term Goals - 06/18/20 2124      PT LONG TERM GOAL #1   Title Pt will demonstrate decrease in NDI by at least 19% in order to demonstrate clinically significant reduction in disability related to neck injury/pain    Baseline 06/17/20: to be completed at next visit    Time 8    Period Weeks    Status New    Target Date 08/12/20      PT LONG TERM  GOAL #2   Title Pt will decrease worst neck pain as reported on NPRS by at least 2 points in order to demonstrate clinically significant reduction in back pain.    Baseline 06/17/20: worst: 8/10    Time 8    Period Weeks    Status New    Target Date 08/12/20      PT LONG TERM GOAL #3   Title Pt will improve lumbar FOTO to at least 43 in order to demonstrate significant improvement in function related to low back pain    Baseline 06/17/20: 37    Time 8    Period Weeks    Status New    Target Date 08/12/20                 Plan - 06/24/20 1145    Clinical Impression Statement Pt arrives with excellent motivation to participate in physical therapy. Initiated manual therapy for her neck including stretches, cervical passive accessory mobilizations, and soft tissue mobilization. Also initiated cervical and periscapular strengthening. HEP updated with patient. Patient has upcoming surgery for her kidney stone and will call back to schedule her next follow-up visit when she has post-op restrictions from her urologist. Pt will benefit from PT services to address deficits in pain and strength in order to return to full function at home.    Personal Factors and Comorbidities Age;Comorbidity 3+;Past/Current Experience;Time since onset of injury/illness/exacerbation    Comorbidities Afib, anxiety, depression, headaches, OA, COPD    Examination-Activity  Limitations [place];Sit;Squat;Stand    Examination-Participation Restrictions Cleaning;Community Activity;Laundry;Shop    Stability/Clinical Decision Making Evolving/Moderate complexity    Rehab Potential Fair    PT Frequency 2x / week    PT Duration 8 weeks    PT Treatment/Interventions ADLs/Self Care Home Management;Aquatic Therapy;Biofeedback;Canalith Repostioning;Cryotherapy;Electrical Stimulation;Traction;Moist Heat;Ultrasound;DME Instruction;Iontophoresis 4mg /ml Dexamethasone;Gait training;Stair training;Therapeutic activities;Therapeutic exercise;Balance training;Neuromuscular re-education;Patient/family education;Manual techniques;Passive range of motion;Dry needling;Vestibular;Spinal Manipulations;Joint Manipulations    PT Next Visit Plan Assess low back, review cervical HEP    PT Home Exercise Plan Access Code: [email or web address]    Consulted and Agree with Plan of Care Patient           Patient will benefit from skilled therapeutic intervention in order to improve the following deficits and impairments:  Pain,Decreased strength  Visit Diagnosis: Cervicalgia     Problem List Patient Active Problem List   Diagnosis Date Noted  . Peripheral neuropathy, idiopathic 07/11/2017  . Seizure disorder ([place]) 06/12/2017  . Syncope 05/25/2017  . Medicare annual wellness visit, initial 02/24/2016  . Chest pain 04/01/2015  . CAD in native artery 03/10/2015  . Unstable angina pectoris ([place]) 02/23/2015  . Benign essential HTN 10/20/2014  . Chronic anxiety 10/20/2014  . Anxiety, generalized 10/20/2014  . Insomnia, persistent 10/20/2014  . Recurrent major depressive episodes ([place]) 10/20/2014  . Depression, major, single episode, severe ([place]) 10/20/2014  . Combined fat and carbohydrate induced hyperlipemia 10/20/2014  . Restless leg syndrome 10/20/2014  . MDD (major depressive disorder), recurrent episode, moderate ([place]) 10/20/2014  . AF (paroxysmal atrial fibrillation) ([place]) 07/31/2014  .  Paroxysmal atrial fibrillation ([place]) 07/31/2014  . H/O total knee replacement 06/25/2014  . Total knee replacement status 06/10/2014  . H/O malignant neoplasm of breast 05/24/2014  . Restless leg 05/24/2014  . H/O: HTN (hypertension) 05/24/2014  . History of atrial fibrillation 05/24/2014  . CAFL (chronic airflow limitation) ([place]) 05/24/2014  . Vitamin B12 deficiency 05/24/2014  . H/O disease 05/24/2014  . H/O: hypothyroidism 05/24/2014  . H/O gastrointestinal  disease 05/24/2014  . [place]ison anemia 02/16/2014  . Anxiety 11/06/2013  . A-fib ([place]) 11/06/2013  . Clinical depression 11/06/2013  . Acid reflux 11/06/2013  . Heart valve disease 11/06/2013  . Primary cancer of right female breast ([place]) 11/06/2013  . HLD (hyperlipidemia) 11/06/2013  . BP (high blood pressure) 11/06/2013  . Atrial fibrillation ([place]) 11/06/2013  . Chronic obstructive pulmonary disease ([place]) 11/06/2013  . Hormone receptor positive malignant neoplasm of breast ([place]) 11/06/2013  . Adult hypothyroidism 06/29/2010  . Adult BMI 30+ 06/29/2010  . B-complex deficiency 12/05/1995  . Extremity pain 12/05/1995   Phillips Grout PT, DPT, GCS  Derenda Giddings 06/24/2020, 1:30 PM  [facility] [address] [place], [place], [address] Phone: [phone]   Fax:  [phone]  Name: Tonya Walton MRN: [id number] Date of Birth: [birth year]

## 2020-06-25 ENCOUNTER — Other Ambulatory Visit: Payer: Self-pay

## 2020-06-25 ENCOUNTER — Other Ambulatory Visit: Payer: Medicare HMO

## 2020-06-25 ENCOUNTER — Other Ambulatory Visit
Admission: RE | Admit: 2020-06-25 | Discharge: 2020-06-25 | Disposition: A | Discharge status: Home / Self Care | Payer: Medicare HMO | Source: Ambulatory Visit | Attending: Urology | Admitting: Urology

## 2020-06-25 DIAGNOSIS — N200 Calculus of kidney: Secondary | ICD-10-CM

## 2020-06-25 HISTORY — DX: Personal history of urinary calculi: Z87.442

## 2020-06-25 HISTORY — DX: Family history of other specified conditions: Z84.89

## 2020-06-25 HISTORY — DX: Cerebral infarction, unspecified: I63.9

## 2020-06-25 LAB — URINALYSIS, COMPLETE
Bilirubin, UA: NEGATIVE
Glucose, UA: NEGATIVE
Ketones, UA: NEGATIVE
Leukocytes,UA: NEGATIVE
Nitrite, UA: NEGATIVE
RBC, UA: NEGATIVE
Specific Gravity, UA: 1.03 (ref 1.005–1.030)
Urobilinogen, Ur: 0.2 mg/dL (ref 0.2–1.0)
pH, UA: 5 (ref 5.0–7.5)

## 2020-06-25 LAB — MICROSCOPIC EXAMINATION: Bacteria, UA: NONE SEEN

## 2020-06-25 NOTE — Patient Instructions (Addendum)
Your procedure is scheduled on: 07/06/20 - Tuesday Report to the Registration Desk on the 1st floor of the [facility]. To find out your arrival time, please call [phone] between 1PM - 3PM on: 07/05/20- Monday Report to [facility] on 06/30/20 for Labs and Instructions.  REMEMBER: Instructions that are not followed completely may result in serious medical risk, up to and including death; or upon the discretion of your surgeon and anesthesiologist your surgery may need to be rescheduled.  Do not eat food or drink any fluids after midnight the night before surgery.  No gum chewing, lozengers or hard candies.  TAKE THESE MEDICATIONS THE MORNING OF SURGERY WITH A SIPs OF WATER:  - Carboxymethylcellul-Glycerin (LUBRICATING EYE DROPS OP) - levothyroxine (SYNTHROID, LEVOTHROID) 25 MCG tablet - pantoprazole (PROTONIX) 40 MG tablet, take one the night before and one on the morning of surgery - helps to prevent nausea after surgery.  Follow recommendations from Cardiologist, Pulmonologist or PCP regarding stopping Aspirin, Coumadin, Plavix, Eliquis, Pradaxa, or Pletal. Stop taking beginning 07/02/20.  One week prior to surgery: Stop taking meloxicam (MOBIC) 15 MG tablet beginning 06/29/20. Stop Anti-inflammatories (NSAIDS) such as Advil, Aleve, Ibuprofen, Motrin, Naproxen, Naprosyn and Aspirin based products such as Excedrin, Goodys Powder, BC Powder . Stop ANY OVER THE COUNTER supplements until after surgery.  You may take Tylenol if needed for pain up until the day of surgery . No Alcohol for 24 hours before or after surgery.  No Smoking including e-cigarettes for 24 hours prior to surgery.  No chewable tobacco products for at least 6 hours prior to surgery.  No nicotine patches on the day of surgery.  Do not use any "recreational" drugs for at least a week prior to your surgery.  Please be advised that the combination of cocaine and anesthesia may have negative outcomes, up to and  including death. If you test positive for cocaine, your surgery will be cancelled.  On the morning of surgery brush your teeth with toothpaste and water, you may rinse your mouth with mouthwash if you wish. Do not swallow any toothpaste or mouthwash.  Do not wear jewelry, make-up, hairpins, clips or nail polish.  Do not wear lotions, powders, or perfumes.   Do not shave body from the neck down 48 hours prior to surgery just in case you cut yourself which could leave a site for infection.  Also, freshly shaved skin may become irritated if using the CHG soap.  Contact lenses, hearing aids and dentures may not be worn into surgery.  Do not bring valuables to the hospital. [facility] is not responsible for any missing/lost belongings or valuables.   Notify your doctor if there is any change in your medical condition (cold, fever, infection).  Wear comfortable clothing (specific to your surgery type) to the hospital.  Plan for stool softeners for home use; pain medications have a tendency to cause constipation. You can also help prevent constipation by eating foods high in fiber such as fruits and vegetables and drinking plenty of fluids as your diet allows.  After surgery, you can help prevent lung complications by doing breathing exercises.  Take deep breaths and cough every 1-2 hours. Your doctor may order a device called an Incentive Spirometer to help you take deep breaths. When coughing or sneezing, hold a pillow firmly against your incision with both hands. This is called "splinting." Doing this helps protect your incision. It also decreases belly discomfort.  If you are being admitted to the  hospital overnight, leave your suitcase in the car. After surgery it may be brought to your room.  If you are being discharged the day of surgery, you will not be allowed to drive home. You will need a responsible adult (18 years or older) to drive you home and stay with you that night.   If  you are taking public transportation, you will need to have a responsible adult (18 years or older) with you. Please confirm with your physician that it is acceptable to use public transportation.   Please call the [facility]. at [phone] if you have any questions about these instructions.  Surgery Visitation Policy:  Patients undergoing a surgery or procedure may have one family member or support person with them as long as that person is not COVID-19 positive or experiencing its symptoms.  That person may remain in the waiting area during the procedure.  Inpatient Visitation:    Visiting hours are 7 a.m. to 8 p.m. Inpatients will be allowed two visitors daily. The visitors may change each day during the patient's stay. No visitors under the age of 75. Any visitor under the age of 12 must be accompanied by an adult. The visitor must pass COVID-19 screenings, use hand sanitizer when entering and exiting the patient's room and wear a mask at all times, including in the patient's room. Patients must also wear a mask when staff or their visitor are in the room. Masking is required regardless of vaccination status.

## 2020-06-28 LAB — CULTURE, URINE COMPREHENSIVE

## 2020-06-29 ENCOUNTER — Telehealth: Payer: Self-pay | Admitting: Urology

## 2020-06-29 MED ORDER — AMOXICILLIN 875 MG PO TABS: 875.0000 mg | ORAL_TABLET | Freq: Two times a day (BID) | ORAL | 0 refills | Status: DC

## 2020-06-29 NOTE — Telephone Encounter (Signed)
Preop urine culture is growing bacteria which most likely represents colonization if she is asymptomatic.  I sent in an Rx for amoxicillin to her pharmacy for her to start on Friday, 07/02/2020.

## 2020-06-30 ENCOUNTER — Other Ambulatory Visit
Admission: RE | Admit: 2020-06-30 | Discharge: 2020-06-30 | Disposition: A | Discharge status: Home / Self Care | Payer: Medicare HMO | Source: Ambulatory Visit | Attending: Urology | Admitting: Urology

## 2020-06-30 ENCOUNTER — Encounter: Payer: Self-pay | Admitting: Urology

## 2020-06-30 ENCOUNTER — Ambulatory Visit: Payer: Medicare HMO | Admitting: Urology

## 2020-06-30 ENCOUNTER — Other Ambulatory Visit: Payer: Self-pay

## 2020-06-30 VITALS — BP 138/86 | HR 72 | Ht 66.0 in | Wt 229.0 lb

## 2020-06-30 DIAGNOSIS — N200 Calculus of kidney: Secondary | ICD-10-CM | POA: Diagnosis not present

## 2020-06-30 DIAGNOSIS — Z01812 Encounter for preprocedural laboratory examination: Secondary | ICD-10-CM | POA: Diagnosis present

## 2020-06-30 LAB — CBC
HCT: 37.3 % (ref 36.0–46.0)
Hemoglobin: 11.9 g/dL — ABNORMAL LOW (ref 12.0–15.0)
MCH: 29.6 pg (ref 26.0–34.0)
MCHC: 31.9 g/dL (ref 30.0–36.0)
MCV: 92.8 fL (ref 80.0–100.0)
Platelets: 280 10*3/uL (ref 150–400)
RBC: 4.02 MIL/uL (ref 3.87–5.11)
RDW: 14.7 % (ref 11.5–15.5)
WBC: 9.1 10*3/uL (ref 4.0–10.5)
nRBC: 0 % (ref 0.0–0.2)

## 2020-06-30 LAB — BASIC METABOLIC PANEL
Anion gap: 11 (ref 5–15)
BUN: 22 mg/dL (ref 8–23)
CO2: 23 mmol/L (ref 22–32)
Calcium: 9.3 mg/dL (ref 8.9–10.3)
Chloride: 107 mmol/L (ref 98–111)
Creatinine, Ser: 1.15 mg/dL — ABNORMAL HIGH (ref 0.44–1.00)
GFR, Estimated: 51 mL/min — ABNORMAL LOW (ref >60)
Glucose, Bld: 101 mg/dL — ABNORMAL HIGH (ref 70–99)
Potassium: 4.5 mmol/L (ref 3.5–5.1)
Sodium: 141 mmol/L (ref 135–145)

## 2020-06-30 NOTE — H&P (View-Only) (Signed)
06/30/2020  1:09 PM   Tonya Walton 07/02/1950 [id number]  Referring provider: Rusty Aus, MD [facility] [place],  [place] [address]  Chief Complaint  Patient presents with  . Other    HPI: 70 y.o. female with a history of recurrent UTIs and microhematuria.   CTU 05/12/2020 with a 7 mm midpole infundibular calculus with associated hydrocalyx  Cystoscopy unremarkable  She is scheduled for left ureteroscopy with stone removal next week   PMH: Past Medical History:  Diagnosis Date  . Anxiety   . Arthritis   . Atrial fibrillation ([place])   . Breast cancer ([place]) 2012   RT LUMPECTOMY  . CHF (congestive heart failure) ([place])   . COPD (chronic obstructive pulmonary disease) ([place])   . Coronary artery disease   . Depression   . Family history of adverse reaction to anesthesia    brothers had a lung collaspe during an unknown procedure.  [place] GERD (gastroesophageal reflux disease)   . Headache   . History of kidney stones   . Hypertension   . Hypothyroidism   . Insomnia   . Neuromuscular disorder ([place])    nerve damage in legs  . Personal history of radiation therapy 2012   BREAST CA  . Restless leg syndrome   . Seizures ([place])   . Shortness of breath dyspnea   . Stroke [facility])     Surgical History: Past Surgical History:  Procedure Laterality Date  . APPENDECTOMY    . BREAST BIOPSY Left 07/2013   NEG  . BREAST BIOPSY Right 08/22/2016   benign  . BREAST BIOPSY Right 01/07/2019   asymmetry bx with stereo, coil marker,fat necrosis  . BREAST BIOPSY Right 01/07/2019   asymm affirm bx,  x marker,  fat necrosis  . BREAST BIOPSY Right 03/25/2020   stereo bx, ribbon clip, path pending   . BREAST EXCISIONAL BIOPSY Right 2012   POS  . BREAST LUMPECTOMY  2012   Right breast  . CARDIAC CATHETERIZATION N/A 03/02/2015   Procedure: Left Heart Cath and Coronary Angiography;  Surgeon: Teodoro Spray, MD;  Location: [place] [facility]  LAB;  Service: Cardiovascular;  Laterality: N/A;  . CATARACT EXTRACTION W/PHACO Right 10/14/2014   Procedure: CATARACT EXTRACTION PHACO AND INTRAOCULAR LENS PLACEMENT (IOC);  Surgeon: Leandrew Koyanagi, MD;  Location: [facility];  Service: Ophthalmology;  Laterality: Right;  . COLONOSCOPY    . CORONARY ARTERY BYPASS GRAFT N/A 04/01/2015   Procedure: CORONARY ARTERY BYPASS GRAFTING (CABG) x one , using left mammary artery;  Surgeon: Ivin Poot, MD;  Location: [facility];  Service: Open Heart Surgery;  Laterality: N/A;  . JOINT REPLACEMENT Right    Total Knee Replacement  . TEE WITHOUT CARDIOVERSION N/A 04/01/2015   Procedure: TRANSESOPHAGEAL ECHOCARDIOGRAM (TEE);  Surgeon: Ivin Poot, MD;  Location: [facility];  Service: Open Heart Surgery;  Laterality: N/A;  . TONSILLECTOMY    . TOTAL KNEE ARTHROPLASTY Right 06/10/2014   Procedure: TOTAL KNEE ARTHROPLASTY;  Surgeon: Dereck Leep, MD;  Location: [facility];  Service: Orthopedics;  Laterality: Right;  . TOTAL KNEE ARTHROPLASTY Left 09/02/2014   Procedure: TOTAL KNEE ARTHROPLASTY;  Surgeon: Dereck Leep, MD;  Location: [facility];  Service: Orthopedics;  Laterality: Left;    Home Medications:  Allergies as of 06/30/2020   No Known Allergies     Medication List       Accurate as of June 30, 2020  1:09 PM.  If you have any questions, ask your nurse or doctor.        allopurinol 100 MG tablet Commonly known as: ZYLOPRIM Take 100 mg by mouth daily.   amoxicillin 875 MG tablet Commonly known as: AMOXIL Take 1 tablet (875 mg total) by mouth every 12 (twelve) hours. Start Friday, 07/02/2020   aspirin EC 81 MG tablet Take 81 mg by mouth.   atorvastatin 80 MG tablet Commonly known as: LIPITOR Take 80 mg by mouth daily.   B-1 500 MG Tabs Take 500 mg by mouth daily.   bismuth subsalicylate 259 DG/38VF suspension Commonly known as: PEPTO BISMOL Take 30 mLs by mouth every 6 (six) hours as needed.   clonazePAM 1 MG tablet Commonly  known as: KLONOPIN Take 1 mg by mouth at bedtime.   colchicine 0.6 MG tablet Take 0.6 mg by mouth 2 (two) times daily as needed (gout).   cyclobenzaprine 10 MG tablet Commonly known as: FLEXERIL Take 10 mg by mouth daily.   diltiazem 180 MG 24 hr capsule Commonly known as: CARDIZEM CD Take 180 mg by mouth daily.   donepezil 5 MG tablet Commonly known as: ARICEPT Take 5 mg by mouth at bedtime.   hydroxychloroquine 200 MG tablet Commonly known as: PLAQUENIL Take 200 mg by mouth 2 (two) times daily.   lamoTRIgine 25 MG tablet Commonly known as: LAMICTAL TAKE 2 TABLETS BY MOUTH EVERY DAY What changed:   how much to take  when to take this   levothyroxine 25 MCG tablet Commonly known as: SYNTHROID Take 25 mcg by mouth daily.   LUBRICATING EYE DROPS OP Place 1 drop into both eyes 2 (two) times daily.   meloxicam 15 MG tablet Commonly known as: MOBIC Take 15 mg by mouth daily.   montelukast 10 MG tablet Commonly known as: SINGULAIR Take 10 mg by mouth at bedtime.   neomycin-bacitracin-polymyxin ointment Commonly known as: NEOSPORIN Apply 1 application topically as needed for wound care.   pantoprazole 40 MG tablet Commonly known as: PROTONIX Take 40 mg by mouth daily.   sucralfate 1 g tablet Commonly known as: CARAFATE Take 1 g by mouth 4 (four) times daily as needed (heartburn).   Trospium Chloride 60 MG Cp24 Take 60 mg by mouth every morning.   vitamin B-12 1000 MCG tablet Commonly known as: CYANOCOBALAMIN Take 1,000 mcg by mouth daily.   VITAMIN B-2 PO Take 400 mg by mouth daily.   Vitamin D3 50 MCG (2000 UT) capsule Take 2,000 Units by mouth daily.   zolpidem 5 MG tablet Commonly known as: AMBIEN Take 5 mg by mouth at bedtime.       Allergies: No Known Allergies  Family History: Family History  Problem Relation Age of Onset  . Leukemia Mother   . Bone cancer Father   . Heart disease Brother   . Heart disease Brother   . Diabetes  Brother   . Hypertension Brother   . Diabetes Brother   . Hypertension Brother   . Heart disease Brother   . Breast cancer Paternal Aunt     Social History:  reports that she quit smoking about 28 years ago. Her smoking use included cigarettes. She smoked 1.00 pack per day. She has never used smokeless tobacco. She reports that she does not drink alcohol and does not use drugs.   Physical Exam: BP 138/86   Pulse 72   Ht 5\' 6"  (1.676 m)   Wt 229 lb (103.9 kg)   BMI  36.96 kg/m   Constitutional:  Alert and oriented, No acute distress. HEENT: [place] AT, moist mucus membranes.  Trachea midline, no masses. Cardiovascular: No clubbing, cyanosis, or edema.  RRR Respiratory: Normal respiratory effort, lungs clear GI: Abdomen is soft, nontender, nondistended, no abdominal masses GU: No CVA tenderness Skin: No rashes, bruises or suspicious lesions. Neurologic: Grossly intact, no focal deficits, moving all 4 extremities. Psychiatric: Normal mood and affect.   Assessment & Plan:    1.  Left nephrolithiasis  7 mm infundibular calculus with associated hydrocalyx  Stone is dense with >1000 HU  After discussing options she has elected to proceed with ureteroscopic stone removal  The procedure was discussed in detail including potential risks of bleeding, infection and injury to ureter/kidney  We discussed the small chance of being unable to treat the stone due to ureteral anatomy and inability to access the upper tracts with ureteroscope.  If this were to occur she would require stent placement and follow-up ureteroscopy after a period of stent dilation  The need for a ureteral stent postoperatively was discussed and the possibility of stent irritative symptoms.  All questions were answered and she desires to proceed  Preop urine culture did grow Enterococcus.  She is asymptomatic.  Rx amoxicillin was sent to pharmacy for her to start on 07/02/2020    Abbie Sons, MD  [place] [facility] [address] [place], [place] [address] [phone]

## 2020-06-30 NOTE — Progress Notes (Signed)
06/30/2020  1:09 PM   Tonya Walton 11-Jul-1950 [id number]  Referring provider: Rusty Aus, MD [facility] [place],  [place] [address]  Chief Complaint  Patient presents with  . Other    HPI: 70 y.o. female with a history of recurrent UTIs and microhematuria.   CTU 05/12/2020 with a 7 mm midpole infundibular calculus with associated hydrocalyx  Cystoscopy unremarkable  She is scheduled for left ureteroscopy with stone removal next week   PMH: Past Medical History:  Diagnosis Date  . Anxiety   . Arthritis   . Atrial fibrillation ([place])   . Breast cancer ([place]) 2012   RT LUMPECTOMY  . CHF (congestive heart failure) ([place])   . COPD (chronic obstructive pulmonary disease) ([place])   . Coronary artery disease   . Depression   . Family history of adverse reaction to anesthesia    brothers had a lung collaspe during an unknown procedure.  Marland Kitchen GERD (gastroesophageal reflux disease)   . Headache   . History of kidney stones   . Hypertension   . Hypothyroidism   . Insomnia   . Neuromuscular disorder ([place])    nerve damage in legs  . Personal history of radiation therapy 2012   BREAST CA  . Restless leg syndrome   . Seizures ([place])   . Shortness of breath dyspnea   . Stroke [facility])     Surgical History: Past Surgical History:  Procedure Laterality Date  . APPENDECTOMY    . BREAST BIOPSY Left 07/2013   NEG  . BREAST BIOPSY Right 08/22/2016   benign  . BREAST BIOPSY Right 01/07/2019   asymmetry bx with stereo, coil marker,fat necrosis  . BREAST BIOPSY Right 01/07/2019   asymm affirm bx,  x marker,  fat necrosis  . BREAST BIOPSY Right 03/25/2020   stereo bx, ribbon clip, path pending   . BREAST EXCISIONAL BIOPSY Right 2012   POS  . BREAST LUMPECTOMY  2012   Right breast  . CARDIAC CATHETERIZATION N/A 03/02/2015   Procedure: Left Heart Cath and Coronary Angiography;  Surgeon: Teodoro Spray, MD;  Location: [facility];  Service: Cardiovascular;  Laterality: N/A;  . CATARACT EXTRACTION W/PHACO Right 10/14/2014   Procedure: CATARACT EXTRACTION PHACO AND INTRAOCULAR LENS PLACEMENT (IOC);  Surgeon: Leandrew Koyanagi, MD;  Location: [facility];  Service: Ophthalmology;  Laterality: Right;  . COLONOSCOPY    . CORONARY ARTERY BYPASS GRAFT N/A 04/01/2015   Procedure: CORONARY ARTERY BYPASS GRAFTING (CABG) x one , using left mammary artery;  Surgeon: Ivin Poot, MD;  Location: [place];  Service: Open Heart Surgery;  Laterality: N/A;  . JOINT REPLACEMENT Right    Total Knee Replacement  . TEE WITHOUT CARDIOVERSION N/A 04/01/2015   Procedure: TRANSESOPHAGEAL ECHOCARDIOGRAM (TEE);  Surgeon: Ivin Poot, MD;  Location: [facility];  Service: Open Heart Surgery;  Laterality: N/A;  . TONSILLECTOMY    . TOTAL KNEE ARTHROPLASTY Right 06/10/2014   Procedure: TOTAL KNEE ARTHROPLASTY;  Surgeon: Dereck Leep, MD;  Location: [facility];  Service: Orthopedics;  Laterality: Right;  . TOTAL KNEE ARTHROPLASTY Left 09/02/2014   Procedure: TOTAL KNEE ARTHROPLASTY;  Surgeon: Dereck Leep, MD;  Location: [facility];  Service: Orthopedics;  Laterality: Left;    Home Medications:  Allergies as of 06/30/2020   No Known Allergies     Medication List       Accurate as of June 30, 2020  1:09 PM.  If you have any questions, ask your nurse or doctor.        allopurinol 100 MG tablet Commonly known as: ZYLOPRIM Take 100 mg by mouth daily.   amoxicillin 875 MG tablet Commonly known as: AMOXIL Take 1 tablet (875 mg total) by mouth every 12 (twelve) hours. Start Friday, 07/02/2020   aspirin EC 81 MG tablet Take 81 mg by mouth.   atorvastatin 80 MG tablet Commonly known as: LIPITOR Take 80 mg by mouth daily.   B-1 500 MG Tabs Take 500 mg by mouth daily.   bismuth subsalicylate 426 ST/41DQ suspension Commonly known as: PEPTO BISMOL Take 30 mLs by mouth every 6 (six) hours as needed.   clonazePAM 1 MG tablet Commonly  known as: KLONOPIN Take 1 mg by mouth at bedtime.   colchicine 0.6 MG tablet Take 0.6 mg by mouth 2 (two) times daily as needed (gout).   cyclobenzaprine 10 MG tablet Commonly known as: FLEXERIL Take 10 mg by mouth daily.   diltiazem 180 MG 24 hr capsule Commonly known as: CARDIZEM CD Take 180 mg by mouth daily.   donepezil 5 MG tablet Commonly known as: ARICEPT Take 5 mg by mouth at bedtime.   hydroxychloroquine 200 MG tablet Commonly known as: PLAQUENIL Take 200 mg by mouth 2 (two) times daily.   lamoTRIgine 25 MG tablet Commonly known as: LAMICTAL TAKE 2 TABLETS BY MOUTH EVERY DAY What changed:   how much to take  when to take this   levothyroxine 25 MCG tablet Commonly known as: SYNTHROID Take 25 mcg by mouth daily.   LUBRICATING EYE DROPS OP Place 1 drop into both eyes 2 (two) times daily.   meloxicam 15 MG tablet Commonly known as: MOBIC Take 15 mg by mouth daily.   montelukast 10 MG tablet Commonly known as: SINGULAIR Take 10 mg by mouth at bedtime.   neomycin-bacitracin-polymyxin ointment Commonly known as: NEOSPORIN Apply 1 application topically as needed for wound care.   pantoprazole 40 MG tablet Commonly known as: PROTONIX Take 40 mg by mouth daily.   sucralfate 1 g tablet Commonly known as: CARAFATE Take 1 g by mouth 4 (four) times daily as needed (heartburn).   Trospium Chloride 60 MG Cp24 Take 60 mg by mouth every morning.   vitamin B-12 1000 MCG tablet Commonly known as: CYANOCOBALAMIN Take 1,000 mcg by mouth daily.   VITAMIN B-2 PO Take 400 mg by mouth daily.   Vitamin D3 50 MCG (2000 UT) capsule Take 2,000 Units by mouth daily.   zolpidem 5 MG tablet Commonly known as: AMBIEN Take 5 mg by mouth at bedtime.       Allergies: No Known Allergies  Family History: Family History  Problem Relation Age of Onset  . Leukemia Mother   . Bone cancer Father   . Heart disease Brother   . Heart disease Brother   . Diabetes  Brother   . Hypertension Brother   . Diabetes Brother   . Hypertension Brother   . Heart disease Brother   . Breast cancer Paternal Aunt     Social History:  reports that she quit smoking about 28 years ago. Her smoking use included cigarettes. She smoked 1.00 pack per day. She has never used smokeless tobacco. She reports that she does not drink alcohol and does not use drugs.   Physical Exam: BP 138/86   Pulse 72   Ht 5\' 6"  (1.676 m)   Wt 229 lb (103.9 kg)   BMI  36.96 kg/m   Constitutional:  Alert and oriented, No acute distress. HEENT: [place] AT, moist mucus membranes.  Trachea midline, no masses. Cardiovascular: No clubbing, cyanosis, or edema.  RRR Respiratory: Normal respiratory effort, lungs clear GI: Abdomen is soft, nontender, nondistended, no abdominal masses GU: No CVA tenderness Skin: No rashes, bruises or suspicious lesions. Neurologic: Grossly intact, no focal deficits, moving all 4 extremities. Psychiatric: Normal mood and affect.   Assessment & Plan:    1.  Left nephrolithiasis  7 mm infundibular calculus with associated hydrocalyx  Stone is dense with >1000 HU  After discussing options she has elected to proceed with ureteroscopic stone removal  The procedure was discussed in detail including potential risks of bleeding, infection and injury to ureter/kidney  We discussed the small chance of being unable to treat the stone due to ureteral anatomy and inability to access the upper tracts with ureteroscope.  If this were to occur she would require stent placement and follow-up ureteroscopy after a period of stent dilation  The need for a ureteral stent postoperatively was discussed and the possibility of stent irritative symptoms.  All questions were answered and she desires to proceed  Preop urine culture did grow Enterococcus.  She is asymptomatic.  Rx amoxicillin was sent to pharmacy for her to start on 07/02/2020    Abbie Sons, MD  [facility] [address], [place], [place] [address] [phone]

## 2020-06-30 NOTE — H&P (View-Only) (Signed)
06/30/2020  1:09 PM   [name] 1950/05/21 [id number]  Referring provider: Rusty Aus, MD [facility] [place],  [place] [address]  Chief Complaint  Patient presents with  . Other    HPI: 70 y.o. female with a history of recurrent UTIs and microhematuria.   CTU 05/12/2020 with a 7 mm midpole infundibular calculus with associated hydrocalyx  Cystoscopy unremarkable  She is scheduled for left ureteroscopy with stone removal next week   PMH: Past Medical History:  Diagnosis Date  . Anxiety   . Arthritis   . Atrial fibrillation ([place])   . Breast cancer ([place]) 2012   RT LUMPECTOMY  . CHF (congestive heart failure) ([place])   . COPD (chronic obstructive pulmonary disease) ([place])   . Coronary artery disease   . Depression   . Family history of adverse reaction to anesthesia    brothers had a lung collaspe during an unknown procedure.  Marland Kitchen GERD (gastroesophageal reflux disease)   . Headache   . History of kidney stones   . Hypertension   . Hypothyroidism   . Insomnia   . Neuromuscular disorder ([place])    nerve damage in legs  . Personal history of radiation therapy 2012   BREAST CA  . Restless leg syndrome   . Seizures ([place])   . Shortness of breath dyspnea   . Stroke [facility])     Surgical History: Past Surgical History:  Procedure Laterality Date  . APPENDECTOMY    . BREAST BIOPSY Left 07/2013   NEG  . BREAST BIOPSY Right 08/22/2016   benign  . BREAST BIOPSY Right 01/07/2019   asymmetry bx with stereo, coil marker,fat necrosis  . BREAST BIOPSY Right 01/07/2019   asymm affirm bx,  x marker,  fat necrosis  . BREAST BIOPSY Right 03/25/2020   stereo bx, ribbon clip, path pending   . BREAST EXCISIONAL BIOPSY Right 2012   POS  . BREAST LUMPECTOMY  2012   Right breast  . CARDIAC CATHETERIZATION N/A 03/02/2015   Procedure: Left Heart Cath and Coronary Angiography;  Surgeon: Teodoro Spray, MD;  Location: [facility];  Service: Cardiovascular;  Laterality: N/A;  . CATARACT EXTRACTION W/PHACO Right 10/14/2014   Procedure: CATARACT EXTRACTION PHACO AND INTRAOCULAR LENS PLACEMENT (IOC);  Surgeon: Leandrew Koyanagi, MD;  Location: [facility];  Service: Ophthalmology;  Laterality: Right;  . COLONOSCOPY    . CORONARY ARTERY BYPASS GRAFT N/A 04/01/2015   Procedure: CORONARY ARTERY BYPASS GRAFTING (CABG) x one , using left mammary artery;  Surgeon: Ivin Poot, MD;  Location: [facility];  Service: Open Heart Surgery;  Laterality: N/A;  . JOINT REPLACEMENT Right    Total Knee Replacement  . TEE WITHOUT CARDIOVERSION N/A 04/01/2015   Procedure: TRANSESOPHAGEAL ECHOCARDIOGRAM (TEE);  Surgeon: Ivin Poot, MD;  Location: [facility];  Service: Open Heart Surgery;  Laterality: N/A;  . TONSILLECTOMY    . TOTAL KNEE ARTHROPLASTY Right 06/10/2014   Procedure: TOTAL KNEE ARTHROPLASTY;  Surgeon: Dereck Leep, MD;  Location: [facility];  Service: Orthopedics;  Laterality: Right;  . TOTAL KNEE ARTHROPLASTY Left 09/02/2014   Procedure: TOTAL KNEE ARTHROPLASTY;  Surgeon: Dereck Leep, MD;  Location: [facility];  Service: Orthopedics;  Laterality: Left;    Home Medications:  Allergies as of 06/30/2020   No Known Allergies     Medication List       Accurate as of June 30, 2020  1:09 PM.  If you have any questions, ask your nurse or doctor.        allopurinol 100 MG tablet Commonly known as: ZYLOPRIM Take 100 mg by mouth daily.   amoxicillin 875 MG tablet Commonly known as: AMOXIL Take 1 tablet (875 mg total) by mouth every 12 (twelve) hours. Start Friday, 07/02/2020   aspirin EC 81 MG tablet Take 81 mg by mouth.   atorvastatin 80 MG tablet Commonly known as: LIPITOR Take 80 mg by mouth daily.   B-1 500 MG Tabs Take 500 mg by mouth daily.   bismuth subsalicylate 469 GE/95MW suspension Commonly known as: PEPTO BISMOL Take 30 mLs by mouth every 6 (six) hours as needed.   clonazePAM 1 MG tablet Commonly  known as: KLONOPIN Take 1 mg by mouth at bedtime.   colchicine 0.6 MG tablet Take 0.6 mg by mouth 2 (two) times daily as needed (gout).   cyclobenzaprine 10 MG tablet Commonly known as: FLEXERIL Take 10 mg by mouth daily.   diltiazem 180 MG 24 hr capsule Commonly known as: CARDIZEM CD Take 180 mg by mouth daily.   donepezil 5 MG tablet Commonly known as: ARICEPT Take 5 mg by mouth at bedtime.   hydroxychloroquine 200 MG tablet Commonly known as: PLAQUENIL Take 200 mg by mouth 2 (two) times daily.   lamoTRIgine 25 MG tablet Commonly known as: LAMICTAL TAKE 2 TABLETS BY MOUTH EVERY DAY What changed:   how much to take  when to take this   levothyroxine 25 MCG tablet Commonly known as: SYNTHROID Take 25 mcg by mouth daily.   LUBRICATING EYE DROPS OP Place 1 drop into both eyes 2 (two) times daily.   meloxicam 15 MG tablet Commonly known as: MOBIC Take 15 mg by mouth daily.   montelukast 10 MG tablet Commonly known as: SINGULAIR Take 10 mg by mouth at bedtime.   neomycin-bacitracin-polymyxin ointment Commonly known as: NEOSPORIN Apply 1 application topically as needed for wound care.   pantoprazole 40 MG tablet Commonly known as: PROTONIX Take 40 mg by mouth daily.   sucralfate 1 g tablet Commonly known as: CARAFATE Take 1 g by mouth 4 (four) times daily as needed (heartburn).   Trospium Chloride 60 MG Cp24 Take 60 mg by mouth every morning.   vitamin B-12 1000 MCG tablet Commonly known as: CYANOCOBALAMIN Take 1,000 mcg by mouth daily.   VITAMIN B-2 PO Take 400 mg by mouth daily.   Vitamin D3 50 MCG (2000 UT) capsule Take 2,000 Units by mouth daily.   zolpidem 5 MG tablet Commonly known as: AMBIEN Take 5 mg by mouth at bedtime.       Allergies: No Known Allergies  Family History: Family History  Problem Relation Age of Onset  . Leukemia Mother   . Bone cancer Father   . Heart disease Brother   . Heart disease Brother   . Diabetes  Brother   . Hypertension Brother   . Diabetes Brother   . Hypertension Brother   . Heart disease Brother   . Breast cancer Paternal Aunt     Social History:  reports that she quit smoking about 28 years ago. Her smoking use included cigarettes. She smoked 1.00 pack per day. She has never used smokeless tobacco. She reports that she does not drink alcohol and does not use drugs.   Physical Exam: BP 138/86   Pulse 72   Ht 5\' 6"  (1.676 m)   Wt 229 lb (103.9 kg)   BMI  36.96 kg/m   Constitutional:  Alert and oriented, No acute distress. HEENT: [place] AT, moist mucus membranes.  Trachea midline, no masses. Cardiovascular: No clubbing, cyanosis, or edema.  RRR Respiratory: Normal respiratory effort, lungs clear GI: Abdomen is soft, nontender, nondistended, no abdominal masses GU: No CVA tenderness Skin: No rashes, bruises or suspicious lesions. Neurologic: Grossly intact, no focal deficits, moving all 4 extremities. Psychiatric: Normal mood and affect.   Assessment & Plan:    1.  Left nephrolithiasis  7 mm infundibular calculus with associated hydrocalyx  Stone is dense with >1000 HU  After discussing options she has elected to proceed with ureteroscopic stone removal  The procedure was discussed in detail including potential risks of bleeding, infection and injury to ureter/kidney  We discussed the small chance of being unable to treat the stone due to ureteral anatomy and inability to access the upper tracts with ureteroscope.  If this were to occur she would require stent placement and follow-up ureteroscopy after a period of stent dilation  The need for a ureteral stent postoperatively was discussed and the possibility of stent irritative symptoms.  All questions were answered and she desires to proceed  Preop urine culture did grow Enterococcus.  She is asymptomatic.  Rx amoxicillin was sent to pharmacy for her to start on 07/02/2020    Abbie Sons, MD  [facility] [address] [place], [place] [address] [phone]

## 2020-07-01 ENCOUNTER — Encounter: Payer: Self-pay | Admitting: Urology

## 2020-07-05 ENCOUNTER — Encounter: Payer: Self-pay | Admitting: Urology

## 2020-07-05 MED ORDER — LACTATED RINGERS IV SOLN: INTRAVENOUS | Status: DC

## 2020-07-05 MED ORDER — CEFAZOLIN SODIUM-DEXTROSE 2-4 GM/100ML-% IV SOLN
2.0000 g | INTRAVENOUS | Status: AC
  Administered 2020-07-06: 2 g via INTRAVENOUS

## 2020-07-05 MED ORDER — ORAL CARE MOUTH RINSE: 15.0000 mL | Freq: Once | OROMUCOSAL | Status: AC

## 2020-07-05 MED ORDER — CHLORHEXIDINE GLUCONATE 0.12 % MT SOLN: 15.0000 mL | Freq: Once | OROMUCOSAL | Status: AC

## 2020-07-05 NOTE — Progress Notes (Signed)
Perioperative Services  Pre-Admission/Anesthesia Testing Clinical Review  Date: 07/05/20  Patient Demographics:  Name: Tonya Walton DOB:   05-22-1950 MRN:   [id number]  Planned Surgical Procedure(s):    Case: [id number] Date/Time: 07/06/20 0715   Procedure: CYSTOSCOPY/URETEROSCOPY/HOLMIUM LASER/STENT PLACEMENT (Left )   Anesthesia type: Choice   Pre-op diagnosis: left nephrolithiasis   Location: [facility] OR ROOM 10 / [place] ORS FOR ANESTHESIA GROUP   Surgeons: Abbie Sons, MD    NOTE: Available PAT nursing documentation and vital signs have been reviewed. Clinical nursing staff has updated patient's PMH/PSHx, current medication list, and drug allergies/intolerances to ensure comprehensive history available to assist in medical decision making as it pertains to the aforementioned surgical procedure and anticipated anesthetic course. Extensive review of available clinical information performed. [facility] and PSHx updated with any diagnoses/procedures that  may have been inadvertently omitted during her intake with the pre-admission testing department's nursing staff.  Clinical Discussion:  Tonya Walton is a 70 y.o. female who is submitted for pre-surgical anesthesia review and clearance prior to her undergoing the above procedure. Patient is a Former Smoker (quit 04/1992). Pertinent PMH includes: CAD (s/p CABG), CHF, atrial fibrillation, CVA, HTN, HLD, COPD, GERD (on daily PPI), hypothyroidism, seizures, Sjogren's syndrome, OA, breast cancer, anxiety (on BZO), depression, insomnia, RLS.  Patient is followed by cardiology Ubaldo Glassing, MD).  Notes from last cardiology visit reviewed.  At the time of her clinic visit, patient doing well overall from a cardiovascular perspective.  She denied AV chest pain, PND, orthopnea, palpitations, significant peripheral edema, vertiginous symptoms, or presyncope/syncope.  Patient with mild exertional dyspnea related to her underlying COPD diagnosis.  Patient  with a history of cardiovascular diagnoses.   Patient underwent a diagnostic left heart catheterization on 03/02/2015 that revealed a 80 to 85% stenosis of the proximal LAD.  LVEF was normal.  LVEDP was 12.  Patient was referred for CVTS evaluation for possible CABG procedure.   Myocardial perfusion imaging study performed on 03/12/2015 revealed an LVEF of 70%.  There was a small perfusion abnormality in the anterior region on stress imaging consistent with possible ischemia.   Patient seen by CVTS on 03/25/2015 Prescott Gum, MD) at [place] ultimately underwent a single-vessel CABG procedure 04/01/2015.  Bypass graft was placed from the LIMA to LAD.   TTE performed on 05/22/2017 revealed low normal left ventricular systolic function with an EF of 45-55%.  There was mild valvular insufficiency noted with no evidence of valvular stenosis (see full interpretation of cardiovascular testing below).  Patient also with an atrial fibrillation diagnosis.  CHA2DS2-VASc Score = 7 (age, sex, CHF, HTN, CVA x 2, CAD).  Patient currently not chronically anticoagulated, however does take a daily low-dose ASA.  Rate controlled with prescribed diltiazem; NSR maintained. Patient on GDMT for her HTN and HLD diagnoses.  Blood pressure reasonably controlled at 140/80 on currently prescribed CCB monotherapy. Patient is on a statin for her HLD. Functional capacity, as defined by DASI, is documented as being >/= 4 METS.  No changes were made to patient's medication regimen.  Patient to follow-up with outpatient cardiology in 3 months or sooner if needed.  Patient is scheduled for an elective urological procedure on 07/06/2020 with Dr. [name].  Given patient's past medical history significant for cardiovascular diagnoses and other comorbidities, presurgical clearances were sought from her primary cardiologist and primary care provider.  Specialty clearances were obtained as follows:   Per  internal/family medicine Sabra Heck, MD), "this patient  is optimized for surgery and may proceed with the planned procedural course at al overall LOW risk for developing complications".   Per cardiology, "this patient is optimized for surgery and may proceed with the planned procedural course with a LOW risk stratification". This patient is on daily antiplatelet therapy. She has been instructed on recommendations for holding her daily low-dose ASA for 5 days prior to her procedure with plans to restart as soon as postoperative bleeding risk felt to be minimized by her attending surgeon. The patient has been instructed that her last dose of her anticoagulant will be on 07/01/2020.  Patient denies previous perioperative complications with anesthesia in the past. In review of the available records, it is noted that patient underwent a general anesthetic course at [facility] (ASA IV) in 03/2015 without documented complications.   Vitals with BMI 06/30/2020 05/21/2020 04/22/2020  Height _0  _1  _2   Weight 229 lbs 229 lbs 229 lbs  BMI 36.98 40.08 67.61  Systolic 950 932 671  Diastolic 86 79 80  Pulse 72 80 81  Some encounter information is confidential and restricted. Go to Review Flowsheets activity to see all data.    Providers/Specialists:   NOTE: Primary physician provider listed below. Patient may have been seen by APP or partner within same practice.   PROVIDER ROLE / SPECIALTY LAST Claud Kelp, MD  Urology (Surgeon)  06/30/2020  Rusty Aus, MD  Primary Care Provider  06/09/2020  Bartholome Bill, MD  Cardiology  08/25/2015   Allergies:  Patient has no known allergies.  Current Home Medications:   No current facility-administered medications for this encounter.   Marland Kitchen allopurinol (ZYLOPRIM) 100 MG tablet  . aspirin EC 81 MG tablet  . atorvastatin (LIPITOR) 80 MG tablet  . bismuth subsalicylate (PEPTO BISMOL) 262 MG/15ML suspension  . Carboxymethylcellul-Glycerin  (LUBRICATING EYE DROPS OP)  . Cholecalciferol (VITAMIN D3) 2000 units capsule  . clonazePAM (KLONOPIN) 1 MG tablet  . colchicine 0.6 MG tablet  . cyclobenzaprine (FLEXERIL) 10 MG tablet  . diltiazem (CARDIZEM CD) 180 MG 24 hr capsule  . donepezil (ARICEPT) 5 MG tablet  . hydroxychloroquine (PLAQUENIL) 200 MG tablet  . lamoTRIgine (LAMICTAL) 25 MG tablet  . levothyroxine (SYNTHROID, LEVOTHROID) 25 MCG tablet  . meloxicam (MOBIC) 15 MG tablet  . montelukast (SINGULAIR) 10 MG tablet  . neomycin-bacitracin-polymyxin (NEOSPORIN) ointment  . pantoprazole (PROTONIX) 40 MG tablet  . Riboflavin (VITAMIN B-2 PO)  . sucralfate (CARAFATE) 1 g tablet  . Thiamine HCl (B-1) 500 MG TABS  . Trospium Chloride 60 MG CP24  . vitamin B-12 (CYANOCOBALAMIN) 1000 MCG tablet  . zolpidem (AMBIEN) 5 MG tablet  . amoxicillin (AMOXIL) 875 MG tablet   History:   Past Medical History:  Diagnosis Date  . Anxiety   . Arthritis   . Atrial fibrillation ([place])   . CHF (congestive heart failure) ([place])   . COPD (chronic obstructive pulmonary disease) ([place])   . Coronary artery disease   . Depression   . Family history of adverse reaction to anesthesia    brothers had a lung collaspe during an unknown procedure.  Marland Kitchen GERD (gastroesophageal reflux disease)   . Headache   . History of kidney stones   . Hx of CABG 04/01/2015   Single vessel; LIMA-LAD  . Hypertension   . Hypothyroidism   . Insomnia   . Invasive ductal carcinoma of breast, female, right ([place]) 09/27/2010   Stage 1A; ER/PR (+) and HER2/neu  indeterminant; pT1b pN0 (sn)  . Neuromuscular disorder ([place])    nerve damage in legs  . Personal history of radiation therapy 2012   BREAST CA  . Restless leg syndrome   . Seizures ([place])   . Shortness of breath dyspnea   . Stroke [facility])    Past Surgical History:  Procedure Laterality Date  . APPENDECTOMY    . BREAST BIOPSY Left 07/2013   NEG  . BREAST BIOPSY Right 08/22/2016   benign  . BREAST BIOPSY  Right 01/07/2019   asymmetry bx with stereo, coil marker,fat necrosis  . BREAST BIOPSY Right 01/07/2019   asymm affirm bx,  x marker,  fat necrosis  . BREAST BIOPSY Right 03/25/2020   stereo bx, ribbon clip, path pending   . BREAST EXCISIONAL BIOPSY Right 2012   POS  . BREAST LUMPECTOMY  2012   Right breast  . CARDIAC CATHETERIZATION N/A 03/02/2015   Procedure: Left Heart Cath and Coronary Angiography;  Surgeon: Teodoro Spray, MD;  Location: [place] CV LAB;  Service: Cardiovascular;  Laterality: N/A;  . CATARACT EXTRACTION W/PHACO Right 10/14/2014   Procedure: CATARACT EXTRACTION PHACO AND INTRAOCULAR LENS PLACEMENT (IOC);  Surgeon: Leandrew Koyanagi, MD;  Location: [place];  Service: Ophthalmology;  Laterality: Right;  . COLONOSCOPY    . CORONARY ARTERY BYPASS GRAFT N/A 04/01/2015   Procedure: CORONARY ARTERY BYPASS GRAFTING (CABG) x one , using left mammary artery;  Surgeon: Ivin Poot, MD;  Location: [place];  Service: Open Heart Surgery;  Laterality: N/A;  . JOINT REPLACEMENT Right    Total Knee Replacement  . TEE WITHOUT CARDIOVERSION N/A 04/01/2015   Procedure: TRANSESOPHAGEAL ECHOCARDIOGRAM (TEE);  Surgeon: Ivin Poot, MD;  Location: [place];  Service: Open Heart Surgery;  Laterality: N/A;  . TONSILLECTOMY    . TOTAL KNEE ARTHROPLASTY Right 06/10/2014   Procedure: TOTAL KNEE ARTHROPLASTY;  Surgeon: Dereck Leep, MD;  Location: [facility] ORS;  Service: Orthopedics;  Laterality: Right;  . TOTAL KNEE ARTHROPLASTY Left 09/02/2014   Procedure: TOTAL KNEE ARTHROPLASTY;  Surgeon: Dereck Leep, MD;  Location: [facility] ORS;  Service: Orthopedics;  Laterality: Left;   Family History  Problem Relation Age of Onset  . Leukemia Mother   . Bone cancer Father   . Heart disease Brother   . Heart disease Brother   . Diabetes Brother   . Hypertension Brother   . Diabetes Brother   . Hypertension Brother   . Heart disease Brother   . Breast cancer Paternal Aunt    Social  History   Tobacco Use  . Smoking status: Former Smoker    Packs/day: 1.00    Types: Cigarettes    Quit date: 04/30/1992    Years since quitting: 28.2  . Smokeless tobacco: Never Used  Vaping Use  . Vaping Use: Never used  Substance Use Topics  . Alcohol use: No  . Drug use: No    Pertinent Clinical Results:  LABS: Labs reviewed: Acceptable for surgery.  No visits with results within 3 Day(s) from this visit.  Latest known visit with results is:  Hospital Outpatient Visit on 06/30/2020  Component Date Value Ref Range Status  . WBC 06/30/2020 9.1  4.0 - 10.5 K/uL Final  . RBC 06/30/2020 4.02  3.87 - 5.11 MIL/uL Final  . Hemoglobin 06/30/2020 11.9* 12.0 - 15.0 g/dL Final  . HCT 06/30/2020 37.3  36.0 - 46.0 % Final  . MCV 06/30/2020 92.8  80.0 - 100.0 fL Final  .  MCH 06/30/2020 29.6  26.0 - 34.0 pg Final  . MCHC 06/30/2020 31.9  30.0 - 36.0 g/dL Final  . RDW 06/30/2020 14.7  11.5 - 15.5 % Final  . Platelets 06/30/2020 280  150 - 400 K/uL Final  . nRBC 06/30/2020 0.0  0.0 - 0.2 % Final   Performed at [facility], [address], [place], [place] [address]  . Sodium 06/30/2020 141  135 - 145 mmol/L Final  . Potassium 06/30/2020 4.5  3.5 - 5.1 mmol/L Final  . Chloride 06/30/2020 107  98 - 111 mmol/L Final  . CO2 06/30/2020 23  22 - 32 mmol/L Final  . Glucose, Bld 06/30/2020 101* 70 - 99 mg/dL Final   Glucose reference range applies only to samples taken after fasting for at least 8 hours.  . BUN 06/30/2020 22  8 - 23 mg/dL Final  . Creatinine, Ser 06/30/2020 1.15* 0.44 - 1.00 mg/dL Final  . Calcium 06/30/2020 9.3  8.9 - 10.3 mg/dL Final  . GFR, Estimated 06/30/2020 51* >60 mL/min Final   Comment: (NOTE) Calculated using the CKD-EPI Creatinine Equation (2021)   . Anion gap 06/30/2020 11  5 - 15 Final   Performed at [facility], [address] [place], [place] [address]    ECG: Date: 04/08/2020 Time ECG obtained: 0943 AM Rate: 80 bpm Rhythm: normal  sinus Axis (leads I and aVF): Normal Intervals: PR 142 ms. QRS 68 ms. QTc 435 ms. ST segment and T wave changes: Nonspecific ST and T wave abnormality  Comparison: Similar to previous tracing obtained on 11/27/2017   IMAGING / PROCEDURES: TRANSTHORACIC ECHOCARDIOGRAM performed on 05/22/2017 1. LVEF 45-55% 2. Normal left ventricular systolic function 3. Normal right ventricular systolic function 4. Mild TR 5. Trivial AR MR and PR 6. No valvular stenosis 7. No evidence of pericardial effusion  TRANSESOPHAGEAL ECHOCARDIOGRAM performed on 04/01/2015 1. LVEF 50-55% 2. Left ventricular systolic function normal 3. No evidence of atrial, mitral, or tricuspid valve vegetation 4. No LAA thrombus visualized 5. No ASD or PFO was identified  CORONARY ARTERY BYPASS GRAFTING performed on 04/01/2015 1. Single-vessel CABG procedure  Bypass graft placed to the LIMA-LAD  LEXISCAN performed on 03/12/2015 1. LVEF 70% 2. Regional wall motion reveals normal myocardial thickening and wall motion 3. There were no artifacts noted 4. Left ventricular cavity size normal 5. Small perfusion abnormality of mild intensity present in the anterior region on stress images suggesting ischemia 6. There was noted redistribution with rest 7. The overall quality of the study is good  LEFT HEART CATHETERIZATION AND CORONARY ANGIOGRAPHY performed on 03/02/2015 1. Left ventricular ejection fraction normal 2. Ostial LAD lesion with 80-85% stenosis 3. Recommendation: CVTS evaluation for consideration of single-vessel CABG procedure   Impression and Plan:  Tonya Walton has been referred for pre-anesthesia review and clearance prior to her undergoing the planned anesthetic and procedural courses. Available labs, pertinent testing, and imaging results were personally reviewed by me. This patient has been appropriately cleared by cardiology (low) and by internal/family medicine (low) with the individually indicated  risks of significant perioperative complications.  Based on clinical review performed today (07/05/20), barring any significant acute changes in the patient's overall condition, it is anticipated that she will be able to proceed with the planned surgical intervention. Any acute changes in clinical condition may necessitate her procedure being postponed and/or cancelled. Patient will meet with anesthesia team (MD and/or CRNA) on the day of her procedure for preoperative evaluation/assessment. Questions regarding  anesthetic course will be fielded at that time.   Pre-surgical instructions were reviewed with the patient during her PAT appointment and questions were fielded by PAT clinical staff. Patient was advised that if any questions or concerns arise prior to her procedure then she should return a call to PAT and/or her surgeon's office to discuss.  Honor Loh, MSN, APRN, FNP-C, CEN [facility]  Peri-operative Services Nurse Practitioner Phone: [phone] 07/05/20 11:37 AM  NOTE: This note has been prepared using Dragon dictation software. Despite my best ability to proofread, there is always the potential that unintentional transcriptional errors may still occur from this process.

## 2020-07-06 ENCOUNTER — Encounter
Admission: RE | Disposition: A | Discharge status: Home / Self Care | Payer: Self-pay | Source: Home / Self Care | Attending: Urology

## 2020-07-06 ENCOUNTER — Ambulatory Visit
Admission: RE | Admit: 2020-07-06 | Discharge: 2020-07-06 | Disposition: A | Discharge status: Home / Self Care | Payer: Medicare HMO | Attending: Urology | Admitting: Urology

## 2020-07-06 ENCOUNTER — Ambulatory Visit: Payer: Medicare HMO | Admitting: Urgent Care

## 2020-07-06 ENCOUNTER — Ambulatory Visit: Payer: Medicare HMO

## 2020-07-06 ENCOUNTER — Other Ambulatory Visit: Payer: Self-pay

## 2020-07-06 ENCOUNTER — Encounter: Payer: Self-pay | Admitting: Urology

## 2020-07-06 DIAGNOSIS — N200 Calculus of kidney: Secondary | ICD-10-CM

## 2020-07-06 DIAGNOSIS — Z87440 Personal history of urinary (tract) infections: Secondary | ICD-10-CM | POA: Diagnosis not present

## 2020-07-06 DIAGNOSIS — N1330 Unspecified hydronephrosis: Secondary | ICD-10-CM

## 2020-07-06 HISTORY — DX: Sjogren syndrome, unspecified: M35.00

## 2020-07-06 HISTORY — DX: Insomnia, unspecified: G47.00

## 2020-07-06 HISTORY — DX: Unspecified atrial fibrillation: I48.91

## 2020-07-06 HISTORY — PX: CYSTOSCOPY/URETEROSCOPY/HOLMIUM LASER/STENT PLACEMENT: SHX6546

## 2020-07-06 SURGERY — CYSTOSCOPY/URETEROSCOPY/HOLMIUM LASER/STENT PLACEMENT
Anesthesia: General | Laterality: Left

## 2020-07-06 MED ORDER — CEFAZOLIN SODIUM-DEXTROSE 2-4 GM/100ML-% IV SOLN
INTRAVENOUS | Status: AC
  Filled 2020-07-06: qty 100

## 2020-07-06 MED ORDER — ONDANSETRON HCL 4 MG/2ML IJ SOLN: 4.0000 mg | Freq: Once | INTRAMUSCULAR | Status: DC | PRN

## 2020-07-06 MED ORDER — ROCURONIUM BROMIDE 100 MG/10ML IV SOLN
INTRAVENOUS | Status: DC | PRN
  Administered 2020-07-06: 50 mg via INTRAVENOUS
  Administered 2020-07-06: 20 mg via INTRAVENOUS

## 2020-07-06 MED ORDER — LACTATED RINGERS IV SOLN: INTRAVENOUS | Status: DC | PRN

## 2020-07-06 MED ORDER — KETOROLAC TROMETHAMINE 30 MG/ML IJ SOLN
INTRAMUSCULAR | Status: DC | PRN
  Administered 2020-07-06: 30 mg via INTRAVENOUS

## 2020-07-06 MED ORDER — MIDAZOLAM HCL 2 MG/2ML IJ SOLN
INTRAMUSCULAR | Status: AC
  Filled 2020-07-06: qty 2

## 2020-07-06 MED ORDER — FENTANYL CITRATE (PF) 100 MCG/2ML IJ SOLN
INTRAMUSCULAR | Status: AC
  Filled 2020-07-06: qty 2

## 2020-07-06 MED ORDER — ONDANSETRON HCL 4 MG/2ML IJ SOLN
INTRAMUSCULAR | Status: DC | PRN
  Administered 2020-07-06: 4 mg via INTRAVENOUS

## 2020-07-06 MED ORDER — HYDROCODONE-ACETAMINOPHEN 5-325 MG PO TABS: 1.0000 | ORAL_TABLET | Freq: Four times a day (QID) | ORAL | 0 refills | Status: DC | PRN

## 2020-07-06 MED ORDER — IOHEXOL 180 MG/ML  SOLN
INTRAMUSCULAR | Status: DC | PRN
  Administered 2020-07-06 (×2): 5 mL

## 2020-07-06 MED ORDER — PROPOFOL 10 MG/ML IV BOLUS
INTRAVENOUS | Status: AC
  Filled 2020-07-06: qty 20

## 2020-07-06 MED ORDER — DEXAMETHASONE SODIUM PHOSPHATE 10 MG/ML IJ SOLN
INTRAMUSCULAR | Status: DC | PRN
  Administered 2020-07-06: 10 mg via INTRAVENOUS

## 2020-07-06 MED ORDER — FENTANYL CITRATE (PF) 100 MCG/2ML IJ SOLN: 25.0000 ug | INTRAMUSCULAR | Status: DC | PRN

## 2020-07-06 MED ORDER — FENTANYL CITRATE (PF) 100 MCG/2ML IJ SOLN
INTRAMUSCULAR | Status: DC | PRN
  Administered 2020-07-06 (×2): 50 ug via INTRAVENOUS

## 2020-07-06 MED ORDER — CHLORHEXIDINE GLUCONATE 0.12 % MT SOLN
OROMUCOSAL | Status: AC
  Administered 2020-07-06: 15 mL via OROMUCOSAL
  Filled 2020-07-06: qty 15

## 2020-07-06 MED ORDER — DEXAMETHASONE SODIUM PHOSPHATE 10 MG/ML IJ SOLN
INTRAMUSCULAR | Status: AC
  Filled 2020-07-06: qty 1

## 2020-07-06 MED ORDER — TAMSULOSIN HCL 0.4 MG PO CAPS: 0.4000 mg | ORAL_CAPSULE | Freq: Every day | ORAL | 0 refills | Status: DC

## 2020-07-06 MED ORDER — SUGAMMADEX SODIUM 200 MG/2ML IV SOLN
INTRAVENOUS | Status: DC | PRN
  Administered 2020-07-06: 200 mg via INTRAVENOUS

## 2020-07-06 MED ORDER — PROPOFOL 10 MG/ML IV BOLUS
INTRAVENOUS | Status: DC | PRN
  Administered 2020-07-06: 100 mg via INTRAVENOUS

## 2020-07-06 MED ORDER — LIDOCAINE HCL (PF) 2 % IJ SOLN
INTRAMUSCULAR | Status: AC
  Filled 2020-07-06: qty 5

## 2020-07-06 MED ORDER — ONDANSETRON HCL 4 MG/2ML IJ SOLN
INTRAMUSCULAR | Status: AC
  Filled 2020-07-06: qty 2

## 2020-07-06 SURGICAL SUPPLY — 30 items
BAG DRAIN CYSTO-URO LG1000N (MISCELLANEOUS) ×2 IMPLANT
BASKET ZERO TIP 1.9FR (BASKET) IMPLANT
BRUSH SCRUB EZ 1% IODOPHOR (MISCELLANEOUS) ×2 IMPLANT
BSKT STON RTRVL ZERO TP 1.9FR (BASKET)
CATH URET FLEX-TIP 2 LUMEN 10F (CATHETERS) ×2 IMPLANT
CATH URETL 5X70 OPEN END (CATHETERS) IMPLANT
CNTNR SPEC 2.5X3XGRAD LEK (MISCELLANEOUS)
CONT SPEC 4OZ STER OR WHT (MISCELLANEOUS)
CONT SPEC 4OZ STRL OR WHT (MISCELLANEOUS)
CONTAINER SPEC 2.5X3XGRAD LEK (MISCELLANEOUS) IMPLANT
DRAPE UTILITY 15X26 TOWEL STRL (DRAPES) ×2 IMPLANT
GLOVE SURG UNDER POLY LF SZ7.5 (GLOVE) ×2 IMPLANT
GOWN STRL REUS W/ TWL LRG LVL3 (GOWN DISPOSABLE) ×1 IMPLANT
GOWN STRL REUS W/ TWL XL LVL3 (GOWN DISPOSABLE) ×1 IMPLANT
GOWN STRL REUS W/TWL LRG LVL3 (GOWN DISPOSABLE) ×2
GOWN STRL REUS W/TWL XL LVL3 (GOWN DISPOSABLE) ×2
GUIDEWIRE GREEN .038 145CM (MISCELLANEOUS) ×2 IMPLANT
GUIDEWIRE STR DUAL SENSOR (WIRE) ×4 IMPLANT
INFUSOR MANOMETER BAG 3000ML (MISCELLANEOUS) ×2 IMPLANT
IV NS IRRIG 3000ML ARTHROMATIC (IV SOLUTION) ×2 IMPLANT
KIT TURNOVER CYSTO (KITS) ×2 IMPLANT
PACK CYSTO AR (MISCELLANEOUS) ×2 IMPLANT
SET CYSTO W/LG BORE CLAMP LF (SET/KITS/TRAYS/PACK) ×2 IMPLANT
SHEATH URETERAL 12FRX35CM (MISCELLANEOUS) IMPLANT
STENT URET 6FRX24 CONTOUR (STENTS) ×2 IMPLANT
STENT URET 6FRX26 CONTOUR (STENTS) IMPLANT
SURGILUBE 2OZ TUBE FLIPTOP (MISCELLANEOUS) ×2 IMPLANT
TRACTIP FLEXIVA PULSE ID 200 (Laser) ×2 IMPLANT
VALVE UROSEAL ADJ ENDO (VALVE) ×2 IMPLANT
WATER STERILE IRR 1000ML POUR (IV SOLUTION) ×2 IMPLANT

## 2020-07-06 NOTE — Anesthesia Procedure Notes (Signed)
Procedure Name: Intubation Date/Time: 07/06/2020 7:40 AM Performed by: Philbert Riser, CRNA Pre-anesthesia Checklist: Patient identified, Emergency Drugs available, Suction available and Patient being monitored Patient Re-evaluated:Patient Re-evaluated prior to induction Oxygen Delivery Method: Circle system utilized Preoxygenation: Pre-oxygenation with 100% oxygen Induction Type: IV induction Ventilation: Mask ventilation without difficulty Laryngoscope Size: McGraph and 3 Grade View: Grade II Tube type: Oral Tube size: 7.0 mm Number of attempts: 1 Airway Equipment and Method: Stylet and Oral airway Placement Confirmation: ETT inserted through vocal cords under direct vision,  positive ETCO2 and breath sounds checked- equal and bilateral Secured at: 22 cm Tube secured with: Tape Dental Injury: Teeth and Oropharynx as per pre-operative assessment

## 2020-07-06 NOTE — Transfer of Care (Signed)
Immediate Anesthesia Transfer of Care Note  Patient: Tonya Walton  Procedure(s) Performed: CYSTOSCOPY/URETEROSCOPY/STENT PLACEMENT/ retrograde pyelogram (Left )  Patient Location: PACU  Anesthesia Type:General  Level of Consciousness: awake  Airway & Oxygen Therapy: Patient Spontanous Breathing and Patient connected to face mask oxygen  Post-op Assessment: Report given to RN and Post -op Vital signs reviewed and stable  Post vital signs: Reviewed and stable  Last Vitals:  Vitals Value Taken Time  BP 158/60 07/06/20 0833  Temp    Pulse 75 07/06/20 0835  Resp 19 07/06/20 0835  SpO2 100 % 07/06/20 0835  Vitals shown include unvalidated device data.  Last Pain:  Vitals:   07/06/20 0703  TempSrc: Temporal  PainSc: 3          Complications: No complications documented.

## 2020-07-06 NOTE — Interval H&P Note (Signed)
History and Physical Interval Note:  07/06/2020 7:13 AM  Tonya Walton  has presented today for surgery, with the diagnosis of left nephrolithiasis.  The various methods of treatment have been discussed with the patient and family. After consideration of risks, benefits and other options for treatment, the patient has consented to  Procedure(s): CYSTOSCOPY/URETEROSCOPY/HOLMIUM LASER/STENT PLACEMENT (Left) as a surgical intervention.  The patient's history has been reviewed, patient examined, no change in status, stable for surgery.  I have reviewed the patient's chart and labs.  Questions were answered to the patient's satisfaction.     [place]

## 2020-07-06 NOTE — Anesthesia Preprocedure Evaluation (Signed)
Anesthesia Evaluation  Patient identified by MRN, date of birth, ID band Patient awake    Reviewed: Allergy & Precautions, H&P , NPO status , Patient's Chart, lab work & pertinent test results, reviewed documented beta blocker date and time   Airway Mallampati: III  TM Distance: >3 FB Neck ROM: full    Dental  (+) Upper Dentures, Poor Dentition   Pulmonary shortness of breath and with exertion, COPD, former smoker,    Pulmonary exam normal        Cardiovascular Exercise Tolerance: Poor hypertension, On Medications + angina with exertion + CAD and +CHF  (-) Orthopnea and (-) PND Atrial Fibrillation  Rhythm:regular Rate:Normal     Neuro/Psych  Headaches, Seizures -, Well Controlled,  PSYCHIATRIC DISORDERS Anxiety Depression  Neuromuscular disease CVA, Residual Symptoms    GI/Hepatic Neg liver ROS, GERD  Medicated,  Endo/Other  Hypothyroidism   Renal/GU negative Renal ROS  negative genitourinary   Musculoskeletal   Abdominal   Peds  Hematology  (+) Blood dyscrasia, anemia ,   Anesthesia Other Findings Past Medical History: No date: Anxiety No date: Arthritis No date: Atrial fibrillation (HCC) No date: CHF (congestive heart failure) (HCC) No date: COPD (chronic obstructive pulmonary disease) (HCC) No date: Coronary artery disease No date: Depression No date: Family history of adverse reaction to anesthesia     Comment:  brothers had a lung collaspe during an unknown               procedure. No date: GERD (gastroesophageal reflux disease) No date: Headache No date: History of kidney stones 04/01/2015: Hx of CABG     Comment:  Single vessel; LIMA-LAD No date: Hypertension No date: Hypothyroidism No date: Insomnia 09/27/2010: Invasive ductal carcinoma of breast, female, right ([place])     Comment:  Stage 1A; ER/PR (+) and HER2/neu indeterminant; pT1b pN0              (sn) No date: Neuromuscular disorder ([place])      Comment:  nerve damage in legs 2012: Personal history of radiation therapy     Comment:  BREAST CA No date: Restless leg syndrome No date: Seizures ([place]) No date: Shortness of breath dyspnea No date: Sjogren's syndrome (HCC) No date: Stroke [facility])     Comment:  Some mild weakness in left side residual Past Surgical History: No date: APPENDECTOMY 07/2013: BREAST BIOPSY; Left     Comment:  NEG 08/22/2016: BREAST BIOPSY; Right     Comment:  benign 01/07/2019: BREAST BIOPSY; Right     Comment:  asymmetry bx with stereo, coil marker,fat necrosis 01/07/2019: BREAST BIOPSY; Right     Comment:  asymm affirm bx,  x marker,  fat necrosis 03/25/2020: BREAST BIOPSY; Right     Comment:  stereo bx, ribbon clip, path pending  2012: BREAST EXCISIONAL BIOPSY; Right     Comment:  POS 2012: BREAST LUMPECTOMY     Comment:  Right breast 03/02/2015: CARDIAC CATHETERIZATION; N/A     Comment:  Procedure: Left Heart Cath and Coronary Angiography;                Surgeon: Teodoro Spray, MD;  Location: [place] CV               LAB;  Service: Cardiovascular;  Laterality: N/A; 10/14/2014: CATARACT EXTRACTION W/PHACO; Right     Comment:  Procedure: CATARACT EXTRACTION PHACO AND INTRAOCULAR               LENS PLACEMENT ([place]);  Surgeon: Leandrew Koyanagi, MD;               Location: [place];  Service: Ophthalmology;                Laterality: Right; No date: COLONOSCOPY 04/01/2015: CORONARY ARTERY BYPASS GRAFT; N/A     Comment:  Procedure: CORONARY ARTERY BYPASS GRAFTING (CABG) x one               , using left mammary artery;  Surgeon: Ivin Poot,               MD;  Location: [place];  Service: Open Heart Surgery;                Laterality: N/A; No date: JOINT REPLACEMENT; Right     Comment:  Total Knee Replacement 04/01/2015: TEE WITHOUT CARDIOVERSION; N/A     Comment:  Procedure: TRANSESOPHAGEAL ECHOCARDIOGRAM (TEE);                Surgeon: Ivin Poot, MD;  Location: [place];   Service:              Open Heart Surgery;  Laterality: N/A; No date: TONSILLECTOMY 06/10/2014: TOTAL KNEE ARTHROPLASTY; Right     Comment:  Procedure: TOTAL KNEE ARTHROPLASTY;  Surgeon: Dereck Leep, MD;  Location: [facility] ORS;  Service: Orthopedics;                Laterality: Right; 09/02/2014: TOTAL KNEE ARTHROPLASTY; Left     Comment:  Procedure: TOTAL KNEE ARTHROPLASTY;  Surgeon: Dereck Leep, MD;  Location: [facility] ORS;  Service: Orthopedics;                Laterality: Left; BMI    Body Mass Index: 36.97 kg/m     Reproductive/Obstetrics negative OB ROS                             Anesthesia Physical Anesthesia Plan  ASA: III  Anesthesia Plan: General ETT   Post-op Pain Management:    Induction:   PONV Risk Score and Plan: 4 or greater  Airway Management Planned:   Additional Equipment:   Intra-op Plan:   Post-operative Plan:   Informed Consent: I have reviewed the patients History and Physical, chart, labs and discussed the procedure including the risks, benefits and alternatives for the proposed anesthesia with the patient or authorized representative who has indicated his/her understanding and acceptance.     Dental Advisory Given  Plan Discussed with: CRNA  Anesthesia Plan Comments:         Anesthesia Quick Evaluation

## 2020-07-06 NOTE — Discharge Instructions (Signed)
DISCHARGE INSTRUCTIONS FOR KIDNEY STONE/URETERAL STENT   MEDICATIONS:  1. Resume all your other meds from home.  2.  AZO (over-the-counter) can help with the burning/stinging when you urinate. 3.  Hydrocodone is for moderate/severe pain, Rx was sent to your pharmacy. 4.  Rx tamsulosin was sent to pharmacy for stent irritation  ACTIVITY:  1. May resume regular activities in 24 hours. 2. No driving while on narcotic pain medications  3. Drink plenty of water  4. Continue to walk at home - you can still get blood clots when you are at home, so keep active, but don't over do it.  5. May return to work/school tomorrow or when you feel ready     SIGNS/SYMPTOMS TO CALL:  Common postoperative symptoms include urinary frequency, urgency, bladder spasm and blood in the urine  Please call us if you have a fever greater than 101.5, uncontrolled nausea/vomiting, uncontrolled pain, dizziness, unable to urinate, excessively bloody urine, chest pain, shortness of breath, leg swelling, leg pain, or any other concerns or questions.   You can reach [place] at [phone].   FOLLOW-UP:  1. You we will be contacted regarding scheduling of your follow-up ureteroscopy in approximately 2 weeks        AMBULATORY SURGERY  DISCHARGE INSTRUCTIONS   1) The drugs that you were given will stay in your system until tomorrow so for the next 24 hours you should not:  A) Drive an automobile B) Make any legal decisions C) Drink any alcoholic beverage   2) You may resume regular meals tomorrow.  Today it is better to start with liquids and gradually work up to solid foods.  You may eat anything you prefer, but it is better to start with liquids, then soup and crackers, and gradually work up to solid foods.   3) Please notify your doctor immediately if you have any unusual bleeding, trouble breathing, redness and pain at the surgery site, drainage, fever, or pain not relieved by  medication.    4) Additional Instructions:        Please contact your physician with any problems or Same Day Surgery at [phone], Monday through Friday 6 am to 4 pm, or [place] at [facility] number at [phone].

## 2020-07-06 NOTE — Op Note (Signed)
Preoperative diagnosis: Left nephrolithiasis  Postoperative diagnosis: Left nephrolithiasis  Procedure:  1. Cystoscopy 2. Left ureteroscopy 3. Left ureteral stent placement (6FR/24 cm) 4. Left retrograde pyelography with interpretation  Surgeon: Nicki Reaper C. , M.D.  Anesthesia: General  Complications: None  Intraoperative findings:  1. Cystoscopy-bladder mucosa normal in appearance without erythema, solid or papillary lesions.  UOs normal-appearing bilaterally 2. Ureteroscopy-no stricture or calculi however ureteral caliber diffusely small and would not allow passage of the flexible ureteroscope into the collecting system 3. Left retrograde pyelogram-narrowed upper pole infundibulum with hydrocalyx  EBL: Minimal  Specimens: 1. None   Indication: Tonya Walton is a 70 y.o. female with history of recurrent UTI and microhematuria with findings of a 7 mm left infundibular calculus with hydrocalyx. After reviewing the management options for treatment, the patient elected to proceed with the above surgical procedure(s). We have discussed the potential benefits and risks of the procedure, side effects of the proposed treatment, the likelihood of the patient achieving the goals of the procedure, and any potential problems that might occur during the procedure or recuperation. Informed consent has been obtained.  Description of procedure:  The patient was taken to the operating room and general anesthesia was induced.  The patient was placed in the dorsal lithotomy position, prepped and draped in the usual sterile fashion, and preoperative antibiotics were administered. A preoperative time-out was performed.   A 21 French cystoscope was lubricated and passed per urethra.  Panendoscopy was then performed with findings as described above.  Attention was directed to the left ureteral orifice and a 0.038 Sensor wire was then advanced up the ureter.  Resistance was met in the lower  proximal ureter.  The guidewire was left in place and the cystoscope was removed.  A 4.5 Fr semirigid ureteroscope was then advanced into the ureter next to the guidewire.  There was some tortuosity in the area of the proximal ureter but no calculus or stricture.  The ureteroscope was able to be advanced to the upper proximal ureter.  The guidewire was removed and replaced with ureteroscope into the renal pelvis.  A dual-lumen ureteral catheter was then placed over the safety wire retrograde pyelogram was performed with findings as described above.  The dual-lumen catheter would not advance into the upper proximal ureter.  A second Sensor wire was placed as a working wire through the dual-lumen catheter and the catheter was removed.    A single channel digital flexible ureteroscope was then placed over the working wire and into the distal ureter however could not be further advanced.  The Sensor wire was removed and replaced with a Super Stiff wire.  The ureteroscope was then able to be advanced through the distal and mid ureter over the Super Stiff wire however resistance was met and the proximal ureter and the ureteroscope was unable to be advanced into the renal pelvis.  At this point it was elected to place a ureteral stent to allow for passive ureteral dilation.  The ureteroscope was removed.  A 6FR/24 cm Contour ureteral stent was placed under fluoroscopic guidance over the safety wire.  The proximal curl was within the upper hydrocalyx in the distal curl was well positioned in the bladder.  The bladder was then emptied and the procedure ended.  The patient appeared to tolerate the procedure well and without complications.  After anesthetic reversal the patient was transported to the PACU in stable condition.   Plan:  She will be scheduled for follow-up ureteroscopy in  approximately 2 weeks   [name], MD

## 2020-07-06 NOTE — Anesthesia Postprocedure Evaluation (Signed)
Anesthesia Post Note  Patient: Tonya Walton  Procedure(s) Performed: CYSTOSCOPY/URETEROSCOPY/STENT PLACEMENT/ retrograde pyelogram (Left )  Patient location during evaluation: PACU Anesthesia Type: General Level of consciousness: awake and alert Pain management: pain level controlled Vital Signs Assessment: post-procedure vital signs reviewed and stable Respiratory status: spontaneous breathing, nonlabored ventilation, respiratory function stable and patient connected to nasal cannula oxygen Cardiovascular status: blood pressure returned to baseline and stable Postop Assessment: no apparent nausea or vomiting Anesthetic complications: no   No complications documented.   Last Vitals:  Vitals:   07/06/20 0859 07/06/20 0916  BP:  (!) 169/66  Pulse:  73  Resp:  16  Temp: 36.5 C 36.8 C  SpO2:  96%    Last Pain:  Vitals:   07/06/20 0916  TempSrc: Temporal  PainSc: 0-No pain                 Molli Barrows

## 2020-07-08 ENCOUNTER — Telehealth: Payer: Self-pay

## 2020-07-08 ENCOUNTER — Other Ambulatory Visit: Payer: Self-pay | Admitting: Urology

## 2020-07-08 DIAGNOSIS — N200 Calculus of kidney: Secondary | ICD-10-CM

## 2020-07-08 NOTE — Telephone Encounter (Signed)
Patient called asking if she needs to be schedule for another surgery. Please advise. [phone]

## 2020-07-12 ENCOUNTER — Other Ambulatory Visit: Payer: Self-pay

## 2020-07-12 DIAGNOSIS — N200 Calculus of kidney: Secondary | ICD-10-CM

## 2020-07-13 ENCOUNTER — Ambulatory Visit: Payer: Medicare HMO

## 2020-07-13 ENCOUNTER — Other Ambulatory Visit: Payer: Self-pay

## 2020-07-13 DIAGNOSIS — N200 Calculus of kidney: Secondary | ICD-10-CM

## 2020-07-15 ENCOUNTER — Other Ambulatory Visit: Payer: Self-pay | Admitting: *Deleted

## 2020-07-15 MED ORDER — TAMSULOSIN HCL 0.4 MG PO CAPS
0.4000 mg | ORAL_CAPSULE | Freq: Every day | ORAL | 0 refills | Status: DC
Start: 2020-07-15 — End: 2020-08-10

## 2020-07-16 LAB — CULTURE, URINE COMPREHENSIVE

## 2020-07-19 ENCOUNTER — Encounter
Admission: RE | Admit: 2020-07-19 | Discharge: 2020-07-19 | Disposition: A | Discharge status: Home / Self Care | Payer: Medicare HMO | Source: Ambulatory Visit | Attending: Urology | Admitting: Urology

## 2020-07-19 NOTE — Patient Instructions (Addendum)
Your procedure is scheduled on:07-27-20 TUESDAY Report to the Registration Desk on the 1st floor of the [facility]-Then proceed to the 2nd floor Surgery Desk in the [place] To find out your arrival time, please call [phone] between 1PM - 3PM on:07-26-20 MONDAY  REMEMBER: Instructions that are not followed completely may result in serious medical risk, up to and including death; or upon the discretion of your surgeon and anesthesiologist your surgery may need to be rescheduled.  Do not eat food OR drink any liquids after midnight the night before surgery.  No gum chewing, lozengers or hard candies.  TAKE THESE MEDICATIONS THE MORNING OF SURGERY WITH A SIP OF WATER: -Allopurinol (Zyloprim) -Lipitor (Atorvastatin) -Cardizem (Diltiazem) -Lamictal (Lamotrigine) -Synthroid (levothyroxine) -Flomax (Tamsulosin) -Trospium Chloride -Protonix (Pantoprazole)-take one the night before and one on the morning of surgery - helps to prevent nausea after surgery.)  81 mg Aspirin is to be stopped 3 days prior to surgery as instructed by Dr Dene Gentry office-Last dose on 07-23-20 Friday  One week prior to surgery: Stop Anti-inflammatories (NSAIDS) such as Advil, Aleve, Ibuprofen, Motrin, Naproxen, Naprosyn and Aspirin based products such as Excedrin, Goodys Powder, BC Powder.You may however, continue to take Tylenol/Hydrocodone if needed for pain up until the day of surgery.  Stop ANY OVER THE COUNTER supplements/vitamins NOW (07-19-20) until after surgery.   No Alcohol for 24 hours before or after surgery.  No Smoking including e-cigarettes for 24 hours prior to surgery.  No chewable tobacco products for at least 6 hours prior to surgery.  No nicotine patches on the day of surgery.  Do not use any "recreational" drugs for at least a week prior to your surgery.  Please be advised that the combination of cocaine and anesthesia may have negative outcomes, up to and including death. If you test  positive for cocaine, your surgery will be cancelled.  On the morning of surgery brush your teeth with toothpaste and water, you may rinse your mouth with mouthwash if you wish. Do not swallow any toothpaste or mouthwash.  Do not wear jewelry, make-up, hairpins, clips or nail polish.  Do not wear lotions, powders, or perfumes.   Do not shave body from the neck down 48 hours prior to surgery just in case you cut yourself which could leave a site for infection.  Also, freshly shaved skin may become irritated if using the CHG soap.  Contact lenses, hearing aids and dentures may not be worn into surgery.  Do not bring valuables to the hospital. [facility] is not responsible for any missing/lost belongings or valuables.   Notify your doctor if there is any change in your medical condition (cold, fever, infection).  Wear comfortable clothing (specific to your surgery type) to the hospital.  After surgery, you can help prevent lung complications by doing breathing exercises.  Take deep breaths and cough every 1-2 hours. Your doctor may order a device called an Incentive Spirometer to help you take deep breaths. When coughing or sneezing, hold a pillow firmly against your incision with both hands. This is called "splinting." Doing this helps protect your incision. It also decreases belly discomfort.  If you are being admitted to the hospital overnight, leave your suitcase in the car. After surgery it may be brought to your room.  If you are being discharged the day of surgery, you will not be allowed to drive home. You will need a responsible adult (18 years or older) to drive you home and stay with  you that night.   If you are taking public transportation, you will need to have a responsible adult (18 years or older) with you. Please confirm with your physician that it is acceptable to use public transportation.   Please call the [place] Dept. at [phone] if you have  any questions about these instructions.  Surgery Visitation Policy:  Patients undergoing a surgery or procedure may have one family member or support person with them as long as that person is not COVID-19 positive or experiencing its symptoms.  That person may remain in the waiting area during the procedure.  Inpatient Visitation:    Visiting hours are 7 a.m. to 8 p.m. Inpatients will be allowed two visitors daily. The visitors may change each day during the patient's stay. No visitors under the age of 42. Any visitor under the age of 51 must be accompanied by an adult. The visitor must pass COVID-19 screenings, use hand sanitizer when entering and exiting the patient's room and wear a mask at all times, including in the patient's room. Patients must also wear a mask when staff or their visitor are in the room. Masking is required regardless of vaccination status.

## 2020-07-20 NOTE — Progress Notes (Signed)
  Perioperative Services Pre-Admission/Anesthesia Testing    Date: 07/20/20  Name: Tonya Walton MRN:   [id number]  Re: Clearance for surgery   Case: [id number] Date/Time: 07/27/20 0942   Procedure: CYSTOSCOPY/URETEROSCOPY/HOLMIUM LASER/STENT EXCHANGE (Left)   Anesthesia type: Choice   Pre-op diagnosis: Left Nephrolithiasis   Location: [facility] OR ROOM 10 / [place] [facility] FOR ANESTHESIA GROUP   Surgeons: Abbie Sons, MD     Patient is scheduled for the above procedure on 07/27/2020.  Patient previously reviewed by PAT APP prior to urological procedure that was performed on 07/06/2020; see my note dated 07/05/2020.  Interval history reviewed.  Patient underwent an uncomplicated cystoscopy, ureteroscopy, and retrograde pyelography on 07/06/2020.  Patient had a LEFT ureteral stent placed.  Patient discharged home on POD-0 with plans to follow-up with outpatient cardiology for repeat ureteroscopy in approximately 2 weeks.  Patient has done well at home with no noted postoperative complications.  There have been no significant changes to patient's past medical history or medication regimen since last reviewed.  Labs and ECG are up-to-date.    Impression and Plan:  [name] has been referred for pre-anesthesia review and clearance prior to her undergoing the planned anesthetic and procedural courses. Available labs, pertinent testing, and imaging results were personally reviewed by me. This patient has been appropriately cleared by her PCP Sabra Heck, MD) and by cardiology Ubaldo Glassing, MD) both of whom placed patient at overall LOW risk for significant perioperative complications.  Based on clinical review performed today (07/20/20), barring any significant acute changes in the patient's overall condition, it is anticipated that she will be able to proceed with the planned surgical intervention. Any acute changes in clinical condition may necessitate her procedure being postponed and/or cancelled.  Patient will meet with anesthesia team (MD and/or CRNA) on this day of her procedure for preoperative evaluation/assessment.   Pre-surgical instructions were reviewed with the patient during her PAT appointment and questions were fielded by PAT clinical staff. Patient was advised that if any questions or concerns arise prior to her procedure then she should return a call to PAT and/or her surgeon's office to discuss.  Honor Loh, MSN, APRN, FNP-C, CEN [facility]  Peri-operative Services Nurse Practitioner Phone: [phone] 07/20/20 2:54 PM  NOTE: This note has been prepared using Dragon dictation software. Despite my best ability to proofread, there is always the potential that unintentional transcriptional errors may still occur from this process.

## 2020-07-27 ENCOUNTER — Ambulatory Visit: Payer: Medicare HMO | Admitting: Urgent Care

## 2020-07-27 ENCOUNTER — Encounter: Payer: Self-pay | Admitting: Urology

## 2020-07-27 ENCOUNTER — Ambulatory Visit
Admission: RE | Admit: 2020-07-27 | Discharge: 2020-07-27 | Disposition: A | Discharge status: Home / Self Care | Payer: Medicare HMO | Attending: Urology | Admitting: Urology

## 2020-07-27 ENCOUNTER — Other Ambulatory Visit: Payer: Self-pay

## 2020-07-27 ENCOUNTER — Ambulatory Visit: Payer: Medicare HMO

## 2020-07-27 ENCOUNTER — Encounter
Admission: RE | Disposition: A | Discharge status: Home / Self Care | Payer: Self-pay | Source: Home / Self Care | Attending: Urology

## 2020-07-27 DIAGNOSIS — Z808 Family history of malignant neoplasm of other organs or systems: Secondary | ICD-10-CM | POA: Diagnosis not present

## 2020-07-27 DIAGNOSIS — Z806 Family history of leukemia: Secondary | ICD-10-CM | POA: Insufficient documentation

## 2020-07-27 DIAGNOSIS — Z87440 Personal history of urinary (tract) infections: Secondary | ICD-10-CM | POA: Insufficient documentation

## 2020-07-27 DIAGNOSIS — Z87442 Personal history of urinary calculi: Secondary | ICD-10-CM | POA: Insufficient documentation

## 2020-07-27 DIAGNOSIS — N200 Calculus of kidney: Secondary | ICD-10-CM | POA: Diagnosis present

## 2020-07-27 DIAGNOSIS — Z833 Family history of diabetes mellitus: Secondary | ICD-10-CM | POA: Insufficient documentation

## 2020-07-27 DIAGNOSIS — Z803 Family history of malignant neoplasm of breast: Secondary | ICD-10-CM | POA: Diagnosis not present

## 2020-07-27 DIAGNOSIS — Z87891 Personal history of nicotine dependence: Secondary | ICD-10-CM | POA: Insufficient documentation

## 2020-07-27 DIAGNOSIS — Z951 Presence of aortocoronary bypass graft: Secondary | ICD-10-CM | POA: Insufficient documentation

## 2020-07-27 DIAGNOSIS — Z7982 Long term (current) use of aspirin: Secondary | ICD-10-CM | POA: Insufficient documentation

## 2020-07-27 DIAGNOSIS — I110 Hypertensive heart disease with heart failure: Secondary | ICD-10-CM | POA: Diagnosis not present

## 2020-07-27 DIAGNOSIS — Z79899 Other long term (current) drug therapy: Secondary | ICD-10-CM | POA: Insufficient documentation

## 2020-07-27 DIAGNOSIS — I509 Heart failure, unspecified: Secondary | ICD-10-CM | POA: Diagnosis not present

## 2020-07-27 DIAGNOSIS — Z8249 Family history of ischemic heart disease and other diseases of the circulatory system: Secondary | ICD-10-CM | POA: Diagnosis not present

## 2020-07-27 DIAGNOSIS — E039 Hypothyroidism, unspecified: Secondary | ICD-10-CM | POA: Insufficient documentation

## 2020-07-27 HISTORY — PX: CYSTOSCOPY/URETEROSCOPY/HOLMIUM LASER/STENT PLACEMENT: SHX6546

## 2020-07-27 SURGERY — CYSTOSCOPY/URETEROSCOPY/HOLMIUM LASER/STENT PLACEMENT
Anesthesia: General | Laterality: Left

## 2020-07-27 MED ORDER — SODIUM CHLORIDE 0.9 % IR SOLN
Status: DC | PRN
Start: 2020-07-27 — End: 2020-07-27
  Administered 2020-07-27: 3000 mL

## 2020-07-27 MED ORDER — ONDANSETRON HCL 4 MG/2ML IJ SOLN
INTRAMUSCULAR | Status: DC | PRN
  Administered 2020-07-27: 4 mg via INTRAVENOUS

## 2020-07-27 MED ORDER — CEFAZOLIN SODIUM-DEXTROSE 2-4 GM/100ML-% IV SOLN
2.0000 g | INTRAVENOUS | Status: AC
Start: 2020-07-27 — End: 2020-07-27
  Administered 2020-07-27: 2 g via INTRAVENOUS

## 2020-07-27 MED ORDER — FENTANYL CITRATE (PF) 100 MCG/2ML IJ SOLN
INTRAMUSCULAR | Status: DC | PRN
  Administered 2020-07-27 (×2): 50 ug via INTRAVENOUS

## 2020-07-27 MED ORDER — DEXAMETHASONE SODIUM PHOSPHATE 10 MG/ML IJ SOLN
INTRAMUSCULAR | Status: DC | PRN
  Administered 2020-07-27: 10 mg via INTRAVENOUS

## 2020-07-27 MED ORDER — LACTATED RINGERS IV SOLN: INTRAVENOUS | Status: DC

## 2020-07-27 MED ORDER — PROPOFOL 10 MG/ML IV BOLUS
INTRAVENOUS | Status: AC
  Filled 2020-07-27: qty 20

## 2020-07-27 MED ORDER — PROPOFOL 10 MG/ML IV BOLUS
INTRAVENOUS | Status: DC | PRN
  Administered 2020-07-27: 100 mg via INTRAVENOUS

## 2020-07-27 MED ORDER — OXYCODONE HCL 5 MG PO TABS: 5.0000 mg | ORAL_TABLET | Freq: Once | ORAL | Status: DC | PRN

## 2020-07-27 MED ORDER — FENTANYL CITRATE (PF) 100 MCG/2ML IJ SOLN: 25.0000 ug | INTRAMUSCULAR | Status: DC | PRN

## 2020-07-27 MED ORDER — IOHEXOL 180 MG/ML  SOLN
INTRAMUSCULAR | Status: DC | PRN
  Administered 2020-07-27: 10 mL

## 2020-07-27 MED ORDER — FENTANYL CITRATE (PF) 100 MCG/2ML IJ SOLN
INTRAMUSCULAR | Status: AC
  Filled 2020-07-27: qty 2

## 2020-07-27 MED ORDER — SUGAMMADEX SODIUM 200 MG/2ML IV SOLN
INTRAVENOUS | Status: DC | PRN
  Administered 2020-07-27: 200 mg via INTRAVENOUS

## 2020-07-27 MED ORDER — ACETAMINOPHEN 10 MG/ML IV SOLN
INTRAVENOUS | Status: DC | PRN
  Administered 2020-07-27: 1000 mg via INTRAVENOUS

## 2020-07-27 MED ORDER — CHLORHEXIDINE GLUCONATE 0.12 % MT SOLN
OROMUCOSAL | Status: AC
  Administered 2020-07-27: 15 mL via OROMUCOSAL
  Filled 2020-07-27: qty 15

## 2020-07-27 MED ORDER — LIDOCAINE HCL (CARDIAC) PF 100 MG/5ML IV SOSY
PREFILLED_SYRINGE | INTRAVENOUS | Status: DC | PRN
  Administered 2020-07-27: 100 mg via INTRAVENOUS

## 2020-07-27 MED ORDER — CHLORHEXIDINE GLUCONATE 0.12 % MT SOLN: 15.0000 mL | Freq: Once | OROMUCOSAL | Status: AC

## 2020-07-27 MED ORDER — SEVOFLURANE IN SOLN
RESPIRATORY_TRACT | Status: AC
  Filled 2020-07-27: qty 250

## 2020-07-27 MED ORDER — CEFAZOLIN SODIUM-DEXTROSE 2-4 GM/100ML-% IV SOLN
INTRAVENOUS | Status: AC
  Filled 2020-07-27: qty 100

## 2020-07-27 MED ORDER — OXYCODONE HCL 5 MG/5ML PO SOLN: 5.0000 mg | Freq: Once | ORAL | Status: DC | PRN

## 2020-07-27 MED ORDER — ORAL CARE MOUTH RINSE: 15.0000 mL | Freq: Once | OROMUCOSAL | Status: AC

## 2020-07-27 MED ORDER — 0.9 % SODIUM CHLORIDE (POUR BTL) OPTIME
TOPICAL | Status: DC | PRN
  Administered 2020-07-27: 1000 mL

## 2020-07-27 MED ORDER — PHENYLEPHRINE HCL (PRESSORS) 10 MG/ML IV SOLN
INTRAVENOUS | Status: AC
  Filled 2020-07-27: qty 1

## 2020-07-27 MED ORDER — DEXMEDETOMIDINE (PRECEDEX) IN NS 20 MCG/5ML (4 MCG/ML) IV SYRINGE
PREFILLED_SYRINGE | INTRAVENOUS | Status: DC | PRN
  Administered 2020-07-27 (×2): 4 ug via INTRAVENOUS

## 2020-07-27 MED ORDER — ROCURONIUM BROMIDE 100 MG/10ML IV SOLN
INTRAVENOUS | Status: DC | PRN
  Administered 2020-07-27: 50 mg via INTRAVENOUS
  Administered 2020-07-27: 20 mg via INTRAVENOUS

## 2020-07-27 SURGICAL SUPPLY — 32 items
BAG DRAIN CYSTO-URO LG1000N (MISCELLANEOUS) ×3 IMPLANT
BASKET ZERO TIP 1.9FR (BASKET) IMPLANT
BRUSH SCRUB EZ 1% IODOPHOR (MISCELLANEOUS) ×3 IMPLANT
BSKT STON RTRVL ZERO TP 1.9FR (BASKET)
CATH URET FLEX-TIP 2 LUMEN 10F (CATHETERS) ×3 IMPLANT
CATH URETL 5X70 OPEN END (CATHETERS) ×3 IMPLANT
CNTNR SPEC 2.5X3XGRAD LEK (MISCELLANEOUS)
CONT SPEC 4OZ STER OR WHT (MISCELLANEOUS)
CONT SPEC 4OZ STRL OR WHT (MISCELLANEOUS)
CONTAINER SPEC 2.5X3XGRAD LEK (MISCELLANEOUS) IMPLANT
DRAPE UTILITY 15X26 TOWEL STRL (DRAPES) ×3 IMPLANT
GAUZE 4X4 16PLY ~~LOC~~+RFID DBL (SPONGE) ×3 IMPLANT
GLOVE SURG UNDER POLY LF SZ7.5 (GLOVE) ×3 IMPLANT
GOWN STRL REUS W/ TWL LRG LVL3 (GOWN DISPOSABLE) ×2 IMPLANT
GOWN STRL REUS W/ TWL XL LVL3 (GOWN DISPOSABLE) ×1 IMPLANT
GOWN STRL REUS W/TWL LRG LVL3 (GOWN DISPOSABLE) ×6
GOWN STRL REUS W/TWL XL LVL3 (GOWN DISPOSABLE) ×3
GUIDEWIRE STR DUAL SENSOR (WIRE) ×3 IMPLANT
INFUSOR MANOMETER BAG 3000ML (MISCELLANEOUS) ×3 IMPLANT
IV NS IRRIG 3000ML ARTHROMATIC (IV SOLUTION) ×3 IMPLANT
KIT TURNOVER CYSTO (KITS) ×3 IMPLANT
PACK CYSTO AR (MISCELLANEOUS) ×3 IMPLANT
SENSORWIRE 0.038 NOT ANGLED (WIRE) ×3
SET CYSTO W/LG BORE CLAMP LF (SET/KITS/TRAYS/PACK) ×3 IMPLANT
SHEATH URETERAL 12FRX35CM (MISCELLANEOUS) IMPLANT
STENT URET 6FRX24 CONTOUR (STENTS) ×3 IMPLANT
STENT URET 6FRX26 CONTOUR (STENTS) IMPLANT
SURGILUBE 2OZ TUBE FLIPTOP (MISCELLANEOUS) ×3 IMPLANT
TRACTIP FLEXIVA PULSE ID 200 (Laser) ×3 IMPLANT
VALVE UROSEAL ADJ ENDO (VALVE) ×3 IMPLANT
WATER STERILE IRR 1000ML POUR (IV SOLUTION) ×3 IMPLANT
WIRE SENSOR 0.038 NOT ANGLED (WIRE) ×1 IMPLANT

## 2020-07-27 NOTE — Anesthesia Postprocedure Evaluation (Signed)
Anesthesia Post Note  Patient: Tonya Walton  Procedure(s) Performed: CYSTOSCOPY/URETEROSCOPY/HOLMIUM LASER/STENT EXCHANGE (Left)  Patient location during evaluation: PACU Anesthesia Type: General Level of consciousness: awake and alert Pain management: pain level controlled Vital Signs Assessment: post-procedure vital signs reviewed and stable Respiratory status: spontaneous breathing, nonlabored ventilation, respiratory function stable and patient connected to nasal cannula oxygen Cardiovascular status: blood pressure returned to baseline and stable Postop Assessment: no apparent nausea or vomiting Anesthetic complications: no   No notable events documented.   Last Vitals:  Vitals:   07/27/20 0945 07/27/20 0958  BP: (!) 147/58 (!) 154/50  Pulse: 67 70  Resp: 18 16  Temp: 36.4 C (!) 36.1 C  SpO2: 95% 94%    Last Pain:  Vitals:   07/27/20 0958  TempSrc: Temporal  PainSc: 0-No pain                 [name]

## 2020-07-27 NOTE — Interval H&P Note (Signed)
History and Physical Interval Note:  70 y.o. female with left nephrolithiasis and hydrocalyx.  Attempt at ureteroscopic stone removal last month unsuccessful due to ureteral anatomy and inability to access the collecting system.  A stent was placed and she presents for follow-up ureteroscopy.  All questions were answered.  We discussed the stent will need to be replaced postoperatively and remain indwelling for approximately 1 week  07/27/2020 7:11 AM  Tonya Walton  has presented today for surgery, with the diagnosis of Left Nephrolithiasis.  The various methods of treatment have been discussed with the patient and family. After consideration of risks, benefits and other options for treatment, the patient has consented to  Procedure(s): CYSTOSCOPY/URETEROSCOPY/HOLMIUM LASER/STENT EXCHANGE (Left) as a surgical intervention.  The patient's history has been reviewed, patient examined, no change in status, stable for surgery.  I have reviewed the patient's chart and labs.  Questions were answered to the patient's satisfaction.     [place]

## 2020-07-27 NOTE — Discharge Instructions (Addendum)
DISCHARGE INSTRUCTIONS FOR KIDNEY STONE/URETERAL STENT   MEDICATIONS:  1. Resume all your other meds from home.   ACTIVITY:  1. May resume regular activities in 24 hours. 2. No driving while on narcotic pain medications  3. Drink plenty of water  4. Continue to walk at home - you can still get blood clots when you are at home, so keep active, but don't over do it.  5. May return to work/school tomorrow or when you feel ready   SIGNS/SYMPTOMS TO CALL:  Common postoperative symptoms include urinary frequency, urgency, bladder spasm and blood in the urine  Please call us if you have a fever greater than 101.5, uncontrolled nausea/vomiting, uncontrolled pain, dizziness, unable to urinate, excessively bloody urine, chest pain, shortness of breath, leg swelling, leg pain, or any other concerns or questions.   You can reach [place] at [phone].   FOLLOW-UP:  1. You we will be contacted for an appointment for stent removal next week  [place]   The drugs that you were given will stay in your system until tomorrow so for the next 24 hours you should not:  Drive an automobile Make any legal decisions Drink any alcoholic beverage   You may resume regular meals tomorrow.  Today it is better to start with liquids and gradually work up to solid foods.  You may eat anything you prefer, but it is better to start with liquids, then soup and crackers, and gradually work up to solid foods.   Please notify your doctor immediately if you have any unusual bleeding, trouble breathing, redness and pain at the surgery site, drainage, fever, or pain not relieved by medication.    Additional Instructions:        Please contact your physician with any problems or Same Day Surgery at [phone], Monday through Friday 6 am to 4 pm, or [place] at [facility] number at [phone].

## 2020-07-27 NOTE — Transfer of Care (Signed)
Immediate Anesthesia Transfer of Care Note  Patient: Tonya Walton  Procedure(s) Performed: CYSTOSCOPY/URETEROSCOPY/HOLMIUM LASER/STENT EXCHANGE (Left)  Patient Location: PACU  Anesthesia Type:General  Level of Consciousness: drowsy and patient cooperative  Airway & Oxygen Therapy: Patient Spontanous Breathing and Patient connected to face mask oxygen  Post-op Assessment: Report given to RN and Post -op Vital signs reviewed and stable  Post vital signs: Reviewed and stable  Last Vitals:  Vitals Value Taken Time  BP 154/55 07/27/20 0856  Temp 36.3 C 07/27/20 0856  Pulse 67 07/27/20 0859  Resp 21 07/27/20 0859  SpO2 100 % 07/27/20 0859  Vitals shown include unvalidated device data.  Last Pain:  Vitals:   07/27/20 0856  TempSrc:   PainSc: Asleep      Patients Stated Pain Goal: 0 (99/24/26 8341)  Complications: No notable events documented.

## 2020-07-27 NOTE — Anesthesia Preprocedure Evaluation (Signed)
Anesthesia Evaluation  Patient identified by MRN, date of birth, ID band Patient awake    Reviewed: Allergy & Precautions, NPO status , Patient's Chart, lab work & pertinent test results  History of Anesthesia Complications (+) Family history of anesthesia reaction and history of anesthetic complications  Airway Mallampati: III  TM Distance: <3 FB Neck ROM: limited    Dental  (+) Chipped, Poor Dentition, Missing   Pulmonary shortness of breath, COPD, former smoker,    Pulmonary exam normal        Cardiovascular Exercise Tolerance: Good hypertension, (-) angina+ CAD, + CABG and +CHF  Normal cardiovascular exam     Neuro/Psych  Headaches, Seizures -, Well Controlled,  PSYCHIATRIC DISORDERS  Neuromuscular disease CVA, Residual Symptoms    GI/Hepatic Neg liver ROS, GERD  Medicated and Controlled,  Endo/Other  Hypothyroidism   Renal/GU      Musculoskeletal  (+) Arthritis ,   Abdominal   Peds  Hematology negative hematology ROS (+)   Anesthesia Other Findings Past Medical History: No date: Anxiety No date: Arthritis No date: Atrial fibrillation (HCC) No date: CHF (congestive heart failure) (HCC) No date: COPD (chronic obstructive pulmonary disease) (HCC) No date: Coronary artery disease No date: Depression No date: Family history of adverse reaction to anesthesia     Comment:  brothers had a lung collaspe during an unknown               procedure. No date: GERD (gastroesophageal reflux disease) No date: Headache No date: History of kidney stones 04/01/2015: Hx of CABG     Comment:  Single vessel; LIMA-LAD No date: Hypertension No date: Hypothyroidism No date: Insomnia 09/27/2010: Invasive ductal carcinoma of breast, female, right ([facility] )     Comment:  Stage 1A; ER/PR (+) and HER2/neu indeterminant; pT1b pN0              (sn) No date: Neuromuscular disorder ([place])     Comment:  nerve damage in legs 2012:  Personal history of radiation therapy     Comment:  BREAST CA No date: Restless leg syndrome No date: Seizures ([place]) No date: Shortness of breath dyspnea No date: Sjogren's syndrome (HCC) No date: Stroke [facility])     Comment:  Some mild weakness in left side residual  Past Surgical History: No date: APPENDECTOMY 07/2013: BREAST BIOPSY; Left     Comment:  NEG 08/22/2016: BREAST BIOPSY; Right     Comment:  benign 01/07/2019: BREAST BIOPSY; Right     Comment:  asymmetry bx with stereo, coil marker,fat necrosis 01/07/2019: BREAST BIOPSY; Right     Comment:  asymm affirm bx,  x marker,  fat necrosis 03/25/2020: BREAST BIOPSY; Right     Comment:  stereo bx, ribbon clip, path pending  2012: BREAST EXCISIONAL BIOPSY; Right     Comment:  POS 2012: BREAST LUMPECTOMY     Comment:  Right breast 03/02/2015: CARDIAC CATHETERIZATION; N/A     Comment:  Procedure: Left Heart Cath and Coronary Angiography;                Surgeon: Teodoro Spray, MD;  Location: [place] [facility]               [facility];  Service: Cardiovascular;  Laterality: N/A; 10/14/2014: CATARACT EXTRACTION W/PHACO; Right     Comment:  Procedure: CATARACT EXTRACTION PHACO AND INTRAOCULAR               LENS PLACEMENT ([place]);  Surgeon: Leandrew Koyanagi, MD;  Location: [place];  Service: Ophthalmology;                Laterality: Right; No date: COLONOSCOPY 04/01/2015: CORONARY ARTERY BYPASS GRAFT; N/A     Comment:  Procedure: CORONARY ARTERY BYPASS GRAFTING (CABG) x one               , using left mammary artery;  Surgeon: Ivin Poot,               MD;  Location: [place];  Service: Open Heart Surgery;                Laterality: N/A; 07/06/2020: CYSTOSCOPY/URETEROSCOPY/HOLMIUM LASER/STENT PLACEMENT; Left     Comment:  Procedure: CYSTOSCOPY/URETEROSCOPY/STENT PLACEMENT/               retrograde pyelogram;  Surgeon: Abbie Sons, MD;                Location: [facility];  Service: Urology;  Laterality: Left; No  date: JOINT REPLACEMENT; Right     Comment:  Total Knee Replacement 04/01/2015: TEE WITHOUT CARDIOVERSION; N/A     Comment:  Procedure: TRANSESOPHAGEAL ECHOCARDIOGRAM (TEE);                Surgeon: Ivin Poot, MD;  Location: [place];  Service:              Open Heart Surgery;  Laterality: N/A; No date: TONSILLECTOMY 06/10/2014: TOTAL KNEE ARTHROPLASTY; Right     Comment:  Procedure: TOTAL KNEE ARTHROPLASTY;  Surgeon: Dereck Leep, MD;  Location: [facility];  Service: Orthopedics;                Laterality: Right; 09/02/2014: TOTAL KNEE ARTHROPLASTY; Left     Comment:  Procedure: TOTAL KNEE ARTHROPLASTY;  Surgeon: Dereck Leep, MD;  Location: [facility];  Service: Orthopedics;                Laterality: Left;  BMI    Body Mass Index: 38.25 kg/m      Reproductive/Obstetrics negative OB ROS                             Anesthesia Physical Anesthesia Plan  ASA: 3  Anesthesia Plan: General ETT   Post-op Pain Management:    Induction: Intravenous  PONV Risk Score and Plan: Ondansetron, Dexamethasone, Midazolam and Treatment may vary due to age or medical condition  Airway Management Planned: Oral ETT  Additional Equipment:   Intra-op Plan:   Post-operative Plan: Extubation in OR  Informed Consent: I have reviewed the patients History and Physical, chart, labs and discussed the procedure including the risks, benefits and alternatives for the proposed anesthesia with the patient or authorized representative who has indicated his/her understanding and acceptance.     Dental Advisory Given  Plan Discussed with: Anesthesiologist, CRNA and Surgeon  Anesthesia Plan Comments: (Patient consented for risks of anesthesia including but not limited to:  - adverse reactions to medications - damage to eyes, teeth, lips or other oral mucosa - nerve damage due to positioning  - sore throat or hoarseness - Damage to heart, brain,  nerves, lungs, other parts of body or loss of life  Patient voiced understanding.)  Anesthesia Quick Evaluation  

## 2020-07-27 NOTE — Op Note (Signed)
Preoperative diagnosis: Left nephrolithiasis   Postoperative diagnosis: Left nephrolithiasis  Procedure:  Cystoscopy Left ureteroscopy and stone removal Ureteroscopic laser lithotripsy Left ureteral stent exchange (6FR/24 cm)  Left retrograde pyelography with interpretation  Surgeon: Nicki Reaper C. Kalai Baca, M.D.  Anesthesia: General  Complications: None  Intraoperative findings:  Cystoscopy-bladder mucosa normal in appearance without erythema, solid or papillary lesions with the exception of inflammatory changes at the left UO secondary to her indwelling stent Ureteropyeloscopy-no mucosal abnormalities of the ureter were identified.  7 mm calculus located and and upper pole infundibulum Left retrograde pyelography post procedure showed no filling defects, stone fragments or contrast extravasation  EBL: Minimal  Specimens: Calculus fragments for analysis   Indication: Tonya Walton is a 70 y.o. female with history of recurrent UTI and microhematuria with findings of a 7 mm left infundibular calculus with hydrocalyx.  She underwent an attempt at ureteroscopic stone removal 07/06/2020 however due to ureteral anatomy the flexible ureteroscope could not be advanced to the collecting system.  She presents for follow-up ureteroscopy after a period of passive stent dilation.  After reviewing the management options for treatment, the patient elected to proceed with the above surgical procedure(s). We have discussed the potential benefits and risks of the procedure, side effects of the proposed treatment, the likelihood of the patient achieving the goals of the procedure, and any potential problems that might occur during the procedure or recuperation. Informed consent has been obtained.  Description of procedure:  The patient was taken to the operating room and general anesthesia was induced.  The patient was placed in the dorsal lithotomy position, prepped and draped in the usual sterile fashion,  and preoperative antibiotics were administered. A preoperative time-out was performed.   A 21 French cystoscope was lubricated and passed per urethra with findings as described above.  The indwelling stent was grasped with endoscopic forceps and brought out to the urethral meatus.    A 0.038 Sensor wire was then placed through the stent and advanced into the renal pelvis without difficulty followed by stent removal.  A single channel digital flexible ureteroscope was then advanced into the left distal ureter alongside the guidewire.  In the upper portion of the distal ureter slight narrowing was noted which the scope was unable to traverse.  A second Sensor wire was placed to the ureteroscope and through this narrowed area and the scope was able to be advanced over the wire without difficulty.  The guidewire was then removed and the ureteroscope was advanced into the renal pelvis with findings as described above.  A 242 micron holmium laser fiber was placed through the ureteroscope and the stone was dusted at a settings of 0.2 J / 40 Hz.  The calculus was hard and during dusting portions of the calculus fragmented off the main stone and migrated to the upper pole calyx.  Once the main stone was completely dusted the ureteroscope was advanced into the upper pole calyx and noncontact laser lithotripsy was then performed at 0.6 J / 40 Hz  For fragments estimated between 2-4 mm were removed with a 1.9 [place] nitinol basket.  Retrograde pyelogram was then performed with findings as described above.  All calyces were accessed under fluoroscopic guidance and no fragments estimated at >1 mm were identified.  The ureteroscope was withdrawn under direct vision and no ureteral fragments were identified.  A 6 FR/24 CM Contour ureteral stent was placed under fluoroscopic guidance.  The wire was then removed with an adequate stent curl  noted in the upper pole calyx as well as in the bladder.  The bladder was  then emptied and the procedure ended.  The patient appeared to tolerate the procedure well and without complications.  After anesthetic reversal the patient was transported to the PACU in stable condition.   Plan: She will be scheduled for stent removal in the office next week   [name], MD

## 2020-07-27 NOTE — Anesthesia Procedure Notes (Signed)
Procedure Name: Intubation Date/Time: 07/27/2020 7:32 AM Performed by: Biagio Borg, CRNA Pre-anesthesia Checklist: Patient identified, Emergency Drugs available, Suction available and Patient being monitored Patient Re-evaluated:Patient Re-evaluated prior to induction Oxygen Delivery Method: Circle system utilized Preoxygenation: Pre-oxygenation with 100% oxygen Induction Type: IV induction Ventilation: Mask ventilation without difficulty Laryngoscope Size: McGraph and 3 Grade View: Grade I Tube type: Oral Number of attempts: 1 Airway Equipment and Method: Stylet Placement Confirmation: ETT inserted through vocal cords under direct vision, positive ETCO2 and breath sounds checked- equal and bilateral Secured at: 21 cm Tube secured with: Tape Dental Injury: Teeth and Oropharynx as per pre-operative assessment

## 2020-07-30 ENCOUNTER — Observation Stay: Payer: Medicare HMO

## 2020-07-30 ENCOUNTER — Inpatient Hospital Stay
Admission: EM | Admit: 2020-07-30 | Discharge: 2020-08-03 | DRG: 872 | Disposition: A | Discharge status: Home Health | Payer: Medicare HMO | Attending: Internal Medicine | Admitting: Internal Medicine

## 2020-07-30 ENCOUNTER — Emergency Department: Payer: Medicare HMO

## 2020-07-30 ENCOUNTER — Other Ambulatory Visit: Payer: Self-pay

## 2020-07-30 DIAGNOSIS — F411 Generalized anxiety disorder: Secondary | ICD-10-CM | POA: Diagnosis present

## 2020-07-30 DIAGNOSIS — N390 Urinary tract infection, site not specified: Secondary | ICD-10-CM | POA: Diagnosis present

## 2020-07-30 DIAGNOSIS — I480 Paroxysmal atrial fibrillation: Secondary | ICD-10-CM | POA: Diagnosis present

## 2020-07-30 DIAGNOSIS — E039 Hypothyroidism, unspecified: Secondary | ICD-10-CM | POA: Diagnosis present

## 2020-07-30 DIAGNOSIS — G2581 Restless legs syndrome: Secondary | ICD-10-CM | POA: Diagnosis present

## 2020-07-30 DIAGNOSIS — R531 Weakness: Secondary | ICD-10-CM

## 2020-07-30 DIAGNOSIS — D729 Disorder of white blood cells, unspecified: Secondary | ICD-10-CM

## 2020-07-30 DIAGNOSIS — Z8249 Family history of ischemic heart disease and other diseases of the circulatory system: Secondary | ICD-10-CM

## 2020-07-30 DIAGNOSIS — I252 Old myocardial infarction: Secondary | ICD-10-CM

## 2020-07-30 DIAGNOSIS — G4700 Insomnia, unspecified: Secondary | ICD-10-CM | POA: Diagnosis present

## 2020-07-30 DIAGNOSIS — M1990 Unspecified osteoarthritis, unspecified site: Secondary | ICD-10-CM | POA: Diagnosis present

## 2020-07-30 DIAGNOSIS — Z853 Personal history of malignant neoplasm of breast: Secondary | ICD-10-CM

## 2020-07-30 DIAGNOSIS — Z792 Long term (current) use of antibiotics: Secondary | ICD-10-CM

## 2020-07-30 DIAGNOSIS — Z79899 Other long term (current) drug therapy: Secondary | ICD-10-CM

## 2020-07-30 DIAGNOSIS — E785 Hyperlipidemia, unspecified: Secondary | ICD-10-CM | POA: Diagnosis present

## 2020-07-30 DIAGNOSIS — A419 Sepsis, unspecified organism: Secondary | ICD-10-CM | POA: Diagnosis not present

## 2020-07-30 DIAGNOSIS — G40909 Epilepsy, unspecified, not intractable, without status epilepticus: Secondary | ICD-10-CM | POA: Diagnosis present

## 2020-07-30 DIAGNOSIS — Z20822 Contact with and (suspected) exposure to covid-19: Secondary | ICD-10-CM | POA: Diagnosis present

## 2020-07-30 DIAGNOSIS — I2510 Atherosclerotic heart disease of native coronary artery without angina pectoris: Secondary | ICD-10-CM | POA: Diagnosis present

## 2020-07-30 DIAGNOSIS — Z833 Family history of diabetes mellitus: Secondary | ICD-10-CM

## 2020-07-30 DIAGNOSIS — Z87891 Personal history of nicotine dependence: Secondary | ICD-10-CM

## 2020-07-30 DIAGNOSIS — Z96653 Presence of artificial knee joint, bilateral: Secondary | ICD-10-CM | POA: Diagnosis present

## 2020-07-30 DIAGNOSIS — I10 Essential (primary) hypertension: Secondary | ICD-10-CM | POA: Diagnosis present

## 2020-07-30 DIAGNOSIS — K529 Noninfective gastroenteritis and colitis, unspecified: Secondary | ICD-10-CM | POA: Diagnosis not present

## 2020-07-30 DIAGNOSIS — F329 Major depressive disorder, single episode, unspecified: Secondary | ICD-10-CM | POA: Diagnosis present

## 2020-07-30 DIAGNOSIS — K219 Gastro-esophageal reflux disease without esophagitis: Secondary | ICD-10-CM | POA: Diagnosis present

## 2020-07-30 DIAGNOSIS — Z7982 Long term (current) use of aspirin: Secondary | ICD-10-CM

## 2020-07-30 DIAGNOSIS — N3000 Acute cystitis without hematuria: Secondary | ICD-10-CM

## 2020-07-30 DIAGNOSIS — Z803 Family history of malignant neoplasm of breast: Secondary | ICD-10-CM

## 2020-07-30 DIAGNOSIS — N179 Acute kidney failure, unspecified: Secondary | ICD-10-CM | POA: Diagnosis present

## 2020-07-30 DIAGNOSIS — J810 Acute pulmonary edema: Secondary | ICD-10-CM

## 2020-07-30 DIAGNOSIS — F331 Major depressive disorder, recurrent, moderate: Secondary | ICD-10-CM | POA: Diagnosis present

## 2020-07-30 DIAGNOSIS — Z79890 Hormone replacement therapy (postmenopausal): Secondary | ICD-10-CM

## 2020-07-30 DIAGNOSIS — Z806 Family history of leukemia: Secondary | ICD-10-CM

## 2020-07-30 DIAGNOSIS — Z8673 Personal history of transient ischemic attack (TIA), and cerebral infarction without residual deficits: Secondary | ICD-10-CM

## 2020-07-30 DIAGNOSIS — F322 Major depressive disorder, single episode, severe without psychotic features: Secondary | ICD-10-CM | POA: Diagnosis present

## 2020-07-30 DIAGNOSIS — Z791 Long term (current) use of non-steroidal anti-inflammatories (NSAID): Secondary | ICD-10-CM

## 2020-07-30 DIAGNOSIS — Z923 Personal history of irradiation: Secondary | ICD-10-CM

## 2020-07-30 DIAGNOSIS — F419 Anxiety disorder, unspecified: Secondary | ICD-10-CM | POA: Diagnosis present

## 2020-07-30 DIAGNOSIS — J449 Chronic obstructive pulmonary disease, unspecified: Secondary | ICD-10-CM | POA: Diagnosis present

## 2020-07-30 LAB — CBC WITH DIFFERENTIAL/PLATELET
Abs Immature Granulocytes: 0.18 10*3/uL — ABNORMAL HIGH (ref 0.00–0.07)
Basophils Absolute: 0.1 10*3/uL (ref 0.0–0.1)
Basophils Relative: 0 %
Eosinophils Absolute: 0 10*3/uL (ref 0.0–0.5)
Eosinophils Relative: 0 %
HCT: 33.8 % — ABNORMAL LOW (ref 36.0–46.0)
Hemoglobin: 10.9 g/dL — ABNORMAL LOW (ref 12.0–15.0)
Immature Granulocytes: 1 %
Lymphocytes Relative: 3 %
Lymphs Abs: 0.6 10*3/uL — ABNORMAL LOW (ref 0.7–4.0)
MCH: 30.2 pg (ref 26.0–34.0)
MCHC: 32.2 g/dL (ref 30.0–36.0)
MCV: 93.6 fL (ref 80.0–100.0)
Monocytes Absolute: 1.3 10*3/uL — ABNORMAL HIGH (ref 0.1–1.0)
Monocytes Relative: 7 %
Neutro Abs: 17.1 10*3/uL — ABNORMAL HIGH (ref 1.7–7.7)
Neutrophils Relative %: 89 %
Platelets: 179 10*3/uL (ref 150–400)
RBC: 3.61 MIL/uL — ABNORMAL LOW (ref 3.87–5.11)
RDW: 14.5 % (ref 11.5–15.5)
WBC: 19.2 10*3/uL — ABNORMAL HIGH (ref 4.0–10.5)
nRBC: 0 % (ref 0.0–0.2)

## 2020-07-30 LAB — COMPREHENSIVE METABOLIC PANEL
ALT: 10 U/L (ref 0–44)
AST: 26 U/L (ref 15–41)
Albumin: 3.2 g/dL — ABNORMAL LOW (ref 3.5–5.0)
Alkaline Phosphatase: 78 U/L (ref 38–126)
Anion gap: 11 (ref 5–15)
BUN: 20 mg/dL (ref 8–23)
CO2: 22 mmol/L (ref 22–32)
Calcium: 8 mg/dL — ABNORMAL LOW (ref 8.9–10.3)
Chloride: 100 mmol/L (ref 98–111)
Creatinine, Ser: 1.3 mg/dL — ABNORMAL HIGH (ref 0.44–1.00)
GFR, Estimated: 44 mL/min — ABNORMAL LOW (ref >60)
Glucose, Bld: 172 mg/dL — ABNORMAL HIGH (ref 70–99)
Potassium: 3.5 mmol/L (ref 3.5–5.1)
Sodium: 133 mmol/L — ABNORMAL LOW (ref 135–145)
Total Bilirubin: 1.2 mg/dL (ref 0.3–1.2)
Total Protein: 7.2 g/dL (ref 6.5–8.1)

## 2020-07-30 LAB — URINALYSIS, COMPLETE (UACMP) WITH MICROSCOPIC
Bilirubin Urine: NEGATIVE
Glucose, UA: 50 mg/dL — AB
Ketones, ur: 5 mg/dL — AB
Nitrite: NEGATIVE
Protein, ur: 100 mg/dL — AB
Specific Gravity, Urine: 1.03 (ref 1.005–1.030)
pH: 5 (ref 5.0–8.0)

## 2020-07-30 LAB — LACTIC ACID, PLASMA
Lactic Acid, Venous: 3.5 mmol/L (ref 0.5–1.9)
Lactic Acid, Venous: 1.4 mmol/L (ref 0.5–1.9)

## 2020-07-30 LAB — TROPONIN I (HIGH SENSITIVITY)
Troponin I (High Sensitivity): 30 ng/L — ABNORMAL HIGH (ref <18)
Troponin I (High Sensitivity): 26 ng/L — ABNORMAL HIGH (ref <18)

## 2020-07-30 LAB — PROCALCITONIN: Procalcitonin: 1.32 ng/mL

## 2020-07-30 LAB — RESP PANEL BY RT-PCR (FLU A&B, COVID) ARPGX2
Influenza A by PCR: NEGATIVE
Influenza B by PCR: NEGATIVE
SARS Coronavirus 2 by RT PCR: NEGATIVE

## 2020-07-30 MED ORDER — FUROSEMIDE 10 MG/ML IJ SOLN
40.0000 mg | Freq: Once | INTRAMUSCULAR | Status: AC
  Administered 2020-07-30: 40 mg via INTRAVENOUS
  Filled 2020-07-30: qty 4

## 2020-07-30 MED ORDER — DILTIAZEM HCL ER COATED BEADS 180 MG PO CP24: 180.0000 mg | ORAL_CAPSULE | ORAL | Status: DC

## 2020-07-30 MED ORDER — SODIUM CHLORIDE 0.9 % IV SOLN: 1.0000 g | INTRAVENOUS | Status: DC

## 2020-07-30 MED ORDER — FLUOXETINE HCL 20 MG PO CAPS
40.0000 mg | ORAL_CAPSULE | Freq: Every day | ORAL | Status: DC
  Administered 2020-07-31 – 2020-08-03 (×4): 40 mg via ORAL
  Filled 2020-07-30 (×6): qty 2

## 2020-07-30 MED ORDER — DONEPEZIL HCL 5 MG PO TABS
5.0000 mg | ORAL_TABLET | Freq: Every day | ORAL | Status: DC
  Administered 2020-07-30 – 2020-08-02 (×4): 5 mg via ORAL
  Filled 2020-07-30 (×4): qty 1

## 2020-07-30 MED ORDER — ONDANSETRON HCL 4 MG PO TABS: 4.0000 mg | ORAL_TABLET | Freq: Four times a day (QID) | ORAL | Status: DC | PRN

## 2020-07-30 MED ORDER — PANTOPRAZOLE SODIUM 40 MG PO TBEC
40.0000 mg | DELAYED_RELEASE_TABLET | Freq: Every day | ORAL | Status: DC
  Administered 2020-07-31 – 2020-08-03 (×4): 40 mg via ORAL
  Filled 2020-07-30 (×4): qty 1

## 2020-07-30 MED ORDER — LEVOFLOXACIN IN D5W 750 MG/150ML IV SOLN
750.0000 mg | Freq: Once | INTRAVENOUS | Status: AC
  Administered 2020-07-30: 750 mg via INTRAVENOUS
  Filled 2020-07-30: qty 150

## 2020-07-30 MED ORDER — HYDROXYCHLOROQUINE SULFATE 200 MG PO TABS
200.0000 mg | ORAL_TABLET | Freq: Two times a day (BID) | ORAL | Status: DC
  Administered 2020-07-30 – 2020-08-03 (×8): 200 mg via ORAL
  Filled 2020-07-30 (×9): qty 1

## 2020-07-30 MED ORDER — VITAMIN B-12 1000 MCG PO TABS
1000.0000 ug | ORAL_TABLET | Freq: Every day | ORAL | Status: DC
  Administered 2020-07-31 – 2020-08-03 (×4): 1000 ug via ORAL
  Filled 2020-07-30 (×4): qty 1

## 2020-07-30 MED ORDER — CLONAZEPAM 0.5 MG PO TABS: 1.0000 mg | ORAL_TABLET | Freq: Every evening | ORAL | Status: DC | PRN

## 2020-07-30 MED ORDER — TAMSULOSIN HCL 0.4 MG PO CAPS
0.4000 mg | ORAL_CAPSULE | Freq: Every day | ORAL | Status: DC
  Administered 2020-07-31 – 2020-08-03 (×4): 0.4 mg via ORAL
  Filled 2020-07-30 (×4): qty 1

## 2020-07-30 MED ORDER — ASPIRIN EC 81 MG PO TBEC
81.0000 mg | DELAYED_RELEASE_TABLET | Freq: Every day | ORAL | Status: DC
  Administered 2020-07-30 – 2020-08-03 (×5): 81 mg via ORAL
  Filled 2020-07-30 (×4): qty 1

## 2020-07-30 MED ORDER — ZOLPIDEM TARTRATE 5 MG PO TABS
5.0000 mg | ORAL_TABLET | Freq: Every day | ORAL | Status: DC
  Administered 2020-07-30 – 2020-08-02 (×4): 5 mg via ORAL
  Filled 2020-07-30 (×4): qty 1

## 2020-07-30 MED ORDER — ATORVASTATIN CALCIUM 80 MG PO TABS
80.0000 mg | ORAL_TABLET | ORAL | Status: DC
  Administered 2020-07-31 – 2020-08-02 (×3): 80 mg via ORAL
  Filled 2020-07-30 (×2): qty 1
  Filled 2020-07-30: qty 4
  Filled 2020-07-30 (×2): qty 1
  Filled 2020-07-30: qty 4

## 2020-07-30 MED ORDER — METRONIDAZOLE 500 MG/100ML IV SOLN
500.0000 mg | Freq: Three times a day (TID) | INTRAVENOUS | Status: DC
  Administered 2020-07-30 – 2020-08-03 (×13): 500 mg via INTRAVENOUS
  Filled 2020-07-30 (×14): qty 100

## 2020-07-30 MED ORDER — SODIUM CHLORIDE 0.9 % IV SOLN
2.0000 g | INTRAVENOUS | Status: DC
  Administered 2020-07-31 – 2020-08-03 (×4): 2 g via INTRAVENOUS
  Filled 2020-07-30: qty 20
  Filled 2020-07-30 (×2): qty 2
  Filled 2020-07-30: qty 20
  Filled 2020-07-30: qty 2

## 2020-07-30 MED ORDER — LEVOTHYROXINE SODIUM 25 MCG PO TABS
25.0000 ug | ORAL_TABLET | Freq: Every day | ORAL | Status: DC
  Administered 2020-07-31 – 2020-08-03 (×4): 25 ug via ORAL
  Filled 2020-07-30 (×5): qty 1

## 2020-07-30 MED ORDER — ACETAMINOPHEN 650 MG RE SUPP: 650.0000 mg | Freq: Four times a day (QID) | RECTAL | Status: AC | PRN

## 2020-07-30 MED ORDER — ALLOPURINOL 100 MG PO TABS
100.0000 mg | ORAL_TABLET | ORAL | Status: DC
  Administered 2020-07-31 – 2020-08-03 (×4): 100 mg via ORAL
  Filled 2020-07-30 (×5): qty 1

## 2020-07-30 MED ORDER — ONDANSETRON HCL 4 MG/2ML IJ SOLN: 4.0000 mg | Freq: Four times a day (QID) | INTRAMUSCULAR | Status: DC | PRN

## 2020-07-30 MED ORDER — HYDROXYCHLOROQUINE SULFATE 200 MG PO TABS: 200.0000 mg | ORAL_TABLET | Freq: Two times a day (BID) | ORAL | Status: DC

## 2020-07-30 MED ORDER — LAMOTRIGINE 25 MG PO TABS
75.0000 mg | ORAL_TABLET | Freq: Two times a day (BID) | ORAL | Status: DC
  Administered 2020-07-30 – 2020-08-03 (×8): 75 mg via ORAL
  Filled 2020-07-30 (×9): qty 3

## 2020-07-30 MED ORDER — ENOXAPARIN SODIUM 40 MG/0.4ML IJ SOSY
40.0000 mg | PREFILLED_SYRINGE | INTRAMUSCULAR | Status: DC
  Administered 2020-07-30 – 2020-08-02 (×4): 40 mg via SUBCUTANEOUS
  Filled 2020-07-30 (×4): qty 0.4

## 2020-07-30 MED ORDER — LACTATED RINGERS IV BOLUS
1000.0000 mL | Freq: Once | INTRAVENOUS | Status: AC
  Administered 2020-07-30: 1000 mL via INTRAVENOUS

## 2020-07-30 MED ORDER — ACETAMINOPHEN 325 MG PO TABS: 650.0000 mg | ORAL_TABLET | Freq: Four times a day (QID) | ORAL | Status: AC | PRN

## 2020-07-30 MED ORDER — HYDROCODONE-ACETAMINOPHEN 5-325 MG PO TABS
1.0000 | ORAL_TABLET | Freq: Four times a day (QID) | ORAL | Status: DC | PRN
  Administered 2020-07-30: 1 via ORAL
  Filled 2020-07-30: qty 1

## 2020-07-30 NOTE — ED Notes (Signed)
Pt covered in dry stool. Pt cleaned up and given bed bath with RN Georgina Peer. Chux and brief placed at this time. External cath placed. Pt placed in gown and pulled up in bed. Pt given blanket.

## 2020-07-30 NOTE — ED Notes (Signed)
Meal tray given 

## 2020-07-30 NOTE — ED Provider Notes (Signed)
[facility] Emergency Department Provider Note   ____________________________________________   Event Date/Time   First MD Initiated Contact with Patient 07/30/20 [phone]     (approximate)  I have reviewed the triage vital signs and the nursing notes.   HISTORY  Chief Complaint Weakness and Diarrhea    HPI Tonya Walton is a 70 y.o. female with past medical history of hypertension, COPD, CHF, atrial fibrillation, CAD status post CABG, Sjogren's disease, and seizures who presents to the ED for generalized weakness.  Patient reports that she has been feeling increasingly weak over the past couple of days, especially in her legs.  She states that when she attempts to walk her legs start to give out on her and she falls to the ground.  She has gotten to the point that she has been ill and able to walk at all for the past 24 hours.  She denies falling or hitting her head.  She denies any fevers, cough, chest pain, shortness of breath, abdominal pain, vomiting, or dysuria.  She does state that she has been unable to control her bowel movements or her urine, has been frequently urinating and defecating on herself for the past 2 days.  Per EMS, patient was found covered in stool.  She states that she typically is able to walk with minimal difficulty, does not use a walker at baseline.        Past Medical History:  Diagnosis Date   Anxiety    Arthritis    Atrial fibrillation (HCC)    CHF (congestive heart failure) (HCC)    COPD (chronic obstructive pulmonary disease) (HCC)    Coronary artery disease    Depression    Family history of adverse reaction to anesthesia    brothers had a lung collaspe during an unknown procedure.   GERD (gastroesophageal reflux disease)    Headache    History of kidney stones    Hx of CABG 04/01/2015   Single vessel; LIMA-LAD   Hypertension    Hypothyroidism    Insomnia    Invasive ductal carcinoma of breast, female, right ([place])  09/27/2010   Stage 1A; ER/PR (+) and HER2/neu indeterminant; pT1b pN0 (sn)   Neuromuscular disorder (HCC)    nerve damage in legs   Personal history of radiation therapy 2012   BREAST CA   Restless leg syndrome    Seizures (HCC)    Shortness of breath dyspnea    Sjogren's syndrome (HCC)    Stroke (HCC)    Some mild weakness in left side residual    Patient Active Problem List   Diagnosis Date Noted   Coronary artery disease with history of myocardial infarction without history of CABG 07/30/2020   Gastroenteritis 07/30/2020   Peripheral neuropathy, idiopathic 07/11/2017   Seizure disorder ([place]) 06/12/2017   Syncope 05/25/2017   Medicare annual wellness visit, initial 02/24/2016   Chest pain 04/01/2015   CAD in native artery 03/10/2015   Unstable angina pectoris ([place]) 02/23/2015   Benign essential HTN 10/20/2014   Chronic anxiety 10/20/2014   Anxiety, generalized 10/20/2014   Insomnia, persistent 10/20/2014   Recurrent major depressive episodes ([place]) 10/20/2014   Depression, major, single episode, severe ([place]) 10/20/2014   Combined fat and carbohydrate induced hyperlipemia 10/20/2014   Restless leg syndrome 10/20/2014   MDD (major depressive disorder), recurrent episode, moderate ([place]) 10/20/2014   AF (paroxysmal atrial fibrillation) ([place]) 07/31/2014   Paroxysmal atrial fibrillation ([place]) 07/31/2014   H/O total knee replacement  06/25/2014   Total knee replacement status 06/10/2014   H/O malignant neoplasm of breast 05/24/2014   Restless leg 05/24/2014   H/O: HTN (hypertension) 05/24/2014   History of atrial fibrillation 05/24/2014   CAFL (chronic airflow limitation) ([place]) 05/24/2014   Vitamin B12 deficiency 05/24/2014   H/O disease 05/24/2014   H/O: hypothyroidism 05/24/2014   H/O gastrointestinal disease 05/24/2014   Addison anemia 02/16/2014   Anxiety 11/06/2013   A-fib ([place]) 11/06/2013   Clinical depression 11/06/2013   Acid reflux 11/06/2013   Heart valve  disease 11/06/2013   Primary cancer of right female breast ([place]) 11/06/2013   HLD (hyperlipidemia) 11/06/2013   BP (high blood pressure) 11/06/2013   Atrial fibrillation ([place]) 11/06/2013   Chronic obstructive pulmonary disease ([place]) 11/06/2013   Hormone receptor positive malignant neoplasm of breast ([place]) 11/06/2013   Adult hypothyroidism 06/29/2010   Adult BMI 30+ 06/29/2010   B-complex deficiency 12/05/1995   Extremity pain 12/05/1995    Past Surgical History:  Procedure Laterality Date   APPENDECTOMY     BREAST BIOPSY Left 07/2013   NEG   BREAST BIOPSY Right 08/22/2016   benign   BREAST BIOPSY Right 01/07/2019   asymmetry bx with stereo, coil marker,fat necrosis   BREAST BIOPSY Right 01/07/2019   asymm affirm bx,  x marker,  fat necrosis   BREAST BIOPSY Right 03/25/2020   stereo bx, ribbon clip, path pending    BREAST EXCISIONAL BIOPSY Right 2012   POS   BREAST LUMPECTOMY  2012   Right breast   CARDIAC CATHETERIZATION N/A 03/02/2015   Procedure: Left Heart Cath and Coronary Angiography;  Surgeon: Teodoro Spray, MD;  Location: [place] [facility];  Service: Cardiovascular;  Laterality: N/A;   CATARACT EXTRACTION W/PHACO Right 10/14/2014   Procedure: CATARACT EXTRACTION PHACO AND INTRAOCULAR LENS PLACEMENT (IOC);  Surgeon: Leandrew Koyanagi, MD;  Location: [facility];  Service: Ophthalmology;  Laterality: Right;   COLONOSCOPY     CORONARY ARTERY BYPASS GRAFT N/A 04/01/2015   Procedure: CORONARY ARTERY BYPASS GRAFTING (CABG) x one , using left mammary artery;  Surgeon: Ivin Poot, MD;  Location: [facility];  Service: Open Heart Surgery;  Laterality: N/A;   CYSTOSCOPY/URETEROSCOPY/HOLMIUM LASER/STENT PLACEMENT Left 07/06/2020   Procedure: CYSTOSCOPY/URETEROSCOPY/STENT PLACEMENT/ retrograde pyelogram;  Surgeon: Abbie Sons, MD;  Location: [facility];  Service: Urology;  Laterality: Left;   CYSTOSCOPY/URETEROSCOPY/HOLMIUM LASER/STENT PLACEMENT Left 07/27/2020   Procedure:  CYSTOSCOPY/URETEROSCOPY/HOLMIUM LASER/STENT EXCHANGE;  Surgeon: Abbie Sons, MD;  Location: [facility];  Service: Urology;  Laterality: Left;   JOINT REPLACEMENT Right    Total Knee Replacement   TEE WITHOUT CARDIOVERSION N/A 04/01/2015   Procedure: TRANSESOPHAGEAL ECHOCARDIOGRAM (TEE);  Surgeon: Ivin Poot, MD;  Location: [facility];  Service: Open Heart Surgery;  Laterality: N/A;   TONSILLECTOMY     TOTAL KNEE ARTHROPLASTY Right 06/10/2014   Procedure: TOTAL KNEE ARTHROPLASTY;  Surgeon: Dereck Leep, MD;  Location: [facility];  Service: Orthopedics;  Laterality: Right;   TOTAL KNEE ARTHROPLASTY Left 09/02/2014   Procedure: TOTAL KNEE ARTHROPLASTY;  Surgeon: Dereck Leep, MD;  Location: [facility];  Service: Orthopedics;  Laterality: Left;    Prior to Admission medications   Medication Sig Start Date End Date Taking? Authorizing Provider  allopurinol (ZYLOPRIM) 100 MG tablet Take 100 mg by mouth every morning. 05/27/20   [provider]  aspirin EC 81 MG tablet Take 81 mg by mouth daily. 07/23/08   [provider]  atorvastatin (LIPITOR) 80  MG tablet Take 80 mg by mouth every morning. 06/13/20   [provider]  Carboxymethylcellul-Glycerin (LUBRICATING EYE DROPS OP) Place 1 drop into both eyes 2 (two) times daily.    [provider]  Cholecalciferol (VITAMIN D3) 2000 units capsule Take 2,000 Units by mouth daily.     [provider]  clonazePAM (KLONOPIN) 1 MG tablet Take 1 mg by mouth at bedtime. 04/12/20   [provider]  colchicine 0.6 MG tablet Take 0.6 mg by mouth 2 (two) times daily as needed (gout). 01/02/20   [provider]  cyclobenzaprine (FLEXERIL) 10 MG tablet Take 10 mg by mouth every morning.    [provider]  diltiazem (CARDIZEM CD) 180 MG 24 hr capsule Take 180 mg by mouth every morning. 01/12/20   [provider]  donepezil (ARICEPT) 5 MG tablet Take 5 mg by mouth at bedtime. 05/30/20   [provider]  HYDROcodone-acetaminophen (NORCO/VICODIN) 5-325 MG tablet Take 1 tablet by mouth every 6 (six) hours as needed for moderate pain. 07/06/20   Stoioff, Ronda Fairly, MD  hydroxychloroquine (PLAQUENIL) 200 MG tablet Take 200 mg by mouth 2 (two) times daily. 05/31/20   [provider]  lamoTRIgine (LAMICTAL) 25 MG tablet TAKE 2 TABLETS BY MOUTH EVERY DAY Patient taking differently: Take 75 mg by mouth 2 (two) times daily. 07/22/18   Rainey Pines, MD  levothyroxine (SYNTHROID, LEVOTHROID) 25 MCG tablet Take 25 mcg by mouth daily before breakfast.    [provider]  meloxicam (MOBIC) 15 MG tablet Take 15 mg by mouth daily. 07/15/15   [provider]  montelukast (SINGULAIR) 10 MG tablet Take 10 mg by mouth at bedtime.    [provider]  pantoprazole (PROTONIX) 40 MG tablet Take 40 mg by mouth as needed. 04/30/20   [provider]  Riboflavin (VITAMIN B-2 PO) Take 400 mg by mouth daily.    [provider]  sucralfate (CARAFATE) 1 g tablet Take 1 g by mouth 4 (four) times daily as needed (heartburn).    [provider]  tamsulosin (FLOMAX) 0.4 MG CAPS capsule Take 1 capsule (0.4 mg total) by mouth daily after breakfast. 07/15/20   Stoioff, Ronda Fairly, MD  Thiamine HCl (B-1) 500 MG TABS Take 500 mg by mouth daily.    [provider]  Trospium Chloride 60 MG CP24 Take 60 mg by mouth every morning. 04/14/20   [provider]  vitamin B-12 (CYANOCOBALAMIN) 1000 MCG tablet Take 1,000 mcg by mouth daily.    [provider]  zolpidem (AMBIEN) 5 MG tablet Take 5 mg by mouth at bedtime. 02/02/17   [provider]    Allergies Patient has no known allergies.  Family History  Problem Relation Age of Onset   Leukemia Mother    Bone cancer Father    Heart disease Brother    Heart disease Brother    Diabetes Brother    Hypertension Brother    Diabetes Brother    Hypertension Brother    Heart disease Brother     Breast cancer Paternal Aunt     Social History Social History   Tobacco Use   Smoking status: Former    Packs/day: 1.00    Pack years: 0.00    Types: Cigarettes    Quit date: 04/30/1992    Years since quitting: 28.2   Smokeless tobacco: Never  Vaping Use   Vaping Use: Never used  Substance Use Topics   Alcohol use:  No   Drug use: No    Review of Systems  Constitutional: No fever/chills.  Positive for generalized weakness. Eyes: No visual changes. ENT: No sore throat. Cardiovascular: Denies chest pain. Respiratory: Denies shortness of breath. Gastrointestinal: No abdominal pain.  No nausea, no vomiting.  Positive for bowel incontinence and diarrhea.  No constipation.  Negative for bloody stool. Genitourinary: Negative for dysuria.  Positive for urinary incontinence. Musculoskeletal: Negative for back pain. Skin: Negative for rash. Neurological: Negative for headaches, focal weakness or numbness.  ____________________________________________   PHYSICAL EXAM:  VITAL SIGNS: ED Triage Vitals  Enc Vitals Group     BP      Pulse      Resp      Temp      Temp src      SpO2      Weight      Height      Head Circumference      Peak Flow      Pain Score      Pain Loc      Pain Edu?      Excl. in [place]?     Constitutional: Alert and oriented. Eyes: Conjunctivae are normal. Head: Atraumatic. Nose: No congestion/rhinnorhea. Mouth/Throat: Mucous membranes are moist. Neck: Normal ROM Cardiovascular: Normal rate, regular rhythm. Grossly normal heart sounds.  2+ radial pulses bilaterally. Respiratory: Normal respiratory effort.  No retractions. Lungs CTAB. Gastrointestinal: Soft and nontender. No distention. Genitourinary: deferred Musculoskeletal: No lower extremity tenderness nor edema. Neurologic:  Normal speech and language.  Generalized weakness with no gross focal neurologic deficits appreciated. Skin:  Skin is warm, dry and intact. No rash noted. Psychiatric:  Mood and affect are normal. Speech and behavior are normal.  ____________________________________________   LABS (all labs ordered are listed, but only abnormal results are displayed)  Labs Reviewed  CBC WITH DIFFERENTIAL/PLATELET - Abnormal; Notable for the following components:      Result Value   WBC 19.2 (*)    RBC 3.61 (*)    Hemoglobin 10.9 (*)    HCT 33.8 (*)    Neutro Abs 17.1 (*)    Lymphs Abs 0.6 (*)    Monocytes Absolute 1.3 (*)    Abs Immature Granulocytes 0.18 (*)    All other components within normal limits  COMPREHENSIVE METABOLIC PANEL - Abnormal; Notable for the following components:   Sodium 133 (*)    Glucose, Bld 172 (*)    Creatinine, Ser 1.30 (*)    Calcium 8.0 (*)    Albumin 3.2 (*)    GFR, Estimated 44 (*)    All other components within normal limits  URINALYSIS, COMPLETE (UACMP) WITH MICROSCOPIC - Abnormal; Notable for the following components:   Color, Urine AMBER (*)    APPearance CLOUDY (*)    Glucose, UA 50 (*)    Hgb urine dipstick SMALL (*)    Ketones, ur 5 (*)    Protein, ur 100 (*)    Leukocytes,Ua MODERATE (*)    Bacteria, UA FEW (*)    All other components within normal limits  LACTIC ACID, PLASMA - Abnormal; Notable for the following components:   Lactic Acid, Venous 3.5 (*)    All other components within normal limits  TROPONIN I (HIGH SENSITIVITY) - Abnormal; Notable for the following components:   Troponin I (High Sensitivity) 30 (*)    All other components within normal limits  RESP PANEL BY RT-PCR (FLU A&B, COVID) ARPGX2  GASTROINTESTINAL PANEL BY PCR, STOOL (  REPLACES STOOL CULTURE)  C DIFFICILE QUICK SCREEN W PCR REFLEX    CULTURE, BLOOD (ROUTINE X 2)  CULTURE, BLOOD (ROUTINE X 2)  URINE CULTURE  LACTIC ACID, PLASMA  TROPONIN I (HIGH SENSITIVITY)   ____________________________________________   PROCEDURES  Procedure(s) performed (including Critical Care):  .Critical Care  Date/Time: 07/30/2020 12:44 PM Performed by:  Blake Divine, MD Authorized by: Blake Divine, MD   Critical care provider statement:    Critical care time (minutes):  45   Critical care time was exclusive of:  Separately billable procedures and treating other patients and teaching time   Critical care was necessary to treat or prevent imminent or life-threatening deterioration of the following conditions:  Respiratory failure   Critical care was time spent personally by me on the following activities:  Discussions with consultants, evaluation of patient's response to treatment, examination of patient, ordering and performing treatments and interventions, ordering and review of laboratory studies, ordering and review of radiographic studies, pulse oximetry, re-evaluation of patient's condition, obtaining history from patient or surrogate and review of old charts   I assumed direction of critical care for this patient from another provider in my specialty: no     Care discussed with: admitting provider     ____________________________________________   INITIAL IMPRESSION / ASSESSMENT AND PLAN / ED COURSE      70 year old female with past medical history of hypertension, COPD, CAD status post CABG, CHF, atrial fibrillation, seizures, and Sjogren's disease who presents to the ED with increasing generalized weakness for the past 2 days with bowel and bladder incontinence.  Patient appears generally weak on exam with no focal neurologic deficits.  She does have a history of cerebellar stroke noted on prior brain imaging and we will check CT head for any evidence of recurrent stroke to explain her balance difficulties.  She is outside the window for any acute intervention on potential stroke.  She has no lower back pain or saddle anesthesia to suggest cauda equina or other spinal pathology.  We will check labs for anemia or electrolyte abnormality.  UA, chest x-ray, and stool studies are pending for potential infectious etiology.  We will  hydrate with IV fluids and reassess pending results.  Chest x-ray reviewed by me and is concerning for mild pulmonary edema, patient also noted to have drop in O2 sats to 87% on room air, subsequently improved with 2 L nasal cannula.  IV fluids were stopped and we will give dose of IV Lasix.  Additional work-up was remarkable for leukocytosis and signs of UTI.  Urine was sent for culture, recent previous culture shows Enterococcus sensitive to Levaquin and we will give dose of IV Levaquin.  Case discussed with hospitalist for admission.      ____________________________________________   FINAL CLINICAL IMPRESSION(S) / ED DIAGNOSES  Final diagnoses:  Generalized weakness  Acute pulmonary edema ([place])  Acute cystitis without hematuria     ED Discharge Orders     None        Note:  This document was prepared using Dragon voice recognition software and may include unintentional dictation errors.    Blake Divine, MD 07/30/20 [phone]

## 2020-07-30 NOTE — ED Triage Notes (Signed)
Pt comes via EMS from home with c/o diarrhea since Wednesday. Pt also states multiple falls and weakness.  Pt denies any belly pain, N/V/  BP-126/53 HR-74 O2-98 % RA Temp-98.8  Pt covered in stool and warm to touch. Pt states loss of urinary control.

## 2020-07-30 NOTE — ED Triage Notes (Signed)
Pt comes into the ED via [facility] from home c/o diarrhea since Wednesday. 126/53, 74 HR NSR, 98.8 oral, 98 % RA.  Pt denies any abdominal pain or nausea.  Pt in NAD at this time with even and unlabored respirations.  H/o COPD and bypass surgery.

## 2020-07-30 NOTE — ED Notes (Signed)
Pt taken to CT.

## 2020-07-30 NOTE — H&P (Signed)
History and Physical   SKYLIN Walton VOZ:[id number] DOB: 10-11-1950 DOA: 07/30/2020  PCP: Rusty Aus, MD  Outpatient Specialists: Dr. Bernardo Heater Patient coming from: Home via EMS  I have personally briefly reviewed patient's old medical records in [address].  Chief Concern: Diarrhea and weakness  HPI: Tonya Walton is a 70 y.o. female with medical history significant for hypothyroid, hypertension, hyperlipidemia, obesity, CAD status post one-vessel CABG in 2017, major depressive disorder, history of seizures, paroxysmal atrial fibrillation, presents to the emergency department from home for chief concerns of diarrhea and profound weakness.  Patient states that she has not been able to even stand up to go use the restroom due to profound weakness.  She reports that the diarrhea started Wednesday, 07/28/20, loose stool, of and on all day. She denies changes to diet, and anyone she knows that has been sick.  She denies her pets being sick recently.  She denies chest pain, shortness of breath, vomiting. She endorses baseline swelling of her lower extremities. She endorses nausea.   She endorses dysuria, that has been persistent since November 2021. She had stents placed on 07/06/20 and still has dysuria. She has been on antibioties for 'kidney' infection from November 2021 to May 2022. Her Urologist recently took her off of the antibiotics.   She denies poor appetite.  She endorses good p.o. intake including fluids.  Social history: She lives with her husband and 7 cats. She denies tobacco use, etoh, and recreational drug use. She is retired and formerly worked in [employer] (medical).  Vaccination history: She has had 3 doses of Pfizer vaccine for covid 19.  ROS: Constitutional: no weight change, + fever (99 on Wednesday at home)  ENT/Mouth: no sore throat, no rhinorrhea Eyes: no eye pain, no vision changes Cardiovascular: no chest pain, no dyspnea,  no edema, no palpitations Respiratory: no  cough, no sputum, no wheezing Gastrointestinal: no nausea, no vomiting, + diarrhea, no constipation Genitourinary: no urinary incontinence, no dysuria, no hematuria Musculoskeletal: no arthralgias, no myalgias Skin: no skin lesions, no pruritus, Neuro: ++ weakness, no loss of consciousness, no syncope Psych: no anxiety, no depression, + decrease appetite Heme/Lymph: no bruising, no bleeding  ED Course: Discussed with emergency medicine provider, patient requiring hospitalization for diarrhea and profound weakness and meeting sepsis criteria.  Vitals in the emergency department was remarkable for temperature 100, respiration rate of 17, heart rate 93, blood pressure 124/36, SPO2 of 94% on room air.  Labs was remarkable for WBC 19.2, hemoglobin 10.9, platelets 179, sodium 133, potassium 3.5, chloride 100, bicarb 22, BUN 20, serum creatinine 1.30, nonfasting blood glucose 172.  In the emergency department patient received 1 L lactated ringer bolus.  Per ED provider patient then developed some symptoms of shortness of breath and received nasal cannula.    ED provider ordered portable chest x-ray which was read as increased interstitial opacities seen throughout both lungs favored to pulmonary edema.  Small left pleural effusion present.    ED provider then ordered Lasix 40 mg IV.  Levofloxacin 750 mg IV was given by EDP.  Assessment/Plan  Principal Problem:   Gastroenteritis Active Problems:   Anxiety   Benign essential HTN   Anxiety, generalized   Acid reflux   HLD (hyperlipidemia)   Adult hypothyroidism   Depression, major, single episode, severe (HCC)   AF (paroxysmal atrial fibrillation) (HCC)   Restless leg syndrome   MDD (major depressive disorder), recurrent episode, moderate (HCC)   Chronic obstructive pulmonary disease (  [place])   Seizure disorder ([place])   Coronary artery disease with history of myocardial infarction without history of CABG   Neutrophilic leukocytosis   #  Patient meeting sepsis criteria # History of recurrent urinary tract infection - Query severe sepsis with renal involvement - Recent urologic procedures in June, 6/7 and 07/27/2020 - Elevated heart rate, elevated lactic of 3.5, WBC 19.2 - Etiology work-up in progress at this time, possible source is urine - Blood cultures x2, collected and are in-process - Patient already received 750 mg of levofloxacin - Patient maintaining appropriate MAP at this time and given that chest x-ray shows volume overload and small pleural effusion and patient development of shortness of breath with lactated Ringer bolus, I will hold bolus and IVF at this time - UA was positive for moderate leukocytes, cloudy, small hemoglobin, 5 ketones - Procalcitonin ordered to assess for baseline - Given profound leukocytosis and mild acute kidney injury, in setting of diarrhea, with history of long-term antibiotic use, C. difficile colitis cannot be excluded at this time - C. difficile PCR quick screen ordered, GI panel ordered, discussed with patient that if she feels the need to have a bowel movement, patient to let nursing know - Discussed with nursing at patient's bedside to have bedpan available for patient for stool collection  # Mild acute kidney injury-I suspect this is prerenal however given that patient developed mild shortness of breath with fluid bolus, I am encouraging patient's good p.o. intake at this time - Patient is status post 1 L lactated ringer bolus - BMP in the a.m.  # Diarrhea-query gastroenteritis however C. difficile colitis and other infectious diarrhea cannot be excluded at this time, see above for treatment -Metronidazole 500 mg IV every 8 hours -Ceftriaxone 1 g IV first dose starting 07/31/2020  # Paroxysmal atrial fibrillation-patient follows with Dr. Ubaldo Glassing for cardiology - Patient is not on anticoagulation, per cardiology note on 08/25/2015, patient is not on anticoagulation and only on aspirin and  metoprolol alone as patient is currently in sinus rhythm - Patient is on metoprolol 25 mg tablet, half tablet twice daily - Aspirin resumed - Continue outpatient follow-up with cardiology  # History of gout-currently in remission - Resumed allopurinol 100 mg in the morning - Did not resume colchicine due to diarrhea  # Insomnia-resumed home Ambien 5 mg nightly  # Hyperlipidemia-atorvastatin 40 mg nightly  # Major depressive disorder-appears to be in remission at this time, resumed home fluoxetine 40 mg daily  # Hypothyroid-levothyroxine 25 mcg daily before breakfast  # History of recurrent syncopal episodes in the remote past  # History history of seizures-resumed home lamotrigine 75 mg twice daily  # GERD-PPI  COVID PCR/influenza A PCR/influenza B PCR were negative  Chart reviewed.   07/06/2020: Patient had cystoscopy and left ureteroscopy, left retrograde pyelography with interpretation, with left stent placement  07/27/2020: Patient had cystoscopy/left ureteroscopy and stone removal/ureteroscopic laser lithotripsy, holmium laser/left urethral stent exchange/left retrograde pyelography with interpretation  DVT prophylaxis: Enoxaparin Code Status: Full code Diet: Heart healthy Family Communication: None at this time Disposition Plan: Pending clinical course Consults called: None at this time Admission status: Observation, MedSurg, telemetry not indicated at this time  Past Medical History:  Diagnosis Date   Anxiety    Arthritis    Atrial fibrillation (HCC)    CHF (congestive heart failure) ([place])    COPD (chronic obstructive pulmonary disease) ([place])    Coronary artery disease    Depression    Family  history of adverse reaction to anesthesia    brothers had a lung collaspe during an unknown procedure.   GERD (gastroesophageal reflux disease)    Headache    History of kidney stones    Hx of CABG 04/01/2015   Single vessel; LIMA-LAD   Hypertension    Hypothyroidism     Insomnia    Invasive ductal carcinoma of breast, female, right ([place]) 09/27/2010   Stage 1A; ER/PR (+) and HER2/neu indeterminant; pT1b pN0 (sn)   Neuromuscular disorder (HCC)    nerve damage in legs   Personal history of radiation therapy 2012   BREAST CA   Restless leg syndrome    Seizures (HCC)    Shortness of breath dyspnea    Sjogren's syndrome ([place])    Stroke (HCC)    Some mild weakness in left side residual   Past Surgical History:  Procedure Laterality Date   APPENDECTOMY     BREAST BIOPSY Left 07/2013   NEG   BREAST BIOPSY Right 08/22/2016   benign   BREAST BIOPSY Right 01/07/2019   asymmetry bx with stereo, coil marker,fat necrosis   BREAST BIOPSY Right 01/07/2019   asymm affirm bx,  x marker,  fat necrosis   BREAST BIOPSY Right 03/25/2020   stereo bx, ribbon clip, path pending    BREAST EXCISIONAL BIOPSY Right 2012   POS   BREAST LUMPECTOMY  2012   Right breast   CARDIAC CATHETERIZATION N/A 03/02/2015   Procedure: Left Heart Cath and Coronary Angiography;  Surgeon: Teodoro Spray, MD;  Location: [facility];  Service: Cardiovascular;  Laterality: N/A;   CATARACT EXTRACTION W/PHACO Right 10/14/2014   Procedure: CATARACT EXTRACTION PHACO AND INTRAOCULAR LENS PLACEMENT (IOC);  Surgeon: Leandrew Koyanagi, MD;  Location: [facility];  Service: Ophthalmology;  Laterality: Right;   COLONOSCOPY     CORONARY ARTERY BYPASS GRAFT N/A 04/01/2015   Procedure: CORONARY ARTERY BYPASS GRAFTING (CABG) x one , using left mammary artery;  Surgeon: Ivin Poot, MD;  Location: [facility];  Service: Open Heart Surgery;  Laterality: N/A;   CYSTOSCOPY/URETEROSCOPY/HOLMIUM LASER/STENT PLACEMENT Left 07/06/2020   Procedure: CYSTOSCOPY/URETEROSCOPY/STENT PLACEMENT/ retrograde pyelogram;  Surgeon: Abbie Sons, MD;  Location: [facility];  Service: Urology;  Laterality: Left;   CYSTOSCOPY/URETEROSCOPY/HOLMIUM LASER/STENT PLACEMENT Left 07/27/2020   Procedure:  CYSTOSCOPY/URETEROSCOPY/HOLMIUM LASER/STENT EXCHANGE;  Surgeon: Abbie Sons, MD;  Location: [facility];  Service: Urology;  Laterality: Left;   JOINT REPLACEMENT Right    Total Knee Replacement   TEE WITHOUT CARDIOVERSION N/A 04/01/2015   Procedure: TRANSESOPHAGEAL ECHOCARDIOGRAM (TEE);  Surgeon: Ivin Poot, MD;  Location: [facility];  Service: Open Heart Surgery;  Laterality: N/A;   TONSILLECTOMY     TOTAL KNEE ARTHROPLASTY Right 06/10/2014   Procedure: TOTAL KNEE ARTHROPLASTY;  Surgeon: Dereck Leep, MD;  Location: [facility];  Service: Orthopedics;  Laterality: Right;   TOTAL KNEE ARTHROPLASTY Left 09/02/2014   Procedure: TOTAL KNEE ARTHROPLASTY;  Surgeon: Dereck Leep, MD;  Location: [facility];  Service: Orthopedics;  Laterality: Left;   Social History:  reports that she quit smoking about 28 years ago. Her smoking use included cigarettes. She smoked an average of 1.00 packs per day. She has never used smokeless tobacco. She reports that she does not drink alcohol and does not use drugs.  No Known Allergies Family History  Problem Relation Age of Onset   Leukemia Mother    Bone cancer Father    Heart disease Brother  Heart disease Brother    Diabetes Brother    Hypertension Brother    Diabetes Brother    Hypertension Brother    Heart disease Brother    Breast cancer Paternal Aunt    Family history: Family history reviewed and not pertinent  Prior to Admission medications   Medication Sig Start Date End Date Taking? Authorizing Provider  allopurinol (ZYLOPRIM) 100 MG tablet Take 100 mg by mouth every morning. 05/27/20   [provider]  aspirin EC 81 MG tablet Take 81 mg by mouth daily. 07/23/08   [provider]  atorvastatin (LIPITOR) 80 MG tablet Take 80 mg by mouth every morning. 06/13/20   [provider]  Carboxymethylcellul-Glycerin (LUBRICATING EYE DROPS OP) Place 1 drop into both eyes 2 (two) times daily.    [provider]   Cholecalciferol (VITAMIN D3) 2000 units capsule Take 2,000 Units by mouth daily.     [provider]  clonazePAM (KLONOPIN) 1 MG tablet Take 1 mg by mouth at bedtime. 04/12/20   [provider]  colchicine 0.6 MG tablet Take 0.6 mg by mouth 2 (two) times daily as needed (gout). 01/02/20   [provider]  cyclobenzaprine (FLEXERIL) 10 MG tablet Take 10 mg by mouth every morning.    [provider]  diltiazem (CARDIZEM CD) 180 MG 24 hr capsule Take 180 mg by mouth every morning. 01/12/20   [provider]  donepezil (ARICEPT) 5 MG tablet Take 5 mg by mouth at bedtime. 05/30/20   [provider]  HYDROcodone-acetaminophen (NORCO/VICODIN) 5-325 MG tablet Take 1 tablet by mouth every 6 (six) hours as needed for moderate pain. 07/06/20   Stoioff, Ronda Fairly, MD  hydroxychloroquine (PLAQUENIL) 200 MG tablet Take 200 mg by mouth 2 (two) times daily. 05/31/20   [provider]  lamoTRIgine (LAMICTAL) 25 MG tablet TAKE 2 TABLETS BY MOUTH EVERY DAY Patient taking differently: Take 75 mg by mouth 2 (two) times daily. 07/22/18   Rainey Pines, MD  levothyroxine (SYNTHROID, LEVOTHROID) 25 MCG tablet Take 25 mcg by mouth daily before breakfast.    [provider]  meloxicam (MOBIC) 15 MG tablet Take 15 mg by mouth daily. 07/15/15   [provider]  montelukast (SINGULAIR) 10 MG tablet Take 10 mg by mouth at bedtime.    [provider]  pantoprazole (PROTONIX) 40 MG tablet Take 40 mg by mouth as needed. 04/30/20   [provider]  Riboflavin (VITAMIN B-2 PO) Take 400 mg by mouth daily.    [provider]  sucralfate (CARAFATE) 1 g tablet Take 1 g by mouth 4 (four) times daily as needed (heartburn).    [provider]  tamsulosin (FLOMAX) 0.4 MG CAPS capsule Take 1 capsule (0.4 mg total) by mouth daily after breakfast. 07/15/20   Stoioff, Ronda Fairly, MD  Thiamine HCl (B-1) 500 MG TABS Take 500 mg by mouth daily.     [provider]  Trospium Chloride 60 MG CP24 Take 60 mg by mouth every morning. 04/14/20   [provider]  vitamin B-12 (CYANOCOBALAMIN) 1000 MCG tablet Take 1,000 mcg by mouth daily.    [provider]  zolpidem (AMBIEN) 5 MG tablet Take 5 mg by mouth at bedtime. 02/02/17   [provider]   Physical Exam: Vitals:   07/30/20 1100 07/30/20 1130 07/30/20 1200 07/30/20 1230  BP: (!) 120/37 (!) 107/43 (!) 115/42 (!) 112/49  Pulse: 78 78 76 75  Resp: 16 19  16  Temp:      TempSrc:      SpO2: 97% 99% 99% 100%  Weight:      Height:       Constitutional: appears age-appropriate, NAD, calm, comfortable Eyes: PERRL, lids and conjunctivae normal ENMT: Mucous membranes are moist. Posterior pharynx clear of any exudate or lesions. Age-appropriate dentition.  Mild hearing loss Neck: normal, supple, no masses, no thyromegaly Respiratory: clear to auscultation bilaterally, no wheezing, no crackles. Normal respiratory effort. No accessory muscle use.  Cardiovascular: Regular rate and rhythm, no murmurs / rubs / gallops. No extremity edema. 2+ pedal pulses. No carotid bruits.  Abdomen: Obese abdomen, no tenderness, no masses palpated, no hepatosplenomegaly. Bowel sounds positive.  Musculoskeletal: no clubbing / cyanosis. No joint deformity upper and lower extremities. Good ROM, no contractures, no atrophy. Normal muscle tone.  Skin: no rashes, lesions, ulcers. No induration Neurologic: Sensation intact. Strength 5/5 in all 4.  Psychiatric: Normal judgment and insight. Alert and oriented x 3. Normal mood.   EKG: Not indicated  Chest x-ray on Admission: I personally reviewed and I agree with radiologist reading as below.  DG Chest 2 View  Result Date: 07/30/2020 CLINICAL DATA:  Weakness and multiple falls for 2 days EXAM: CHEST - 2 VIEW COMPARISON:  None. FINDINGS: Heart size within normal limits. Increased interstitial opacity seen throughout both lungs may be due  to pulmonary vascular congestion. Small left pleural effusion is present. Postsurgical changes of CABG again seen. IMPRESSION: Increased interstitial opacity seen throughout both lungs favored to be pulmonary edema. Small left pleural effusion also present. Electronically Signed   By: Miachel Roux M.D.   On: 07/30/2020 10:09   CT Head Wo Contrast  Result Date: 07/30/2020 CLINICAL DATA:  Acute neuro deficit. EXAM: CT HEAD WITHOUT CONTRAST TECHNIQUE: Contiguous axial images were obtained from the base of the skull through the vertex without intravenous contrast. COMPARISON:  April 07, 2020. FINDINGS: Brain: No evidence of acute infarction, hemorrhage, hydrocephalus, extra-axial collection or mass lesion/mass effect. Vascular: No hyperdense vessel or unexpected calcification. Skull: Normal. Negative for fracture or focal lesion. Sinuses/Orbits: No acute finding. Other: None. IMPRESSION: No acute intracranial abnormality seen. Electronically Signed   By: Marijo Conception M.D.   On: 07/30/2020 10:31    Labs on Admission: I have personally reviewed following labs  CBC: Recent Labs  Lab 07/30/20 0921  WBC 19.2*  NEUTROABS 17.1*  HGB 10.9*  HCT 33.8*  MCV 93.6  PLT 542   Basic Metabolic Panel: Recent Labs  Lab 07/30/20 0921  NA 133*  K 3.5  CL 100  CO2 22  GLUCOSE 172*  BUN 20  CREATININE 1.30*  CALCIUM 8.0*   GFR: Estimated Creatinine Clearance: 49.1 mL/min (A) (by C-G formula based on SCr of 1.3 mg/dL (H)).  Liver Function Tests: Recent Labs  Lab 07/30/20 0921  AST 26  ALT 10  ALKPHOS 78  BILITOT 1.2  PROT 7.2  ALBUMIN 3.2*   Urine analysis:    Component Value Date/Time   COLORURINE AMBER (A) 07/30/2020 1109   APPEARANCEUR CLOUDY (A) 07/30/2020 1109   APPEARANCEUR Clear 06/25/2020 1052   LABSPEC 1.030 07/30/2020 1109   LABSPEC 1.024 05/27/2014 0827   PHURINE 5.0 07/30/2020 1109   GLUCOSEU 50 (A) 07/30/2020 1109   GLUCOSEU Negative 05/27/2014 0827   HGBUR SMALL (A)  07/30/2020 1109   BILIRUBINUR NEGATIVE 07/30/2020 1109   BILIRUBINUR Negative 06/25/2020 1052   BILIRUBINUR Negative 05/27/2014 0827   KETONESUR 5 (A) 07/30/2020 1109  PROTEINUR 100 (A) 07/30/2020 1109   NITRITE NEGATIVE 07/30/2020 1109   LEUKOCYTESUR MODERATE (A) 07/30/2020 1109   LEUKOCYTESUR Negative 05/27/2014 0827   Dr. Tobie Poet [facility]  If 7PM-7AM, please contact overnight-coverage provider If 7AM-7PM, please contact day coverage provider [email or web address]  07/30/2020, 12:43 PM

## 2020-07-30 NOTE — ED Notes (Signed)
Report to [place], rn.

## 2020-07-30 NOTE — ED Notes (Signed)
Husband wants to be called when pt goes to the floor.Marland KitchenMarland Kitchen

## 2020-07-31 DIAGNOSIS — A419 Sepsis, unspecified organism: Secondary | ICD-10-CM | POA: Diagnosis present

## 2020-07-31 DIAGNOSIS — K219 Gastro-esophageal reflux disease without esophagitis: Secondary | ICD-10-CM | POA: Diagnosis present

## 2020-07-31 DIAGNOSIS — I252 Old myocardial infarction: Secondary | ICD-10-CM | POA: Diagnosis not present

## 2020-07-31 DIAGNOSIS — E039 Hypothyroidism, unspecified: Secondary | ICD-10-CM | POA: Diagnosis present

## 2020-07-31 DIAGNOSIS — E785 Hyperlipidemia, unspecified: Secondary | ICD-10-CM | POA: Diagnosis present

## 2020-07-31 DIAGNOSIS — M1990 Unspecified osteoarthritis, unspecified site: Secondary | ICD-10-CM | POA: Diagnosis present

## 2020-07-31 DIAGNOSIS — F411 Generalized anxiety disorder: Secondary | ICD-10-CM | POA: Diagnosis present

## 2020-07-31 DIAGNOSIS — Z803 Family history of malignant neoplasm of breast: Secondary | ICD-10-CM | POA: Diagnosis not present

## 2020-07-31 DIAGNOSIS — Z20822 Contact with and (suspected) exposure to covid-19: Secondary | ICD-10-CM | POA: Diagnosis present

## 2020-07-31 DIAGNOSIS — Z853 Personal history of malignant neoplasm of breast: Secondary | ICD-10-CM | POA: Diagnosis not present

## 2020-07-31 DIAGNOSIS — Z7982 Long term (current) use of aspirin: Secondary | ICD-10-CM | POA: Diagnosis not present

## 2020-07-31 DIAGNOSIS — Z79899 Other long term (current) drug therapy: Secondary | ICD-10-CM | POA: Diagnosis not present

## 2020-07-31 DIAGNOSIS — F329 Major depressive disorder, single episode, unspecified: Secondary | ICD-10-CM | POA: Diagnosis present

## 2020-07-31 DIAGNOSIS — Z96653 Presence of artificial knee joint, bilateral: Secondary | ICD-10-CM | POA: Diagnosis present

## 2020-07-31 DIAGNOSIS — K529 Noninfective gastroenteritis and colitis, unspecified: Secondary | ICD-10-CM | POA: Diagnosis present

## 2020-07-31 DIAGNOSIS — Z8673 Personal history of transient ischemic attack (TIA), and cerebral infarction without residual deficits: Secondary | ICD-10-CM | POA: Diagnosis not present

## 2020-07-31 DIAGNOSIS — G2581 Restless legs syndrome: Secondary | ICD-10-CM | POA: Diagnosis present

## 2020-07-31 DIAGNOSIS — I480 Paroxysmal atrial fibrillation: Secondary | ICD-10-CM | POA: Diagnosis present

## 2020-07-31 DIAGNOSIS — Z923 Personal history of irradiation: Secondary | ICD-10-CM | POA: Diagnosis not present

## 2020-07-31 DIAGNOSIS — Z79890 Hormone replacement therapy (postmenopausal): Secondary | ICD-10-CM | POA: Diagnosis not present

## 2020-07-31 DIAGNOSIS — G40909 Epilepsy, unspecified, not intractable, without status epilepticus: Secondary | ICD-10-CM | POA: Diagnosis present

## 2020-07-31 DIAGNOSIS — J449 Chronic obstructive pulmonary disease, unspecified: Secondary | ICD-10-CM | POA: Diagnosis present

## 2020-07-31 DIAGNOSIS — I2510 Atherosclerotic heart disease of native coronary artery without angina pectoris: Secondary | ICD-10-CM | POA: Diagnosis present

## 2020-07-31 DIAGNOSIS — Z791 Long term (current) use of non-steroidal anti-inflammatories (NSAID): Secondary | ICD-10-CM | POA: Diagnosis not present

## 2020-07-31 DIAGNOSIS — N390 Urinary tract infection, site not specified: Secondary | ICD-10-CM | POA: Diagnosis present

## 2020-07-31 LAB — BASIC METABOLIC PANEL
Anion gap: 11 (ref 5–15)
BUN: 21 mg/dL (ref 8–23)
CO2: 25 mmol/L (ref 22–32)
Calcium: 8.2 mg/dL — ABNORMAL LOW (ref 8.9–10.3)
Chloride: 101 mmol/L (ref 98–111)
Creatinine, Ser: 1.17 mg/dL — ABNORMAL HIGH (ref 0.44–1.00)
GFR, Estimated: 50 mL/min — ABNORMAL LOW (ref >60)
Glucose, Bld: 94 mg/dL (ref 70–99)
Potassium: 3.7 mmol/L (ref 3.5–5.1)
Sodium: 137 mmol/L (ref 135–145)

## 2020-07-31 LAB — CBC
HCT: 31.9 % — ABNORMAL LOW (ref 36.0–46.0)
Hemoglobin: 10.4 g/dL — ABNORMAL LOW (ref 12.0–15.0)
MCH: 29.5 pg (ref 26.0–34.0)
MCHC: 32.6 g/dL (ref 30.0–36.0)
MCV: 90.6 fL (ref 80.0–100.0)
Platelets: 159 10*3/uL (ref 150–400)
RBC: 3.52 MIL/uL — ABNORMAL LOW (ref 3.87–5.11)
RDW: 14.5 % (ref 11.5–15.5)
WBC: 12 10*3/uL — ABNORMAL HIGH (ref 4.0–10.5)
nRBC: 0 % (ref 0.0–0.2)

## 2020-07-31 LAB — CBG MONITORING, ED: Glucose-Capillary: 98 mg/dL (ref 70–99)

## 2020-07-31 LAB — PROCALCITONIN: Procalcitonin: 1.02 ng/mL

## 2020-07-31 MED ORDER — SODIUM CHLORIDE 0.9 % IV SOLN: INTRAVENOUS | Status: DC

## 2020-07-31 NOTE — Progress Notes (Signed)
PROGRESS NOTE  Tonya Walton  DOB: 09/03/1950  PCP: Rusty Aus, MD ZOX:[id number]  DOA: 07/30/2020  LOS: 0 days  Hospital Day: 2   Chief Complaint  Patient presents with   Weakness   Diarrhea    Brief narrative: Tonya Walton is a 70 y.o. female with PMH significant for HTN, HLD, CAD/CABG, paroxysmal A. fib, hypothyroidism, depression, seizures, nephrolithiasis as well as some unknown autoimmune disorder. Patient presented to the ED with complaint of diarrhea and profound weakness for 3 to 4 days.  Of note, patient has abnormal ureteral anatomy, left nephrolithiasis and hydrocalyx.  She follows up with urologist Dr. Bernardo Heater.  6/1, and ureteroscopic stone removal was unsuccessful, stent was placed.  6/28, patient underwent cystoscopy, ureteroscopy laser lithotripsy and left ureteral stent exchange.  Patient states she no longer has urinary symptoms since the procedure but around the same time, she started having profuse diarrhea and weakness and hence presented to the ED on 7/1.  In the ED, patient had a temperature of 100, hemodynamically stable Labs with WC count elevated to 19.2, lactic acid elevated to 3.5, hemoglobin 10.9. Urinalysis with amber color cloudy urine with moderate amount leukocytes. Patient was admitted to hospitalist service with sepsis secondary to UTI  Subjective: Patient was seen and examined this morning.  Pleasant elderly Caucasian female.  Propped up in bed.  Not in distress.  Looks weak. No recurrence of fever, WBC improving  Assessment/Plan: Sepsis - POA -Source: Urine versus stool -Presented with fever, leukocytosis, lactic acidosis in a background of recent left ureteral stenting.  Urinalysis shows cloudy urine with moderate leukocytes but patient denies any urinary symptoms at this time. -Diarrhea is concerning in the setting of long-term antibiotic use from November 2021 to June 2022. -To rule out C. difficile. -Currently on IV Rocephin and IV  Flagyl. -Lactic acid level normalized. -Continue maintenance IV fluid with normal saline at 75 mL/h. Recent Labs  Lab 07/30/20 0920 07/30/20 0921 07/30/20 1205 07/30/20 1656 07/31/20 0748  WBC  --  19.2*  --   --  12.0*  LATICACIDVEN 3.5*  --  1.4  --   --   PROCALCITON  --   --   --  1.32 1.02   Mild AKI -Continue monitor Recent Labs    04/07/20 0946 05/12/20 0851 06/30/20 0909 07/30/20 0921 07/31/20 0748  BUN 23  --  22 20 21   CREATININE 0.94 0.80 1.15* 1.30* 1.17*   Paroxysmal A. Fib -Currently in sinus rhythm on metoprolol and aspirin -Not on anticoagulation per cardiology note from 08/25/2015 -Continue outpatient follow-up with cardiologist Dr. Ubaldo Glassing  Reported history of autoimmune disorder -Continue Plaquenil.  Hyperlipidemia -Lipitor  History of gout -Allopurinol   Major depressive disorder Anxiety -On fluoxetine 40 mg daily, Klonopin at bedtime as needed  Hypothyroidism -Synthroid 25 mcg daily  History of seizures -Lamotrigine 75 mg twice daily  GERD -PPI  Insomnia -Continue Ambien 5 mg nightly  Mobility: Encourage ambulation.  PT eval ordered. Code Status:   Code Status: Full Code  Nutritional status: Body mass index is 38.07 kg/m.     Diet:  Diet Order             Diet Heart Room service appropriate? Yes; Fluid consistency: Thin  Diet effective now                  DVT prophylaxis:  enoxaparin (LOVENOX) injection 40 mg Start: 07/30/20 2200 Place TED hose Start: 07/30/20 1242   Antimicrobials:  IV Rocephin/IV Flagyl Fluid: Normal saline at 75 mill per hour Consultants: None Family Communication: None at bedside  Status is: Inpatient  Remains inpatient appropriate because: Ongoing sepsis  Dispo: The patient is from: Home              Anticipated d/c is to: Hopefully home with home health, pending PT eval              Patient currently is not medically stable to d/c.   Difficult to place patient No     Infusions:    sodium chloride     cefTRIAXone (ROCEPHIN)  IV     metronidazole 500 mg (07/31/20 0347)    Scheduled Meds:  allopurinol  100 mg Oral BH-q7a   aspirin EC  81 mg Oral Daily   atorvastatin  80 mg Oral BH-q7a   donepezil  5 mg Oral QHS   enoxaparin (LOVENOX) injection  40 mg Subcutaneous Q24H   FLUoxetine  40 mg Oral Daily   hydroxychloroquine  200 mg Oral BID   lamoTRIgine  75 mg Oral BID   levothyroxine  25 mcg Oral QAC breakfast   pantoprazole  40 mg Oral Daily   tamsulosin  0.4 mg Oral QPC breakfast   vitamin B-12  1,000 mcg Oral Daily   zolpidem  5 mg Oral QHS    Antimicrobials: Anti-infectives (From admission, onward)    Start     Dose/Rate Route Frequency Ordered Stop   07/31/20 1000  cefTRIAXone (ROCEPHIN) 1 g in sodium chloride 0.9 % 100 mL IVPB  Status:  Discontinued        1 g 200 mL/hr over 30 Minutes Intravenous Every 24 hours 07/30/20 1400 07/30/20 1403   07/31/20 1000  cefTRIAXone (ROCEPHIN) 2 g in sodium chloride 0.9 % 100 mL IVPB        2 g 200 mL/hr over 30 Minutes Intravenous Every 24 hours 07/30/20 1404     07/30/20 2200  hydroxychloroquine (PLAQUENIL) tablet 200 mg  Status:  Discontinued        200 mg Oral 2 times daily 07/30/20 1242 07/30/20 1423   07/30/20 2200  hydroxychloroquine (PLAQUENIL) tablet 200 mg        200 mg Oral 2 times daily 07/30/20 1423     07/30/20 1500  metroNIDAZOLE (FLAGYL) IVPB 500 mg        500 mg 100 mL/hr over 60 Minutes Intravenous Every 8 hours 07/30/20 1400     07/30/20 1230  levofloxacin (LEVAQUIN) IVPB 750 mg        750 mg 100 mL/hr over 90 Minutes Intravenous  Once 07/30/20 1221 07/30/20 1516       PRN meds: acetaminophen **OR** acetaminophen, clonazePAM, HYDROcodone-acetaminophen, ondansetron **OR** ondansetron (ZOFRAN) IV   Objective: Vitals:   07/31/20 0800 07/31/20 1000  BP: (!) 134/55 (!) 116/49  Pulse: 70 80  Resp: (!) 22 (!) 21  Temp:    SpO2: 100% 98%    Intake/Output Summary (Last 24 hours) at 07/31/2020  1116 Last data filed at 07/31/2020 0534 Gross per 24 hour  Intake --  Output 600 ml  Net -600 ml   Filed Weights   07/30/20 0915  Weight: 107 kg   Weight change:  Body mass index is 38.07 kg/m.   Physical Exam: General exam: Pleasant, elderly Caucasian female.  Propped up in bed.  Not in distress Skin: No rashes, lesions or ulcers. HEENT: Atraumatic, normocephalic, no obvious bleeding Lungs: Clear to auscultation bilaterally CVS: Regular  rate and rhythm, no murmur GI/Abd soft, nontender, mild tenderness on lower abdomen, bowel sound present CNS: Alert, awake, oriented x3 Psychiatry: Mood appropriate Extremities: No edema, no calf tenderness  Data Review: I have personally reviewed the laboratory data and studies available.  Recent Labs  Lab 07/30/20 0921 07/31/20 0748  WBC 19.2* 12.0*  NEUTROABS 17.1*  --   HGB 10.9* 10.4*  HCT 33.8* 31.9*  MCV 93.6 90.6  PLT 179 159   Recent Labs  Lab 07/30/20 0921 07/31/20 0748  NA 133* 137  K 3.5 3.7  CL 100 101  CO2 22 25  GLUCOSE 172* 94  BUN 20 21  CREATININE 1.30* 1.17*  CALCIUM 8.0* 8.2*    F/u labs ordered Unresulted Labs (From admission, onward)     Start     Ordered   07/30/20 1221  Urine culture  Add-on,   AD        07/30/20 1221   07/30/20 6387  Gastrointestinal Panel by PCR , Stool  (Gastrointestinal Panel by PCR, Stool                                                                                                                                                     **Does Not include CLOSTRIDIUM DIFFICILE testing. **If CDIFF testing is needed, place order from the "C Difficile Testing" order set.**)  Once,   STAT        07/30/20 0921   07/30/20 0921  C Difficile Quick Screen w PCR reflex  (C Difficile quick screen w PCR reflex panel )  Once, for 24 hours,   STAT       References:    CDiff Information Tool   07/30/20 0921   Unscheduled  Basic metabolic panel  Daily,   R      07/31/20 1115   Unscheduled  CBC  with Differential/Platelet  Daily,   R      07/31/20 1115   Unscheduled  Magnesium  Tomorrow morning,   STAT        07/31/20 1115   Unscheduled  Phosphorus  Tomorrow morning,   R        07/31/20 1115            Signed, Terrilee Croak, MD [facility] 07/31/2020

## 2020-07-31 NOTE — Progress Notes (Signed)
Patient admitted to room 120 from ED for antibiotic therapy.  VSS.  Minimal complaints.  Assessment completed.  Up with 1 assist to transfer from bed to [facility].  Weakness noted.  Patient on Falls Risk Precautions with yellow socks on and yellow band on.  No BM yet.  Awaiting stool sample for gastrointestinal panel study.  Patient for now on enteric precautions.  Call bell and needs within reach.  Will continue to monitor and assess per plan of care.

## 2020-08-01 ENCOUNTER — Encounter: Payer: Self-pay | Admitting: Internal Medicine

## 2020-08-01 LAB — BASIC METABOLIC PANEL
Anion gap: 6 (ref 5–15)
BUN: 18 mg/dL (ref 8–23)
CO2: 24 mmol/L (ref 22–32)
Calcium: 8 mg/dL — ABNORMAL LOW (ref 8.9–10.3)
Chloride: 107 mmol/L (ref 98–111)
Creatinine, Ser: 0.97 mg/dL (ref 0.44–1.00)
GFR, Estimated: >60 mL/min (ref >60)
Glucose, Bld: 101 mg/dL — ABNORMAL HIGH (ref 70–99)
Potassium: 3.4 mmol/L — ABNORMAL LOW (ref 3.5–5.1)
Sodium: 137 mmol/L (ref 135–145)

## 2020-08-01 LAB — CALCULI, WITH PHOTOGRAPH (CLINICAL LAB)
Calcium Oxalate Monohydrate: 100 %
Weight Calculi: 21 mg

## 2020-08-01 LAB — CBC WITH DIFFERENTIAL/PLATELET
Abs Immature Granulocytes: 0.07 10*3/uL (ref 0.00–0.07)
Basophils Absolute: 0 10*3/uL (ref 0.0–0.1)
Basophils Relative: 0 %
Eosinophils Absolute: 0.2 10*3/uL (ref 0.0–0.5)
Eosinophils Relative: 2 %
HCT: 27.9 % — ABNORMAL LOW (ref 36.0–46.0)
Hemoglobin: 9.3 g/dL — ABNORMAL LOW (ref 12.0–15.0)
Immature Granulocytes: 1 %
Lymphocytes Relative: 12 %
Lymphs Abs: 0.9 10*3/uL (ref 0.7–4.0)
MCH: 29.8 pg (ref 26.0–34.0)
MCHC: 33.3 g/dL (ref 30.0–36.0)
MCV: 89.4 fL (ref 80.0–100.0)
Monocytes Absolute: 1 10*3/uL (ref 0.1–1.0)
Monocytes Relative: 13 %
Neutro Abs: 5.4 10*3/uL (ref 1.7–7.7)
Neutrophils Relative %: 72 %
Platelets: 169 10*3/uL (ref 150–400)
RBC: 3.12 MIL/uL — ABNORMAL LOW (ref 3.87–5.11)
RDW: 14.2 % (ref 11.5–15.5)
WBC: 7.6 10*3/uL (ref 4.0–10.5)
nRBC: 0 % (ref 0.0–0.2)

## 2020-08-01 LAB — URINE CULTURE

## 2020-08-01 LAB — PHOSPHORUS: Phosphorus: 3.4 mg/dL (ref 2.5–4.6)

## 2020-08-01 LAB — MAGNESIUM: Magnesium: 1.9 mg/dL (ref 1.7–2.4)

## 2020-08-01 NOTE — Evaluation (Signed)
Physical Therapy Evaluation Patient Details Name: Tonya Walton MRN: [id number] DOB: [birth year] Today's Date: 08/01/2020   History of Present Illness  Patient is a 70 y.o. female with PMH significant for HTN, HLD, CAD/CABG, paroxysmal A. fib, hypothyroidism, depression, seizures, nephrolithiasis as well as some unknown autoimmune disorder. Patient presented to the ED with complaint of diarrhea and profound weakness for 3 to 4 days. Found to have sepsis with history of recent left ureteral stenting and diarrhea.   Clinical Impression  Patient is agreeable to PT and cooperative during session. Spouse at the bedside. Patient reports progressive weakness over the past month at home with 3 falls reported.  Patient required assistance to stand x 3 bouts, with increased independence with each transfer. She has generalized weakness with limited standing tolerance overall. Patient walked 2 bouts, short distance in room with rolling walker with Min guard assistance. Recommend to continue PT to maximize independence and facilitate return to prior level of function.   Patient is hopeful to discharge home, however is open to rehab if recommended. At this time, recommend SNF placement with the hope that patient will progress well enough while in the hospital to be discharge home with support from her husband.     Follow Up Recommendations SNF;Supervision for mobility/OOB    Equipment Recommendations  Rolling walker with 5" wheels    Recommendations for Other Services       Precautions / Restrictions Precautions Precautions: Fall Restrictions Weight Bearing Restrictions: No      Mobility  Bed Mobility Overal bed mobility: Needs Assistance Bed Mobility: Supine to Sit;Sit to Supine     Supine to sit: Min guard;HOB elevated Sit to supine: Supervision (head of bed flat)   General bed mobility comments: verbal cues for safety and technique. extra time required to complete tasks     Transfers Overall transfer level: Needs assistance Equipment used: Rolling walker (2 wheeled) Transfers: Sit to/from Stand Sit to Stand: Mod assist;Min assist;Min guard         General transfer comment: patient required Mod A with first standing bout, Min A with second standing bout, and Min gurd with 3rd stand. verbal cues for hand placement for safety  Ambulation/Gait Ambulation/Gait assistance: Min guard Gait Distance (Feet): 5 Feet Assistive device: Rolling walker (2 wheeled) Gait Pattern/deviations: Decreased stride length;Narrow base of support Gait velocity: decreased   General Gait Details: patient feels LE weakness in standing position. 2 bouts of walking 5 ft with rolling walker taking forward and backward steps. verbal cues for technique using rolling walker. unable to advance walking further due to generalized weakness and limited standing tolerance  Stairs            Wheelchair Mobility    Modified Rankin (Stroke Patients Only)       Balance Overall balance assessment: History of Falls;Needs assistance Sitting-balance support: Feet supported;No upper extremity supported Sitting balance-Leahy Scale: Good     Standing balance support: Bilateral upper extremity supported Standing balance-Leahy Scale: Fair Standing balance comment: patient relying heavily on rolling walker for support in standing position                             Pertinent Vitals/Pain Pain Assessment: No/denies pain    Home Living Family/patient expects to be discharged to:: Private residence Living Arrangements: Spouse/significant other Available Help at Discharge: Family;Available 24 hours/day Type of Home: House Home Access:  (one step up)     Home  Layout: One level Home Equipment: Walker - 4 wheels Additional Comments: patient reports 3 falls in the past 6 months    Prior Function Level of Independence: Independent         Comments: patient does report  progressive weakness over the last month, however is usually independent with ambulation and ADLs. she drvies.     Hand Dominance        Extremity/Trunk Assessment   Upper Extremity Assessment Upper Extremity Assessment: Generalized weakness    Lower Extremity Assessment Lower Extremity Assessment: Generalized weakness       Communication   Communication: No difficulties  Cognition Arousal/Alertness: Awake/alert Behavior During Therapy: WFL for tasks assessed/performed Overall Cognitive Status: Within Functional Limits for tasks assessed                                 General Comments: patient able to follow all direcitons without difficulty      General Comments      Exercises     Assessment/Plan    PT Assessment Patient needs continued PT services  PT Problem List Decreased strength;Decreased activity tolerance;Decreased balance;Decreased mobility;Decreased knowledge of use of DME;Decreased safety awareness       PT Treatment Interventions DME instruction;Gait training;Stair training;Functional mobility training;Therapeutic activities;Therapeutic exercise;Balance training;Neuromuscular re-education;Patient/family education    PT Goals (Current goals can be found in the Care Plan section)  Acute Rehab PT Goals Patient Stated Goal: to go home PT Goal Formulation: With patient/family Time For Goal Achievement: 08/15/20 Potential to Achieve Goals: Good    Frequency Min 2X/week   Barriers to discharge        Co-evaluation               AM-PAC PT "6 Clicks" Mobility  Outcome Measure Help needed turning from your back to your side while in a flat bed without using bedrails?: A Little Help needed moving from lying on your back to sitting on the side of a flat bed without using bedrails?: A Little Help needed moving to and from a bed to a chair (including a wheelchair)?: A Lot Help needed standing up from a chair using your arms (e.g.,  wheelchair or bedside chair)?: A Little Help needed to walk in hospital room?: A Little Help needed climbing 3-5 steps with a railing? : A Lot 6 Click Score: 16    End of Session Equipment Utilized During Treatment: Gait belt Activity Tolerance: Patient tolerated treatment well Patient left: in bed;with call bell/phone within reach;with bed alarm set;with family/visitor present Nurse Communication: Mobility status PT Visit Diagnosis: Unsteadiness on feet (R26.81);Muscle weakness (generalized) (M62.81);History of falling (Z91.81)    Time: 2836-6294 PT Time Calculation (min) (ACUTE ONLY): 32 min   Charges:   PT Evaluation $PT Eval Moderate Complexity: 1 Mod PT Treatments $Therapeutic Activity: 8-22 mins        Minna Merritts, PT, MPT   Percell Locus 08/01/2020, 12:31 PM

## 2020-08-01 NOTE — Plan of Care (Signed)
  Problem: Clinical Measurements: Goal: Respiratory complications will improve Outcome: Progressing Goal: Cardiovascular complication will be avoided Outcome: Completed/Met   Problem: Clinical Measurements: Goal: Cardiovascular complication will be avoided Outcome: Completed/Met   Problem: Activity: Goal: Risk for activity intolerance will decrease Outcome: Completed/Met   Problem: Elimination: Goal: Will not experience complications related to bowel motility Outcome: Completed/Met   Problem: Safety: Goal: Ability to remain free from injury will improve Outcome: Progressing   Problem: Education: Goal: Knowledge of General Education information will improve Description: Including pain rating scale, medication(s)/side effects and non-pharmacologic comfort measures Outcome: Completed/Met   Problem: Activity: Goal: Risk for activity intolerance will decrease Outcome: Completed/Met   Problem: Elimination: Goal: Will not experience complications related to bowel motility Outcome: Completed/Met   Problem: Safety: Goal: Ability to remain free from injury will improve Outcome: Progressing   Problem: Education: Goal: Knowledge of General Education information will improve Description: Including pain rating scale, medication(s)/side effects and non-pharmacologic comfort measures Outcome: Completed/Met   Problem: Clinical Measurements: Goal: Ability to maintain clinical measurements within normal limits will improve Outcome: Completed/Met Goal: Diagnostic test results will improve Outcome: Completed/Met Goal: Respiratory complications will improve Outcome: Completed/Met   Problem: Clinical Measurements: Goal: Diagnostic test results will improve Outcome: Completed/Met   Problem: Clinical Measurements: Goal: Respiratory complications will improve Outcome: Completed/Met   Problem: Activity: Goal: Risk for activity intolerance will decrease Outcome: Completed/Met    Problem: Nutrition: Goal: Adequate nutrition will be maintained Outcome: Completed/Met   Problem: Coping: Goal: Level of anxiety will decrease Outcome: Completed/Met   Problem: Elimination: Goal: Will not experience complications related to bowel motility Outcome: Completed/Met Goal: Will not experience complications related to urinary retention Outcome: Completed/Met   Problem: Elimination: Goal: Will not experience complications related to urinary retention Outcome: Completed/Met   Problem: Pain Managment: Goal: General experience of comfort will improve Outcome: Completed/Met   Problem: Safety: Goal: Ability to remain free from injury will improve Outcome: Completed/Met   Problem: Skin Integrity: Goal: Risk for impaired skin integrity will decrease Outcome: Completed/Met   Problem: Skin Integrity: Goal: Risk for impaired skin integrity will decrease Outcome: Completed/Met

## 2020-08-01 NOTE — Progress Notes (Signed)
PROGRESS NOTE  Tonya Walton  DOB: Nov 07, 1950  PCP: Rusty Aus, MD GUY:[id number]  DOA: 07/30/2020  LOS: 1 day  Hospital Day: 3   Chief Complaint  Patient presents with   Weakness   Diarrhea    Brief narrative: Tonya Walton is a 70 y.o. female with PMH significant for HTN, HLD, CAD/CABG, paroxysmal A. fib, hypothyroidism, depression, seizures, nephrolithiasis as well as some unknown autoimmune disorder. Patient presented to the ED with complaint of diarrhea and profound weakness for 3 to 4 days.  Of note, patient has abnormal ureteral anatomy, left nephrolithiasis and hydrocalyx.  She follows up with urologist Dr. Bernardo Heater.  6/1, and ureteroscopic stone removal was unsuccessful, stent was placed.  6/28, patient underwent cystoscopy, ureteroscopy laser lithotripsy and left ureteral stent exchange.  Patient states she no longer has urinary symptoms since the procedure but around the same time, she started having profuse diarrhea and weakness and hence presented to the ED on 7/1.  In the ED, patient had a temperature of 100, hemodynamically stable Labs with WC count elevated to 19.2, lactic acid elevated to 3.5, hemoglobin 10.9. Urinalysis with amber color cloudy urine with moderate amount leukocytes. Patient was admitted to hospitalist service with sepsis secondary to UTI  Subjective: Patient was seen and examined this morning. Lying on bed.  Feels weak.  No diarrhea since admission.  No recurrence of fever.  WBC count better. Her only complaint at this time is weakness.  Assessment/Plan: Sepsis - POA -Source: Urine versus stool -Presented with fever, leukocytosis, lactic acidosis in a background of recent left ureteral stenting.  -Urinalysis shows cloudy urine with moderate leukocytes but patient denies any urinary symptoms at this time. -Diarrhea is concerning in the setting of long-term antibiotic use from November 2021 to June 2022. -To rule out C. difficile.  C.  difficile assay ordered but patient has not had any stool output since presentation. -Currently clinically improving on IV Rocephin and IV Flagyl. -Lactic acid level normalized. -Continue maintenance IV fluid with normal saline at 75 mL/h. Recent Labs  Lab 07/30/20 0920 07/30/20 0921 07/30/20 1205 07/30/20 1656 07/31/20 0748 08/01/20 0524  WBC  --  19.2*  --   --  12.0* 7.6  LATICACIDVEN 3.5*  --  1.4  --   --   --   PROCALCITON  --   --   --  1.32 1.02  --     Paroxysmal A. Fib -Currently in sinus rhythm on metoprolol and aspirin -Not on anticoagulation per cardiology note from 08/25/2015 -Continue outpatient follow-up with cardiologist Dr. Ubaldo Glassing  Reported history of autoimmune disorder -Continue Plaquenil.  Hyperlipidemia -Lipitor  History of gout -Allopurinol   Major depressive disorder Anxiety -On fluoxetine 40 mg daily, Klonopin at bedtime as needed  Hypothyroidism -Synthroid 25 mcg daily  History of seizures -Lamotrigine 75 mg twice daily  GERD -PPI  Insomnia -Continue Ambien 5 mg nightly  Impaired mobility -Pending PT eval  Code Status:   Code Status: Full Code  Nutritional status: Body mass index is 38.07 kg/m.     Diet:  Diet Order             Diet Heart Room service appropriate? Yes; Fluid consistency: Thin  Diet effective now                  DVT prophylaxis:  enoxaparin (LOVENOX) injection 40 mg Start: 07/30/20 2200 Place TED hose Start: 07/30/20 1242   Antimicrobials: IV Rocephin/IV Flagyl Fluid: Normal saline  at 74 mill per hour Consultants: None Family Communication: Husband at bedside  Status is: Inpatient  Remains inpatient appropriate because: On IV antibiotics, pending PT eval  Dispo: The patient is from: Home              Anticipated d/c is to: Hopefully home with home health, pending PT eval              Patient currently is not medically stable to d/c.   Difficult to place patient No     Infusions:   sodium  chloride 75 mL/hr at 08/01/20 1018   cefTRIAXone (ROCEPHIN)  IV 2 g (08/01/20 1150)   metronidazole 500 mg (08/01/20 1020)    Scheduled Meds:  allopurinol  100 mg Oral BH-q7a   aspirin EC  81 mg Oral Daily   atorvastatin  80 mg Oral BH-q7a   donepezil  5 mg Oral QHS   enoxaparin (LOVENOX) injection  40 mg Subcutaneous Q24H   FLUoxetine  40 mg Oral Daily   hydroxychloroquine  200 mg Oral BID   lamoTRIgine  75 mg Oral BID   levothyroxine  25 mcg Oral QAC breakfast   pantoprazole  40 mg Oral Daily   tamsulosin  0.4 mg Oral QPC breakfast   vitamin B-12  1,000 mcg Oral Daily   zolpidem  5 mg Oral QHS    Antimicrobials: Anti-infectives (From admission, onward)    Start     Dose/Rate Route Frequency Ordered Stop   07/31/20 1000  cefTRIAXone (ROCEPHIN) 1 g in sodium chloride 0.9 % 100 mL IVPB  Status:  Discontinued        1 g 200 mL/hr over 30 Minutes Intravenous Every 24 hours 07/30/20 1400 07/30/20 1403   07/31/20 1000  cefTRIAXone (ROCEPHIN) 2 g in sodium chloride 0.9 % 100 mL IVPB        2 g 200 mL/hr over 30 Minutes Intravenous Every 24 hours 07/30/20 1404     07/30/20 2200  hydroxychloroquine (PLAQUENIL) tablet 200 mg  Status:  Discontinued        200 mg Oral 2 times daily 07/30/20 1242 07/30/20 1423   07/30/20 2200  hydroxychloroquine (PLAQUENIL) tablet 200 mg        200 mg Oral 2 times daily 07/30/20 1423     07/30/20 1500  metroNIDAZOLE (FLAGYL) IVPB 500 mg        500 mg 100 mL/hr over 60 Minutes Intravenous Every 8 hours 07/30/20 1400     07/30/20 1230  levofloxacin (LEVAQUIN) IVPB 750 mg        750 mg 100 mL/hr over 90 Minutes Intravenous  Once 07/30/20 1221 07/30/20 1516       PRN meds: acetaminophen **OR** acetaminophen, clonazePAM, HYDROcodone-acetaminophen, ondansetron **OR** ondansetron (ZOFRAN) IV   Objective: Vitals:   08/01/20 0734 08/01/20 1153  BP: (!) 147/62 137/67  Pulse: 72 69  Resp: 20 20  Temp: 97.9 F (36.6 C) 98.1 F (36.7 C)  SpO2: 97% 95%     Intake/Output Summary (Last 24 hours) at 08/01/2020 1213 Last data filed at 08/01/2020 1000 Gross per 24 hour  Intake 923.49 ml  Output 975 ml  Net -51.51 ml    Filed Weights   07/30/20 0915  Weight: 107 kg   Weight change:  Body mass index is 38.07 kg/m.   Physical Exam: General exam: Pleasant, elderly Caucasian female.  Propped up in bed.  Not in distress Skin: No rashes, lesions or ulcers. HEENT: Atraumatic, normocephalic, no obvious  bleeding Lungs: Clear to auscultation bilaterally CVS: Regular rate and rhythm, no murmur GI/Abd soft, nontender, abdominal tenderness improved, bowel sound present CNS: Alert, awake, oriented x3 Psychiatry: Mood appropriate Extremities: No edema, no calf tenderness  Data Review: I have personally reviewed the laboratory data and studies available.  Recent Labs  Lab 07/30/20 0921 07/31/20 0748 08/01/20 0524  WBC 19.2* 12.0* 7.6  NEUTROABS 17.1*  --  5.4  HGB 10.9* 10.4* 9.3*  HCT 33.8* 31.9* 27.9*  MCV 93.6 90.6 89.4  PLT 179 159 169    Recent Labs  Lab 07/30/20 0921 07/31/20 0748 08/01/20 0524  NA 133* 137 137  K 3.5 3.7 3.4*  CL 100 101 107  CO2 22 25 24   GLUCOSE 172* 94 101*  BUN 20 21 18   CREATININE 1.30* 1.17* 0.97  CALCIUM 8.0* 8.2* 8.0*  MG  --   --  1.9  PHOS  --   --  3.4     F/u labs ordered Unresulted Labs (From admission, onward)     Start     Ordered   08/01/20 9924  Basic metabolic panel  Daily,   STAT      07/31/20 1115   08/01/20 0500  CBC with Differential/Platelet  Daily,   STAT      07/31/20 1115   07/30/20 0921  Gastrointestinal Panel by PCR , Stool  (Gastrointestinal Panel by PCR, Stool                                                                                                                                                     **Does Not include CLOSTRIDIUM DIFFICILE testing. **If CDIFF testing is needed, place order from the "C Difficile Testing" order set.**)  Once,   STAT         07/30/20 0921   07/30/20 0921  C Difficile Quick Screen w PCR reflex  (C Difficile quick screen w PCR reflex panel )  Once, for 24 hours,   STAT       References:    CDiff Information Tool   07/30/20 2683            Signed, Terrilee Croak, MD [facility] 08/01/2020

## 2020-08-02 LAB — CBC WITH DIFFERENTIAL/PLATELET
Abs Immature Granulocytes: 0.08 10*3/uL — ABNORMAL HIGH (ref 0.00–0.07)
Basophils Absolute: 0 10*3/uL (ref 0.0–0.1)
Basophils Relative: 1 %
Eosinophils Absolute: 0.3 10*3/uL (ref 0.0–0.5)
Eosinophils Relative: 4 %
HCT: 28.1 % — ABNORMAL LOW (ref 36.0–46.0)
Hemoglobin: 9.2 g/dL — ABNORMAL LOW (ref 12.0–15.0)
Immature Granulocytes: 1 %
Lymphocytes Relative: 15 %
Lymphs Abs: 1 10*3/uL (ref 0.7–4.0)
MCH: 29.3 pg (ref 26.0–34.0)
MCHC: 32.7 g/dL (ref 30.0–36.0)
MCV: 89.5 fL (ref 80.0–100.0)
Monocytes Absolute: 0.7 10*3/uL (ref 0.1–1.0)
Monocytes Relative: 10 %
Neutro Abs: 4.7 10*3/uL (ref 1.7–7.7)
Neutrophils Relative %: 69 %
Platelets: 187 10*3/uL (ref 150–400)
RBC: 3.14 MIL/uL — ABNORMAL LOW (ref 3.87–5.11)
RDW: 14.3 % (ref 11.5–15.5)
WBC: 6.8 10*3/uL (ref 4.0–10.5)
nRBC: 0 % (ref 0.0–0.2)

## 2020-08-02 LAB — BASIC METABOLIC PANEL
Anion gap: 7 (ref 5–15)
BUN: 19 mg/dL (ref 8–23)
CO2: 23 mmol/L (ref 22–32)
Calcium: 8.3 mg/dL — ABNORMAL LOW (ref 8.9–10.3)
Chloride: 110 mmol/L (ref 98–111)
Creatinine, Ser: 0.9 mg/dL (ref 0.44–1.00)
GFR, Estimated: >60 mL/min (ref >60)
Glucose, Bld: 120 mg/dL — ABNORMAL HIGH (ref 70–99)
Potassium: 3.5 mmol/L (ref 3.5–5.1)
Sodium: 140 mmol/L (ref 135–145)

## 2020-08-02 LAB — GASTROINTESTINAL PANEL BY PCR, STOOL (REPLACES STOOL CULTURE)

## 2020-08-02 LAB — C DIFFICILE QUICK SCREEN W PCR REFLEX
C Diff antigen: NEGATIVE
C Diff interpretation: NOT DETECTED
C Diff toxin: NEGATIVE

## 2020-08-02 MED ORDER — POLYVINYL ALCOHOL 1.4 % OP SOLN
2.0000 [drp] | Freq: Two times a day (BID) | OPHTHALMIC | Status: DC
  Administered 2020-08-02 – 2020-08-03 (×3): 2 [drp] via OPHTHALMIC
  Filled 2020-08-02: qty 15

## 2020-08-02 MED ORDER — CARBOXYMETHYLCELLUL-GLYCERIN 0.5-0.9 % OP SOLN: 2.0000 [drp] | Freq: Two times a day (BID) | OPHTHALMIC | Status: DC

## 2020-08-02 NOTE — TOC Progression Note (Signed)
Transition of Care [facility]) - Progression Note    Patient Details  Name: MERCER STALLWORTH MRN: [id number] Date of Birth: 28-Apr-1950  Transition of Care [facility]) CM/SW [place], RN Phone Number: 08/02/2020, 9:57 AM  Clinical Narrative:   Patient lives at home with husband, who can assist with care at home if needed.  Recommendation from therapy is for SNF, however patient is choosing to go home.  Patient states she will continue to work with PT until discharge and discuss disposition later today and tomorrow.  Patient has a walker at home, will discuss with PT if the walker is appropriate for home use.  She does not currently have other home services such as home health, but would welcome this service to assist her at home.  Patient has no further concerns at this time, TOC contact information given, TOC to follow to discharge.         Expected Discharge Plan and Services                                                 Social Determinants of Health (SDOH) Interventions    Readmission Risk Interventions No flowsheet data found.

## 2020-08-02 NOTE — Progress Notes (Signed)
PROGRESS NOTE  Tonya Walton  DOB: May 08, 1950  PCP: Rusty Aus, MD OIZ:[id number]  DOA: 07/30/2020  LOS: 2 days  Hospital Day: 4   Chief Complaint  Patient presents with   Weakness   Diarrhea    Brief narrative: Tonya Walton is a 70 y.o. female with PMH significant for HTN, HLD, CAD/CABG, paroxysmal A. fib, hypothyroidism, depression, seizures, nephrolithiasis as well as some unknown autoimmune disorder. Patient presented to the ED with complaint of diarrhea and profound weakness for 3 to 4 days.  Of note, patient has abnormal ureteral anatomy, left nephrolithiasis and hydrocalyx.  She follows up with urologist Dr. Bernardo Heater.  6/1, and ureteroscopic stone removal was unsuccessful, stent was placed.  6/28, patient underwent cystoscopy, ureteroscopy laser lithotripsy and left ureteral stent exchange.  Patient states she no longer has urinary symptoms since the procedure but around the same time, she started having profuse diarrhea and weakness and hence presented to the ED on 7/1.  In the ED, patient had a temperature of 100, hemodynamically stable Labs with WC count elevated to 19.2, lactic acid elevated to 3.5, hemoglobin 10.9. Urinalysis with amber color cloudy urine with moderate amount leukocytes. Patient was admitted to hospitalist service with sepsis secondary to UTI  Subjective: Patient was seen and examined this morning. Lying on bed.  Not in distress.  Feels well more energetic today. Had a bowel movement this morning after 3 days, reports it was soft.  Assessment/Plan: Sepsis - POA -Source: Urine versus stool -Presented with fever, leukocytosis, lactic acidosis in a background of recent left ureteral stenting.  -Urinalysis shows cloudy urine with moderate leukocytes but patient denies any urinary symptoms at this time. -Diarrhea is concerning in the setting of long-term antibiotic use from November 2021 to June 2022.  C. difficile testing was planned however,  patient did not have bowel movement for 3 days.  Currently has a bowel movement today and it is soft.  No need to check C. difficile.  Can take off isolation. -Currently clinically improving on IV Rocephin and IV Flagyl. -Lactic acid level normalized.  Okay to stop IV fluid today. Recent Labs  Lab 07/30/20 0920 07/30/20 0921 07/30/20 1205 07/30/20 1656 07/31/20 0748 08/01/20 0524 08/02/20 0544  WBC  --  19.2*  --   --  12.0* 7.6 6.8  LATICACIDVEN 3.5*  --  1.4  --   --   --   --   PROCALCITON  --   --   --  1.32 1.02  --   --    Paroxysmal A. Fib -Currently in sinus rhythm on metoprolol and aspirin -Not on anticoagulation per cardiology note from 08/25/2015 -Continue outpatient follow-up with cardiologist Dr. Ubaldo Glassing  Reported history of autoimmune disorder -Continue Plaquenil.  Hyperlipidemia -Lipitor  History of gout -Allopurinol   Major depressive disorder Anxiety -On fluoxetine 40 mg daily, Klonopin at bedtime as needed  Hypothyroidism -Synthroid 25 mcg daily  History of seizures -Lamotrigine 75 mg twice daily  GERD -PPI  Insomnia -Continue Ambien 5 mg nightly  Impaired mobility -PT eval obtained.  SNF recommended.  Code Status:   Code Status: Full Code  Nutritional status: Body mass index is 38.07 kg/m.     Diet:  Diet Order             Diet Heart Room service appropriate? Yes; Fluid consistency: Thin  Diet effective now                  DVT  prophylaxis:  enoxaparin (LOVENOX) injection 40 mg Start: 07/30/20 2200 Place TED hose Start: 07/30/20 1242   Antimicrobials: IV Rocephin/IV Flagyl Fluid: Okay to stop IV fluid today. Consultants: None Family Communication: Family not at bedside today  Status is: Inpatient  Remains inpatient appropriate because: On IV antibiotics, pending placement  Dispo: The patient is from: Home              Anticipated d/c is to: SNF recommended              Patient currently is medically stable to d/c.    Difficult to place patient No     Infusions:   sodium chloride 75 mL/hr at 08/02/20 0201   cefTRIAXone (ROCEPHIN)  IV 2 g (08/02/20 0836)   metronidazole 500 mg (08/02/20 6433)    Scheduled Meds:  allopurinol  100 mg Oral BH-q7a   aspirin EC  81 mg Oral Daily   atorvastatin  80 mg Oral BH-q7a   donepezil  5 mg Oral QHS   enoxaparin (LOVENOX) injection  40 mg Subcutaneous Q24H   FLUoxetine  40 mg Oral Daily   hydroxychloroquine  200 mg Oral BID   lamoTRIgine  75 mg Oral BID   levothyroxine  25 mcg Oral QAC breakfast   pantoprazole  40 mg Oral Daily   polyvinyl alcohol  2 drop Both Eyes BID   tamsulosin  0.4 mg Oral QPC breakfast   vitamin B-12  1,000 mcg Oral Daily   zolpidem  5 mg Oral QHS    Antimicrobials: Anti-infectives (From admission, onward)    Start     Dose/Rate Route Frequency Ordered Stop   07/31/20 1000  cefTRIAXone (ROCEPHIN) 1 g in sodium chloride 0.9 % 100 mL IVPB  Status:  Discontinued        1 g 200 mL/hr over 30 Minutes Intravenous Every 24 hours 07/30/20 1400 07/30/20 1403   07/31/20 1000  cefTRIAXone (ROCEPHIN) 2 g in sodium chloride 0.9 % 100 mL IVPB        2 g 200 mL/hr over 30 Minutes Intravenous Every 24 hours 07/30/20 1404     07/30/20 2200  hydroxychloroquine (PLAQUENIL) tablet 200 mg  Status:  Discontinued        200 mg Oral 2 times daily 07/30/20 1242 07/30/20 1423   07/30/20 2200  hydroxychloroquine (PLAQUENIL) tablet 200 mg        200 mg Oral 2 times daily 07/30/20 1423     07/30/20 1500  metroNIDAZOLE (FLAGYL) IVPB 500 mg        500 mg 100 mL/hr over 60 Minutes Intravenous Every 8 hours 07/30/20 1400     07/30/20 1230  levofloxacin (LEVAQUIN) IVPB 750 mg        750 mg 100 mL/hr over 90 Minutes Intravenous  Once 07/30/20 1221 07/30/20 1516       PRN meds: acetaminophen **OR** acetaminophen, clonazePAM, HYDROcodone-acetaminophen, ondansetron **OR** ondansetron (ZOFRAN) IV   Objective: Vitals:   08/02/20 0558 08/02/20 0909  BP: (!)  130/51 140/65  Pulse: 72 81  Resp: 16 16  Temp: 98.1 F (36.7 C) 98.1 F (36.7 C)  SpO2: 95% 99%    Intake/Output Summary (Last 24 hours) at 08/02/2020 1003 Last data filed at 08/02/2020 0558 Gross per 24 hour  Intake --  Output 1525 ml  Net -1525 ml    Filed Weights   07/30/20 0915  Weight: 107 kg   Weight change:  Body mass index is 38.07 kg/m.   Physical  Exam: General exam: Pleasant, elderly Caucasian female.  Lying on bed.  Not in distress.  No new symptoms Skin: No rashes, lesions or ulcers. HEENT: Atraumatic, normocephalic, no obvious bleeding Lungs: Clear to auscultation bilaterally CVS: Regular rate and rhythm, no murmur GI/Abd soft, nontender, abdominal tenderness improved, bowel sound present CNS: Alert, awake, oriented x3 Psychiatry: Mood appropriate Extremities: No edema, no calf tenderness  Data Review: I have personally reviewed the laboratory data and studies available.  Recent Labs  Lab 07/30/20 0921 07/31/20 0748 08/01/20 0524 08/02/20 0544  WBC 19.2* 12.0* 7.6 6.8  NEUTROABS 17.1*  --  5.4 4.7  HGB 10.9* 10.4* 9.3* 9.2*  HCT 33.8* 31.9* 27.9* 28.1*  MCV 93.6 90.6 89.4 89.5  PLT 179 159 169 187    Recent Labs  Lab 07/30/20 0921 07/31/20 0748 08/01/20 0524 08/02/20 0544  NA 133* 137 137 140  K 3.5 3.7 3.4* 3.5  CL 100 101 107 110  CO2 22 25 24 23   GLUCOSE 172* 94 101* 120*  BUN 20 21 18 19   CREATININE 1.30* 1.17* 0.97 0.90  CALCIUM 8.0* 8.2* 8.0* 8.3*  MG  --   --  1.9  --   PHOS  --   --  3.4  --      F/u labs ordered Unresulted Labs (From admission, onward)     Start     Ordered   08/01/20 4174  Basic metabolic panel  Daily,   STAT      07/31/20 1115   08/01/20 0500  CBC with Differential/Platelet  Daily,   STAT      07/31/20 1115   07/30/20 0921  Gastrointestinal Panel by PCR , Stool  (Gastrointestinal Panel by PCR, Stool                                                                                                                                                      **Does Not include CLOSTRIDIUM DIFFICILE testing. **If CDIFF testing is needed, place order from the "C Difficile Testing" order set.**)  Once,   STAT        07/30/20 0921   07/30/20 0921  C Difficile Quick Screen w PCR reflex  (C Difficile quick screen w PCR reflex panel )  Once, for 24 hours,   STAT       References:    CDiff Information Tool   07/30/20 0814            Signed, Terrilee Croak, MD [facility] 08/02/2020

## 2020-08-02 NOTE — Plan of Care (Signed)
  Problem: Clinical Measurements: Goal: Will remain free from infection Outcome: Progressing   Problem: Elimination: Goal: Will not experience complications related to urinary retention Outcome: Progressing   Problem: Health Behavior/Discharge Planning: Goal: Ability to manage health-related needs will improve Outcome: Progressing   Problem: Clinical Measurements: Goal: Will remain free from infection Outcome: Progressing   Problem: Clinical Measurements: Goal: Ability to maintain clinical measurements within normal limits will improve Outcome: Progressing

## 2020-08-02 NOTE — Plan of Care (Signed)
  Problem: Clinical Measurements: Goal: Ability to maintain clinical measurements within normal limits will improve Outcome: Progressing Goal: Will remain free from infection Outcome: Progressing Goal: Diagnostic test results will improve Outcome: Progressing Goal: Respiratory complications will improve Outcome: Progressing   Problem: Elimination: Goal: Will not experience complications related to urinary retention Outcome: Progressing   Problem: Safety: Goal: Ability to remain free from injury will improve Outcome: Progressing   Problem: Health Behavior/Discharge Planning: Goal: Ability to manage health-related needs will improve Outcome: Progressing   Problem: Clinical Measurements: Goal: Will remain free from infection Outcome: Progressing Goal: Cardiovascular complication will be avoided Outcome: Progressing

## 2020-08-03 LAB — BASIC METABOLIC PANEL
Anion gap: 7 (ref 5–15)
BUN: 16 mg/dL (ref 8–23)
CO2: 25 mmol/L (ref 22–32)
Calcium: 8.3 mg/dL — ABNORMAL LOW (ref 8.9–10.3)
Chloride: 110 mmol/L (ref 98–111)
Creatinine, Ser: 0.85 mg/dL (ref 0.44–1.00)
GFR, Estimated: >60 mL/min (ref >60)
Glucose, Bld: 110 mg/dL — ABNORMAL HIGH (ref 70–99)
Potassium: 3.7 mmol/L (ref 3.5–5.1)
Sodium: 142 mmol/L (ref 135–145)

## 2020-08-03 LAB — CBC WITH DIFFERENTIAL/PLATELET
Abs Immature Granulocytes: 0.09 10*3/uL — ABNORMAL HIGH (ref 0.00–0.07)
Basophils Absolute: 0 10*3/uL (ref 0.0–0.1)
Basophils Relative: 1 %
Eosinophils Absolute: 0.3 10*3/uL (ref 0.0–0.5)
Eosinophils Relative: 4 %
HCT: 27.7 % — ABNORMAL LOW (ref 36.0–46.0)
Hemoglobin: 9.2 g/dL — ABNORMAL LOW (ref 12.0–15.0)
Immature Granulocytes: 1 %
Lymphocytes Relative: 18 %
Lymphs Abs: 1.4 10*3/uL (ref 0.7–4.0)
MCH: 29.5 pg (ref 26.0–34.0)
MCHC: 33.2 g/dL (ref 30.0–36.0)
MCV: 88.8 fL (ref 80.0–100.0)
Monocytes Absolute: 0.7 10*3/uL (ref 0.1–1.0)
Monocytes Relative: 9 %
Neutro Abs: 5.1 10*3/uL (ref 1.7–7.7)
Neutrophils Relative %: 67 %
Platelets: 216 10*3/uL (ref 150–400)
RBC: 3.12 MIL/uL — ABNORMAL LOW (ref 3.87–5.11)
RDW: 14.3 % (ref 11.5–15.5)
Smear Review: NORMAL
WBC: 7.5 10*3/uL (ref 4.0–10.5)
nRBC: 0 % (ref 0.0–0.2)

## 2020-08-03 MED ORDER — SACCHAROMYCES BOULARDII 250 MG PO CAPS: 250.0000 mg | ORAL_CAPSULE | Freq: Two times a day (BID) | ORAL | 0 refills | Status: AC

## 2020-08-03 MED ORDER — CEFDINIR 300 MG PO CAPS: 300.0000 mg | ORAL_CAPSULE | Freq: Two times a day (BID) | ORAL | 0 refills | Status: AC

## 2020-08-03 NOTE — Care Management Important Message (Signed)
Important Message  Patient Details  Name: Tonya Walton MRN: [id number] Date of Birth: [birth year]   Medicare Important Message Given:  Yes     Dannette Barbara 08/03/2020, 11:36 AM

## 2020-08-03 NOTE — TOC Progression Note (Signed)
Transition of Care [facility]) - Progression Note    Patient Details  Name: Tonya Walton MRN: [id number] Date of Birth: [birth year]  Transition of Care [facility]) CM/SW [place], RN Phone Number: 08/03/2020, 2:43 PM  Clinical Narrative:   Patient to go home with home health.  [facility] accepted, per [place], services can begin Wed or Thurs.           Expected Discharge Plan and Services           Expected Discharge Date: 08/03/20                                     Social Determinants of Health (SDOH) Interventions    Readmission Risk Interventions No flowsheet data found.

## 2020-08-03 NOTE — Progress Notes (Signed)
Physical Therapy Treatment Patient Details Name: Tonya Walton MRN: [id number] DOB: 11-02-1950 Today's Date: 08/03/2020    History of Present Illness Patient is a 70 y.o. female with PMH significant for HTN, HLD, CAD/CABG, paroxysmal A. fib, hypothyroidism, depression, seizures, nephrolithiasis as well as some unknown autoimmune disorder. Patient presented to the ED with complaint of diarrhea and profound weakness for 3 to 4 days. Found to have sepsis with history of recent left ureteral stenting and diarrhea.    PT Comments    Patient received in bed, husband present in room. Patient agreeable to PT session. Wants to go home. She is mod independent with bed mobility and transfers. Ambulated 175 feet with RW with cga. One standing rest break due to fatigue. Patient without lob or significant difficulty ambulating this distance. She will continue to benefit from skilled PT while here to improve strength and functional independence and safety. Discharge plan updated.    Follow Up Recommendations  Home health PT;Supervision - Intermittent     Equipment Recommendations  Rolling walker with 5" wheels;3in1 (PT)    Recommendations for Other Services       Precautions / Restrictions Precautions Precautions: Fall Restrictions Weight Bearing Restrictions: No    Mobility  Bed Mobility Overal bed mobility: Modified Independent Bed Mobility: Supine to Sit     Supine to sit: Modified independent (Device/Increase time)     General bed mobility comments: supine to sit with ease    Transfers Overall transfer level: Modified independent Equipment used: Rolling walker (2 wheeled) Transfers: Sit to/from Stand Sit to Stand: Modified independent (Device/Increase time)         General transfer comment: easily stood from bed  Ambulation/Gait Ambulation/Gait assistance: Min guard Gait Distance (Feet): 175 Feet Assistive device: Rolling walker (2 wheeled) Gait Pattern/deviations:  Step-through pattern Gait velocity: WFL   General Gait Details: patient with much improved mobility this visit compared to last. Reports feeling pretty strong   Stairs             Wheelchair Mobility    Modified Rankin (Stroke Patients Only)       Balance Overall balance assessment: Modified Independent;History of Falls Sitting-balance support: Feet supported Sitting balance-Leahy Scale: Good     Standing balance support: Bilateral upper extremity supported Standing balance-Leahy Scale: Fair Standing balance comment: light support of RW with mobility                            Cognition Arousal/Alertness: Awake/alert Behavior During Therapy: WFL for tasks assessed/performed Overall Cognitive Status: Within Functional Limits for tasks assessed                                        Exercises      General Comments        Pertinent Vitals/Pain Pain Assessment: No/denies pain    Home Living                      Prior Function            PT Goals (current goals can now be found in the care plan section) Acute Rehab PT Goals Patient Stated Goal: to go home PT Goal Formulation: With patient/family Time For Goal Achievement: 08/15/20 Potential to Achieve Goals: Good Progress towards PT goals: Progressing toward goals    Frequency  Min 2X/week      PT Plan Discharge plan needs to be updated    Co-evaluation              AM-PAC PT "6 Clicks" Mobility   Outcome Measure  Help needed turning from your back to your side while in a flat bed without using bedrails?: None Help needed moving from lying on your back to sitting on the side of a flat bed without using bedrails?: None Help needed moving to and from a bed to a chair (including a wheelchair)?: A Little Help needed standing up from a chair using your arms (e.g., wheelchair or bedside chair)?: A Little Help needed to walk in hospital room?: A  Little Help needed climbing 3-5 steps with a railing? : A Little 6 Click Score: 20    End of Session Equipment Utilized During Treatment: Gait belt Activity Tolerance: Patient tolerated treatment well Patient left: in chair;with call bell/phone within reach;with family/visitor present Nurse Communication: Mobility status PT Visit Diagnosis: Unsteadiness on feet (R26.81);Muscle weakness (generalized) (M62.81);History of falling (Z91.81)     Time: 8413-2440 PT Time Calculation (min) (ACUTE ONLY): 20 min  Charges:  $Gait Training: 8-22 mins                    Augustine Leverette, PT, GCS 08/03/20,10:19 AM

## 2020-08-03 NOTE — Discharge Summary (Signed)
Physician Discharge Summary  Tonya Walton LFY:[id number] DOB: April 04, 1950 DOA: 07/30/2020  PCP: Tonya Aus, MD  Admit date: 07/30/2020 Discharge date: 08/03/2020  Admitted From: Home Discharge disposition: Home with PT with DME   Code Status: Full Code  Diet Recommendation: Cardiac diet  Discharge Diagnosis:   Principal Problem:   Gastroenteritis Active Problems:   Anxiety   Benign essential HTN   Anxiety, generalized   Acid reflux   HLD (hyperlipidemia)   Adult hypothyroidism   Depression, major, single episode, severe (HCC)   AF (paroxysmal atrial fibrillation) (HCC)   Restless leg syndrome   MDD (major depressive disorder), recurrent episode, moderate (HCC)   Chronic obstructive pulmonary disease ([place])   Seizure disorder ([place])   Coronary artery disease with history of myocardial infarction without history of CABG   Neutrophilic leukocytosis   AKI (acute kidney injury) [facility])     Chief Complaint  Patient presents with   Weakness   Diarrhea    Brief narrative: Tonya Walton is a 70 y.o. female with PMH significant for HTN, HLD, CAD/CABG, paroxysmal A. fib, hypothyroidism, depression, seizures, nephrolithiasis as well as some unknown autoimmune disorder. Patient presented to the ED with complaint of diarrhea and profound weakness for 3 to 4 days.  Of note, patient has abnormal ureteral anatomy, left nephrolithiasis and hydrocalyx.  She follows up with urologist Tonya Walton.  6/1, and ureteroscopic stone removal was unsuccessful, stent was placed.  6/28, patient underwent cystoscopy, ureteroscopy laser lithotripsy and left ureteral stent exchange.  Patient states she no longer has urinary symptoms since the procedure but around the same time, she started having profuse diarrhea and weakness and hence presented to the ED on 7/1.  In the ED, patient had a temperature of 100, hemodynamically stable Labs with WC count elevated to 19.2, lactic acid elevated to 3.5,  hemoglobin 10.9. Urinalysis with amber color cloudy urine with moderate amount leukocytes. Patient was admitted to hospitalist service with sepsis secondary to UTI  Subjective: Patient was seen and examined this morning. Propped up in bed.  Not in distress.  No new symptoms.  Feels stronger today.  Was able to perform better with therapy today.  Recommended home health with PT.  Husband at bedside.  Assessment/Plan: Sepsis - POA -Source: Urine versus stool -Presented with fever, leukocytosis, lactic acidosis in a background of recent left ureteral stenting.  -Urinalysis shows cloudy urine with moderate leukocytes but patient denies any urinary symptoms at this time. -Diarrhea is concerning in the setting of long-term antibiotic use from November 2021 to June 2022.  C. difficile testing was planned however, diarrhea improved.  She started having soft bowel movements.   -Currently clinically improving on IV Rocephin and IV Flagyl.  Will discharge her on 3 more days of oral Omnicef. Recent Labs  Lab 07/30/20 0920 07/30/20 0921 07/30/20 1205 07/30/20 1656 07/31/20 0748 08/01/20 0524 08/02/20 0544 08/03/20 0455  WBC  --  19.2*  --   --  12.0* 7.6 6.8 7.5  LATICACIDVEN 3.5*  --  1.4  --   --   --   --   --   PROCALCITON  --   --   --  1.32 1.02  --   --   --   Paroxysmal A. Fib -Currently in sinus rhythm on metoprolol and aspirin -Not on anticoagulation per cardiology note from 08/25/2015 -Continue outpatient follow-up with cardiologist Tonya Walton  Reported history of autoimmune disorder -Continue Plaquenil.  Hyperlipidemia -Lipitor  History  of gout -Allopurinol   Major depressive disorder Anxiety -On fluoxetine 40 mg daily, Klonopin at bedtime as needed  Hypothyroidism -Synthroid 25 mcg daily  History of seizures -Lamotrigine 75 mg twice daily  GERD -PPI  Insomnia -Continue Ambien 5 mg nightly  Impaired mobility -PT eval obtained.  Home health PT  recommended.   Wound care: Incision (Closed) 07/06/20 Vagina Other (Comment) (Active)  Date First Assessed/Time First Assessed: 07/06/20 0810   Location: Vagina  Location Orientation: Other (Comment)    Assessments 07/06/2020  8:10 AM 07/06/2020  9:18 AM  Dressing Type None None  Site / Wound Assessment -- Dry;Clean  Drainage Amount -- None     No Linked orders to display     Incision (Closed) 07/27/20 Vagina (Active)  Date First Assessed/Time First Assessed: 07/27/20 0812   Location: Vagina    Assessments 07/27/2020  8:56 AM 07/27/2020  9:45 AM  Dressing Type None None  Site / Wound Assessment Dressing in place / Unable to assess Dressing in place / Unable to assess     No Linked orders to display    Discharge Exam:   Vitals:   08/02/20 2100 08/03/20 0059 08/03/20 0200 08/03/20 0505  BP:  (!) 120/34 135/61 (!) 148/69  Pulse:  71  72  Resp:  16  16  Temp: 97.7 F (36.5 C) (!) 97.5 F (36.4 C) 97.9 F (36.6 C) 97.9 F (36.6 C)  TempSrc: Oral  Oral   SpO2:  94%  97%  Weight:      Height:        Body mass index is 38.07 kg/m.  General exam: Pleasant, elderly Caucasian female.  Not in distress Skin: No rashes, lesions or ulcers. HEENT: Atraumatic, normocephalic, no obvious bleeding Lungs: Clear to auscultation bilaterally CVS: Regular rate and rhythm, no murmur GI/Abd soft, nontender, nondistended, bowel sound present CNS: Alert, awake, oriented x3 Psychiatry: Mood appropriate Extremities: No edema, no calf tenderness  Follow ups:   Discharge Instructions     Diet - low sodium heart healthy   Complete by: As directed    Increase activity slowly   Complete by: As directed        Follow-up Information     Tonya Aus, MD Follow up.   Specialty: Internal Medicine Contact information: [address] [address] [phone]                 Recommendations for Outpatient Follow-Up:   Follow-up with PCP as an  outpatient  Discharge Instructions:  Follow with Primary MD Tonya Aus, MD in 7 days   Get CBC/BMP checked in next visit within 1 week by PCP or SNF MD ( we routinely change or add medications that can affect your baseline labs and fluid status, therefore we recommend that you get the mentioned basic workup next visit with your PCP, your PCP may decide not to get them or add new tests based on their clinical decision)  On your next visit with your PCP, please Get Medicines reviewed and adjusted.  Please request your PCP  to go over all Hospital Tests and Procedure/Radiological results at the follow up, please get all Hospital records sent to your Prim MD by signing hospital release before you go home.  Activity: As tolerated with Full fall precautions use walker/cane & assistance as needed  For Heart failure patients - Check your Weight same time everyday, if you gain over 2 pounds, or you develop in leg swelling,  experience more shortness of breath or chest pain, call your Primary MD immediately. Follow Cardiac Low Salt Diet and 1.5 lit/day fluid restriction.  If you have smoked or chewed Tobacco in the last 2 yrs please stop smoking, stop any regular Alcohol  and or any Recreational drug use.  If you experience worsening of your admission symptoms, develop shortness of breath, life threatening emergency, suicidal or homicidal thoughts you must seek medical attention immediately by calling 911 or calling your MD immediately  if symptoms less severe.  You Must read complete instructions/literature along with all the possible adverse reactions/side effects for all the Medicines you take and that have been prescribed to you. Take any new Medicines after you have completely understood and accpet all the possible adverse reactions/side effects.   Do not drive, operate heavy machinery, perform activities at heights, swimming or participation in water activities or provide baby sitting services if  your were admitted for syncope or siezures until you have seen by Primary MD or a Neurologist and advised to do so again.  Do not drive when taking Pain medications.  Do not take more than prescribed Pain, Sleep and Anxiety Medications  Wear Seat belts while driving.   Please note You were cared for by a hospitalist during your hospital stay. If you have any questions about your discharge medications or the care you received while you were in the hospital after you are discharged, you can call the unit and asked to speak with the hospitalist on call if the hospitalist that took care of you is not available. Once you are discharged, your primary care physician will handle any further medical issues. Please note that NO REFILLS for any discharge medications will be authorized once you are discharged, as it is imperative that you return to your primary care physician (or establish a relationship with a primary care physician if you do not have one) for your aftercare needs so that they can reassess your need for medications and monitor your lab values.    Time coordinating discharge: 35 minutes  Allergies as of 08/03/2020   No Known Allergies      Medication List     STOP taking these medications    diltiazem 180 MG 24 hr capsule Commonly known as: CARDIZEM CD       TAKE these medications    allopurinol 100 MG tablet Commonly known as: ZYLOPRIM Take 100 mg by mouth every morning.   aspirin EC 81 MG tablet Take 81 mg by mouth daily.   atorvastatin 80 MG tablet Commonly known as: LIPITOR Take 80 mg by mouth every morning.   B-1 500 MG Tabs Take 500 mg by mouth daily.   cefdinir 300 MG capsule Commonly known as: OMNICEF Take 1 capsule (300 mg total) by mouth 2 (two) times daily for 3 days.   clonazePAM 1 MG tablet Commonly known as: KLONOPIN Take 1 mg by mouth at bedtime.   colchicine 0.6 MG tablet Take 0.6 mg by mouth 2 (two) times daily as needed (gout).    cyclobenzaprine 10 MG tablet Commonly known as: FLEXERIL Take 10 mg by mouth daily as needed.   donepezil 5 MG tablet Commonly known as: ARICEPT Take 5 mg by mouth at bedtime.   FLUoxetine 40 MG capsule Commonly known as: PROZAC Take 40 mg by mouth daily.   HYDROcodone-acetaminophen 5-325 MG tablet Commonly known as: NORCO/VICODIN Take 1 tablet by mouth every 6 (six) hours as needed for moderate pain.  hydroxychloroquine 200 MG tablet Commonly known as: PLAQUENIL Take 200 mg by mouth 2 (two) times daily.   levothyroxine 25 MCG tablet Commonly known as: SYNTHROID Take 25 mcg by mouth daily before breakfast.   LUBRICATING EYE DROPS OP Place 1 drop into both eyes 2 (two) times daily.   meloxicam 15 MG tablet Commonly known as: MOBIC Take 15 mg by mouth daily.   montelukast 10 MG tablet Commonly known as: SINGULAIR Take 10 mg by mouth at bedtime.   pantoprazole 40 MG tablet Commonly known as: PROTONIX Take 40 mg by mouth as needed.   saccharomyces boulardii 250 MG capsule Commonly known as: FLORASTOR Take 1 capsule (250 mg total) by mouth 2 (two) times daily for 5 days.   sucralfate 1 g tablet Commonly known as: CARAFATE Take 1 g by mouth 4 (four) times daily as needed (heartburn).   tamsulosin 0.4 MG Caps capsule Commonly known as: FLOMAX Take 1 capsule (0.4 mg total) by mouth daily after breakfast.   Trospium Chloride 60 MG Cp24 Take 60 mg by mouth every morning.   vitamin B-12 1000 MCG tablet Commonly known as: CYANOCOBALAMIN Take 1,000 mcg by mouth daily.   VITAMIN B-2 PO Take 400 mg by mouth daily.   Vitamin D3 50 MCG (2000 UT) capsule Take 2,000 Units by mouth daily.   zolpidem 5 MG tablet Commonly known as: AMBIEN Take 5 mg by mouth at bedtime.       ASK your doctor about these medications    lamoTRIgine 25 MG tablet Commonly known as: LAMICTAL TAKE 2 TABLETS BY MOUTH EVERY DAY               Durable Medical Equipment  (From  admission, onward)           Start     Ordered   08/03/20 1028  For home use only DME Walker rolling  Once       Question Answer Comment  Walker: With [place] Wheels   Patient needs a walker to treat with the following condition Impaired mobility      08/03/20 1028   08/03/20 1028  For home use only DME 3 n 1  Once        08/03/20 1028            The results of significant diagnostics from this hospitalization (including imaging, microbiology, ancillary and laboratory) are listed below for reference.    Procedures and Diagnostic Studies:   DG Chest 2 View  Result Date: 07/30/2020 CLINICAL DATA:  Weakness and multiple falls for 2 days EXAM: CHEST - 2 VIEW COMPARISON:  None. FINDINGS: Heart size within normal limits. Increased interstitial opacity seen throughout both lungs may be due to pulmonary vascular congestion. Small left pleural effusion is present. Postsurgical changes of CABG again seen. IMPRESSION: Increased interstitial opacity seen throughout both lungs favored to be pulmonary edema. Small left pleural effusion also present. Electronically Signed   By: Miachel Roux M.D.   On: 07/30/2020 10:09   CT Head Wo Contrast  Result Date: 07/30/2020 CLINICAL DATA:  Acute neuro deficit. EXAM: CT HEAD WITHOUT CONTRAST TECHNIQUE: Contiguous axial images were obtained from the base of the skull through the vertex without intravenous contrast. COMPARISON:  April 07, 2020. FINDINGS: Brain: No evidence of acute infarction, hemorrhage, hydrocephalus, extra-axial collection or mass lesion/mass effect. Vascular: No hyperdense vessel or unexpected calcification. Skull: Normal. Negative for fracture or focal lesion. Sinuses/Orbits: No acute finding. Other: None. IMPRESSION: No acute  intracranial abnormality seen. Electronically Signed   By: Marijo Conception M.D.   On: 07/30/2020 10:31   CT RENAL STONE STUDY  Result Date: 07/30/2020 CLINICAL DATA:  Flank pain.  Sepsis. EXAM: CT ABDOMEN AND PELVIS  WITHOUT CONTRAST TECHNIQUE: Multidetector CT imaging of the abdomen and pelvis was performed following the standard protocol without IV contrast. COMPARISON:  05/12/2020 FINDINGS: Lower chest: No acute abnormality. Hepatobiliary: Diffuse hepatic steatosis identified. No focal liver abnormality. Gallbladder normal. No bile duct dilatation. Pancreas: Unremarkable. No pancreatic ductal dilatation or surrounding inflammatory changes. Spleen: Normal in size without focal abnormality. Adrenals/Urinary Tract: Normal adrenal glands. Since the previous exam there is been interval placement of a left-sided Double-J nephroureteral stent. The left kidney stone noted on previous exam is no longer visualized. Progressive left-sided perinephric fat stranding. No hydronephrosis. Left-sided Peri ureteral soft tissue stranding is also noted to the level of the bladder. Normal right kidney. No kidney stone or hydronephrosis. Urinary bladder is unremarkable. Stomach/Bowel: Small to moderate hiatal hernia. Status post appendectomy. No bowel wall thickening, inflammation, or distension. Distal colonic diverticula identified without signs of acute inflammation. Vascular/Lymphatic: Aortic atherosclerosis. No aneurysm. No abdominopelvic adenopathy. Reproductive: Uterus and bilateral adnexa are unremarkable. Other: No focal fluid collections identified. Trace fluid is identified tracking along the left posterior perinephric space of the retroperitoneum, new from previous exam. Musculoskeletal: No acute or suspicious osseous abnormality. Thoracolumbar degenerative disc disease is most advanced at T12-L1 and L5-S1. IMPRESSION: 1. Interval placement of left-sided Double-J nephroureteral stent. The left kidney stone noted on previous exam is no longer visualized. No hydronephrosis. 2. Progressive left-sided perinephric and periureteral soft tissue stranding is identified. Correlate for any clinical signs or symptoms of left-sided pyelonephritis.  3. Hepatic steatosis. 4. Hiatal hernia. 5. Aortic atherosclerosis. Aortic Atherosclerosis (ICD10-I70.0). Electronically Signed   By: Kerby Moors M.D.   On: 07/30/2020 14:43     Labs:   Basic Metabolic Panel: Recent Labs  Lab 07/30/20 0921 07/31/20 0748 08/01/20 0524 08/02/20 0544 08/03/20 0455  NA 133* 137 137 140 142  K 3.5 3.7 3.4* 3.5 3.7  CL 100 101 107 110 110  CO2 22 25 24 23 25   GLUCOSE 172* 94 101* 120* 110*  BUN 20 21 18 19 16   CREATININE 1.30* 1.17* 0.97 0.90 0.85  CALCIUM 8.0* 8.2* 8.0* 8.3* 8.3*  MG  --   --  1.9  --   --   PHOS  --   --  3.4  --   --    GFR Estimated Creatinine Clearance: 75.1 mL/min (by C-G formula based on SCr of 0.85 mg/dL). Liver Function Tests: Recent Labs  Lab 07/30/20 0921  AST 26  ALT 10  ALKPHOS 78  BILITOT 1.2  PROT 7.2  ALBUMIN 3.2*   No results for input(s): LIPASE, AMYLASE in the last 168 hours. No results for input(s): AMMONIA in the last 168 hours. Coagulation profile No results for input(s): INR, PROTIME in the last 168 hours.  CBC: Recent Labs  Lab 07/30/20 0921 07/31/20 0748 08/01/20 0524 08/02/20 0544 08/03/20 0455  WBC 19.2* 12.0* 7.6 6.8 7.5  NEUTROABS 17.1*  --  5.4 4.7 5.1  HGB 10.9* 10.4* 9.3* 9.2* 9.2*  HCT 33.8* 31.9* 27.9* 28.1* 27.7*  MCV 93.6 90.6 89.4 89.5 88.8  PLT 179 159 169 187 216   Cardiac Enzymes: No results for input(s): CKTOTAL, CKMB, CKMBINDEX, TROPONINI in the last 168 hours. BNP: Invalid input(s): POCBNP CBG: Recent Labs  Lab 07/31/20 0108  GLUCAP 98   D-Dimer No results for input(s): DDIMER in the last 72 hours. Hgb A1c No results for input(s): HGBA1C in the last 72 hours. Lipid Profile No results for input(s): CHOL, HDL, LDLCALC, TRIG, CHOLHDL, LDLDIRECT in the last 72 hours. Thyroid function studies No results for input(s): TSH, T4TOTAL, T3FREE, THYROIDAB in the last 72 hours.  Invalid input(s): FREET3 Anemia work up No results for input(s): VITAMINB12, FOLATE,  FERRITIN, TIBC, IRON, RETICCTPCT in the last 72 hours. Microbiology Recent Results (from the past 240 hour(s))  Culture, blood (routine x 2)     Status: None (Preliminary result)   Collection Time: 07/30/20  9:20 AM   Specimen: Right Antecubital; Blood  Result Value Ref Range Status   Specimen Description RIGHT ANTECUBITAL  Final   Special Requests   Final    Blood Culture adequate volume BOTTLES DRAWN AEROBIC AND ANAEROBIC   Culture   Final    NO GROWTH 4 DAYS Performed at [facility], [address], [place], [place] [address]    Report Status PENDING  Incomplete  Resp Panel by RT-PCR (Flu A&B, Covid) Nasopharyngeal Swab     Status: None   Collection Time: 07/30/20  9:21 AM   Specimen: Nasopharyngeal Swab; Nasopharyngeal(NP) swabs in vial transport medium  Result Value Ref Range Status   SARS Coronavirus 2 by RT PCR NEGATIVE NEGATIVE Final    Comment: (NOTE) SARS-CoV-2 target nucleic acids are NOT DETECTED.  The SARS-CoV-2 RNA is generally detectable in upper respiratory specimens during the acute phase of infection. The lowest concentration of SARS-CoV-2 viral copies this assay can detect is 138 copies/mL. A negative result does not preclude SARS-Cov-2 infection and should not be used as the sole basis for treatment or other patient management decisions. A negative result may occur with  improper specimen collection/handling, submission of specimen other than nasopharyngeal swab, presence of viral mutation(s) within the areas targeted by this assay, and inadequate number of viral copies(<138 copies/mL). A negative result must be combined with clinical observations, patient history, and epidemiological information. The expected result is Negative.  Fact Sheet for Patients:  EntrepreneurPulse.com.au  Fact Sheet for Healthcare Providers:  IncredibleEmployment.be  This test is no t yet approved or cleared by the [place] FDA  and  has been authorized for detection and/or diagnosis of SARS-CoV-2 by FDA under an Emergency Use Authorization (EUA). This EUA will remain  in effect (meaning this test can be used) for the duration of the COVID-19 declaration under Section 564(b)(1) of the Act, 21 U.S.C.section 360bbb-3(b)(1), unless the authorization is terminated  or revoked sooner.       Influenza A by PCR NEGATIVE NEGATIVE Final   Influenza B by PCR NEGATIVE NEGATIVE Final    Comment: (NOTE) The Xpert Xpress SARS-CoV-2/FLU/RSV plus assay is intended as an aid in the diagnosis of influenza from Nasopharyngeal swab specimens and should not be used as a sole basis for treatment. Nasal washings and aspirates are unacceptable for Xpert Xpress SARS-CoV-2/FLU/RSV testing.  Fact Sheet for Patients: EntrepreneurPulse.com.au  Fact Sheet for Healthcare Providers: IncredibleEmployment.be  This test is not yet approved or cleared by the [place] FDA and has been authorized for detection and/or diagnosis of SARS-CoV-2 by FDA under an Emergency Use Authorization (EUA). This EUA will remain in effect (meaning this test can be used) for the duration of the COVID-19 declaration under Section 564(b)(1) of the Act, 21 U.S.C. section 360bbb-3(b)(1), unless the authorization is terminated or revoked.  Performed at [facility], [address] [place], [place] [address]   Culture, blood (routine x 2)     Status: None (Preliminary result)   Collection Time: 07/30/20  9:21 AM   Specimen: BLOOD RIGHT ARM  Result Value Ref Range Status   Specimen Description BLOOD RIGHT ARM  Final   Special Requests   Final    Blood Culture adequate volume BOTTLES DRAWN AEROBIC AND ANAEROBIC   Culture   Final    NO GROWTH 4 DAYS Performed at [facility], [address] [place], [place] [address]    Report Status PENDING  Incomplete  Urine culture     Status: Abnormal    Collection Time: 07/30/20  2:19 PM   Specimen: Urine, Random  Result Value Ref Range Status   Specimen Description   Final    URINE, RANDOM Performed at [facility], [address] [place], [place] [address]    Special Requests   Final    NONE Performed at [facility], [address] [place], [place] [address]    Culture MULTIPLE SPECIES PRESENT, SUGGEST RECOLLECTION (A)  Final   Report Status 08/01/2020 FINAL  Final  Gastrointestinal Panel by PCR , Stool     Status: None   Collection Time: 08/02/20  9:21 AM   Specimen: Stool  Result Value Ref Range Status   Campylobacter species NOT DETECTED NOT DETECTED Final   Plesimonas shigelloides NOT DETECTED NOT DETECTED Final   Salmonella species NOT DETECTED NOT DETECTED Final   Yersinia enterocolitica NOT DETECTED NOT DETECTED Final   Vibrio species NOT DETECTED NOT DETECTED Final   Vibrio cholerae NOT DETECTED NOT DETECTED Final   Enteroaggregative E coli (EAEC) NOT DETECTED NOT DETECTED Final   Enteropathogenic E coli (EPEC) NOT DETECTED NOT DETECTED Final   Enterotoxigenic E coli (ETEC) NOT DETECTED NOT DETECTED Final   Shiga like toxin producing E coli (STEC) NOT DETECTED NOT DETECTED Final   Shigella/Enteroinvasive E coli (EIEC) NOT DETECTED NOT DETECTED Final   Cryptosporidium NOT DETECTED NOT DETECTED Final   Cyclospora cayetanensis NOT DETECTED NOT DETECTED Final   Entamoeba histolytica NOT DETECTED NOT DETECTED Final   Giardia lamblia NOT DETECTED NOT DETECTED Final   Adenovirus F40/41 NOT DETECTED NOT DETECTED Final   Astrovirus NOT DETECTED NOT DETECTED Final   Norovirus GI/GII NOT DETECTED NOT DETECTED Final   Rotavirus A NOT DETECTED NOT DETECTED Final   Sapovirus (I, II, IV, and V) NOT DETECTED NOT DETECTED Final    Comment: Performed at [facility], [address] [place], [place] [address]  C Difficile Quick Screen w PCR reflex     Status: None   Collection Time: 08/02/20  9:21 AM    Specimen: STOOL  Result Value Ref Range Status   C Diff antigen NEGATIVE NEGATIVE Final   C Diff toxin NEGATIVE NEGATIVE Final   C Diff interpretation No C. difficile detected.  Final    Comment: NEGATIVE Performed at [facility], [address] [place], [place] [address]      Signed: Terrilee Croak  [facility] 08/03/2020, 10:34 AM

## 2020-08-04 LAB — CULTURE, BLOOD (ROUTINE X 2)
Culture: NO GROWTH
Culture: NO GROWTH
Special Requests: ADEQUATE
Special Requests: ADEQUATE

## 2020-08-08 ENCOUNTER — Other Ambulatory Visit: Payer: Self-pay | Admitting: Urology

## 2020-08-09 ENCOUNTER — Ambulatory Visit (INDEPENDENT_AMBULATORY_CARE_PROVIDER_SITE_OTHER): Payer: Medicare HMO | Admitting: Urology

## 2020-08-09 ENCOUNTER — Encounter: Payer: Self-pay | Admitting: Urology

## 2020-08-09 ENCOUNTER — Other Ambulatory Visit: Payer: Self-pay

## 2020-08-09 VITALS — BP 108/71 | HR 83 | Ht 65.0 in | Wt 228.0 lb

## 2020-08-09 DIAGNOSIS — N200 Calculus of kidney: Secondary | ICD-10-CM | POA: Diagnosis not present

## 2020-08-09 DIAGNOSIS — N6489 Other specified disorders of breast: Secondary | ICD-10-CM

## 2020-08-09 MED ORDER — LEVOFLOXACIN 500 MG PO TABS
500.0000 mg | ORAL_TABLET | Freq: Once | ORAL | Status: AC
  Administered 2020-08-09: 500 mg via ORAL

## 2020-08-09 NOTE — Progress Notes (Signed)
Indications: 70 y.o. female s/p ureteroscopic removal of a 7 mm left renal calculus 07/27/2020.  Admitted 7/1-7/5 for weakness, diarrhea and was treated for UTI though urine culture grew multiple species.  C. difficile was negative.  The patient is presenting today for stent removal.  Procedure:  Flexible Cystoscopy with stent removal (03496)  Timeout was performed and the correct patient, procedure and participants were identified.    Description:  The patient was prepped and draped in the usual sterile fashion. Flexible cystosopy was performed.  The stent was visualized, grasped, and removed intact without difficulty. The patient tolerated the procedure well.  A single dose of oral antibiotics was given.  Complications:  None  Plan:  Instructed to call for fever/flank pain post stent removal Stone analysis 100% CaOxMono Follow-up 1 month for UA and discussion of metabolic evaluation versus general stone prevention guidelines   [name], MD

## 2020-08-30 LAB — URINALYSIS, COMPLETE
Bilirubin, UA: POSITIVE — AB
Glucose, UA: NEGATIVE
Nitrite, UA: NEGATIVE
Specific Gravity, UA: 1.03 (ref 1.005–1.030)
Urobilinogen, Ur: 0.2 mg/dL (ref 0.2–1.0)
pH, UA: 5.5 (ref 5.0–7.5)

## 2020-09-02 ENCOUNTER — Other Ambulatory Visit: Payer: Self-pay | Admitting: Urology

## 2020-09-05 ENCOUNTER — Other Ambulatory Visit: Payer: Self-pay | Admitting: Urology

## 2020-09-10 ENCOUNTER — Ambulatory Visit: Payer: Medicare HMO | Admitting: Urology

## 2020-09-10 ENCOUNTER — Other Ambulatory Visit: Payer: Self-pay

## 2020-09-10 ENCOUNTER — Encounter: Payer: Self-pay | Admitting: Urology

## 2020-09-10 VITALS — BP 115/72 | HR 81 | Ht 66.0 in | Wt 230.4 lb

## 2020-09-10 DIAGNOSIS — R3129 Other microscopic hematuria: Secondary | ICD-10-CM

## 2020-09-10 DIAGNOSIS — N3281 Overactive bladder: Secondary | ICD-10-CM | POA: Diagnosis not present

## 2020-09-10 DIAGNOSIS — N390 Urinary tract infection, site not specified: Secondary | ICD-10-CM | POA: Diagnosis not present

## 2020-09-10 DIAGNOSIS — N200 Calculus of kidney: Secondary | ICD-10-CM

## 2020-09-10 LAB — URINALYSIS, COMPLETE
Bilirubin, UA: NEGATIVE
Glucose, UA: NEGATIVE
Leukocytes,UA: NEGATIVE
Nitrite, UA: NEGATIVE
RBC, UA: NEGATIVE
Specific Gravity, UA: >1.03 — ABNORMAL HIGH (ref 1.005–1.030)
Urobilinogen, Ur: 0.2 mg/dL (ref 0.2–1.0)
pH, UA: 5 (ref 5.0–7.5)

## 2020-09-10 LAB — MICROSCOPIC EXAMINATION

## 2020-09-10 MED ORDER — MIRABEGRON ER 25 MG PO TB24: 25.0000 mg | ORAL_TABLET | Freq: Every day | ORAL | 0 refills | Status: DC

## 2020-09-10 NOTE — Patient Instructions (Signed)
Mirabegron Extended-release Oral Tablets What is this medication? MIRABEGRON (MIR a BEG ron) is used to treat overactive bladder. This medicine reduces the amount of bathroom visits. It may also help to control wettingaccidents. This medicine may be used for other purposes; ask your health care provider orpharmacist if you have questions. COMMON BRAND NAME(S): Myrbetriq What should I tell my care team before I take this medication? They need to know if you have any of these conditions: high blood pressure kidney disease liver disease problems urinating prostate disease an unusual or allergic reaction to mirabegron, other medicines, foods, dyes, or preservatives pregnant or trying to get pregnant breast-feeding How should I use this medication? Take this medicine by mouth with water. Take it as directed on the prescription label at the same time every day. Do not cut, crush or chew this medicine. Swallow the tablets whole. Adults can take it with or without food. Children should take it with food. Keep taking it unless your health care provider tellsyou to stop. Talk to your health care provider about the use of this medicine in children. While it may be prescribed for children as young as 3 years for selectedconditions, precautions do apply. Overdosage: If you think you have taken too much of this medicine contact apoison control center or emergency room at once. NOTE: This medicine is only for you. Do not share this medicine with others. What if I miss a dose? If you miss a dose, take it as soon as you can unless it is more than 12 hours late. If it is more than 12 hours late, skip the missed dose. Take the nextdose at the normal time. What may interact with this medication? codeine desipramine digoxin flecainide MAOIs like Carbex, Eldepryl, Marplan, Nardil, and Parnate methadone metoprolol pimozide propafenone thioridazine warfarin This list may not describe all possible  interactions. Give your health care provider a list of all the medicines, herbs, non-prescription drugs, or dietary supplements you use. Also tell them if you smoke, drink alcohol, or use illegaldrugs. Some items may interact with your medicine. What should I watch for while using this medication? Visit your health care provider for regular checks on your progress. Tell your health care provider if your symptoms do not start to get better or if they get worse. Check your blood pressure as directed. Ask your doctor or health care professional what your blood pressure should be and when you should contact himor her. What side effects may I notice from receiving this medication? Side effects that you should report to your doctor or health care professionalas soon as possible: allergic reactions (skin rash, itching or hives; swelling of the face, lips, or tongue) increase in blood pressure fast, irregular heartbeat infection (fever, chills, pain or trouble passing urine) trouble passing urine Side effects that usually do not require medical attention (report these toyour doctor or health care professional if they continue or are bothersome): constipation dry mouth headache nausea sore throat This list may not describe all possible side effects. Call your doctor for medical advice about side effects. You may report side effects to FDA at1-800-FDA-1088. Where should I keep my medication? Keep out of the reach of children and pets. Store at room temperature between 20 and 25 degrees C (68 and 77 degrees F).Get rid of any unused medicine after the expiration date. To get rid of medicines that are no longer needed or have expired: Take the medicine to a medicine take-back program. Check with your pharmacy or law  enforcement to find a location. If you cannot return the medicine, check the label or package insert to see if the medicine should be thrown out in the garbage or flushed down the toilet. If you  are not sure, ask your health care provider. If it is safe to put it in the trash, empty the medicine out of the container. Mix the medicine with cat litter, dirt, coffee grounds, or other unwanted substance. Seal the mixture in a bag or container. Put it in the trash. NOTE: This sheet is a summary. It may not cover all possible information. If you have questions about this medicine, talk to your doctor, pharmacist, orhealth care provider.  2022 Elsevier/Gold Standard (2019-10-17 09:56:57)

## 2020-09-10 NOTE — Progress Notes (Signed)
09/10/2020 10:17 AM   Tonya Walton Mar 10, 1950 [id number]  Referring provider: Rusty Aus, MD [place] [facility] [place],  [place] [address]  Chief Complaint  Patient presents with   Follow-up    HPI: 70 y.o. female presents for postop follow-up  URS 07/27/2020 7 mm left renal calculus Stent removed 08/09/2020 Stone analysis CaOxMono 100% CT showed no additional calculi; no prior history stone disease Since stent removal she has done well.  She is on extended release trospium for overactive bladder symptoms including frequency, urgency and urge incontinence Denies gross hematuria   PMH: Past Medical History:  Diagnosis Date   Anxiety    Arthritis    Atrial fibrillation (HCC)    CHF (congestive heart failure) (HCC)    COPD (chronic obstructive pulmonary disease) (HCC)    Coronary artery disease    Depression    Family history of adverse reaction to anesthesia    brothers had a lung collaspe during an unknown procedure.   GERD (gastroesophageal reflux disease)    Headache    History of kidney stones    Hx of CABG 04/01/2015   Single vessel; LIMA-LAD   Hypertension    Hypothyroidism    Insomnia    Invasive ductal carcinoma of breast, female, right ([place]) 09/27/2010   Stage 1A; ER/PR (+) and HER2/neu indeterminant; pT1b pN0 (sn)   Neuromuscular disorder (HCC)    nerve damage in legs   Personal history of radiation therapy 2012   BREAST CA   Restless leg syndrome    Seizures (HCC)    Shortness of breath dyspnea    Sjogren's syndrome (HCC)    Stroke (HCC)    Some mild weakness in left side residual    Surgical History: Past Surgical History:  Procedure Laterality Date   APPENDECTOMY     BREAST BIOPSY Left 07/2013   NEG   BREAST BIOPSY Right 08/22/2016   benign   BREAST BIOPSY Right 01/07/2019   asymmetry bx with stereo, coil marker,fat necrosis   BREAST BIOPSY Right 01/07/2019   asymm affirm bx,  x marker,  fat  necrosis   BREAST BIOPSY Right 03/25/2020   stereo bx, ribbon clip, path pending    BREAST EXCISIONAL BIOPSY Right 2012   POS   BREAST LUMPECTOMY  2012   Right breast   CARDIAC CATHETERIZATION N/A 03/02/2015   Procedure: Left Heart Cath and Coronary Angiography;  Surgeon: Teodoro Spray, MD;  Location: [facility];  Service: Cardiovascular;  Laterality: N/A;   CATARACT EXTRACTION W/PHACO Right 10/14/2014   Procedure: CATARACT EXTRACTION PHACO AND INTRAOCULAR LENS PLACEMENT (IOC);  Surgeon: Leandrew Koyanagi, MD;  Location: [facility];  Service: Ophthalmology;  Laterality: Right;   COLONOSCOPY     CORONARY ARTERY BYPASS GRAFT N/A 04/01/2015   Procedure: CORONARY ARTERY BYPASS GRAFTING (CABG) x one , using left mammary artery;  Surgeon: Ivin Poot, MD;  Location: [facility];  Service: Open Heart Surgery;  Laterality: N/A;   CYSTOSCOPY/URETEROSCOPY/HOLMIUM LASER/STENT PLACEMENT Left 07/06/2020   Procedure: CYSTOSCOPY/URETEROSCOPY/STENT PLACEMENT/ retrograde pyelogram;  Surgeon: Abbie Sons, MD;  Location: [facility];  Service: Urology;  Laterality: Left;   CYSTOSCOPY/URETEROSCOPY/HOLMIUM LASER/STENT PLACEMENT Left 07/27/2020   Procedure: CYSTOSCOPY/URETEROSCOPY/HOLMIUM LASER/STENT EXCHANGE;  Surgeon: Abbie Sons, MD;  Location: [facility];  Service: Urology;  Laterality: Left;   JOINT REPLACEMENT Right    Total Knee Replacement   TEE WITHOUT CARDIOVERSION N/A 04/01/2015   Procedure: TRANSESOPHAGEAL ECHOCARDIOGRAM (TEE);  Surgeon: Ivin Poot, MD;  Location: [facility];  Service: Open Heart Surgery;  Laterality: N/A;   TONSILLECTOMY     TOTAL KNEE ARTHROPLASTY Right 06/10/2014   Procedure: TOTAL KNEE ARTHROPLASTY;  Surgeon: Dereck Leep, MD;  Location: [facility];  Service: Orthopedics;  Laterality: Right;   TOTAL KNEE ARTHROPLASTY Left 09/02/2014   Procedure: TOTAL KNEE ARTHROPLASTY;  Surgeon: Dereck Leep, MD;  Location: [facility];  Service: Orthopedics;  Laterality: Left;     Home Medications:  Allergies as of 09/10/2020   No Known Allergies      Medication List        Accurate as of September 10, 2020 10:17 AM. If you have any questions, ask your nurse or doctor.          STOP taking these medications    tolterodine 4 MG 24 hr capsule Commonly known as: DETROL LA Stopped by: Abbie Sons, MD   Trospium Chloride 60 MG Cp24 Stopped by: Abbie Sons, MD       TAKE these medications    allopurinol 100 MG tablet Commonly known as: ZYLOPRIM Take by mouth.   aspirin EC 81 MG tablet Take 81 mg by mouth daily.   atorvastatin 80 MG tablet Commonly known as: LIPITOR Take 80 mg by mouth every morning.   B-1 500 MG Tabs Take 500 mg by mouth daily.   carboxymethylcellul-glycerin 0.5-0.9 % ophthalmic solution Commonly known as: REFRESH OPTIVE Apply to eye.   celecoxib 200 MG capsule Commonly known as: CELEBREX Take 200 mg by mouth 2 (two) times daily.   clonazePAM 1 MG tablet Commonly known as: KLONOPIN every evening.   colchicine 0.6 MG tablet Take 0.6 mg by mouth 2 (two) times daily as needed (gout).   cyclobenzaprine 10 MG tablet Commonly known as: FLEXERIL Take 10 mg by mouth daily as needed.   donepezil 5 MG tablet Commonly known as: ARICEPT Take 5 mg by mouth at bedtime.   FLUoxetine 40 MG capsule Commonly known as: PROZAC Take 40 mg by mouth daily.   HYDROcodone-acetaminophen 5-325 MG tablet Commonly known as: NORCO/VICODIN Take 1 tablet by mouth every 6 (six) hours as needed for moderate pain.   hydroxychloroquine 200 MG tablet Commonly known as: PLAQUENIL Take 200 mg by mouth 2 (two) times daily.   lamoTRIgine 25 MG tablet Commonly known as: LAMICTAL TAKE 2 TABLETS BY MOUTH EVERY DAY What changed:  how much to take when to take this   levothyroxine 25 MCG tablet Commonly known as: SYNTHROID Take 25 mcg by mouth daily before breakfast.   meloxicam 15 MG tablet Commonly known as: MOBIC Take 15 mg  by mouth daily.   mirabegron ER 25 MG Tb24 tablet Commonly known as: MYRBETRIQ Take 1 tablet (25 mg total) by mouth daily. Started by: Abbie Sons, MD   montelukast 10 MG tablet Commonly known as: SINGULAIR Take 10 mg by mouth at bedtime.   pantoprazole 40 MG tablet Commonly known as: PROTONIX Take 40 mg by mouth as needed.   pantoprazole 40 MG tablet Commonly known as: PROTONIX Take 1 tablet by mouth daily.   sucralfate 1 g tablet Commonly known as: CARAFATE Take 1 g by mouth 4 (four) times daily as needed (heartburn).   tamsulosin 0.4 MG Caps capsule Commonly known as: FLOMAX TAKE 1 CAPSULE (0.4 MG TOTAL) BY MOUTH DAILY AFTER BREAKFAST.   triamcinolone cream 0.1 % Commonly known as: KENALOG Apply topically.   vitamin B-12 1000 MCG tablet  Commonly known as: CYANOCOBALAMIN Take 1,000 mcg by mouth daily.   VITAMIN B-2 PO Take 400 mg by mouth daily.   Cyto B2 343 MG/GM Powd Generic drug: Riboflavin Take by mouth.   Vitamin D3 50 MCG (2000 UT) capsule Take 2,000 Units by mouth daily.   zolpidem 5 MG tablet Commonly known as: AMBIEN Take 5 mg by mouth at bedtime.        Allergies: No Known Allergies  Family History: Family History  Problem Relation Age of Onset   Leukemia Mother    Bone cancer Father    Heart disease Brother    Heart disease Brother    Diabetes Brother    Hypertension Brother    Diabetes Brother    Hypertension Brother    Heart disease Brother    Breast cancer Paternal Aunt     Social History:  reports that she quit smoking about 28 years ago. Her smoking use included cigarettes. She smoked an average of 1 pack per day. She has never used smokeless tobacco. She reports that she does not drink alcohol and does not use drugs.   Physical Exam: BP 115/72 (BP Location: Left Arm, Patient Position: Sitting, Cuff Size: Large)   Pulse 81   Ht 5' 6"  (1.676 m)   Wt 230 lb 6.4 oz (104.5 kg)   BMI 37.19 kg/m   Constitutional:  Alert  and oriented, No acute distress. HEENT: [place] AT, moist mucus membranes.  Trachea midline, no masses. Cardiovascular: No clubbing, cyanosis, or edema. Respiratory: Normal respiratory effort, no increased work of breathing.   Laboratory Data:  Urinalysis Microscopy 0-5 WBC/3-2 RBC  Assessment & Plan:    1.  Personal history urinary calculi Doing well status post ureteroscopic stone removal She is a first-time stone former and had no other calculi on CT We discussed general stone prevention guidelines including increasing water intake to keep urine output >2.5 L/day and dietary oxalate moderation.  She was given stone prevention literature.  Was offered a metabolic evaluation but declined Follow-up 6 months with [facility]  2.  Overactive bladder Trial Myrbetriq 25 mg daily She will hold her trospium and if Myrbetriq more effective will call back for an Rx  3.  Recurrent UTI Asymptomatic at present   Return in about 6 months (around 03/13/2021) for Follow up w/ [facility] prior .   Abbie Sons, [place] [address] [place], [place] [address] [phone]

## 2020-09-11 ENCOUNTER — Encounter: Payer: Self-pay | Admitting: Urology

## 2020-09-22 ENCOUNTER — Other Ambulatory Visit: Payer: Self-pay | Admitting: Urology

## 2020-10-25 ENCOUNTER — Other Ambulatory Visit: Payer: Self-pay | Admitting: Family Medicine

## 2020-12-06 ENCOUNTER — Other Ambulatory Visit: Payer: Self-pay | Admitting: *Deleted

## 2020-12-06 ENCOUNTER — Other Ambulatory Visit: Payer: Self-pay

## 2020-12-06 ENCOUNTER — Other Ambulatory Visit
Admission: RE | Admit: 2020-12-06 | Discharge: 2020-12-06 | Disposition: A | Discharge status: Home / Self Care | Payer: Medicare HMO | Attending: Urology | Admitting: Urology

## 2020-12-06 ENCOUNTER — Telehealth: Payer: Self-pay | Admitting: Urology

## 2020-12-06 ENCOUNTER — Encounter: Payer: Self-pay | Admitting: Urology

## 2020-12-06 ENCOUNTER — Ambulatory Visit: Payer: Medicare HMO | Admitting: Urology

## 2020-12-06 VITALS — BP 143/74 | HR 84 | Ht 66.0 in | Wt 223.0 lb

## 2020-12-06 DIAGNOSIS — N952 Postmenopausal atrophic vaginitis: Secondary | ICD-10-CM | POA: Diagnosis not present

## 2020-12-06 DIAGNOSIS — N390 Urinary tract infection, site not specified: Secondary | ICD-10-CM | POA: Insufficient documentation

## 2020-12-06 DIAGNOSIS — R300 Dysuria: Secondary | ICD-10-CM | POA: Diagnosis not present

## 2020-12-06 DIAGNOSIS — N3941 Urge incontinence: Secondary | ICD-10-CM | POA: Diagnosis not present

## 2020-12-06 LAB — URINALYSIS, COMPLETE (UACMP) WITH MICROSCOPIC
Glucose, UA: NEGATIVE mg/dL
Hgb urine dipstick: NEGATIVE
Nitrite: POSITIVE — AB
Protein, ur: 30 mg/dL — AB
Specific Gravity, Urine: >1.03 — ABNORMAL HIGH (ref 1.005–1.030)
WBC, UA: >50 WBC/hpf (ref 0–5)
pH: 6 (ref 5.0–8.0)

## 2020-12-06 LAB — BLADDER SCAN AMB NON-IMAGING

## 2020-12-06 MED ORDER — SULFAMETHOXAZOLE-TRIMETHOPRIM 800-160 MG PO TABS
1.0000 | ORAL_TABLET | Freq: Two times a day (BID) | ORAL | 0 refills | Status: DC
Start: 2020-12-06 — End: 2021-04-08

## 2020-12-06 MED ORDER — ESTRADIOL 0.1 MG/GM VA CREA: TOPICAL_CREAM | VAGINAL | 12 refills | Status: DC

## 2020-12-06 NOTE — Telephone Encounter (Signed)
Please let Tonya Walton know that she does not need to drop by and given another urine.  They were able to do a urinalysis and send it for culture with the specimen given.  The urinalysis was consistent for infection so I have sent in sulfamethoxazole/trimethoprim DS, twice daily for 7 days to her pharmacy.

## 2020-12-06 NOTE — Patient Instructions (Signed)
Using your finger-tip apply a pea-sized amount of the Premarin cream to the external vaginal area every night for two weeks and then on Monday, Wednesday and Friday nights

## 2020-12-06 NOTE — Progress Notes (Signed)
12/06/2020 12:09 PM   Tonya Walton 1950-08-31 [id number]  Referring provider: Rusty Aus, MD [facility] [place],  [place] [address]  Chief Complaint  Patient presents with   Urinary Tract Infection   Urological history: 1. Nephrolithiasis -stone composition of CaOxMono 100% -left URS in 2022  2. rUTI's -contributing factors of age, vaginal atrophy, incontinence,  -documented positive urine cultures over the last year  07/30/2020 Multiple species  03/17/2020 Enterococcus faecalis  12/09/2019 E. Coli  -UA nitrite positive, > 50 WBC's, many bacteria, mucus presents and WBC clumps   3. High risk hematuria -former smoker -CTU 2022 - NED -cysto 2022 - NED - no reports of gross hematuria -UA negative micro heme  4. Urge incontinence -contributing factors of age, vaginal atrophy, depression, COPD, HTN, CVA, neuromuscular disorder, obesity, benzo's and OAB agent -managed with incontinence pads   5. OAB -contributing factors of age, vaginal atrophy, depression, CVA and neuromuscular disorder -managed with oxybutynin XL 10 mg daily     HPI: Tonya Walton is a 70 y.o. female who presents today for pressure, burning and pain.   She states about a week ago she started to burning.  She states the burning occurs with and without urination.  She continues to have frequency, urgency, urge incontinence and a weak urinary stream.  She is currently on oxybutynin XL 10 mg daily through her PCP as the other overactive bladder agents were cost prohibitive.  Patient denies any modifying or aggravating factors.  Patient denies any gross hematuria or suprapubic/flank pain.  Patient denies any fevers, chills, nausea or vomiting.    She states she drinks a lot of water and this morning she has had three 8 ounces glasses of water, but she is unable to urinate.  She states the last time she urinated was yesterday.    Bladder scan was 0 mL.   I cathed her today to obtain and specimen and to confirm the scanner was working properly.   Cath PVR < 20 mL.  UA is nitrite positive, with greater than 50 WBCs, many bacteria and WBC casts.  She denies any pedal edema, but she has had a cough for the last week.      PMH: Past Medical History:  Diagnosis Date   Anxiety    Arthritis    Atrial fibrillation (HCC)    CHF (congestive heart failure) (HCC)    COPD (chronic obstructive pulmonary disease) (HCC)    Coronary artery disease    Depression    Family history of adverse reaction to anesthesia    brothers had a lung collaspe during an unknown procedure.   GERD (gastroesophageal reflux disease)    Headache    History of kidney stones    Hx of CABG 04/01/2015   Single vessel; LIMA-LAD   Hypertension    Hypothyroidism    Insomnia    Invasive ductal carcinoma of breast, female, right ([place]) 09/27/2010   Stage 1A; ER/PR (+) and HER2/neu indeterminant; pT1b pN0 (sn)   Neuromuscular disorder (HCC)    nerve damage in legs   Personal history of radiation therapy 2012   BREAST CA   Restless leg syndrome    Seizures (HCC)    Shortness of breath dyspnea    Sjogren's syndrome (HCC)    Stroke (HCC)    Some mild weakness in left side residual    Surgical History: Past Surgical History:  Procedure Laterality Date   APPENDECTOMY  BREAST BIOPSY Left 07/2013   NEG   BREAST BIOPSY Right 08/22/2016   benign   BREAST BIOPSY Right 01/07/2019   asymmetry bx with stereo, coil marker,fat necrosis   BREAST BIOPSY Right 01/07/2019   asymm affirm bx,  x marker,  fat necrosis   BREAST BIOPSY Right 03/25/2020   stereo bx, ribbon clip, path pending    BREAST EXCISIONAL BIOPSY Right 2012   POS   BREAST LUMPECTOMY  2012   Right breast   CARDIAC CATHETERIZATION N/A 03/02/2015   Procedure: Left Heart Cath and Coronary Angiography;  Surgeon: Teodoro Spray, MD;  Location: [facility];  Service: Cardiovascular;  Laterality: N/A;    CATARACT EXTRACTION W/PHACO Right 10/14/2014   Procedure: CATARACT EXTRACTION PHACO AND INTRAOCULAR LENS PLACEMENT (IOC);  Surgeon: Leandrew Koyanagi, MD;  Location: [facility];  Service: Ophthalmology;  Laterality: Right;   COLONOSCOPY     CORONARY ARTERY BYPASS GRAFT N/A 04/01/2015   Procedure: CORONARY ARTERY BYPASS GRAFTING (CABG) x one , using left mammary artery;  Surgeon: Ivin Poot, MD;  Location: [facility];  Service: Open Heart Surgery;  Laterality: N/A;   CYSTOSCOPY/URETEROSCOPY/HOLMIUM LASER/STENT PLACEMENT Left 07/06/2020   Procedure: CYSTOSCOPY/URETEROSCOPY/STENT PLACEMENT/ retrograde pyelogram;  Surgeon: Abbie Sons, MD;  Location: [facility];  Service: Urology;  Laterality: Left;   CYSTOSCOPY/URETEROSCOPY/HOLMIUM LASER/STENT PLACEMENT Left 07/27/2020   Procedure: CYSTOSCOPY/URETEROSCOPY/HOLMIUM LASER/STENT EXCHANGE;  Surgeon: Abbie Sons, MD;  Location: [facility];  Service: Urology;  Laterality: Left;   JOINT REPLACEMENT Right    Total Knee Replacement   TEE WITHOUT CARDIOVERSION N/A 04/01/2015   Procedure: TRANSESOPHAGEAL ECHOCARDIOGRAM (TEE);  Surgeon: Ivin Poot, MD;  Location: [facility];  Service: Open Heart Surgery;  Laterality: N/A;   TONSILLECTOMY     TOTAL KNEE ARTHROPLASTY Right 06/10/2014   Procedure: TOTAL KNEE ARTHROPLASTY;  Surgeon: Dereck Leep, MD;  Location: [facility];  Service: Orthopedics;  Laterality: Right;   TOTAL KNEE ARTHROPLASTY Left 09/02/2014   Procedure: TOTAL KNEE ARTHROPLASTY;  Surgeon: Dereck Leep, MD;  Location: [facility];  Service: Orthopedics;  Laterality: Left;    Home Medications:  Allergies as of 12/06/2020   No Known Allergies      Medication List        Accurate as of December 06, 2020 12:09 PM. If you have any questions, ask your nurse or doctor.          allopurinol 100 MG tablet Commonly known as: ZYLOPRIM Take by mouth.   aspirin EC 81 MG tablet Take 81 mg by mouth daily.   atorvastatin 80 MG  tablet Commonly known as: LIPITOR Take 80 mg by mouth every morning.   B-1 500 MG Tabs Take 500 mg by mouth daily.   carboxymethylcellul-glycerin 0.5-0.9 % ophthalmic solution Commonly known as: REFRESH OPTIVE Apply to eye.   celecoxib 200 MG capsule Commonly known as: CELEBREX Take 200 mg by mouth 2 (two) times daily.   clonazePAM 1 MG tablet Commonly known as: KLONOPIN every evening.   colchicine 0.6 MG tablet Take 0.6 mg by mouth 2 (two) times daily as needed (gout).   cyclobenzaprine 10 MG tablet Commonly known as: FLEXERIL Take 10 mg by mouth daily as needed.   donepezil 5 MG tablet Commonly known as: ARICEPT Take 5 mg by mouth at bedtime.   FLUoxetine 40 MG capsule Commonly known as: PROZAC Take 40 mg by mouth daily.   HYDROcodone-acetaminophen 5-325 MG tablet Commonly known as: NORCO/VICODIN Take 1 tablet by  mouth every 6 (six) hours as needed for moderate pain.   hydroxychloroquine 200 MG tablet Commonly known as: PLAQUENIL Take 200 mg by mouth 2 (two) times daily.   lamoTRIgine 25 MG tablet Commonly known as: LAMICTAL TAKE 2 TABLETS BY MOUTH EVERY DAY What changed:  how much to take when to take this   levothyroxine 25 MCG tablet Commonly known as: SYNTHROID Take 25 mcg by mouth daily before breakfast.   meloxicam 15 MG tablet Commonly known as: MOBIC Take 15 mg by mouth daily.   mirabegron ER 25 MG Tb24 tablet Commonly known as: MYRBETRIQ Take 1 tablet (25 mg total) by mouth daily.   montelukast 10 MG tablet Commonly known as: SINGULAIR Take 10 mg by mouth at bedtime.   pantoprazole 40 MG tablet Commonly known as: PROTONIX Take 1 tablet by mouth daily.   sucralfate 1 g tablet Commonly known as: CARAFATE Take 1 g by mouth 4 (four) times daily as needed (heartburn).   tamsulosin 0.4 MG Caps capsule Commonly known as: FLOMAX TAKE 1 CAPSULE (0.4 MG TOTAL) BY MOUTH DAILY AFTER BREAKFAST.   triamcinolone cream 0.1 % Commonly known as:  KENALOG Apply topically.   vitamin B-12 1000 MCG tablet Commonly known as: CYANOCOBALAMIN Take 1,000 mcg by mouth daily.   VITAMIN B-2 PO Take 400 mg by mouth daily.   Cyto B2 343 MG/GM Powd Generic drug: Riboflavin Take by mouth.   Vitamin D3 50 MCG (2000 UT) capsule Take 2,000 Units by mouth daily.   zolpidem 5 MG tablet Commonly known as: AMBIEN Take 5 mg by mouth at bedtime.        Allergies: No Known Allergies  Family History: Family History  Problem Relation Age of Onset   Leukemia Mother    Bone cancer Father    Heart disease Brother    Heart disease Brother    Diabetes Brother    Hypertension Brother    Diabetes Brother    Hypertension Brother    Heart disease Brother    Breast cancer Paternal Aunt     Social History:  reports that she quit smoking about 28 years ago. Her smoking use included cigarettes. She smoked an average of 1 pack per day. She has never used smokeless tobacco. She reports that she does not drink alcohol and does not use drugs.  ROS: Pertinent ROS in HPI  Physical Exam: BP (!) 143/74   Pulse 84   Ht 5' 6"  (1.676 m)   Wt 223 lb (101.2 kg)   BMI 35.99 kg/m   Constitutional:  Well nourished. Alert and oriented, No acute distress. HEENT: [place] AT, moist mucus membranes.  Trachea midline Cardiovascular: No clubbing, cyanosis, or edema. Respiratory: Normal respiratory effort, no increased work of breathing. GU: No CVA tenderness.  No bladder fullness or masses.  Atrophic external genitalia, normal pubic hair distribution, no lesions.  Normal urethral meatus, no lesions, no prolapse, no discharge.   No urethral masses, tenderness and/or tenderness. No bladder fullness, tenderness or masses. Pale vagina mucosa, poor estrogen effect, no discharge, no lesions, fair pelvic support, grade I cystocele and no rectocele noted.  Anus and perineum are without rashes or lesions.     Neurologic: Grossly intact, no focal deficits, moving all 4  extremities. Psychiatric: Normal mood and affect.    Laboratory Data: WBC (White Blood Cell Count) 4.1 - 10.2 10^3/uL 7.1   RBC (Red Blood Cell Count) 4.04 - 5.48 10^6/uL 3.87 Low    Hemoglobin 12.0 - 15.0  gm/dL 11.5 Low    Hematocrit 35.0 - 47.0 % 36.5   MCV (Mean Corpuscular Volume) 80.0 - 100.0 fl 94.3   MCH (Mean Corpuscular Hemoglobin) 27.0 - 31.2 pg 29.7   MCHC (Mean Corpuscular Hemoglobin Concentration) 32.0 - 36.0 gm/dL 31.5 Low    Platelet Count 150 - 450 10^3/uL 252   RDW-CV (Red Cell Distribution Width) 11.6 - 14.8 % 14.6   MPV (Mean Platelet Volume) 9.4 - 12.4 fl 9.4   Neutrophils 1.50 - 7.80 10^3/uL 4.43   Lymphocytes 1.00 - 3.60 10^3/uL 1.67   Monocytes 0.00 - 1.50 10^3/uL 0.52   Eosinophils 0.00 - 0.55 10^3/uL 0.39   Basophils 0.00 - 0.09 10^3/uL 0.05   Neutrophil % 32.0 - 70.0 % 62.7   Lymphocyte % 10.0 - 50.0 % 23.6   Monocyte % 4.0 - 13.0 % 7.4   Eosinophil % 1.0 - 5.0 % 5.5 High    Basophil% 0.0 - 2.0 % 0.7   Immature Granulocyte % <=0.7 % 0.1   Immature Granulocyte Count <=0.06 10^3/L 0.01   Resulting Agency  [facility]  Specimen Collected: 09/09/20 09:18 Last Resulted: 09/09/20 09:44  Received From: [facility]  Result Received: 12/06/20 08:52   Glucose 70 - 110 mg/dL 108   Sodium 136 - 145 mmol/L 147 High    Potassium 3.6 - 5.1 mmol/L 4.1   Chloride 97 - 109 mmol/L 110 High    Carbon Dioxide (CO2) 22.0 - 32.0 mmol/L 24.9   Urea Nitrogen (BUN) 7 - 25 mg/dL 14   Creatinine 0.6 - 1.1 mg/dL 0.9   Glomerular Filtration Rate (eGFR), MDRD Estimate >60 mL/min/1.73sq m 62   Calcium 8.7 - 10.3 mg/dL 9.3   AST  8 - 39 U/L 14   ALT  5 - 38 U/L 15   Alk Phos (alkaline Phosphatase) 34 - 104 U/L 89   Albumin 3.5 - 4.8 g/dL 4.0   Bilirubin, Total 0.3 - 1.2 mg/dL 0.4   Protein, Total 6.1 - 7.9 g/dL 6.9   A/G Ratio 1.0 - 5.0 gm/dL 1.4   Resulting Agency  [facility]  Specimen Collected: 09/09/20 09:18 Last Resulted:  09/09/20 12:19  Received From: [facility]  Result Received: 12/06/20 08:52   Urinalysis Component     Latest Ref Rng & Units 12/06/2020  Color, Urine     YELLOW YELLOW  Appearance     CLEAR CLEAR  Specific Gravity, Urine     1.005 - 1.030 >1.030 (H)  pH     5.0 - 8.0 6.0  Glucose, UA     NEGATIVE mg/dL NEGATIVE  Hgb urine dipstick     NEGATIVE NEGATIVE  Bilirubin Urine     NEGATIVE SMALL (A)  Ketones, ur     NEGATIVE mg/dL TRACE (A)  Protein     NEGATIVE mg/dL 30 (A)  Nitrite     NEGATIVE POSITIVE (A)  Leukocytes,Ua     NEGATIVE SMALL (A)  Squamous Epithelial / LPF     0 - 5 6-10  Non Squamous Epithelial     NONE SEEN PRESENT (A)  WBC, UA     0 - 5 WBC/hpf >50  RBC / HPF     0 - 5 RBC/hpf 0-5  Bacteria, UA     NONE SEEN MANY (A)  Mucus      PRESENT  WBC Casts, UA      PRESENT   I have reviewed  the labs.   Pertinent Imaging: Results for AYESHA, MARKWELL (MRN [id number]) as of 12/06/2020 12:11  Ref. Range 12/06/2020 11:45  Scan Result Unknown 62m    In and Out Catheterization Patient is present today for a I & O catheterization due to dysuria. Patient was cleaned and prepped in a sterile fashion with betadine . A 14 FR cath was inserted no complications were noted , 20 ml of urine return was noted, urine was yellow clear in color. A clean urine sample was collected for urine culture.  Bladder was drained  And catheter was removed with out difficulty.    Assessment & Plan:    1. Dysuria -UA grossly infected  -Urine is sent for culture -Started on Septra DS twice daily for 7 days empirically will adjust if necessary once culture results are available  2. Vaginal atrophy -Prescribed Estrace vaginal cream -Advised to apply blueberry amount with a fingertip just inside the vagina nightly for 2 weeks and then Monday, Wednesday and Friday nights  3. Urge incontinence -Oxybutynin is not the ideal choice for this patient with her history of  Sjogren's syndrome and her age, but unfortunately her insurance coverage will not cover other medications for her urge incontinence   Return for pending urine culture results .  These notes generated with voice recognition software. I apologize for typographical errors.  SZara Council PA-C  [facility] [address] [place] [phone]

## 2020-12-06 NOTE — Telephone Encounter (Signed)
Spoke with patient and advised results   

## 2020-12-08 LAB — URINE CULTURE: Culture: >=100000 — AB

## 2021-02-16 ENCOUNTER — Encounter: Payer: Self-pay | Admitting: Gastroenterology

## 2021-02-16 NOTE — H&P (Signed)
Pre-Procedure H&P   Patient ID: Tonya Walton is a 71 y.o. female.  Gastroenterology Provider: Annamaria Helling, DO  Referring Provider: Octavia Bruckner, PA PCP: Rusty Aus, MD  Date: 02/17/2021  HPI Tonya Walton is a 71 y.o. female who presents today for Colonoscopy for Bright red blood per rectum.  Patient noted bright red blood per rectum in May 2022 mainly with wiping and after noted episodes of constipation.  No further episodes since July. Her last reported hemoglobin was 11.7 MCV 93 platelets 304,000. Her last colonoscopy was in December 2014 with 2 tubular adenomas and proctitis with recommended 5-year repeat. The patient has Sjogren's disease history of CVA, dementia, atrial fibrillation, history of breast cancer, seizures and several other comorbidities as below. Taking celebrex currently Brother with CRC at 77 y/o No other acute GI complaints  Past Medical History:  Diagnosis Date   Anxiety    Arthritis    Atrial fibrillation (HCC)    CHF (congestive heart failure) (HCC)    Chronic kidney disease    COPD (chronic obstructive pulmonary disease) (HCC)    Coronary artery disease    Depression    Family history of adverse reaction to anesthesia    brothers had a lung collaspe during an unknown procedure.   GERD (gastroesophageal reflux disease)    Headache    History of kidney stones    Hx of CABG 04/01/2015   Single vessel; LIMA-LAD   Hyperlipidemia    Hypertension    Hypothyroidism    Insomnia    Invasive ductal carcinoma of breast, female, right ([place]) 09/27/2010   Stage 1A; ER/PR (+) and HER2/neu indeterminant; pT1b pN0 (sn)   Neuromuscular disorder (HCC)    nerve damage in legs   Personal history of radiation therapy 2012   BREAST CA   Restless leg syndrome    Seizures (HCC)    Shortness of breath dyspnea    Sjogren's syndrome (HCC)    Stroke (HCC)    Some mild weakness in left side residual   Vitamin B12 deficiency     Past  Surgical History:  Procedure Laterality Date   APPENDECTOMY     BREAST BIOPSY Left 07/2013   NEG   BREAST BIOPSY Right 08/22/2016   benign   BREAST BIOPSY Right 01/07/2019   asymmetry bx with stereo, coil marker,fat necrosis   BREAST BIOPSY Right 01/07/2019   asymm affirm bx,  x marker,  fat necrosis   BREAST BIOPSY Right 03/25/2020   stereo bx, ribbon clip, path pending    BREAST EXCISIONAL BIOPSY Right 2012   POS   BREAST LUMPECTOMY  2012   Right breast   CARDIAC CATHETERIZATION N/A 03/02/2015   Procedure: Left Heart Cath and Coronary Angiography;  Surgeon: Teodoro Spray, MD;  Location: [place] [facility];  Service: Cardiovascular;  Laterality: N/A;   CATARACT EXTRACTION W/PHACO Right 10/14/2014   Procedure: CATARACT EXTRACTION PHACO AND INTRAOCULAR LENS PLACEMENT (IOC);  Surgeon: Leandrew Koyanagi, MD;  Location: [place];  Service: Ophthalmology;  Laterality: Right;   COLONOSCOPY     CORONARY ARTERY BYPASS GRAFT N/A 04/01/2015   Procedure: CORONARY ARTERY BYPASS GRAFTING (CABG) x one , using left mammary artery;  Surgeon: Ivin Poot, MD;  Location: [place];  Service: Open Heart Surgery;  Laterality: N/A;   CYSTOSCOPY/URETEROSCOPY/HOLMIUM LASER/STENT PLACEMENT Left 07/06/2020   Procedure: CYSTOSCOPY/URETEROSCOPY/STENT PLACEMENT/ retrograde pyelogram;  Surgeon: Abbie Sons, MD;  Location: [facility];  Service: Urology;  Laterality: Left;   CYSTOSCOPY/URETEROSCOPY/HOLMIUM LASER/STENT PLACEMENT Left 07/27/2020   Procedure: CYSTOSCOPY/URETEROSCOPY/HOLMIUM LASER/STENT EXCHANGE;  Surgeon: Abbie Sons, MD;  Location: [facility];  Service: Urology;  Laterality: Left;   JOINT REPLACEMENT Right    Total Knee Replacement   TEE WITHOUT CARDIOVERSION N/A 04/01/2015   Procedure: TRANSESOPHAGEAL ECHOCARDIOGRAM (TEE);  Surgeon: Ivin Poot, MD;  Location: [place];  Service: Open Heart Surgery;  Laterality: N/A;   TONSILLECTOMY     TOTAL KNEE ARTHROPLASTY Right 06/10/2014    Procedure: TOTAL KNEE ARTHROPLASTY;  Surgeon: Dereck Leep, MD;  Location: [facility];  Service: Orthopedics;  Laterality: Right;   TOTAL KNEE ARTHROPLASTY Left 09/02/2014   Procedure: TOTAL KNEE ARTHROPLASTY;  Surgeon: Dereck Leep, MD;  Location: [facility];  Service: Orthopedics;  Laterality: Left;    Family History Brother with CRC at 42 y/o No other h/o GI disease or malignancy  Review of Systems  Constitutional:  Negative for activity change, appetite change, chills, diaphoresis, fatigue, fever and unexpected weight change.  HENT:  Negative for trouble swallowing and voice change.   Respiratory:  Negative for shortness of breath and wheezing.   Cardiovascular:  Negative for chest pain, palpitations and leg swelling.  Gastrointestinal:  Positive for blood in stool and constipation. Negative for abdominal distention, abdominal pain, anal bleeding, diarrhea, nausea, rectal pain and vomiting.  Musculoskeletal:  Negative for arthralgias and myalgias.  Skin:  Negative for color change and pallor.  Neurological:  Negative for dizziness, syncope and weakness.  Psychiatric/Behavioral:  Negative for confusion.   All other systems reviewed and are negative.   Medications No current facility-administered medications on file prior to encounter.   Current Outpatient Medications on File Prior to Encounter  Medication Sig Dispense Refill   allopurinol (ZYLOPRIM) 100 MG tablet Take by mouth.     aspirin EC 81 MG tablet Take 81 mg by mouth daily.     atorvastatin (LIPITOR) 80 MG tablet Take 80 mg by mouth every morning.     carboxymethylcellul-glycerin (REFRESH OPTIVE) 0.5-0.9 % ophthalmic solution Apply to eye.     celecoxib (CELEBREX) 200 MG capsule Take 200 mg by mouth 2 (two) times daily.     Cholecalciferol (VITAMIN D3) 2000 units capsule Take 2,000 Units by mouth daily.      clonazePAM (KLONOPIN) 1 MG tablet every evening.     colchicine 0.6 MG tablet Take 0.6 mg by mouth 2 (two) times daily  as needed (gout).     cyclobenzaprine (FLEXERIL) 10 MG tablet Take 10 mg by mouth daily as needed.     donepezil (ARICEPT) 5 MG tablet Take 5 mg by mouth at bedtime.     FLUoxetine (PROZAC) 40 MG capsule Take 40 mg by mouth daily.     hydroxychloroquine (PLAQUENIL) 200 MG tablet Take 200 mg by mouth 2 (two) times daily.     lamoTRIgine (LAMICTAL) 25 MG tablet TAKE 2 TABLETS BY MOUTH EVERY DAY (Patient taking differently: Take 75 mg by mouth 2 (two) times daily.) 180 tablet 1   levothyroxine (SYNTHROID, LEVOTHROID) 25 MCG tablet Take 25 mcg by mouth daily before breakfast.     mirabegron ER (MYRBETRIQ) 25 MG TB24 tablet Take 1 tablet (25 mg total) by mouth daily. 30 tablet 0   montelukast (SINGULAIR) 10 MG tablet Take 10 mg by mouth at bedtime.     pantoprazole (PROTONIX) 40 MG tablet Take 1 tablet by mouth daily.     Riboflavin (CYTO B2) 343 MG/GM POWD Take by  mouth.     Riboflavin (VITAMIN B-2 PO) Take 400 mg by mouth daily.     sucralfate (CARAFATE) 1 g tablet Take 1 g by mouth 4 (four) times daily as needed (heartburn).     tamsulosin (FLOMAX) 0.4 MG CAPS capsule TAKE 1 CAPSULE (0.4 MG TOTAL) BY MOUTH DAILY AFTER BREAKFAST. 30 capsule 0   Thiamine HCl (B-1) 500 MG TABS Take 500 mg by mouth daily.     triamcinolone cream (KENALOG) 0.1 % Apply topically.     vitamin B-12 (CYANOCOBALAMIN) 1000 MCG tablet Take 1,000 mcg by mouth daily.     zolpidem (AMBIEN) 5 MG tablet Take 5 mg by mouth at bedtime.     HYDROcodone-acetaminophen (NORCO/VICODIN) 5-325 MG tablet Take 1 tablet by mouth every 6 (six) hours as needed for moderate pain. 8 tablet 0   meloxicam (MOBIC) 15 MG tablet Take 15 mg by mouth daily.      Pertinent medications related to GI and procedure were reviewed by me with the patient prior to the procedure   Current Facility-Administered Medications:    0.9 %  sodium chloride infusion, , Intravenous, Continuous, Annamaria Helling, DO, Last Rate: 20 mL/hr at 02/17/21 1249, New Bag  at 02/17/21 1249      No Known Allergies Allergies were reviewed by me prior to the procedure  Objective    Vitals:   02/17/21 1220  BP: (!) 170/73  Pulse: 75  Resp: 20  Temp: (!) 97.4 F (36.3 C)  TempSrc: Temporal  SpO2: 99%  Weight: 105.2 kg  Height: 5' 6"  (1.676 m)     Physical Exam Vitals and nursing note reviewed.  Constitutional:      General: She is not in acute distress.    Appearance: Normal appearance. She is not ill-appearing, toxic-appearing or diaphoretic.  HENT:     Head: Normocephalic and atraumatic.     Nose: Nose normal.     Mouth/Throat:     Mouth: Mucous membranes are moist.     Pharynx: Oropharynx is clear.  Eyes:     General: No scleral icterus.    Extraocular Movements: Extraocular movements intact.  Cardiovascular:     Rate and Rhythm: Normal rate and regular rhythm.     Heart sounds: Normal heart sounds. No murmur heard.   No friction rub. No gallop.  Pulmonary:     Effort: Pulmonary effort is normal. No respiratory distress.     Breath sounds: Normal breath sounds. No wheezing, rhonchi or rales.  Abdominal:     General: Bowel sounds are normal. There is no distension.     Palpations: Abdomen is soft.     Tenderness: There is no abdominal tenderness. There is no guarding or rebound.  Musculoskeletal:     Cervical back: Neck supple.     Right lower leg: No edema.     Left lower leg: No edema.  Skin:    General: Skin is warm and dry.     Coloration: Skin is not jaundiced or pale.  Neurological:     General: No focal deficit present.     Mental Status: She is alert and oriented to person, place, and time. Mental status is at baseline.  Psychiatric:        Mood and Affect: Mood normal.        Behavior: Behavior normal.        Thought Content: Thought content normal.        Judgment: Judgment normal.  Assessment:  Ms. Tonya Walton is a 71 y.o. female  who presents today for Colonoscopy for Bright red blood per  rectum.  Plan:  Colonoscopy with possible intervention today  Colonoscopy with possible biopsy, control of bleeding, polypectomy, and interventions as necessary has been discussed with the patient/patient representative. Informed consent was obtained from the patient/patient representative after explaining the indication, nature, and risks of the procedure including but not limited to death, bleeding, perforation, missed neoplasm/lesions, cardiorespiratory compromise, and reaction to medications. Opportunity for questions was given and appropriate answers were provided. Patient/patient representative has verbalized understanding is amenable to undergoing the procedure.   Annamaria Helling, DO  [facility]  Portions of the record may have been created with voice recognition software. Occasional wrong-word or 'sound-a-like' substitutions may have occurred due to the inherent limitations of voice recognition software.  Read the chart carefully and recognize, using context, where substitutions may have occurred.

## 2021-02-17 ENCOUNTER — Ambulatory Visit: Payer: Medicare HMO | Admitting: Anesthesiology

## 2021-02-17 ENCOUNTER — Encounter
Admission: RE | Disposition: A | Discharge status: Home / Self Care | Payer: Self-pay | Source: Ambulatory Visit | Attending: Gastroenterology

## 2021-02-17 ENCOUNTER — Encounter: Payer: Self-pay | Admitting: Gastroenterology

## 2021-02-17 ENCOUNTER — Other Ambulatory Visit: Payer: Self-pay

## 2021-02-17 ENCOUNTER — Ambulatory Visit
Admission: RE | Admit: 2021-02-17 | Discharge: 2021-02-17 | Disposition: A | Discharge status: Home / Self Care | Payer: Medicare HMO | Source: Ambulatory Visit | Attending: Gastroenterology | Admitting: Gastroenterology

## 2021-02-17 DIAGNOSIS — K219 Gastro-esophageal reflux disease without esophagitis: Secondary | ICD-10-CM | POA: Diagnosis not present

## 2021-02-17 DIAGNOSIS — I509 Heart failure, unspecified: Secondary | ICD-10-CM | POA: Diagnosis not present

## 2021-02-17 DIAGNOSIS — I4891 Unspecified atrial fibrillation: Secondary | ICD-10-CM | POA: Insufficient documentation

## 2021-02-17 DIAGNOSIS — I110 Hypertensive heart disease with heart failure: Secondary | ICD-10-CM | POA: Insufficient documentation

## 2021-02-17 DIAGNOSIS — E669 Obesity, unspecified: Secondary | ICD-10-CM | POA: Insufficient documentation

## 2021-02-17 DIAGNOSIS — Z1211 Encounter for screening for malignant neoplasm of colon: Secondary | ICD-10-CM | POA: Insufficient documentation

## 2021-02-17 DIAGNOSIS — F0390 Unspecified dementia without behavioral disturbance: Secondary | ICD-10-CM | POA: Diagnosis not present

## 2021-02-17 DIAGNOSIS — Z86010 Personal history of colonic polyps: Secondary | ICD-10-CM | POA: Insufficient documentation

## 2021-02-17 DIAGNOSIS — F418 Other specified anxiety disorders: Secondary | ICD-10-CM | POA: Diagnosis not present

## 2021-02-17 DIAGNOSIS — Z8673 Personal history of transient ischemic attack (TIA), and cerebral infarction without residual deficits: Secondary | ICD-10-CM | POA: Insufficient documentation

## 2021-02-17 DIAGNOSIS — K5730 Diverticulosis of large intestine without perforation or abscess without bleeding: Secondary | ICD-10-CM | POA: Diagnosis not present

## 2021-02-17 DIAGNOSIS — R519 Headache, unspecified: Secondary | ICD-10-CM | POA: Diagnosis not present

## 2021-02-17 DIAGNOSIS — K625 Hemorrhage of anus and rectum: Secondary | ICD-10-CM | POA: Diagnosis not present

## 2021-02-17 DIAGNOSIS — Z800 Family history of malignant neoplasm of digestive organs: Secondary | ICD-10-CM | POA: Insufficient documentation

## 2021-02-17 DIAGNOSIS — Z951 Presence of aortocoronary bypass graft: Secondary | ICD-10-CM | POA: Diagnosis not present

## 2021-02-17 DIAGNOSIS — J449 Chronic obstructive pulmonary disease, unspecified: Secondary | ICD-10-CM | POA: Diagnosis not present

## 2021-02-17 DIAGNOSIS — M3500 Sicca syndrome, unspecified: Secondary | ICD-10-CM | POA: Insufficient documentation

## 2021-02-17 DIAGNOSIS — R569 Unspecified convulsions: Secondary | ICD-10-CM | POA: Diagnosis not present

## 2021-02-17 DIAGNOSIS — Z853 Personal history of malignant neoplasm of breast: Secondary | ICD-10-CM | POA: Diagnosis not present

## 2021-02-17 DIAGNOSIS — K640 First degree hemorrhoids: Secondary | ICD-10-CM | POA: Insufficient documentation

## 2021-02-17 DIAGNOSIS — Z791 Long term (current) use of non-steroidal anti-inflammatories (NSAID): Secondary | ICD-10-CM | POA: Diagnosis not present

## 2021-02-17 DIAGNOSIS — M1990 Unspecified osteoarthritis, unspecified site: Secondary | ICD-10-CM | POA: Insufficient documentation

## 2021-02-17 DIAGNOSIS — Z87891 Personal history of nicotine dependence: Secondary | ICD-10-CM | POA: Insufficient documentation

## 2021-02-17 DIAGNOSIS — E039 Hypothyroidism, unspecified: Secondary | ICD-10-CM | POA: Diagnosis not present

## 2021-02-17 DIAGNOSIS — Z6837 Body mass index (BMI) 37.0-37.9, adult: Secondary | ICD-10-CM | POA: Insufficient documentation

## 2021-02-17 HISTORY — DX: Deficiency of other specified B group vitamins: E53.8

## 2021-02-17 HISTORY — PX: COLONOSCOPY WITH PROPOFOL: SHX5780

## 2021-02-17 HISTORY — DX: Chronic kidney disease, unspecified: N18.9

## 2021-02-17 HISTORY — DX: Hyperlipidemia, unspecified: E78.5

## 2021-02-17 SURGERY — COLONOSCOPY WITH PROPOFOL
Anesthesia: General

## 2021-02-17 MED ORDER — LIDOCAINE 2% (20 MG/ML) 5 ML SYRINGE
INTRAMUSCULAR | Status: DC | PRN
  Administered 2021-02-17: 20 mg via INTRAVENOUS

## 2021-02-17 MED ORDER — GLYCOPYRROLATE 0.2 MG/ML IJ SOLN
INTRAMUSCULAR | Status: DC | PRN
  Administered 2021-02-17: .2 mg via INTRAVENOUS

## 2021-02-17 MED ORDER — PROPOFOL 500 MG/50ML IV EMUL
INTRAVENOUS | Status: DC | PRN
  Administered 2021-02-17: 100 ug/kg/min via INTRAVENOUS

## 2021-02-17 MED ORDER — DEXMEDETOMIDINE HCL IN NACL 200 MCG/50ML IV SOLN
INTRAVENOUS | Status: DC | PRN
  Administered 2021-02-17: 12 ug via INTRAVENOUS

## 2021-02-17 MED ORDER — PROPOFOL 10 MG/ML IV BOLUS
INTRAVENOUS | Status: DC | PRN
  Administered 2021-02-17: 30 mg via INTRAVENOUS
  Administered 2021-02-17: 70 mg via INTRAVENOUS

## 2021-02-17 MED ORDER — SODIUM CHLORIDE 0.9 % IV SOLN: INTRAVENOUS | Status: DC

## 2021-02-17 NOTE — Anesthesia Preprocedure Evaluation (Addendum)
Anesthesia Evaluation  Patient identified by MRN, date of birth, ID band Patient awake    Reviewed: Allergy & Precautions, NPO status , Patient's Chart, lab work & pertinent test results  History of Anesthesia Complications Negative for: history of anesthetic complications  Airway Mallampati: IV   Neck ROM: Full    Dental  (+) Upper Dentures   Pulmonary COPD, former smoker (quit 1994),    Pulmonary exam normal breath sounds clear to auscultation       Cardiovascular hypertension, + CAD (s/p CABG) and +CHF  Normal cardiovascular exam+ dysrhythmias (a fib)  Rhythm:Regular Rate:Normal  ECG 04/07/20: NSR, nonspecific ST and T wave abnormalities   Neuro/Psych  Headaches, Seizures -,  PSYCHIATRIC DISORDERS Anxiety Depression CVA (residual left-sided weakness)    GI/Hepatic GERD  ,  Endo/Other  Hypothyroidism Obesity   Renal/GU negative Renal ROS     Musculoskeletal  (+) Arthritis , Sjogren syndrome   Abdominal   Peds  Hematology negative hematology ROS (+)   Anesthesia Other Findings   Reproductive/Obstetrics                            Anesthesia Physical Anesthesia Plan  ASA: 4  Anesthesia Plan: General   Post-op Pain Management:    Induction: Intravenous  PONV Risk Score and Plan: 3 and Propofol infusion, TIVA and Treatment may vary due to age or medical condition  Airway Management Planned: Natural Airway  Additional Equipment:   Intra-op Plan:   Post-operative Plan:   Informed Consent: I have reviewed the patients History and Physical, chart, labs and discussed the procedure including the risks, benefits and alternatives for the proposed anesthesia with the patient or authorized representative who has indicated his/her understanding and acceptance.       Plan Discussed with: CRNA  Anesthesia Plan Comments: (LMA/GETA backup discussed.  Patient consented for risks of  anesthesia including but not limited to:  - adverse reactions to medications - damage to eyes, teeth, lips or other oral mucosa - nerve damage due to positioning  - sore throat or hoarseness - damage to heart, brain, nerves, lungs, other parts of body or loss of life  Informed patient about role of CRNA in peri- and intra-operative care.  Patient voiced understanding.)        Anesthesia Quick Evaluation

## 2021-02-17 NOTE — Op Note (Signed)
[facility] Gastroenterology Patient Name: Tonya Walton Procedure Date: 02/17/2021 1:48 PM MRN: [id number] Account #: [id number] Date of Birth: 04/08/1950 Admit Type: Outpatient Age: 71 Room: [facility] ENDO ROOM 1 Gender: Female Note Status: Finalized Instrument Name: Jasper Riling [id number] Procedure:             Colonoscopy Indications:           High risk colon cancer surveillance: Personal history                         of colonic polyps, Family history of colon cancer Providers:             Rueben Bash, DO Referring MD:          Rusty Aus, MD (Referring MD) Medicines:             Monitored Anesthesia Care Complications:         No immediate complications. Estimated blood loss: None. Procedure:             Pre-Anesthesia Assessment:                        - Prior to the procedure, a History and Physical was                         performed, and patient medications and allergies were                         reviewed. The patient is competent. The risks and                         benefits of the procedure and the sedation options and                         risks were discussed with the patient. All questions                         were answered and informed consent was obtained.                         Patient identification and proposed procedure were                         verified by the physician, the nurse, the anesthetist                         and the technician in the endoscopy suite. Mental                         Status Examination: alert and oriented. Airway                         Examination: normal oropharyngeal airway and neck                         mobility. Respiratory Examination: clear to                         auscultation. CV Examination: RRR, no murmurs, no S3  or S4. Prophylactic Antibiotics: The patient does not                         require prophylactic antibiotics. Prior                          Anticoagulants: The patient has taken no previous                         anticoagulant or antiplatelet agents. ASA Grade                         Assessment: IV - A patient with severe systemic                         disease that is a constant threat to life. After                         reviewing the risks and benefits, the patient was                         deemed in satisfactory condition to undergo the                         procedure. The anesthesia plan was to use monitored                         anesthesia care (MAC). Immediately prior to                         administration of medications, the patient was                         re-assessed for adequacy to receive sedatives. The                         heart rate, respiratory rate, oxygen saturations,                         blood pressure, adequacy of pulmonary ventilation, and                         response to care were monitored throughout the                         procedure. The physical status of the patient was                         re-assessed after the procedure.                        After obtaining informed consent, the colonoscope was                         passed under direct vision. Throughout the procedure,                         the patient's blood pressure, pulse, and oxygen  saturations were monitored continuously. The                         Colonoscope was introduced through the anus and                         advanced to the the terminal ileum, with                         identification of the appendiceal orifice and IC                         valve. The colonoscopy was performed without                         difficulty. The patient tolerated the procedure well.                         The quality of the bowel preparation was evaluated                         using the BBPS [facility] Bowel Preparation Scale) with                         scores of: Right Colon = 3,  Transverse Colon = 3 and                         Left Colon = 3 (entire mucosa seen well with no                         residual staining, small fragments of stool or opaque                         liquid). The total BBPS score equals 9. The terminal                         ileum, ileocecal valve, appendiceal orifice, and                         rectum were photographed. Findings:      The perianal and digital rectal examinations were normal. Pertinent       negatives include normal sphincter tone.      A few small-mouthed diverticula were found in the recto-sigmoid colon       and sigmoid colon. Estimated blood loss: none.      Non-bleeding internal hemorrhoids were found during retroflexion. The       hemorrhoids were Grade I (internal hemorrhoids that do not prolapse).       Estimated blood loss: none.      The terminal ileum appeared normal. Estimated blood loss: none.      The exam was otherwise without abnormality on direct and retroflexion       views. Impression:            - Diverticulosis in the recto-sigmoid colon and in the                         sigmoid colon.                        -  Non-bleeding internal hemorrhoids.                        - The examined portion of the ileum was normal.                        - The examination was otherwise normal on direct and                         retroflexion views.                        - No specimens collected. Recommendation:        - Discharge patient to home.                        - Resume previous diet.                        - Continue present medications.                        - Await pathology results.                        - Repeat colonoscopy in 5 years for surveillance.                        - Return to referring physician as previously                         scheduled. Procedure Code(s):     --- Professional ---                        [phone], Colonoscopy, flexible; diagnostic, including                          collection of specimen(s) by brushing or washing, when                         performed (separate procedure) Diagnosis Code(s):     --- Professional ---                        Z86.010, Personal history of colonic polyps                        K64.0, First degree hemorrhoids                        Z80.0, Family history of malignant neoplasm of                         digestive organs                        K57.30, Diverticulosis of large intestine without                         perforation or abscess without bleeding CPT copyright 2019 American [employer] Association. All rights reserved. The codes documented in this report are preliminary and upon coder review may  be  revised to meet current compliance requirements. Attending Participation:      I personally performed the entire procedure. Volney American, DO Annamaria Helling DO, DO 02/17/2021 2:23:22 PM This report has been signed electronically. Number of Addenda: 0 Note Initiated On: 02/17/2021 1:48 PM Scope Withdrawal Time: 0 hours 12 minutes 10 seconds  Total Procedure Duration: 0 hours 20 minutes 32 seconds  Estimated Blood Loss:  Estimated blood loss: none.      [facility]

## 2021-02-17 NOTE — Transfer of Care (Signed)
Immediate Anesthesia Transfer of Care Note  Patient: KHALISE BILLARD  Procedure(s) Performed: COLONOSCOPY WITH PROPOFOL  Patient Location: Endoscopy Unit  Anesthesia Type:General  Level of Consciousness: awake and alert   Airway & Oxygen Therapy: Patient Spontanous Breathing  Post-op Assessment: Report given to RN and Post -op Vital signs reviewed and stable  Post vital signs: Reviewed  Last Vitals:  Vitals Value Taken Time  BP 120/59 02/17/21 1422  Temp 36.2 C 02/17/21 1422  Pulse 73 02/17/21 1422  Resp 20 02/17/21 1422  SpO2 97 % 02/17/21 1422    Last Pain:  Vitals:   02/17/21 1422  TempSrc: Temporal  PainSc: 0-No pain         Complications: No notable events documented.

## 2021-02-17 NOTE — Anesthesia Postprocedure Evaluation (Signed)
Anesthesia Post Note  Patient: Tonya Walton  Procedure(s) Performed: COLONOSCOPY WITH PROPOFOL  Patient location during evaluation: PACU Anesthesia Type: General Level of consciousness: awake and alert, oriented and patient cooperative Pain management: pain level controlled Vital Signs Assessment: post-procedure vital signs reviewed and stable Respiratory status: spontaneous breathing, nonlabored ventilation and respiratory function stable Cardiovascular status: blood pressure returned to baseline and stable Postop Assessment: adequate PO intake Anesthetic complications: no   No notable events documented.   Last Vitals:  Vitals:   02/17/21 1432 02/17/21 1442  BP: 140/90 (!) 144/71  Pulse: 79 81  Resp:  18  Temp:    SpO2: 97% 98%    Last Pain:  Vitals:   02/17/21 1442  TempSrc:   PainSc: 0-No pain                 Darrin Nipper

## 2021-02-17 NOTE — Interval H&P Note (Signed)
History and Physical Interval Note: Preprocedure H&P from 02/17/21  was reviewed and there was no interval change after seeing and examining the patient.  Written consent was obtained from the patient after discussion of risks, benefits, and alternatives. Patient has consented to proceed with Colonoscopy with possible intervention   02/17/2021 1:51 PM  [name]  has presented today for surgery, with the diagnosis of Hx of adenomatous colonic polyps (Z86.010) BRBPR (bright red blood per rectum) (K62.5).  The various methods of treatment have been discussed with the patient and family. After consideration of risks, benefits and other options for treatment, the patient has consented to  Procedure(s): COLONOSCOPY WITH PROPOFOL (N/A) as a surgical intervention.  The patient's history has been reviewed, patient examined, no change in status, stable for surgery.  I have reviewed the patient's chart and labs.  Questions were answered to the patient's satisfaction.     Tonya Walton

## 2021-02-18 ENCOUNTER — Encounter: Payer: Self-pay | Admitting: Gastroenterology

## 2021-03-14 ENCOUNTER — Ambulatory Visit: Payer: Medicare HMO | Admitting: Urology

## 2021-03-16 ENCOUNTER — Ambulatory Visit: Payer: Medicare HMO | Admitting: Urology

## 2021-03-29 ENCOUNTER — Other Ambulatory Visit: Payer: Self-pay | Admitting: Internal Medicine

## 2021-03-29 DIAGNOSIS — Z853 Personal history of malignant neoplasm of breast: Secondary | ICD-10-CM

## 2021-03-30 ENCOUNTER — Ambulatory Visit: Payer: Medicare HMO | Admitting: Urology

## 2021-04-08 ENCOUNTER — Ambulatory Visit
Admission: RE | Admit: 2021-04-08 | Discharge: 2021-04-08 | Disposition: A | Discharge status: Home / Self Care | Payer: Medicare HMO | Source: Ambulatory Visit | Attending: Urology | Admitting: Urology

## 2021-04-08 ENCOUNTER — Ambulatory Visit: Payer: Medicare HMO | Admitting: Urology

## 2021-04-08 ENCOUNTER — Encounter: Payer: Self-pay | Admitting: Urology

## 2021-04-08 ENCOUNTER — Other Ambulatory Visit: Payer: Self-pay

## 2021-04-08 VITALS — BP 121/75 | HR 96 | Ht 66.0 in | Wt 224.0 lb

## 2021-04-08 DIAGNOSIS — N390 Urinary tract infection, site not specified: Secondary | ICD-10-CM | POA: Diagnosis not present

## 2021-04-08 DIAGNOSIS — N200 Calculus of kidney: Secondary | ICD-10-CM | POA: Insufficient documentation

## 2021-04-08 DIAGNOSIS — N3281 Overactive bladder: Secondary | ICD-10-CM | POA: Diagnosis not present

## 2021-04-08 DIAGNOSIS — Z87442 Personal history of urinary calculi: Secondary | ICD-10-CM

## 2021-04-08 NOTE — Progress Notes (Signed)
04/08/2021 1:55 PM   [name] 1950/01/31 [id number]  Referring provider: Rusty Aus, MD [place] [place],  [place] [id number]  Chief Complaint  Patient presents with   Nephrolithiasis    Urologic history: 1.  History urinary calculi Ureteroscopic removal 7 mm left renal calculus 07/27/2020 Stone analysis CaOxMono 100% No other calculi on CT  HPI: 71 y.o. female presents for 35-monthfollow-up visit.  Denies flank, abdominal or pelvic pain States she has had recurrent UTIs [facility] with + culture E. Coli [facility]. Coli States she was started on antibiotics 2 days ago for recurrent symptoms.  UA showed 4-10 WBC with many bacteria however there were many squamous epithelial cells and culture grew mixed flora   PMH: Past Medical History:  Diagnosis Date   Anxiety    Arthritis    Atrial fibrillation (HCC)    CHF (congestive heart failure) (HCC)    Chronic kidney disease    COPD (chronic obstructive pulmonary disease) (HCC)    Coronary artery disease    Depression    Family history of adverse reaction to anesthesia    brothers had a lung collaspe during an unknown procedure.   GERD (gastroesophageal reflux disease)    Headache    History of kidney stones    Hx of CABG 04/01/2015   Single vessel; LIMA-LAD   Hyperlipidemia    Hypertension    Hypothyroidism    Insomnia    Invasive ductal carcinoma of breast, female, right ([place] 09/27/2010   Stage 1A; ER/PR (+) and HER2/neu indeterminant; pT1b pN0 (sn)   Neuromuscular disorder (HCC)    nerve damage in legs   Personal history of radiation therapy 2012   BREAST CA   Restless leg syndrome    Seizures (HCC)    Shortness of breath dyspnea    Sjogren's syndrome (HCC)    Stroke (HCC)    Some mild weakness in left side residual   Vitamin B12 deficiency     Surgical History: Past Surgical History:  Procedure  Laterality Date   APPENDECTOMY     BREAST BIOPSY Left 07/2013   NEG   BREAST BIOPSY Right 08/22/2016   benign   BREAST BIOPSY Right 01/07/2019   asymmetry bx with stereo, coil marker,fat necrosis   BREAST BIOPSY Right 01/07/2019   asymm affirm bx,  x marker,  fat necrosis   BREAST BIOPSY Right 03/25/2020   stereo bx, ribbon clip, path pending    BREAST EXCISIONAL BIOPSY Right 2012   POS   BREAST LUMPECTOMY  2012   Right breast   CARDIAC CATHETERIZATION N/A 03/02/2015   Procedure: Left Heart Cath and Coronary Angiography;  Surgeon: KTeodoro Spray MD;  [facility];  Service: Cardiovascular;  Laterality: N/A;   CATARACT EXTRACTION W/PHACO Right 10/14/2014   Procedure: CATARACT EXTRACTION PHACO AND INTRAOCULAR LENS PLACEMENT (IOC);  Surgeon: CLeandrew Koyanagi MD;  [facility]  Service: Ophthalmology;  Laterality: Right;   COLONOSCOPY     COLONOSCOPY WITH PROPOFOL N/A 02/17/2021   Procedure: COLONOSCOPY WITH PROPOFOL;  Surgeon: RAnnamaria Helling DO;  [facility];  Service: Gastroenterology;  Laterality: N/A;   CORONARY ARTERY BYPASS GRAFT N/A 04/01/2015   Procedure: CORONARY ARTERY BYPASS GRAFTING (CABG) x one , using left mammary artery;  Surgeon: PIvin Poot MD;  [facility]  Service: Open Heart Surgery;  Laterality: N/A;   CYSTOSCOPY/URETEROSCOPY/HOLMIUM  LASER/STENT PLACEMENT Left 07/06/2020   Procedure: CYSTOSCOPY/URETEROSCOPY/STENT PLACEMENT/ retrograde pyelogram;  Surgeon: Abbie Sons, MD;  Location: [facility];  Service: Urology;  Laterality: Left;   CYSTOSCOPY/URETEROSCOPY/HOLMIUM LASER/STENT PLACEMENT Left 07/27/2020   Procedure: CYSTOSCOPY/URETEROSCOPY/HOLMIUM LASER/STENT EXCHANGE;  Surgeon: Abbie Sons, MD;  Location: [facility];  Service: Urology;  Laterality: Left;   JOINT REPLACEMENT Right    Total Knee Replacement   TEE WITHOUT CARDIOVERSION N/A 04/01/2015   Procedure: TRANSESOPHAGEAL ECHOCARDIOGRAM (TEE);   Surgeon: Ivin Poot, MD;  Location: [place];  Service: Open Heart Surgery;  Laterality: N/A;   TONSILLECTOMY     TOTAL KNEE ARTHROPLASTY Right 06/10/2014   Procedure: TOTAL KNEE ARTHROPLASTY;  Surgeon: Dereck Leep, MD;  Location: [facility];  Service: Orthopedics;  Laterality: Right;   TOTAL KNEE ARTHROPLASTY Left 09/02/2014   Procedure: TOTAL KNEE ARTHROPLASTY;  Surgeon: Dereck Leep, MD;  Location: [facility];  Service: Orthopedics;  Laterality: Left;    Home Medications:  Allergies as of 04/08/2021   No Known Allergies      Medication List        Accurate as of April 08, 2021  1:55 PM. If you have any questions, ask your nurse or doctor.          allopurinol 100 MG tablet Commonly known as: ZYLOPRIM Take by mouth.   aspirin EC 81 MG tablet Take 81 mg by mouth daily.   atorvastatin 80 MG tablet Commonly known as: LIPITOR Take 80 mg by mouth every morning.   B-1 500 MG Tabs Take 500 mg by mouth daily.   carboxymethylcellul-glycerin 0.5-0.9 % ophthalmic solution Commonly known as: REFRESH OPTIVE Apply to eye.   celecoxib 200 MG capsule Commonly known as: CELEBREX Take 200 mg by mouth 2 (two) times daily.   clonazePAM 1 MG tablet Commonly known as: KLONOPIN every evening.   colchicine 0.6 MG tablet Take 0.6 mg by mouth 2 (two) times daily as needed (gout).   cyclobenzaprine 10 MG tablet Commonly known as: FLEXERIL Take 10 mg by mouth daily as needed.   donepezil 5 MG tablet Commonly known as: ARICEPT Take 5 mg by mouth at bedtime.   estradiol 0.1 MG/GM vaginal cream Commonly known as: ESTRACE VAGINAL Apply 0.43m (pea-sized amount)  just inside the vaginal introitus with a finger-tip on Monday, Wednesday and Friday nights.   FLUoxetine 40 MG capsule Commonly known as: PROZAC Take 40 mg by mouth daily.   HYDROcodone-acetaminophen 5-325 MG tablet Commonly known as: NORCO/VICODIN Take 1 tablet by mouth every 6 (six) hours as needed for moderate  pain.   hydroxychloroquine 200 MG tablet Commonly known as: PLAQUENIL Take 200 mg by mouth 2 (two) times daily.   lamoTRIgine 25 MG tablet Commonly known as: LAMICTAL TAKE 2 TABLETS BY MOUTH EVERY DAY What changed:  how much to take when to take this   levothyroxine 25 MCG tablet Commonly known as: SYNTHROID Take 25 mcg by mouth daily before breakfast.   meloxicam 15 MG tablet Commonly known as: MOBIC Take 15 mg by mouth daily.   mirabegron ER 25 MG Tb24 tablet Commonly known as: MYRBETRIQ Take 1 tablet (25 mg total) by mouth daily.   montelukast 10 MG tablet Commonly known as: SINGULAIR Take 10 mg by mouth at bedtime.   pantoprazole 40 MG tablet Commonly known as: PROTONIX Take 1 tablet by mouth daily.   sucralfate 1 g tablet Commonly known as: CARAFATE Take 1 g by mouth 4 (four) times daily as needed (heartburn).  sulfamethoxazole-trimethoprim 800-160 MG tablet Commonly known as: BACTRIM DS Take 1 tablet by mouth every 12 (twelve) hours.   tamsulosin 0.4 MG Caps capsule Commonly known as: FLOMAX TAKE 1 CAPSULE (0.4 MG TOTAL) BY MOUTH DAILY AFTER BREAKFAST.   triamcinolone cream 0.1 % Commonly known as: KENALOG Apply topically.   vitamin B-12 1000 MCG tablet Commonly known as: CYANOCOBALAMIN Take 1,000 mcg by mouth daily.   VITAMIN B-2 PO Take 400 mg by mouth daily.   Cyto B2 343 MG/GM Powd Generic drug: Riboflavin Take by mouth.   Vitamin D3 50 MCG (2000 UT) capsule Take 2,000 Units by mouth daily.   zolpidem 5 MG tablet Commonly known as: AMBIEN Take 5 mg by mouth at bedtime.        Allergies: No Known Allergies  Family History: Family History  Problem Relation Age of Onset   Leukemia Mother    Bone cancer Father    Heart disease Brother    Heart disease Brother    Diabetes Brother    Hypertension Brother    Diabetes Brother    Hypertension Brother    Heart disease Brother    Breast cancer Paternal Aunt     Social History:   reports that she quit smoking about 28 years ago. Her smoking use included cigarettes. She smoked an average of 1 pack per day. She has never used smokeless tobacco. She reports that she does not drink alcohol and does not use drugs.   Physical Exam: BP 121/75    Pulse 96    Ht _0  (1.676 m)    Wt 224 lb (101.6 kg)    BMI 36.15 kg/m   Constitutional:  Alert and oriented, No acute distress. HEENT: [place] AT, moist mucus membranes.  Trachea midline, no masses. Cardiovascular: No clubbing, cyanosis, or edema. Respiratory: Normal respiratory effort, no increased work of breathing. Neurologic: Grossly intact, no focal deficits, moving all 4 extremities. Psychiatric: Normal mood and affect.   Pertinent Imaging: Images of a KUB performed today were personally reviewed and interpreted and no calcifications suspicious for recurrent urinary tract stones were identified   Assessment & Plan:    1.  Personal history urinary calculi No evidence of recurrent stones  2.  Recurrent UTI She is on low-dose vaginal estrogen 2 culture documented UTIs in the last 2 months Trial low-dose antibiotic prophylaxis x2 months with trimethoprim 100 mg daily to start after she completes her current antibiotic therapy  3.  Overactive bladder Stable   Abbie Sons, MD  [facility] [address], [place], [place] [address] [phone]

## 2021-04-11 ENCOUNTER — Emergency Department: Payer: Medicare HMO

## 2021-04-11 ENCOUNTER — Other Ambulatory Visit: Payer: Self-pay

## 2021-04-11 ENCOUNTER — Emergency Department
Admission: EM | Admit: 2021-04-11 | Discharge: 2021-04-12 | Disposition: A | Discharge status: Home / Self Care | Payer: Medicare HMO | Attending: Emergency Medicine | Admitting: Emergency Medicine

## 2021-04-11 ENCOUNTER — Encounter: Payer: Self-pay | Admitting: Emergency Medicine

## 2021-04-11 ENCOUNTER — Other Ambulatory Visit: Payer: Self-pay | Admitting: *Deleted

## 2021-04-11 DIAGNOSIS — M79651 Pain in right thigh: Secondary | ICD-10-CM | POA: Diagnosis not present

## 2021-04-11 DIAGNOSIS — R0789 Other chest pain: Secondary | ICD-10-CM | POA: Diagnosis present

## 2021-04-11 DIAGNOSIS — R002 Palpitations: Secondary | ICD-10-CM | POA: Insufficient documentation

## 2021-04-11 DIAGNOSIS — Z20822 Contact with and (suspected) exposure to covid-19: Secondary | ICD-10-CM | POA: Insufficient documentation

## 2021-04-11 DIAGNOSIS — R791 Abnormal coagulation profile: Secondary | ICD-10-CM | POA: Diagnosis not present

## 2021-04-11 DIAGNOSIS — M25512 Pain in left shoulder: Secondary | ICD-10-CM | POA: Insufficient documentation

## 2021-04-11 DIAGNOSIS — R7989 Other specified abnormal findings of blood chemistry: Secondary | ICD-10-CM | POA: Diagnosis not present

## 2021-04-11 DIAGNOSIS — M79652 Pain in left thigh: Secondary | ICD-10-CM | POA: Diagnosis not present

## 2021-04-11 DIAGNOSIS — I700 Atherosclerosis of aorta: Secondary | ICD-10-CM | POA: Diagnosis not present

## 2021-04-11 DIAGNOSIS — R0602 Shortness of breath: Secondary | ICD-10-CM | POA: Diagnosis not present

## 2021-04-11 DIAGNOSIS — K449 Diaphragmatic hernia without obstruction or gangrene: Secondary | ICD-10-CM | POA: Insufficient documentation

## 2021-04-11 LAB — BASIC METABOLIC PANEL
Anion gap: 6 (ref 5–15)
BUN: 29 mg/dL — ABNORMAL HIGH (ref 8–23)
CO2: 25 mmol/L (ref 22–32)
Calcium: 8.8 mg/dL — ABNORMAL LOW (ref 8.9–10.3)
Chloride: 108 mmol/L (ref 98–111)
Creatinine, Ser: 1.38 mg/dL — ABNORMAL HIGH (ref 0.44–1.00)
GFR, Estimated: 41 mL/min — ABNORMAL LOW (ref >60)
Glucose, Bld: 152 mg/dL — ABNORMAL HIGH (ref 70–99)
Potassium: 3.9 mmol/L (ref 3.5–5.1)
Sodium: 139 mmol/L (ref 135–145)

## 2021-04-11 LAB — D-DIMER, QUANTITATIVE: D-Dimer, Quant: 0.87 ug/mL-FEU — ABNORMAL HIGH (ref 0.00–0.50)

## 2021-04-11 LAB — CBC
HCT: 36 % (ref 36.0–46.0)
Hemoglobin: 11.3 g/dL — ABNORMAL LOW (ref 12.0–15.0)
MCH: 28 pg (ref 26.0–34.0)
MCHC: 31.4 g/dL (ref 30.0–36.0)
MCV: 89.3 fL (ref 80.0–100.0)
Platelets: 259 10*3/uL (ref 150–400)
RBC: 4.03 MIL/uL (ref 3.87–5.11)
RDW: 14.2 % (ref 11.5–15.5)
WBC: 7 10*3/uL (ref 4.0–10.5)
nRBC: 0 % (ref 0.0–0.2)

## 2021-04-11 LAB — BRAIN NATRIURETIC PEPTIDE: B Natriuretic Peptide: 60.3 pg/mL (ref 0.0–100.0)

## 2021-04-11 LAB — TROPONIN I (HIGH SENSITIVITY)
Troponin I (High Sensitivity): 5 ng/L (ref <18)
Troponin I (High Sensitivity): 5 ng/L (ref <18)

## 2021-04-11 LAB — MAGNESIUM: Magnesium: 2.1 mg/dL (ref 1.7–2.4)

## 2021-04-11 LAB — RESP PANEL BY RT-PCR (FLU A&B, COVID) ARPGX2
Influenza A by PCR: NEGATIVE
Influenza B by PCR: NEGATIVE
SARS Coronavirus 2 by RT PCR: NEGATIVE

## 2021-04-11 MED ORDER — IOHEXOL 350 MG/ML SOLN
60.0000 mL | Freq: Once | INTRAVENOUS | Status: AC | PRN
  Administered 2021-04-11: 60 mL via INTRAVENOUS

## 2021-04-11 MED ORDER — LIDOCAINE 5 % EX PTCH
1.0000 | MEDICATED_PATCH | CUTANEOUS | Status: DC
  Administered 2021-04-11: 1 via TRANSDERMAL
  Filled 2021-04-11: qty 1

## 2021-04-11 MED ORDER — LIDOCAINE 5 % EX PTCH: 1.0000 | MEDICATED_PATCH | Freq: Two times a day (BID) | CUTANEOUS | 0 refills | Status: AC

## 2021-04-11 MED ORDER — TRIMETHOPRIM 100 MG PO TABS: 100.0000 mg | ORAL_TABLET | Freq: Every day | ORAL | 0 refills | Status: DC

## 2021-04-11 NOTE — ED Triage Notes (Signed)
Pt to ED via POV with c/o her left arm hurting that began on Friday and her palpitations and SHOB began on Thursday, she went to Dr. Sabra Heck and they sent her here to be evaluated.   ?

## 2021-04-11 NOTE — ED Notes (Addendum)
Pt NAD in bed, a/ox4, speaking in full and complete sentences. states she has been having intermittent palpitations, exertional SOB, orthopnea and chest pressure x 5 days. 5/10 pressure, with left arm pain. LS clear bilat ?

## 2021-04-11 NOTE — Discharge Instructions (Addendum)
I suspect more likely musculoskeletal in nature given your pain is worse with moving your arm you should call your orthopedic doctor to get follow-up.  Your cardiac markers were negative and your CT was negative for pulmonary embolism you can call your cardiology doctor or the cardiologist attached for follow-up of your heart ? ?Return to the ER if you have change in your symptoms or any other concern ? ? ?IMPRESSION: ?1. No evidence of pulmonary embolus. ?2. No acute intrathoracic process. ?3. Small hiatal hernia. ?4.  Aortic Atherosclerosis (ICD10-I70.0). ?  ?

## 2021-04-11 NOTE — ED Provider Notes (Signed)
? ?[facility] ?Provider Note ? ? ? Event Date/Time  ? First MD Initiated Contact with Patient 04/11/21 1915   ?  (approximate) ? ? ?History  ? ?Palpitations, Shortness of Breath, and Arm Pain ? ? ?HPI ? ?Tonya Walton is a 71 y.o. female   who had some left arm pain on Friday and had developed some palpitations or shortness shortness of breath on Thursday.  Patient had a history of a single-vessel bypass 2017 to the LAD.  Patient reports multiple symptoms.  She reports pain in her left shoulder but is worse with movement.  She reports feeling that she has been going in and out of A-fib but she does report having a history of this baby aspirin only for.  She states that she does have a little bit of shortness of breath associated with the palpitations as well.  She also reports some pain in the back of her legs.  She denies any abdominal pain.  She stated that her doctor wanted to come here to make sure that her heart looked good given some abnormality seen on her EKG. ? ?Patient sent in due to abnormal EKG with T wave inversions to rule out NSTEMI. ? ? ?Physical Exam  ? ?Triage Vital Signs: ?ED Triage Vitals  ?Enc Vitals Group  ?   BP 04/11/21 1700 120/83  ?   Pulse Rate 04/11/21 1700 90  ?   Resp 04/11/21 1700 20  ?   Temp 04/11/21 1700 98.4 ?F (36.9 ?C)  ?   Temp Source 04/11/21 1700 Oral  ?   SpO2 04/11/21 1700 94 %  ?   Weight 04/11/21 1701 232 lb (105.2 kg)  ?   Height 04/11/21 1701 '5\' 6"'$  (1.676 m)  ?   Head Circumference --   ?   Peak Flow --   ?   Pain Score 04/11/21 1700 10  ?   Pain Loc --   ?   Pain Edu? --   ?   Excl. in [place]? --   ? ? ?Most recent vital signs: ?Vitals:  ? 04/11/21 1700  ?BP: 120/83  ?Pulse: 90  ?Resp: 20  ?Temp: 98.4 ?F (36.9 ?C)  ?SpO2: 94%  ? ? ? ?General: Awake, no distress.  ?CV:  Good peripheral perfusion ?Resp:  Normal effort.  Clear lung ?Abd:  No distention.  Nontender ?Other:  Patient reporting some pain in her bilateral legs just above the knee in the  thighs.  But no calf tenderness.  Patient is point tenderness over the left shoulder with increasing pain with movement.  She is got no swelling noted in the left arm.  No rash noted.  Good distal pulse. ? ? ?ED Results / Procedures / Treatments  ? ?Labs ?(all labs ordered are listed, but only abnormal results are displayed) ?Labs Reviewed  ?BASIC METABOLIC PANEL - Abnormal; Notable for the following components:  ?    Result Value  ? Glucose, Bld 152 (*)   ? BUN 29 (*)   ? Creatinine, Ser 1.38 (*)   ? Calcium 8.8 (*)   ? GFR, Estimated 41 (*)   ? All other components within normal limits  ?CBC - Abnormal; Notable for the following components:  ? Hemoglobin 11.3 (*)   ? All other components within normal limits  ?TROPONIN I (HIGH SENSITIVITY)  ?TROPONIN I (HIGH SENSITIVITY)  ? ? ? ?EKG ? ?My interpretation of EKG: ? ?Normal sinus rate of 89 with a T  wave inversion in V2 but otherwise normal intervals although a lot of artifact so we will get a repeat. ? ?Sinus rate of 82 without any ST elevations, T wave inversion in aVL, V2, QTc slightly prolonged ? ? ? ?RADIOLOGY ?I have reviewed the xray personally and see no evidence of pneumonia, no evidence of fracture on the shoulder x-ray ? ?PROCEDURES: ? ?Critical Care performed: No ? ?Procedures ? ? ?MEDICATIONS ORDERED IN ED: ?Medications  ?lidocaine (LIDODERM) 5 % 1 patch (1 patch Transdermal Patch Applied 04/11/21 2016)  ?iohexol (OMNIPAQUE) 350 MG/ML injection 60 mL (60 mLs Intravenous Contrast Given 04/11/21 2106)  ? ? ? ?IMPRESSION / MDM / ASSESSMENT AND PLAN / ED COURSE  ?I reviewed the triage vital signs and the nursing notes. ?             ?               ? ?Differential diagnosis includes, but is not limited to, chest pain and shortness of breath will get cardiac markers, D-dimer to rule out PE, seems slightly musculoskeletal in nature given pain is worse with movement of the left arm.  X-ray to rule out any fractures.  Ultrasounds to rule out DVTs given patient  reports a little bit of leg pain ? ?Ultrasounds were negative ?X-ray with some degenerative changes of the shoulder but negative chest x-ray ?COVID, flu negative.  Troponins negative x2.  D-dimer slightly elevated so ordered CT scan ?Creatinine slightly elevated at 1.38 ?Hemoglobin stable ?BNP normal ? ?  ?IMPRESSION: ?1. No evidence of pulmonary embolus. ?2. No acute intrathoracic process. ?3. Small hiatal hernia. ?4.  Aortic Atherosclerosis (ICD10-I70.0). ?  ? ? ?Repeat EKG with similar T wave inversions.  Slightly prolonged QTc but denies any syncope symptoms.  On reevaluation after lidocaine patch patient feeling much better discussed the incidental findings on CT imaging.  At this time patient feels comfortable with discharge home I suspect more likely musculoskeletal in nature given is worse with movement given reassuring work-up.  We will have her follow-up outpatient with her cardiologist.  She expressed understanding felt comfortable with this plan ? ?The patient is on the cardiac monitor to evaluate for evidence of arrhythmia and/or significant heart rate changes ? ?FINAL CLINICAL IMPRESSION(S) / ED DIAGNOSES  ? ?Final diagnoses:  ?Atypical chest pain  ? ? ? ?Rx / DC Orders  ? ?ED Discharge Orders   ? ?      Ordered  ?  lidocaine (LIDODERM) 5 %  Every 12 hours       ? 04/11/21 2303  ? ?  ?  ? ?  ? ? ? ?Note:  This document was prepared using Dragon voice recognition software and may include unintentional dictation errors. ?  ?Vanessa Leeds, MD ?04/11/21 2304 ? ?

## 2021-04-14 ENCOUNTER — Other Ambulatory Visit: Payer: Medicare HMO

## 2021-04-14 ENCOUNTER — Inpatient Hospital Stay: Admission: RE | Admit: 2021-04-14 | Payer: Medicare HMO | Source: Ambulatory Visit

## 2021-06-03 ENCOUNTER — Telehealth: Payer: Self-pay

## 2021-06-03 NOTE — Telephone Encounter (Signed)
Pt called asking if she needs to continue her low dose antibiotic. Patient states she has 10 days left ?

## 2021-06-05 NOTE — Telephone Encounter (Signed)
Recc low dose for only 8 weeks. Sched PA f/u 2-3 weeks after she finishes antibiotics ?

## 2021-06-06 NOTE — Telephone Encounter (Signed)
Notified patient as instructed, patient pleased. Discussed follow-up appointments, patient agrees  

## 2021-06-27 NOTE — Progress Notes (Deleted)
06/28/2021 12:51 PM   Tonya Walton 1950-02-21 [id number]  Referring provider: Rusty Aus, MD [facility] [place],  [place] [address]  Urological history: 1. Nephrolithiasis -stone composition of CaOxMono 100% -left URS in 2022 -KUB, 03/2021 - NED  2. rUTI's -contributing factors of age, vaginal atrophy, incontinence,  -documented positive urine cultures over the last year  E. coli, March 18, 2021  E. coli, December 06, 2020  07/30/2020 Multiple species -Manage with vaginal estrogen cream and trimethoprim 100 mg daily    3. High risk hematuria -former smoker -CTU 2022 - NED -cysto 2022 - NED - no reports of gross hematuria -UA negative micro heme  4. Urge incontinence -contributing factors of age, vaginal atrophy, depression, COPD, HTN, CVA, neuromuscular disorder, obesity, benzo's and OAB agent -managed with incontinence pads   5. OAB -contributing factors of age, vaginal atrophy, depression, CVA and neuromuscular disorder -PVR *** -managed with oxybutynin XL 10 mg daily   No chief complaint on file.      HPI: Tonya Walton is a 71 y.o. female who presents today for recheck after being on a trial of low-dose prophylactic antibiotics for recurrent UTIs.  She was last seen on April 08, 2021 by Dr. Bernardo Heater and KUB taken at that time did not note any evidence of recurrent stones and she was started on trimethoprim 100 mg daily for 2 months.  She was having some burning with urination on April 26, 2021 and dropped off a urine at her PCPs office where urinalysis and urine culture was performed.  Urinalysis was benign and urine culture was positive for M UF.   PMH: Past Medical History:  Diagnosis Date   Anxiety    Arthritis    Atrial fibrillation (HCC)    CHF (congestive heart failure) (HCC)    Chronic kidney disease    COPD (chronic obstructive pulmonary disease) (HCC)    Coronary artery disease     Depression    Family history of adverse reaction to anesthesia    brothers had a lung collaspe during an unknown procedure.   GERD (gastroesophageal reflux disease)    Headache    History of kidney stones    Hx of CABG 04/01/2015   Single vessel; LIMA-LAD   Hyperlipidemia    Hypertension    Hypothyroidism    Insomnia    Invasive ductal carcinoma of breast, female, right ([place]) 09/27/2010   Stage 1A; ER/PR (+) and HER2/neu indeterminant; pT1b pN0 (sn)   Neuromuscular disorder (HCC)    nerve damage in legs   Personal history of radiation therapy 2012   BREAST CA   Restless leg syndrome    Seizures (HCC)    Shortness of breath dyspnea    Sjogren's syndrome (HCC)    Stroke (HCC)    Some mild weakness in left side residual   Vitamin B12 deficiency     Surgical History: Past Surgical History:  Procedure Laterality Date   APPENDECTOMY     BREAST BIOPSY Left 07/2013   NEG   BREAST BIOPSY Right 08/22/2016   benign   BREAST BIOPSY Right 01/07/2019   asymmetry bx with stereo, coil marker,fat necrosis   BREAST BIOPSY Right 01/07/2019   asymm affirm bx,  x marker,  fat necrosis   BREAST BIOPSY Right 03/25/2020   stereo bx, ribbon clip, path pending    BREAST EXCISIONAL BIOPSY Right 2012   POS   BREAST LUMPECTOMY  2012  Right breast   CARDIAC CATHETERIZATION N/A 03/02/2015   Procedure: Left Heart Cath and Coronary Angiography;  Surgeon: Teodoro Spray, MD;  Location: [place] [facility];  Service: Cardiovascular;  Laterality: N/A;   CATARACT EXTRACTION W/PHACO Right 10/14/2014   Procedure: CATARACT EXTRACTION PHACO AND INTRAOCULAR LENS PLACEMENT (IOC);  Surgeon: Leandrew Koyanagi, MD;  Location: [place];  Service: Ophthalmology;  Laterality: Right;   COLONOSCOPY     COLONOSCOPY WITH PROPOFOL N/A 02/17/2021   Procedure: COLONOSCOPY WITH PROPOFOL;  Surgeon: Annamaria Helling, DO;  Location: [facility] ENDOSCOPY;  Service: Gastroenterology;  Laterality: N/A;   CORONARY  ARTERY BYPASS GRAFT N/A 04/01/2015   Procedure: CORONARY ARTERY BYPASS GRAFTING (CABG) x one , using left mammary artery;  Surgeon: Ivin Poot, MD;  Location: [place];  Service: Open Heart Surgery;  Laterality: N/A;   CYSTOSCOPY/URETEROSCOPY/HOLMIUM LASER/STENT PLACEMENT Left 07/06/2020   Procedure: CYSTOSCOPY/URETEROSCOPY/STENT PLACEMENT/ retrograde pyelogram;  Surgeon: Abbie Sons, MD;  Location: [facility];  Service: Urology;  Laterality: Left;   CYSTOSCOPY/URETEROSCOPY/HOLMIUM LASER/STENT PLACEMENT Left 07/27/2020   Procedure: CYSTOSCOPY/URETEROSCOPY/HOLMIUM LASER/STENT EXCHANGE;  Surgeon: Abbie Sons, MD;  Location: [facility] OR[facility];  Service: Urology;  Laterality: Left;   JOINT REPLACEMENT Right    Total Knee Replacement   TEE WITHOUT CARDIOVERSION N/A 04/01/2015   Procedure: TRANSESOPHAGEAL ECHOCARDIOGRAM (TEE);  Surgeon: Ivin Poot, MD;  Location: [place];  Service: Open Heart Surgery;  Laterality: N/A;   TONSILLECTOMY     TOTAL KNEE ARTHROPLASTY Right 06/10/2014   Procedure: TOTAL KNEE ARTHROPLASTY;  Surgeon: Dereck Leep, MD;  Location: [facility] OR[facility];  Service: Orthopedics;  Laterality: Right;   TOTAL KNEE ARTHROPLASTY Left 09/02/2014   Procedure: TOTAL KNEE ARTHROPLASTY;  Surgeon: Dereck Leep, MD;  Location: [facility];  Service: Orthopedics;  Laterality: Left;    Home Medications:  Allergies as of 06/28/2021   No Known Allergies      Medication List        Accurate as of Jun 27, 2021 12:51 PM. If you have any questions, ask your nurse or doctor.          allopurinol 100 MG tablet Commonly known as: ZYLOPRIM Take 100 mg by mouth daily.   aspirin EC 81 MG tablet Take 81 mg by mouth daily.   atorvastatin 80 MG tablet Commonly known as: LIPITOR Take 80 mg by mouth every morning.   B-1 500 MG Tabs Take 500 mg by mouth daily.   carboxymethylcellul-glycerin 0.5-0.9 % ophthalmic solution Commonly known as: REFRESH OPTIVE Apply to eye.   celecoxib 200 MG  capsule Commonly known as: CELEBREX Take 200 mg by mouth 2 (two) times daily.   clonazePAM 1 MG tablet Commonly known as: KLONOPIN every evening.   colchicine 0.6 MG tablet Take 0.6 mg by mouth 2 (two) times daily as needed (gout).   cyclobenzaprine 10 MG tablet Commonly known as: FLEXERIL Take 10 mg by mouth daily as needed.   donepezil 5 MG tablet Commonly known as: ARICEPT Take 5 mg by mouth at bedtime.   estradiol 0.1 MG/GM vaginal cream Commonly known as: ESTRACE VAGINAL Apply 0.66m (pea-sized amount)  just inside the vaginal introitus with a finger-tip on Monday, Wednesday and Friday nights.   FLUoxetine 40 MG capsule Commonly known as: PROZAC Take 40 mg by mouth daily.   HYDROcodone-acetaminophen 5-325 MG tablet Commonly known as: NORCO/VICODIN Take 1 tablet by mouth every 6 (six) hours as needed for moderate pain.   hydroxychloroquine 200 MG tablet Commonly known  as: PLAQUENIL Take 200 mg by mouth 2 (two) times daily.   lamoTRIgine 25 MG tablet Commonly known as: LAMICTAL TAKE 2 TABLETS BY MOUTH EVERY DAY What changed:  how much to take when to take this   levofloxacin 500 MG tablet Commonly known as: LEVAQUIN Take 500 mg by mouth daily.   levothyroxine 25 MCG tablet Commonly known as: SYNTHROID Take 25 mcg by mouth daily before breakfast.   meloxicam 15 MG tablet Commonly known as: MOBIC Take 15 mg by mouth daily.   mirabegron ER 25 MG Tb24 tablet Commonly known as: MYRBETRIQ Take 1 tablet (25 mg total) by mouth daily.   montelukast 10 MG tablet Commonly known as: SINGULAIR Take 10 mg by mouth at bedtime.   pantoprazole 40 MG tablet Commonly known as: PROTONIX Take 1 tablet by mouth daily.   sucralfate 1 g tablet Commonly known as: CARAFATE Take 1 g by mouth 4 (four) times daily as needed (heartburn).   triamcinolone cream 0.1 % Commonly known as: KENALOG Apply topically.   trimethoprim 100 MG tablet Commonly known as: TRIMPEX Take  1 tablet (100 mg total) by mouth daily.   vitamin B-12 1000 MCG tablet Commonly known as: CYANOCOBALAMIN Take 1,000 mcg by mouth daily.   VITAMIN B-2 PO Take 400 mg by mouth daily.   Vitamin D3 50 MCG (2000 UT) capsule Take 2,000 Units by mouth daily.   zolpidem 5 MG tablet Commonly known as: AMBIEN Take 5 mg by mouth at bedtime.        Allergies: No Known Allergies  Family History: Family History  Problem Relation Age of Onset   Leukemia Mother    Bone cancer Father    Heart disease Brother    Heart disease Brother    Diabetes Brother    Hypertension Brother    Diabetes Brother    Hypertension Brother    Heart disease Brother    Breast cancer Paternal Aunt     Social History:  reports that she quit smoking about 29 years ago. Her smoking use included cigarettes. She smoked an average of 1 pack per day. She has never used smokeless tobacco. She reports that she does not drink alcohol and does not use drugs.  ROS: Pertinent ROS in HPI  Physical Exam: There were no vitals taken for this visit.  Constitutional:  Well nourished. Alert and oriented, No acute distress. HEENT: [place] AT, moist mucus membranes.  Trachea midline, no masses. Cardiovascular: No clubbing, cyanosis, or edema. Respiratory: Normal respiratory effort, no increased work of breathing. Neurologic: Grossly intact, no focal deficits, moving all 4 extremities. Psychiatric: Normal mood and affect  Laboratory Data: Color Yellow, Violet, Light Violet, Dark Violet Yellow   Clarity Clear, Other Clear   Specific Gravity 1.000 - 1.030 >=1.030   pH, Urine 5.0 - 8.0 6.0   Protein, Urinalysis Negative, Trace mg/dL 30 Abnormal    Glucose, Urinalysis Negative mg/dL Negative   Ketones, Urinalysis Negative mg/dL Negative   Blood, Urinalysis Negative Negative   Nitrite, Urinalysis Negative Negative   Leukocyte Esterase, Urinalysis Negative Negative   White Blood Cells, Urinalysis None Seen, 0-3 /hpf None Seen    Red Blood Cells, Urinalysis None Seen, 0-3 /hpf None Seen   Bacteria, Urinalysis None Seen /hpf Few Abnormal    Squamous Epithelial Cells, Urinalysis Rare, Few, None Seen /hpf Few   Resulting Agency  [facility]  Specimen Collected: 04/26/21 12:06 Last Resulted: 04/26/21 14:03  Received From: [facility]  Result Received: 04/27/21 08:50    I have reviewed the labs.   Pertinent Imaging: N/A    Assessment & Plan:    1. Dysuria -***  2. Vaginal atrophy -continue vaginal estrogen cream 3 nights weekly  3. Urge incontinence -continue oxybutynin XL 10 mg daily  4. rUTI's - ***   No follow-ups on file.  These notes generated with voice recognition software. I apologize for typographical errors.  Zara Council, PA-C  [facility] [address]  [place], [place] [address] [phone]

## 2021-06-28 ENCOUNTER — Ambulatory Visit: Payer: Medicare HMO | Admitting: Urology

## 2021-06-28 ENCOUNTER — Telehealth: Payer: Self-pay | Admitting: Urology

## 2021-06-28 DIAGNOSIS — N3941 Urge incontinence: Secondary | ICD-10-CM

## 2021-06-28 DIAGNOSIS — N952 Postmenopausal atrophic vaginitis: Secondary | ICD-10-CM

## 2021-06-28 DIAGNOSIS — N390 Urinary tract infection, site not specified: Secondary | ICD-10-CM

## 2021-06-28 DIAGNOSIS — R300 Dysuria: Secondary | ICD-10-CM

## 2021-06-28 NOTE — Telephone Encounter (Signed)
Pt's husband had a stroke and she wants to cancel appt for 3:30 today and will call back to r/s when she figures out everything with him.

## 2021-09-15 IMAGING — MG MM BREAST BX W/ LOC DEV 1ST LESION IMAGE BX SPEC STEREO GUIDE*R*
7 of 13 series · 7 of 29 positions shown · non-contrast
Comparison: Previous exam(s).
COMPARISON: Previous exam(s).

Addendum:
CLINICAL DATA: 68-year-old female presenting for stereotactic
biopsy of 2 asymmetries in the right breast.

EXAM:
ULTRASOUND GUIDED RIGHT BREAST CORE NEEDLE BIOPSY

[R (1 of 6)]
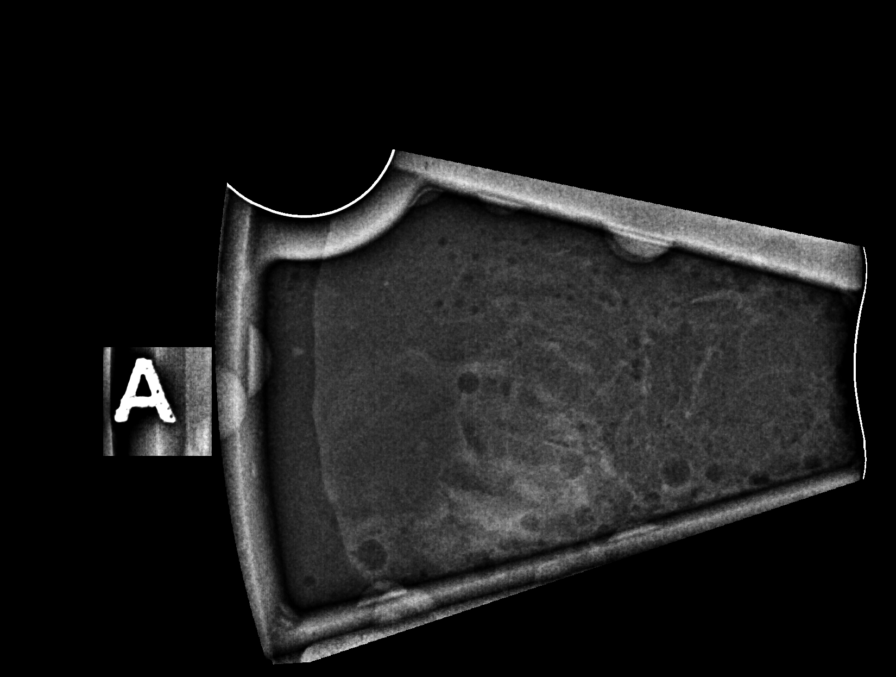

[R (2 of 6)]
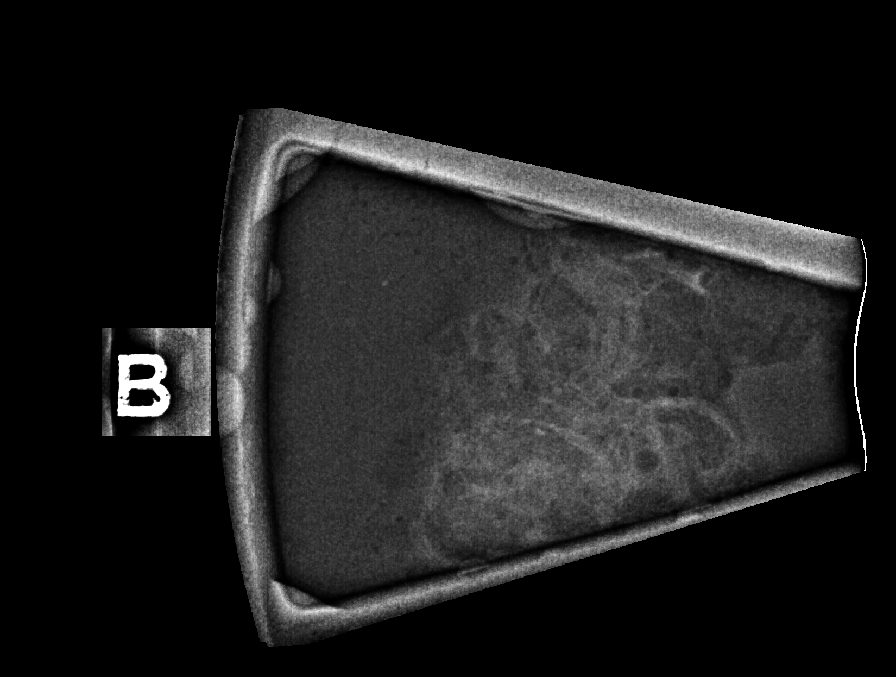

[R (3 of 6)]
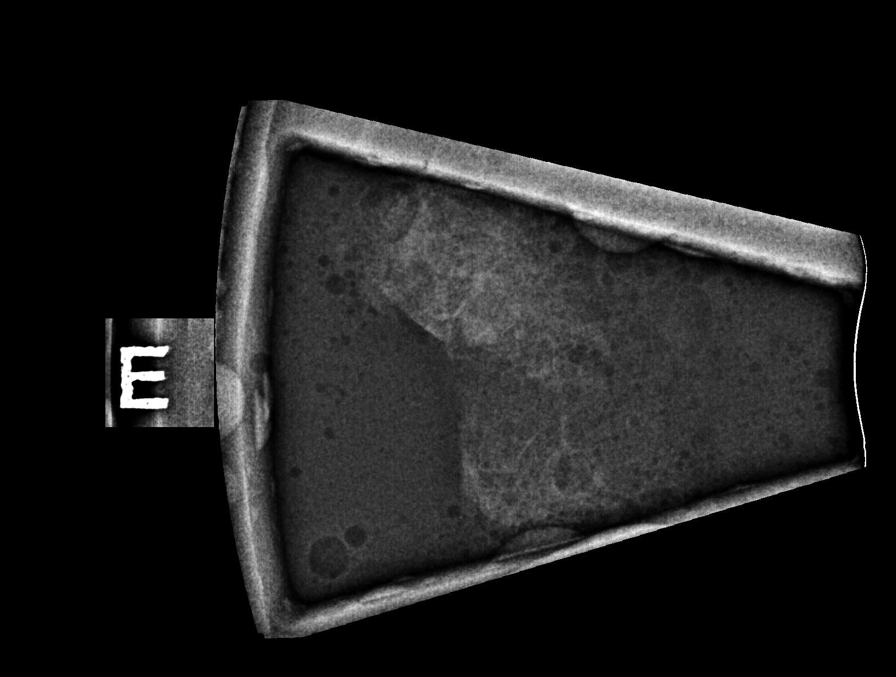

[R (4 of 6)]
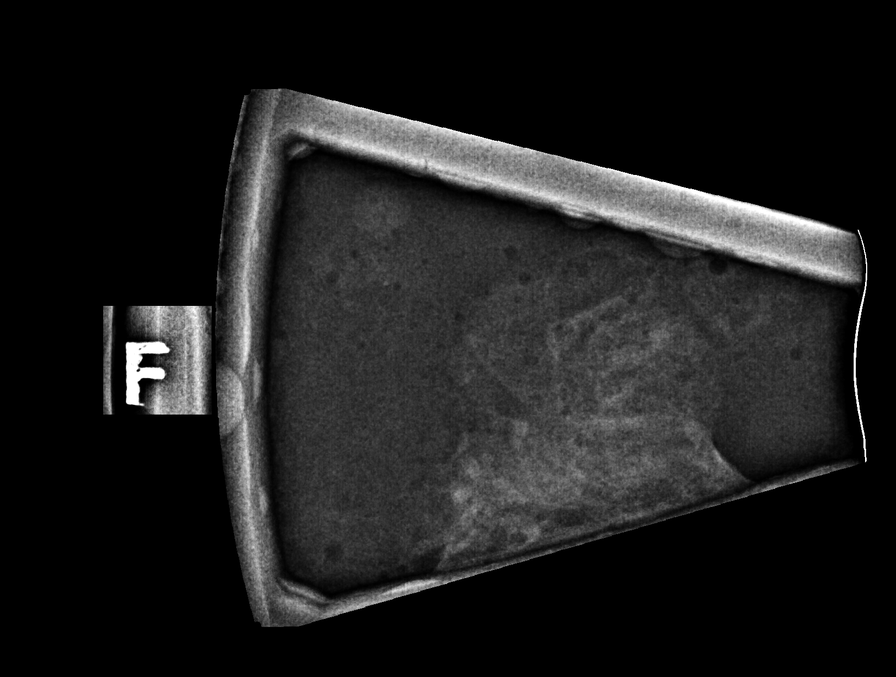

[R (5 of 6)]
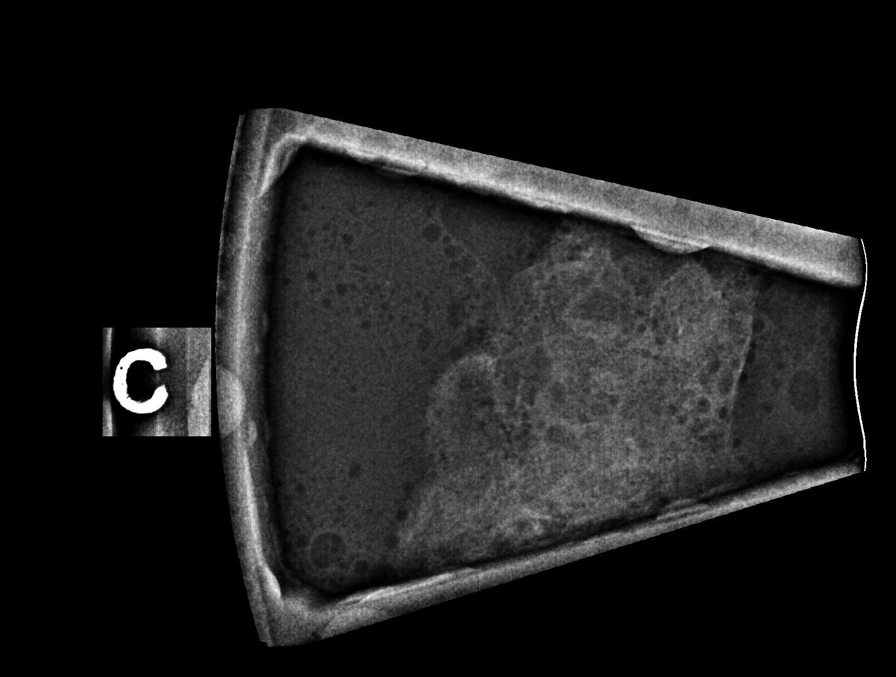

[R (6 of 6)]
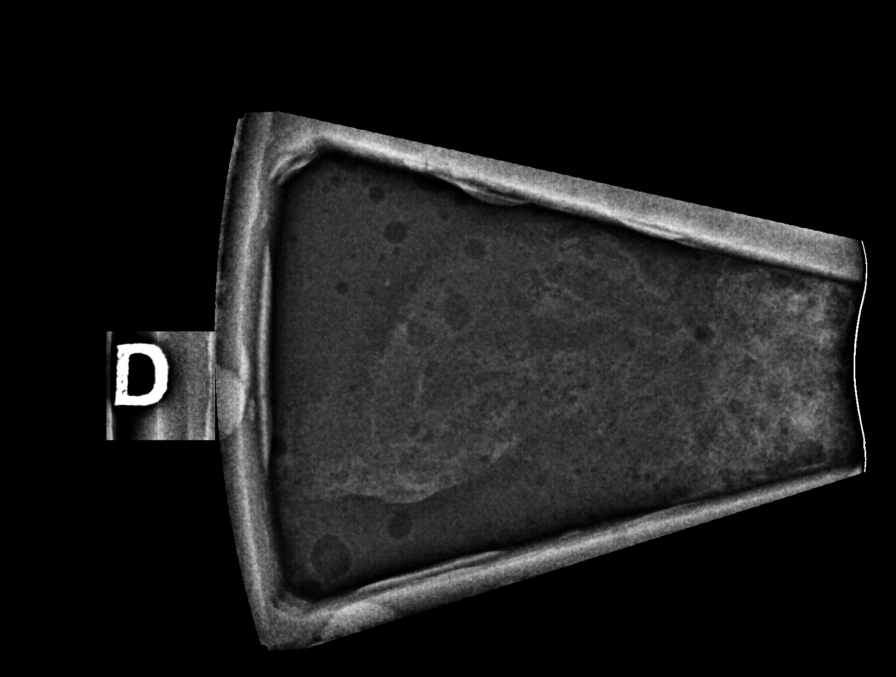

[R LM]
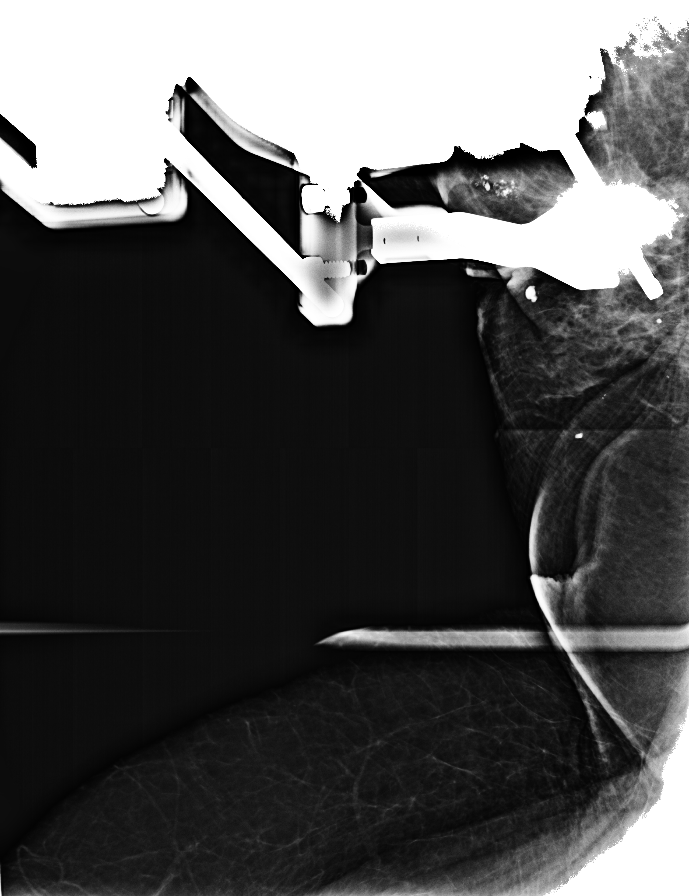

[7 of 29 positions shown; findings below may reference images not displayed]



Lesion quadrant: Upper-outer quadrant

Using sterile technique and 1% Lidocaine as local anesthetic, under
direct ultrasound visualization, a 14 gauge Nizo device was
used to perform biopsy of the more superior asymmetry in the upper
outer posterior right breast using a lateral approach. At the
conclusion of the procedure coil shaped tissue marker clip was
deployed into the biopsy cavity.

Lesion quadrant: Upper-outer quadrant

Using sterile technique and 1% Lidocaine as local anesthetic, under
direct ultrasound visualization, a 14 gauge Nizo device was
used to perform biopsy of the more inferior asymmetry in the
upper-outer posterior right breast using a lateral approach. At the
conclusion of the procedure X shaped tissue marker clip was deployed
into the biopsy cavity. Follow up 2 view mammogram was performed and
dictated separately.
IMPRESSION: Ultrasound guided biopsy of 2 asymmetries in the upper-outer
quadrant of the right breast. No apparent complications.

ADDENDUM:
PATHOLOGY revealed: A. BREAST, RIGHT UPPER OUTER QUADRANT, SUPERIOR;
STEREOTACTIC BIOPSY: - BENIGN FIBROADIPOSE TISSUE WITH FOCI OF FAT
NECROSIS. - NEGATIVE FOR ATYPIA AND MALIGNANCY.

B. BREAST, RIGHT UPPER OUTER QUADRANT, INFERIOR; STEREOTACTIC
BIOPSY: - BENIGN FIBROADIPOSE TISSUE WITH FAT NECROSIS. - NEGATIVE
FOR ATYPIA AND MALIGNANCY.

Pathology results are CONCORDANT with imaging findings, per Dr.
Plies Khor.

I telephoned the patient on 01/08/2019 and discussed biopsy results
and recommendations stated below. All questions were answered. The
patient denies significant pain or bleeding from the biopsy site.
Biopsy site care instructions were reviewed and patient was
instructed to call [HOSPITAL] with any concerns or
questions related to the biopsy.

Recommendation: Bilateral breast MRI recommended for further
evaluation of "shrinking breast" and new right breast mammographic
changes. The patient was also instructed to return in six months for
unilateral RIGHT breast diagnostic mammogram to monitor the biopsy
sites. Patient informed a reminder notice will be sent regarding
this appointment.

Addendum by Lino Rhoades RN on 01/08/2019.



Lesion quadrant: Upper-outer quadrant

Using sterile technique and 1% Lidocaine as local anesthetic, under
direct ultrasound visualization, a 14 gauge Nizo device was
used to perform biopsy of the more superior asymmetry in the upper
outer posterior right breast using a lateral approach. At the
conclusion of the procedure coil shaped tissue marker clip was
deployed into the biopsy cavity.

Lesion quadrant: Upper-outer quadrant

Using sterile technique and 1% Lidocaine as local anesthetic, under
direct ultrasound visualization, a 14 gauge Nizo device was
used to perform biopsy of the more inferior asymmetry in the
upper-outer posterior right breast using a lateral approach. At the
conclusion of the procedure X shaped tissue marker clip was deployed
into the biopsy cavity. Follow up 2 view mammogram was performed and
dictated separately.
IMPRESSION: Ultrasound guided biopsy of 2 asymmetries in the upper-outer
quadrant of the right breast. No apparent complications.

## 2021-09-27 ENCOUNTER — Encounter: Payer: Self-pay | Admitting: Ophthalmology

## 2021-10-04 NOTE — Discharge Instructions (Signed)

## 2021-10-05 ENCOUNTER — Ambulatory Visit (AMBULATORY_SURGERY_CENTER): Payer: Medicare HMO | Admitting: Anesthesiology

## 2021-10-05 ENCOUNTER — Encounter
Admission: RE | Disposition: A | Discharge status: Home / Self Care | Payer: Self-pay | Source: Home / Self Care | Attending: Ophthalmology

## 2021-10-05 ENCOUNTER — Encounter: Payer: Self-pay | Admitting: Ophthalmology

## 2021-10-05 ENCOUNTER — Other Ambulatory Visit: Payer: Self-pay

## 2021-10-05 ENCOUNTER — Ambulatory Visit
Admission: RE | Admit: 2021-10-05 | Discharge: 2021-10-05 | Disposition: A | Discharge status: Home / Self Care | Payer: Medicare HMO | Attending: Ophthalmology | Admitting: Ophthalmology

## 2021-10-05 ENCOUNTER — Ambulatory Visit: Payer: Medicare HMO | Admitting: Anesthesiology

## 2021-10-05 DIAGNOSIS — Z6833 Body mass index (BMI) 33.0-33.9, adult: Secondary | ICD-10-CM | POA: Insufficient documentation

## 2021-10-05 DIAGNOSIS — G4700 Insomnia, unspecified: Secondary | ICD-10-CM | POA: Diagnosis not present

## 2021-10-05 DIAGNOSIS — R569 Unspecified convulsions: Secondary | ICD-10-CM | POA: Diagnosis not present

## 2021-10-05 DIAGNOSIS — I509 Heart failure, unspecified: Secondary | ICD-10-CM | POA: Diagnosis not present

## 2021-10-05 DIAGNOSIS — I110 Hypertensive heart disease with heart failure: Secondary | ICD-10-CM | POA: Diagnosis not present

## 2021-10-05 DIAGNOSIS — N189 Chronic kidney disease, unspecified: Secondary | ICD-10-CM | POA: Diagnosis not present

## 2021-10-05 DIAGNOSIS — E039 Hypothyroidism, unspecified: Secondary | ICD-10-CM | POA: Diagnosis not present

## 2021-10-05 DIAGNOSIS — F32A Depression, unspecified: Secondary | ICD-10-CM | POA: Insufficient documentation

## 2021-10-05 DIAGNOSIS — M3500 Sicca syndrome, unspecified: Secondary | ICD-10-CM | POA: Diagnosis not present

## 2021-10-05 DIAGNOSIS — E538 Deficiency of other specified B group vitamins: Secondary | ICD-10-CM | POA: Diagnosis not present

## 2021-10-05 DIAGNOSIS — E785 Hyperlipidemia, unspecified: Secondary | ICD-10-CM | POA: Diagnosis not present

## 2021-10-05 DIAGNOSIS — Z951 Presence of aortocoronary bypass graft: Secondary | ICD-10-CM | POA: Diagnosis not present

## 2021-10-05 DIAGNOSIS — F419 Anxiety disorder, unspecified: Secondary | ICD-10-CM | POA: Diagnosis not present

## 2021-10-05 DIAGNOSIS — Z923 Personal history of irradiation: Secondary | ICD-10-CM | POA: Insufficient documentation

## 2021-10-05 DIAGNOSIS — I69354 Hemiplegia and hemiparesis following cerebral infarction affecting left non-dominant side: Secondary | ICD-10-CM | POA: Insufficient documentation

## 2021-10-05 DIAGNOSIS — J449 Chronic obstructive pulmonary disease, unspecified: Secondary | ICD-10-CM | POA: Diagnosis not present

## 2021-10-05 DIAGNOSIS — H2512 Age-related nuclear cataract, left eye: Secondary | ICD-10-CM | POA: Diagnosis present

## 2021-10-05 DIAGNOSIS — I2510 Atherosclerotic heart disease of native coronary artery without angina pectoris: Secondary | ICD-10-CM | POA: Diagnosis not present

## 2021-10-05 DIAGNOSIS — K219 Gastro-esophageal reflux disease without esophagitis: Secondary | ICD-10-CM | POA: Insufficient documentation

## 2021-10-05 DIAGNOSIS — M1990 Unspecified osteoarthritis, unspecified site: Secondary | ICD-10-CM | POA: Insufficient documentation

## 2021-10-05 DIAGNOSIS — Z853 Personal history of malignant neoplasm of breast: Secondary | ICD-10-CM | POA: Diagnosis not present

## 2021-10-05 DIAGNOSIS — E669 Obesity, unspecified: Secondary | ICD-10-CM | POA: Diagnosis not present

## 2021-10-05 DIAGNOSIS — Z87891 Personal history of nicotine dependence: Secondary | ICD-10-CM | POA: Diagnosis not present

## 2021-10-05 DIAGNOSIS — I130 Hypertensive heart and chronic kidney disease with heart failure and stage 1 through stage 4 chronic kidney disease, or unspecified chronic kidney disease: Secondary | ICD-10-CM | POA: Insufficient documentation

## 2021-10-05 HISTORY — PX: CATARACT EXTRACTION W/PHACO: SHX586

## 2021-10-05 SURGERY — PHACOEMULSIFICATION, CATARACT, WITH IOL INSERTION
Anesthesia: Monitor Anesthesia Care | Site: Eye | Laterality: Left

## 2021-10-05 MED ORDER — ONDANSETRON HCL 4 MG/2ML IJ SOLN
4.0000 mg | Freq: Once | INTRAMUSCULAR | Status: DC | PRN
Start: 2021-10-05 — End: 2021-10-05

## 2021-10-05 MED ORDER — ARMC OPHTHALMIC DILATING DROPS
1.0000 | OPHTHALMIC | Status: DC | PRN
  Administered 2021-10-05 (×3): 1 via OPHTHALMIC

## 2021-10-05 MED ORDER — SIGHTPATH DOSE#1 BSS IO SOLN
INTRAOCULAR | Status: DC | PRN
  Administered 2021-10-05: 47 mL via OPHTHALMIC

## 2021-10-05 MED ORDER — BRIMONIDINE TARTRATE-TIMOLOL 0.2-0.5 % OP SOLN
OPHTHALMIC | Status: DC | PRN
  Administered 2021-10-05: 1 [drp] via OPHTHALMIC

## 2021-10-05 MED ORDER — SIGHTPATH DOSE#1 BSS IO SOLN
INTRAOCULAR | Status: DC | PRN
  Administered 2021-10-05: 15 mL via INTRAOCULAR

## 2021-10-05 MED ORDER — CEFUROXIME OPHTHALMIC INJECTION 1 MG/0.1 ML
INJECTION | OPHTHALMIC | Status: DC | PRN
  Administered 2021-10-05: 0.1 mL via INTRACAMERAL

## 2021-10-05 MED ORDER — SIGHTPATH DOSE#1 BSS IO SOLN
INTRAOCULAR | Status: DC | PRN
  Administered 2021-10-05: 2 mL

## 2021-10-05 MED ORDER — FENTANYL CITRATE (PF) 100 MCG/2ML IJ SOLN
INTRAMUSCULAR | Status: DC | PRN
Start: 2021-10-05 — End: 2021-10-05
  Administered 2021-10-05 (×2): 50 ug via INTRAVENOUS

## 2021-10-05 MED ORDER — TETRACAINE HCL 0.5 % OP SOLN
1.0000 [drp] | OPHTHALMIC | Status: DC | PRN
  Administered 2021-10-05 (×3): 1 [drp] via OPHTHALMIC

## 2021-10-05 MED ORDER — SIGHTPATH DOSE#1 NA HYALUR & NA CHOND-NA HYALUR IO KIT
PACK | INTRAOCULAR | Status: DC | PRN
  Administered 2021-10-05: 1 via OPHTHALMIC

## 2021-10-05 MED ORDER — MIDAZOLAM HCL 2 MG/2ML IJ SOLN
INTRAMUSCULAR | Status: DC | PRN
  Administered 2021-10-05: 1 mg via INTRAVENOUS

## 2021-10-05 SURGICAL SUPPLY — 13 items
CANNULA ANT/CHMB 27G (MISCELLANEOUS) IMPLANT
CANNULA ANT/CHMB 27GA (MISCELLANEOUS) IMPLANT
CATARACT SUITE SIGHTPATH (MISCELLANEOUS) ×1 IMPLANT
FEE CATARACT SUITE SIGHTPATH (MISCELLANEOUS) ×1 IMPLANT
GLOVE SRG 8 PF TXTR STRL LF DI (GLOVE) ×1 IMPLANT
GLOVE SURG ENC TEXT LTX SZ7.5 (GLOVE) ×1 IMPLANT
GLOVE SURG UNDER POLY LF SZ8 (GLOVE) ×1
LENS IOL TECNIS EYHANCE 15.5 (Intraocular Lens) IMPLANT
NDL FILTER BLUNT 18X1 1/2 (NEEDLE) ×1 IMPLANT
NEEDLE FILTER BLUNT 18X 1/2SAF (NEEDLE) ×1
NEEDLE FILTER BLUNT 18X1 1/2 (NEEDLE) ×1 IMPLANT
SYR 3ML LL SCALE MARK (SYRINGE) ×1 IMPLANT
WATER STERILE IRR 250ML POUR (IV SOLUTION) ×1 IMPLANT

## 2021-10-05 NOTE — Transfer of Care (Signed)
Immediate Anesthesia Transfer of Care Note  Patient: Tonya Walton  Procedure(s) Performed: CATARACT EXTRACTION PHACO AND INTRAOCULAR LENS PLACEMENT (IOC) LEFT 7.38 00:52.6 (Left: Eye)  Patient Location: PACU  Anesthesia Type: MAC  Level of Consciousness: awake, alert  and patient cooperative  Airway and Oxygen Therapy: Patient Spontanous Breathing and Patient connected to supplemental oxygen  Post-op Assessment: Post-op Vital signs reviewed, Patient's Cardiovascular Status Stable, Respiratory Function Stable, Patent Airway and No signs of Nausea or vomiting  Post-op Vital Signs: Reviewed and stable  Complications: There were no known notable events for this encounter.

## 2021-10-05 NOTE — Op Note (Signed)
OPERATIVE NOTE  Tonya Walton [id number] 10/05/2021   PREOPERATIVE DIAGNOSIS:  Nuclear sclerotic cataract left eye. H25.12   POSTOPERATIVE DIAGNOSIS:    Nuclear sclerotic cataract left eye.     PROCEDURE:  Phacoemusification with posterior chamber intraocular lens placement of the left eye  Ultrasound time: Procedure(s): CATARACT EXTRACTION PHACO AND INTRAOCULAR LENS PLACEMENT (IOC) LEFT 7.38 00:52.6 (Left)  LENS:   Implant Name Type Inv. Item Serial No. Manufacturer Lot No. LRB No. Used Action  LENS IOL TECNIS EYHANCE 15.5 - [id number] Intraocular Lens LENS IOL TECNIS EYHANCE 15.5 [id number] [employer]  Left 1 Implanted      SURGEON:  Wyonia Hough, MD   ANESTHESIA:  Topical with tetracaine drops and 2% Xylocaine jelly, augmented with 1% preservative-free intracameral lidocaine.    COMPLICATIONS:  None.   DESCRIPTION OF PROCEDURE:  The patient was identified in the holding room and transported to the operating room and placed in the supine position under the operating microscope.  The left eye was identified as the operative eye and it was prepped and draped in the usual sterile ophthalmic fashion.   A 1 millimeter clear-corneal paracentesis was made at the 1:30 position.  0.5 ml of preservative-free 1% lidocaine was injected into the anterior chamber.  The anterior chamber was filled with Viscoat viscoelastic.  A 2.4 millimeter keratome was used to make a near-clear corneal incision at the 10:30 position.  .  A curvilinear capsulorrhexis was made with a cystotome and capsulorrhexis forceps.  Balanced salt solution was used to hydrodissect and hydrodelineate the nucleus.   Phacoemulsification was then used in stop and chop fashion to remove the lens nucleus and epinucleus.  The remaining cortex was then removed using the irrigation and aspiration handpiece. Provisc was then placed into the capsular bag to distend it for lens placement.  A lens was then injected into the  capsular bag.  The remaining viscoelastic was aspirated.   Wounds were hydrated with balanced salt solution.  The anterior chamber was inflated to a physiologic pressure with balanced salt solution.  No wound leaks were noted. Cefuroxime 0.1 ml of a '10mg'$ /ml solution was injected into the anterior chamber for a dose of 1 mg of intracameral antibiotic at the completion of the case.   Timolol and Brimonidine drops were applied to the eye.  The patient was taken to the recovery room in stable condition without complications of anesthesia or surgery.  [name] 10/05/2021, 10:14 AM

## 2021-10-05 NOTE — Anesthesia Preprocedure Evaluation (Signed)
Anesthesia Evaluation  Patient identified by MRN, date of birth, ID band Patient awake    Reviewed: Allergy & Precautions, NPO status , Patient's Chart, lab work & pertinent test results  History of Anesthesia Complications Negative for: history of anesthetic complications  Airway Mallampati: III   Neck ROM: Full    Dental  (+) Upper Dentures   Pulmonary COPD, former smoker,    Pulmonary exam normal breath sounds clear to auscultation       Cardiovascular hypertension, + CAD (s/p CABG), + CABG and +CHF  Normal cardiovascular exam+ dysrhythmias (a fib)  Rhythm:Regular Rate:Normal  ECG 04/07/20: NSR, nonspecific ST and T wave abnormalities   Neuro/Psych  Headaches, Seizures -,  PSYCHIATRIC DISORDERS Anxiety Depression CVA (residual left-sided weakness), No Residual Symptoms    GI/Hepatic GERD  ,  Endo/Other  Hypothyroidism Obesity   Renal/GU negative Renal ROS     Musculoskeletal  (+) Arthritis , Sjogren syndrome   Abdominal   Peds  Hematology negative hematology ROS (+)   Anesthesia Other Findings Past Medical History: No date: Anxiety No date: Arthritis No date: Atrial fibrillation (HCC) No date: CHF (congestive heart failure) (HCC) No date: Chronic kidney disease No date: COPD (chronic obstructive pulmonary disease) (HCC) No date: Coronary artery disease No date: Depression No date: Family history of adverse reaction to anesthesia     Comment:  brothers had a lung collaspe during an unknown               procedure. No date: GERD (gastroesophageal reflux disease) No date: Headache No date: History of kidney stones 04/01/2015: Hx of CABG     Comment:  Single vessel; LIMA-LAD No date: Hyperlipidemia No date: Hypertension No date: Hypothyroidism No date: Insomnia 09/27/2010: Invasive ductal carcinoma of breast, female, right ([place])     Comment:  Stage 1A; ER/PR (+) and HER2/neu indeterminant; pT1b pN0               (sn) No date: Neuromuscular disorder (HCC)     Comment:  nerve damage in legs 2012: Personal history of radiation therapy     Comment:  BREAST CA No date: Restless leg syndrome No date: Seizures (HCC) No date: Shortness of breath dyspnea No date: Sjogren's syndrome (HCC) No date: Stroke [facility])     Comment:  Some mild weakness in left side residual No date: Vitamin B12 deficiency   Reproductive/Obstetrics                             Anesthesia Physical  Anesthesia Plan  ASA: 3  Anesthesia Plan: MAC   Post-op Pain Management:    Induction: Intravenous  PONV Risk Score and Plan: 2 and Midazolam  Airway Management Planned: Nasal Cannula  Additional Equipment:   Intra-op Plan:   Post-operative Plan:   Informed Consent: I have reviewed the patients History and Physical, chart, labs and discussed the procedure including the risks, benefits and alternatives for the proposed anesthesia with the patient or authorized representative who has indicated his/her understanding and acceptance.       Plan Discussed with: CRNA and Surgeon  Anesthesia Plan Comments: (Explained risks of anesthesia, including PONV, and rare emergencies such as cardiac events, respiratory problems, and allergic reactions, requiring invasive intervention. Discussed the role of CRNA in patient's perioperative care. Patient understands. )        Anesthesia Quick Evaluation

## 2021-10-05 NOTE — H&P (Signed)
[place]   Primary Care Physician:  Rusty Aus, MD Ophthalmologist: Dr. Leandrew Koyanagi  Pre-Procedure History & Physical: HPI:  Tonya Walton is a 71 y.o. female here for ophthalmic surgery.   Past Medical History:  Diagnosis Date   Anxiety    Arthritis    Atrial fibrillation (HCC)    CHF (congestive heart failure) (HCC)    Chronic kidney disease    COPD (chronic obstructive pulmonary disease) (HCC)    Coronary artery disease    Depression    Family history of adverse reaction to anesthesia    brothers had a lung collaspe during an unknown procedure.   GERD (gastroesophageal reflux disease)    Headache    History of kidney stones    Hx of CABG 04/01/2015   Single vessel; LIMA-LAD   Hyperlipidemia    Hypertension    Hypothyroidism    Insomnia    Invasive ductal carcinoma of breast, female, right ([place]) 09/27/2010   Stage 1A; ER/PR (+) and HER2/neu indeterminant; pT1b pN0 (sn)   Neuromuscular disorder (HCC)    nerve damage in legs   Personal history of radiation therapy 2012   BREAST CA   Restless leg syndrome    Seizures (HCC)    Shortness of breath dyspnea    Sjogren's syndrome (HCC)    Stroke (HCC)    Some mild weakness in left side residual   Vitamin B12 deficiency     Past Surgical History:  Procedure Laterality Date   APPENDECTOMY     BREAST BIOPSY Left 07/2013   NEG   BREAST BIOPSY Right 08/22/2016   benign   BREAST BIOPSY Right 01/07/2019   asymmetry bx with stereo, coil marker,fat necrosis   BREAST BIOPSY Right 01/07/2019   asymm affirm bx,  x marker,  fat necrosis   BREAST BIOPSY Right 03/25/2020   stereo bx, ribbon clip, path pending    BREAST EXCISIONAL BIOPSY Right 2012   POS   BREAST LUMPECTOMY  2012   Right breast   CARDIAC CATHETERIZATION N/A 03/02/2015   Procedure: Left Heart Cath and Coronary Angiography;  Surgeon: Teodoro Spray, MD;  Location: [place] [facility];  Service: Cardiovascular;  Laterality: N/A;    CATARACT EXTRACTION W/PHACO Right 10/14/2014   Procedure: CATARACT EXTRACTION PHACO AND INTRAOCULAR LENS PLACEMENT (IOC);  Surgeon: Leandrew Koyanagi, MD;  Location: [place];  Service: Ophthalmology;  Laterality: Right;   COLONOSCOPY     COLONOSCOPY WITH PROPOFOL N/A 02/17/2021   Procedure: COLONOSCOPY WITH PROPOFOL;  Surgeon: Annamaria Helling, DO;  Location: [facility];  Service: Gastroenterology;  Laterality: N/A;   CORONARY ARTERY BYPASS GRAFT N/A 04/01/2015   Procedure: CORONARY ARTERY BYPASS GRAFTING (CABG) x one , using left mammary artery;  Surgeon: Ivin Poot, MD;  Location: [place];  Service: Open Heart Surgery;  Laterality: N/A;   CYSTOSCOPY/URETEROSCOPY/HOLMIUM LASER/STENT PLACEMENT Left 07/06/2020   Procedure: CYSTOSCOPY/URETEROSCOPY/STENT PLACEMENT/ retrograde pyelogram;  Surgeon: Abbie Sons, MD;  Location: [facility];  Service: Urology;  Laterality: Left;   CYSTOSCOPY/URETEROSCOPY/HOLMIUM LASER/STENT PLACEMENT Left 07/27/2020   Procedure: CYSTOSCOPY/URETEROSCOPY/HOLMIUM LASER/STENT EXCHANGE;  Surgeon: Abbie Sons, MD;  Location: [facility];  Service: Urology;  Laterality: Left;   JOINT REPLACEMENT Right    Total Knee Replacement   TEE WITHOUT CARDIOVERSION N/A 04/01/2015   Procedure: TRANSESOPHAGEAL ECHOCARDIOGRAM (TEE);  Surgeon: Ivin Poot, MD;  Location: [place];  Service: Open Heart Surgery;  Laterality: N/A;   TONSILLECTOMY     TOTAL  KNEE ARTHROPLASTY Right 06/10/2014   Procedure: TOTAL KNEE ARTHROPLASTY;  Surgeon: Dereck Leep, MD;  Location: [facility];  Service: Orthopedics;  Laterality: Right;   TOTAL KNEE ARTHROPLASTY Left 09/02/2014   Procedure: TOTAL KNEE ARTHROPLASTY;  Surgeon: Dereck Leep, MD;  Location: [facility];  Service: Orthopedics;  Laterality: Left;    Prior to Admission medications   Medication Sig Start Date End Date Taking? Authorizing Provider  allopurinol (ZYLOPRIM) 100 MG tablet Take 100 mg by mouth 2 (two) times daily.  08/12/20  Yes [provider]  aspirin EC 81 MG tablet Take 81 mg by mouth daily. 07/23/08  Yes [provider]  atorvastatin (LIPITOR) 80 MG tablet Take 80 mg by mouth every morning. 06/13/20  Yes [provider]  carboxymethylcellul-glycerin (REFRESH OPTIVE) 0.5-0.9 % ophthalmic solution Apply to eye.   Yes [provider]  celecoxib (CELEBREX) 200 MG capsule Take 200 mg by mouth 2 (two) times daily. 08/25/20  Yes [provider]  Cholecalciferol (VITAMIN D3) 2000 units capsule Take 2,000 Units by mouth daily.    Yes [provider]  donepezil (ARICEPT) 5 MG tablet Take 5 mg by mouth at bedtime. 05/30/20  Yes [provider]  FLUoxetine (PROZAC) 40 MG capsule Take 40 mg by mouth daily.   Yes [provider]  hydroxychloroquine (PLAQUENIL) 200 MG tablet Take 200 mg by mouth 2 (two) times daily. 05/31/20  Yes [provider]  lamoTRIgine (LAMICTAL) 25 MG tablet TAKE 2 TABLETS BY MOUTH EVERY DAY Patient taking differently: Take 75 mg by mouth 2 (two) times daily. 07/22/18  Yes Rainey Pines, MD  levothyroxine (SYNTHROID, LEVOTHROID) 25 MCG tablet Take 25 mcg by mouth daily before breakfast.   Yes [provider]  meloxicam (MOBIC) 15 MG tablet Take 15 mg by mouth daily. 07/15/15  Yes [provider]  mirabegron ER (MYRBETRIQ) 25 MG TB24 tablet Take 1 tablet (25 mg total) by mouth daily. 09/10/20  Yes Stoioff, Ronda Fairly, MD  montelukast (SINGULAIR) 10 MG tablet Take 10 mg by mouth at bedtime.   Yes [provider]  pantoprazole (PROTONIX) 40 MG tablet Take 1 tablet by mouth daily. 07/29/20  Yes [provider]  Riboflavin (VITAMIN B-2 PO) Take 400 mg by mouth daily.   Yes [provider]  sucralfate (CARAFATE) 1 g tablet Take 1 g by mouth 4 (four) times daily as needed (heartburn).   Yes [provider]  Thiamine HCl (B-1) 500 MG TABS Take 500 mg by mouth daily.   Yes [provider]  trimethoprim (TRIMPEX) 100 MG tablet Take 1 tablet (100 mg total) by mouth daily. 04/11/21  Yes Stoioff, Ronda Fairly, MD  vitamin B-12 (CYANOCOBALAMIN) 1000 MCG tablet Take 1,000 mcg by mouth daily.   Yes [provider]  zolpidem (AMBIEN) 5 MG tablet Take 5 mg by mouth at bedtime. 02/02/17  Yes [provider]  clonazePAM (KLONOPIN) 1 MG tablet every evening. Patient not taking: Reported on 04/11/2021 09/06/20   [provider]  colchicine 0.6 MG tablet Take 0.6 mg by mouth 2 (two) times daily as needed (gout). 01/02/20   [provider]  cyclobenzaprine (FLEXERIL) 10 MG tablet Take 10 mg by mouth daily as needed.    [provider]  estradiol (ESTRACE VAGINAL) 0.1 MG/GM vaginal cream Apply 0.7m (pea-sized amount)  just inside the vaginal introitus with a finger-tip on Monday, Wednesday and Friday nights. Patient not taking: Reported on 09/27/2021 12/06/20   MErnestine Conrad  Larene Beach A, PA-C  HYDROcodone-acetaminophen (NORCO/VICODIN) 5-325 MG tablet Take 1 tablet by mouth every 6 (six) hours as needed for moderate pain. Patient not taking: Reported on 04/11/2021 07/06/20   Abbie Sons, MD  levofloxacin (LEVAQUIN) 500 MG tablet Take 500 mg by mouth daily. 04/06/21   [provider]  triamcinolone cream (KENALOG) 0.1 % Apply topically. 09/06/20   [provider]    Allergies as of 08/10/2021   (No Known Allergies)    Family History  Problem Relation Age of Onset   Leukemia Mother    Bone cancer Father    Heart disease Brother    Heart disease Brother    Diabetes Brother    Hypertension Brother    Diabetes Brother    Hypertension Brother    Heart disease Brother    Breast cancer Paternal Aunt     Social History   Socioeconomic History   Marital status: Married    Spouse name: Not on file   Number of children: Not on file   Years of education: Not on file   Highest education level: Not on file  Occupational History   Not  on file  Tobacco Use   Smoking status: Former    Packs/day: 1.00    Types: Cigarettes    Quit date: 04/30/1992    Years since quitting: 29.4   Smokeless tobacco: Never  Vaping Use   Vaping Use: Never used  Substance and Sexual Activity   Alcohol use: No   Drug use: No   Sexual activity: Yes    Partners: Male    Birth control/protection: None  Other Topics Concern   Not on file  Social History Narrative   Not on file   Social Determinants of Health   Financial Resource Strain: Not on file  Food Insecurity: Not on file  Transportation Needs: Not on file  Physical Activity: Not on file  Stress: Not on file  Social Connections: Not on file  Intimate Partner Violence: Not on file    Review of Systems: See HPI, otherwise negative ROS  Physical Exam: BP (!) 157/73   Pulse 65   Temp 97.9 F (36.6 C) (Temporal)   Ht _0  (1.676 m)   Wt 95.5 kg   SpO2 97%   BMI 33.98 kg/m  General:   Alert,  pleasant and cooperative in NAD Head:  Normocephalic and atraumatic. Lungs:  Clear to auscultation.    Heart:  Regular rate and rhythm.   Impression/Plan: Tonya Walton is here for ophthalmic surgery.  Risks, benefits, limitations, and alternatives regarding ophthalmic surgery have been reviewed with the patient.  Questions have been answered.  All parties agreeable.   Leandrew Koyanagi, MD  10/05/2021, 8:58 AM

## 2021-10-05 NOTE — Anesthesia Postprocedure Evaluation (Signed)
Anesthesia Post Note  Patient: Tonya Walton  Procedure(s) Performed: CATARACT EXTRACTION PHACO AND INTRAOCULAR LENS PLACEMENT (IOC) LEFT 7.38 00:52.6 (Left: Eye)     Patient location during evaluation: PACU Anesthesia Type: MAC Level of consciousness: awake and alert Pain management: pain level controlled Vital Signs Assessment: post-procedure vital signs reviewed and stable Respiratory status: spontaneous breathing, nonlabored ventilation, respiratory function stable and patient connected to nasal cannula oxygen Cardiovascular status: stable and blood pressure returned to baseline Postop Assessment: no apparent nausea or vomiting Anesthetic complications: no   There were no known notable events for this encounter.  Arita Miss

## 2021-10-06 ENCOUNTER — Encounter: Payer: Self-pay | Admitting: Ophthalmology

## 2021-10-19 ENCOUNTER — Other Ambulatory Visit: Payer: Self-pay | Admitting: Urology

## 2021-11-29 NOTE — Progress Notes (Signed)
 Chief Complaint  Patient presents with  . Ear pain (bilateral)    Right side worse Light headed    HPI  Tonya Walton is a 71 y.o. here for an acute issue of bilateral ear pain.  Right ear pain is greater than left.  Has felt a little off balance but no actual room spinning dizziness with lightheadedness.  Nuys any chest pains, palpitations, or shortness of breath.  Has had a cough minimally.  Symptoms started around Thursday last week and has not had any fevers or chills.  No nausea or vomiting.  Has not had any sinus congestion or drainage.   ROS  Review of systems is unremarkable for any active cardiac, respiratory, GI, GU, hematologic, neurologic, dermatologic, HEENT, or psychiatric symptoms except as noted above.  No fevers, chills, or constitutional symptoms.   Current Outpatient Medications: allopurinoL  (ZYLOPRIM ) 100 MG tablet, Take 1 tablet (100 mg total) by mouth once daily, Taking apixaban  (ELIQUIS ) 5 mg tablet, Take 1 tablet (5 mg total) by mouth every 12 (twelve) hours, Taking atorvastatin  (LIPITOR ) 80 MG tablet, Take 1 tablet (80 mg total) by mouth once daily, Taking carboxymethylcellulose-glycerin  (REFRESH OPTIVE) 0.5-0.9 % ophthalmic solution, Apply 1 drop to eye 2 (two) times daily, Taking cholecalciferol (VITAMIN D3) 2,000 unit capsule, Take 2,000 Units by mouth once daily., Taking clonazePAM  (KLONOPIN ) 1 MG tablet, every evening., Taking cyanocobalamin  (VITAMIN B12) 1000 MCG tablet, Take 1,000 mcg by mouth once daily, Taking dilTIAZem  (CARDIZEM  CD) 180 MG CD capsule, Take 1 capsule (180 mg total) by mouth once daily, Taking donepeziL  (ARICEPT ) 10 MG tablet, Take 1 tablet (10 mg total) by mouth at bedtime, Taking estradioL  (ESTRACE ) 0.01 % (0.1 mg/gram) vaginal cream, , Taking FLUoxetine  (PROZAC ) 40 MG capsule, Take 1 capsule (40 mg total) by mouth once daily, Taking hydrOXYchloroQUINE  (PLAQUENIL ) 200 mg tablet, Take 1 tablet (200 mg total) by mouth 2 (two) times daily,  Taking lamoTRIgine  (LAMICTAL ) 25 MG tablet, Take 3 tablets (75 mg total) by mouth 2 (two) times daily for 360 days, Taking levothyroxine  (SYNTHROID ) 25 MCG tablet, Take 1 tablet (25 mcg total) by mouth once daily Take on an empty stomach with a glass of water  at least 30-60 minutes before breakfast., Taking montelukast  (SINGULAIR ) 10 mg tablet, Take 1 tablet (10 mg total) by mouth once daily, Taking nitroGLYcerin  (NITROSTAT ) 0.4 MG SL tablet, Place 1 tablet (0.4 mg total) under the tongue every 5 (five) minutes as needed for Chest pain May take up to 3 doses., PRN Not Currently Taking pantoprazole  (PROTONIX ) 40 MG DR tablet, Take 1 tablet by mouth once daily, Taking triamcinolone  0.1 % cream, Apply topically, Taking zolpidem  (AMBIEN ) 5 MG tablet, Take 1 tablet (5 mg total) by mouth at bedtime, Taking azithromycin (ZITHROMAX) 250 MG tablet, Take 2 tablets (500mg ) by mouth on Day 1. Take 1 tablet (250mg ) by mouth on Days 2-5. fluticasone propionate (FLONASE) 50 mcg/actuation nasal spray, Place 2 sprays into both nostrils once daily  Allergies as of 11/29/2021  . (No Known Allergies)    Patient Active Problem List  Diagnosis  . Mixed hyperlipidemia  . COPD (chronic obstructive pulmonary disease) (CMS-HCC)  . History of breast cancer  . Status post total right knee replacement  . Paroxysmal A-fib (CMS-HCC)  . Status post total left knee replacement  . Coronary artery disease involving native coronary artery of native heart  . Medicare annual wellness visit, initial  . Seizure disorder (CMS-HCC)  . Vitamin B12 deficiency  . Peripheral neuropathy, idiopathic  .  Major depressive disorder, recurrent, mild (CMS-HCC)  . CAD in native artery  . Idiopathic chronic gout of foot without tophus  . Hematuria, gross  . History of CVA (cerebrovascular accident)  . Sjogren's syndrome without extraglandular involvement (CMS-HCC)  . Mild late onset Alzheimer's dementia without behavioral disturbance,  psychotic disturbance, mood disturbance, or anxiety (CMS-HCC)    Past Medical History:  Diagnosis Date  . Anxiety   . Atrial fibrillation (CMS-HCC)   . Breast cancer (CMS-HCC)   . Chronic kidney disease   . COPD (chronic obstructive pulmonary disease) (CMS-HCC)   . Depression   . GERD (gastroesophageal reflux disease)   . Hyperlipidemia   . Hypertension   . Hypothyroidism   . RLS (restless legs syndrome)   . Seizure disorder (CMS-HCC) 06/12/2017   Insular cortex per neurology, 2019  . Vitamin B12 deficiency     Past Surgical History:  Procedure Laterality Date  . COLONOSCOPY  12/31/2012  . Right total knee arthroplasty using computer-assisted navigation  06/10/2014   Dr.Hooten  . Left total knee arthroplasty using computer assisted navigation   09/02/2014   Dr Mardee  . Coronary artery bypass surgery  03/2015  . OTHER SURGERY  07/27/2020   Surgery for kidney stone  . COLONOSCOPY  02/17/2021   Diverticulosis/PHx CP/FHx CP/Repeat 91yrs/SMR  . APPENDECTOMY    . BILATERAL BREAST SURGERY    . LITHOTRIPSY     07/21/2020 AND 07/27/2020  . TONSILLECTOMY    . WISDOM TEETH      Vitals:   11/29/21 0943  BP: 120/70  Pulse: 76  Temp: 37.3 C (99.1 F)  SpO2: 95%  Weight: 95.7 kg (211 lb)  Height: 167.6 cm (5' 5.98)  PainSc:   3   Body mass index is 34.08 kg/m.  Exam BP 120/70   Pulse 76   Temp 37.3 C (99.1 F)   Ht 167.6 cm (5' 5.98)   Wt 95.7 kg (211 lb)   LMP  (LMP Unknown)   SpO2 95%   BMI 34.08 kg/m   General. Well appearing; NAD; VS reviewed     Eyes. Sclera and conjunctiva clear; Vision grossly intact; extraocular movements intact Ears: TMs are with some serous fluid bilaterally.  Right is greater than left.  No cerumen impaction and ear canals are without erythema or drainage bilaterally.  The right ear does appear to have some mild erythema on the TM Oropharynx. No suspicious lesions Neck. Supple. No swelling, masses, thyroid  normal size, no masses  palpated.   Lungs. Respirations unlabored; clear to auscultation bilaterally Cardiovascular. Heart regular rate and rhythm without murmurs, gallops, or rubs Extremities: without edema and with 2+ pulses bilaterally Skin. Normal color and turgor Neurologic. Alert and oriented x3  Assessment and Plan  Otalgia, bilateral Acute; looks like eustachian tube dysfunction but right ear has the start of what appears to be bacterial infection.  Start Z-Pak and Flonase.  Encouraged her to get Zyrtec 10 mg once daily as well.  Eustachian tube dysfunction, bilateral Acute; assessment plan as above.  COPD Chronic; has had a little bit of cough which could be at her baseline but given the cough Z-Pak for the ear should cover for any COPD exacerbation.   Goals Addressed   None     F/U: Patient to follow-up as needed.  JASON HESTLE WHITAKER, PA  Note: This dictation was prepared with Dragon dictation along with smaller phrase technology. Any transcriptional errors that result from this process are unintentional.

## 2021-12-26 ENCOUNTER — Ambulatory Visit
Admission: RE | Admit: 2021-12-26 | Discharge: 2021-12-26 | Disposition: A | Discharge status: Home / Self Care | Payer: Medicare HMO | Source: Ambulatory Visit | Attending: Internal Medicine | Admitting: Internal Medicine

## 2021-12-26 DIAGNOSIS — Z853 Personal history of malignant neoplasm of breast: Secondary | ICD-10-CM | POA: Diagnosis present

## 2022-02-05 ENCOUNTER — Other Ambulatory Visit: Payer: Self-pay | Admitting: Urology

## 2022-03-15 NOTE — Progress Notes (Signed)
 Patient Profile:   Tonya Walton  is a 72 y.o.  female Chief Complaint  Patient presents with  . Rash    Notes below right eye. Initially just red and painful yesterday. Notes rash today.   Was prescribed Prednisone at [facility] yesterday. Advised it may be shingles.      PROBLEM LIST: Past Medical History:  Diagnosis Date  . Anxiety   . Atrial fibrillation (CMS-HCC)   . Breast cancer (CMS-HCC)   . Chronic kidney disease   . COPD (chronic obstructive pulmonary disease) (CMS-HCC)   . Depression   . GERD (gastroesophageal reflux disease)   . Hyperlipidemia   . Hypertension   . Hypothyroidism   . RLS (restless legs syndrome)   . Seizure disorder (CMS-HCC) 06/12/2017   Insular cortex per neurology, 2019  . Vitamin B12 deficiency     Past Surgical History:  Procedure Laterality Date  . COLONOSCOPY  12/31/2012  . Right total knee arthroplasty using computer-assisted navigation  06/10/2014   Dr.Hooten  . Left total knee arthroplasty using computer assisted navigation   09/02/2014   Dr Mardee  . Coronary artery bypass surgery  03/2015  . OTHER SURGERY  07/27/2020   Surgery for kidney stone  . COLONOSCOPY  02/17/2021   Diverticulosis/PHx CP/FHx CP/Repeat 43yrs/SMR  . APPENDECTOMY    . BILATERAL BREAST SURGERY    . LITHOTRIPSY     07/21/2020 AND 07/27/2020  . TONSILLECTOMY    . WISDOM TEETH      ALLERGIES: No Known Allergies  CURRENT MEDICATIONS: Current Outpatient Medications: allopurinoL  (ZYLOPRIM ) 100 MG tablet, Take 1 tablet (100 mg total) by mouth once daily, Taking apixaban  (ELIQUIS ) 5 mg tablet, Take 1 tablet (5 mg total) by mouth every 12 (twelve) hours, Taking atorvastatin  (LIPITOR ) 80 MG tablet, Take 1 tablet (80 mg total) by mouth once daily, Taking carboxymethylcellulose-glycerin  (REFRESH OPTIVE) 0.5-0.9 % ophthalmic solution, Apply 1 drop to eye 2 (two) times daily, Taking cholecalciferol (VITAMIN D3) 2,000 unit  capsule, Take 2,000 Units by mouth once daily., Taking clonazePAM  (KLONOPIN ) 1 MG tablet, Take 1 tablet (1 mg total) by mouth at bedtime as needed for Anxiety, PRN Not Currently Taking cyanocobalamin  (VITAMIN B12) 1000 MCG tablet, Take 1,000 mcg by mouth once daily, Taking dilTIAZem  (CARDIZEM  CD) 180 MG CD capsule, Take 1 capsule (180 mg total) by mouth once daily, Taking donepeziL  (ARICEPT ) 10 MG tablet, Take 1 tablet (10 mg total) by mouth at bedtime, Taking estradioL  (ESTRACE ) 0.01 % (0.1 mg/gram) vaginal cream, , Taking FLUoxetine  (PROZAC ) 40 MG capsule, Take 1 capsule (40 mg total) by mouth once daily, Taking fluticasone propionate (FLONASE) 50 mcg/actuation nasal spray, Place 2 sprays into both nostrils once daily, Taking hydrOXYchloroQUINE  (PLAQUENIL ) 200 mg tablet, Take 1 tablet (200 mg total) by mouth 2 (two) times daily, Taking lamoTRIgine  (LAMICTAL ) 25 MG tablet, Take 3 tablets (75 mg total) by mouth 2 (two) times daily for 360 days, Taking levocetirizine (XYZAL) 5 MG tablet, 2.5 mg, Taking levothyroxine  (SYNTHROID ) 25 MCG tablet, Take 1 tablet (25 mcg total) by mouth once daily Take on an empty stomach with a glass of water  at least 30-60 minutes before breakfast., Taking meloxicam (MOBIC) 7.5 MG tablet, TAKE 1 TABLET BY MOUTH EVERY DAY WITH BREAKFAST, Taking montelukast  (SINGULAIR ) 10 mg tablet, Take 1 tablet (10 mg total) by mouth once daily, Taking  nitroGLYcerin  (NITROSTAT ) 0.4 MG SL tablet, Place 1 tablet (0.4 mg total) under the tongue every 5 (five) minutes as needed for Chest pain May take up to 3 doses., PRN Not Currently Taking oxyBUTYnin  (DITROPAN ) 5 mg tablet, 1 BY MOUTH 3 TIMES A DAY FOR OVERACTIVE BLADDER, Taking pantoprazole  (PROTONIX ) 40 MG DR tablet, Take 1 tablet by mouth once daily, Taking pramipexole  (MIRAPEX ) 0.25 MG tablet, TAKE 5 TABS BY MOUTH AS DIRECTED TAKE 2 EVERY AM AND 1 AT NOON AND 2 AT BEDTIME, Taking predniSONE (DELTASONE) 20 MG tablet, Take 1 tablet (20  mg total) by mouth 2 (two) times daily for 5 days, Taking triamcinolone  0.1 % cream, Apply topically 2 (two) times daily, Taking zolpidem  (AMBIEN ) 5 MG tablet, TAKE 1 TABLET BY MOUTH EVERYDAY AT BEDTIME, Taking valACYclovir (VALTREX) 1000 MG tablet, Take 1 tablet (1,000 mg total) by mouth 3 (three) times daily    HPI   CLINICAL SUMMARY:  Patient with a right facial rash.  Is been burning for a few days and then the rash came on.  No right eye irritation at all, no change in vision.  Post zoster vaccines  ROS: Review of systems is unremarkable for any active cardiac, respiratory, GI, GU, hematologic, neurologic, dermatologic, HEENT, or psychiatric symptoms except as noted above, 10 systems reviewed.  No fevers, chills, or constitutional symptoms.   PHYSICAL EXAM  Vital signs:  BP (!) 140/60   Pulse 74   Wt 99.8 kg (220 lb)   LMP  (LMP Unknown)   SpO2 97%   BMI 36.61 kg/m  Body mass index is 36.61 kg/m.   Wt Readings from Last 3 Encounters:  03/15/22 99.8 kg (220 lb)  03/14/22 98.4 kg (217 lb)  01/10/22 97.2 kg (214 lb 3.2 oz)     BP Readings from Last 3 Encounters:  03/15/22 (!) 140/60  03/14/22 133/66  01/10/22 132/82    Constitutional:NAD HEENT-erythematous rash under the right eye, no involvement of the eye Neck: supple, no thyromegaly, good ROM Respiratory:clear to auscultation, no rales or wheezes Cardiovascular:RRR, no murmur or gallop Abdominal:soft, good BS, NT Ext: no edema, good peripheral pulses Neuro: alert and oriented X 3, grossly nonfocal     ASSESSMENT/PLAN   Right facial zoster-talked with her at length about any eye irritation she will call ophthalmology immediately or call us .  Already on prednisone, finished that, Kenalog  80 mg IM x 1, Valtrex 1000 mg 3 times daily I do think the zoster vaccine has been very helpful to her Seizure disorder-stable  Dispo:   No follow-ups on file.

## 2022-04-14 ENCOUNTER — Ambulatory Visit: Payer: Medicare HMO | Admitting: Urology

## 2022-07-06 ENCOUNTER — Ambulatory Visit: Payer: Medicare HMO | Admitting: Urology

## 2022-07-13 ENCOUNTER — Other Ambulatory Visit: Payer: Self-pay | Admitting: *Deleted

## 2022-07-13 DIAGNOSIS — N200 Calculus of kidney: Secondary | ICD-10-CM

## 2022-07-14 ENCOUNTER — Ambulatory Visit
Admission: RE | Admit: 2022-07-14 | Discharge: 2022-07-14 | Disposition: A | Discharge status: Home / Self Care | Payer: Medicare HMO | Source: Ambulatory Visit | Attending: Urology | Admitting: Urology

## 2022-07-14 ENCOUNTER — Encounter: Payer: Self-pay | Admitting: Urology

## 2022-07-14 ENCOUNTER — Ambulatory Visit
Admission: RE | Admit: 2022-07-14 | Discharge: 2022-07-14 | Disposition: A | Discharge status: Home / Self Care | Payer: Medicare HMO | Attending: Urology | Admitting: Urology

## 2022-07-14 ENCOUNTER — Ambulatory Visit: Payer: Medicare HMO | Admitting: Urology

## 2022-07-14 VITALS — BP 154/80 | HR 78 | Ht 66.0 in | Wt 232.0 lb

## 2022-07-14 DIAGNOSIS — N200 Calculus of kidney: Secondary | ICD-10-CM | POA: Insufficient documentation

## 2022-07-14 DIAGNOSIS — Z09 Encounter for follow-up examination after completed treatment for conditions other than malignant neoplasm: Secondary | ICD-10-CM

## 2022-07-14 DIAGNOSIS — Z87442 Personal history of urinary calculi: Secondary | ICD-10-CM

## 2022-07-14 DIAGNOSIS — N390 Urinary tract infection, site not specified: Secondary | ICD-10-CM

## 2022-07-14 DIAGNOSIS — Z87440 Personal history of urinary (tract) infections: Secondary | ICD-10-CM | POA: Diagnosis not present

## 2022-07-14 NOTE — Progress Notes (Signed)
I, Tonya Walton,acting as a scribe for Riki Altes, MD.,have documented all relevant documentation on the behalf of Riki Altes, MD,as directed by  Riki Altes, MD while in the presence of Riki Altes, MD.  07/14/2022 1:51 PM   Tonya Walton 1950-07-29 [id number]  Referring provider: Danella Penton, MD [phone] [facility] [address] [facility] [place],  [place] [address]  Chief Complaint  Patient presents with   Nephrolithiasis   Urologic history: 1.  History urinary calculi Ureteroscopic removal 7 mm left renal calculus 07/27/2020 Stone analysis CaOxMono 100% No other calculi on CT  HPI: Tonya Walton is a 72 y.o. female presents for annual follow-up.  Since last year's visit, she has had 1 UTI.  Denies flank, abdominal, or pelvic pain.  No gross hematuria.   PMH: Past Medical History:  Diagnosis Date   Anxiety    Arthritis    Atrial fibrillation (HCC)    CHF (congestive heart failure) (HCC)    Chronic kidney disease    COPD (chronic obstructive pulmonary disease) (HCC)    Coronary artery disease    Depression    Family history of adverse reaction to anesthesia    brothers had a lung collaspe during an unknown procedure.   GERD (gastroesophageal reflux disease)    Headache    History of kidney stones    Hx of CABG 04/01/2015   Single vessel; LIMA-LAD   Hyperlipidemia    Hypertension    Hypothyroidism    Insomnia    Invasive ductal carcinoma of breast, female, right (HCC) 09/27/2010   Stage 1A; ER/PR (+) and HER2/neu indeterminant; pT1b pN0 (sn)   Neuromuscular disorder (HCC)    nerve damage in legs   Personal history of radiation therapy 2012   BREAST CA   Restless leg syndrome    Seizures (HCC)    Shortness of breath dyspnea    Sjogren's syndrome (HCC)    Stroke (HCC)    Some mild weakness in left side residual   Vitamin B12 deficiency     Surgical History: Past Surgical History:  Procedure Laterality Date    APPENDECTOMY     BREAST BIOPSY Left 07/2013   NEG   BREAST BIOPSY Right 08/22/2016   benign   BREAST BIOPSY Right 01/07/2019   asymmetry bx with stereo, coil marker,fat necrosis   BREAST BIOPSY Right 01/07/2019   asymm affirm bx,  x marker,  fat necrosis   BREAST BIOPSY Right 03/25/2020   stereo bx, ribbon clip, neg fat nec   BREAST EXCISIONAL BIOPSY Right 2012   POS   BREAST LUMPECTOMY  2012   Right breast   CARDIAC CATHETERIZATION N/A 03/02/2015   Procedure: Left Heart Cath and Coronary Angiography;  Surgeon: Dalia Heading, MD;  Location: [facility] INVASIVE CV LAB;  Service: Cardiovascular;  Laterality: N/A;   CATARACT EXTRACTION W/PHACO Right 10/14/2014   Procedure: CATARACT EXTRACTION PHACO AND INTRAOCULAR LENS PLACEMENT (IOC);  Surgeon: Lockie Mola, MD;  Location: [facility] SURGERY [facility]TR;  Service: Ophthalmology;  Laterality: Right;   CATARACT EXTRACTION W/PHACO Left 10/05/2021   Procedure: CATARACT EXTRACTION PHACO AND INTRAOCULAR LENS PLACEMENT (IOC) LEFT 7.38 00:52.6;  Surgeon: Lockie Mola, MD;  Location: [facility]ERY [facility];  Service: Ophthalmology;  Laterality: Left;   COLONOSCOPY     COLONOSCOPY WITH PROPOFOL N/A 02/17/2021   Procedure: COLONOSCOPY WITH PROPOFOL;  Surgeon: Jaynie Collins, DO;  Location: [facility] ENDOSCOPY;  Service: Gastroenterology;  Laterality: N/A;   CORONARY  ARTERY BYPASS GRAFT N/A 04/01/2015   Procedure: CORONARY ARTERY BYPASS GRAFTING (CABG) x one , using left mammary artery;  Surgeon: Kerin Perna, MD;  Location: [facility] OR;  Service: Open Heart Surgery;  Laterality: N/A;   CYSTOSCOPY/URETEROSCOPY/HOLMIUM LASER/STENT PLACEMENT Left 07/06/2020   Procedure: CYSTOSCOPY/URETEROSCOPY/STENT PLACEMENT/ retrograde pyelogram;  Surgeon: Riki Altes, MD;  Location: [facility] ORS;  Service: Urology;  Laterality: Left;   CYSTOSCOPY/URETEROSCOPY/HOLMIUM LASER/STENT PLACEMENT Left 07/27/2020   Procedure: CYSTOSCOPY/URETEROSCOPY/HOLMIUM LASER/STENT EXCHANGE;   Surgeon: Riki Altes, MD;  Location: [facility] ORS;  Service: Urology;  Laterality: Left;   JOINT REPLACEMENT Right    Total Knee Replacement   TEE WITHOUT CARDIOVERSION N/A 04/01/2015   Procedure: TRANSESOPHAGEAL ECHOCARDIOGRAM (TEE);  Surgeon: Kerin Perna, MD;  Location: [facility] OR;  Service: Open Heart Surgery;  Laterality: N/A;   TONSILLECTOMY     TOTAL KNEE ARTHROPLASTY Right 06/10/2014   Procedure: TOTAL KNEE ARTHROPLASTY;  Surgeon: Donato Heinz, MD;  Location: [facility] ORS;  Service: Orthopedics;  Laterality: Right;   TOTAL KNEE ARTHROPLASTY Left 09/02/2014   Procedure: TOTAL KNEE ARTHROPLASTY;  Surgeon: Donato Heinz, MD;  Location: [facility] ORS;  Service: Orthopedics;  Laterality: Left;    Home Medications:  Allergies as of 07/14/2022   No Known Allergies      Medication List        Accurate as of July 14, 2022  1:51 PM. If you have any questions, ask your nurse or doctor.          STOP taking these medications    estradiol 0.1 MG/GM vaginal cream Commonly known as: ESTRACE VAGINAL Stopped by: Riki Altes, MD   HYDROcodone-acetaminophen 5-325 MG tablet Commonly known as: NORCO/VICODIN Stopped by: Riki Altes, MD       TAKE these medications    allopurinol 100 MG tablet Commonly known as: ZYLOPRIM Take 100 mg by mouth 2 (two) times daily.   aspirin EC 81 MG tablet Take 81 mg by mouth daily.   atorvastatin 80 MG tablet Commonly known as: LIPITOR Take 80 mg by mouth every morning.   B-1 500 MG Tabs Take 500 mg by mouth daily.   carboxymethylcellul-glycerin 0.5-0.9 % ophthalmic solution Commonly known as: REFRESH OPTIVE Apply to eye.   celecoxib 200 MG capsule Commonly known as: CELEBREX Take 200 mg by mouth 2 (two) times daily.   clonazePAM 1 MG tablet Commonly known as: KLONOPIN every evening.   colchicine 0.6 MG tablet Take 0.6 mg by mouth 2 (two) times daily as needed (gout).   cyanocobalamin 1000 MCG tablet Commonly known as: VITAMIN  B12 Take 1,000 mcg by mouth daily.   cyclobenzaprine 10 MG tablet Commonly known as: FLEXERIL Take 10 mg by mouth daily as needed.   donepezil 5 MG tablet Commonly known as: ARICEPT Take 5 mg by mouth at bedtime.   FLUoxetine 40 MG capsule Commonly known as: PROZAC Take 40 mg by mouth daily.   hydroxychloroquine 200 MG tablet Commonly known as: PLAQUENIL Take 200 mg by mouth 2 (two) times daily.   lamoTRIgine 25 MG tablet Commonly known as: LAMICTAL TAKE 2 TABLETS BY MOUTH EVERY DAY What changed:  how much to take when to take this   levofloxacin 500 MG tablet Commonly known as: LEVAQUIN Take 500 mg by mouth daily.   levothyroxine 25 MCG tablet Commonly known as: SYNTHROID Take 25 mcg by mouth daily before breakfast.   meloxicam 15 MG tablet Commonly known as: MOBIC Take 15 mg by mouth daily.  mirabegron ER 25 MG Tb24 tablet Commonly known as: MYRBETRIQ Take 1 tablet (25 mg total) by mouth daily.   montelukast 10 MG tablet Commonly known as: SINGULAIR Take 10 mg by mouth at bedtime.   pantoprazole 40 MG tablet Commonly known as: PROTONIX Take 1 tablet by mouth daily.   sucralfate 1 g tablet Commonly known as: CARAFATE Take 1 g by mouth 4 (four) times daily as needed (heartburn).   triamcinolone cream 0.1 % Commonly known as: KENALOG Apply topically.   trimethoprim 100 MG tablet Commonly known as: TRIMPEX Take 1 tablet (100 mg total) by mouth daily.   VITAMIN B-2 PO Take 400 mg by mouth daily.   Vitamin D3 50 MCG (2000 UT) capsule Take 2,000 Units by mouth daily.   zolpidem 5 MG tablet Commonly known as: AMBIEN Take 5 mg by mouth at bedtime.        Allergies: No Known Allergies  Family History: Family History  Problem Relation Age of Onset   Leukemia Mother    Bone cancer Father    Heart disease Brother    Heart disease Brother    Diabetes Brother    Hypertension Brother    Diabetes Brother    Hypertension Brother    Heart  disease Brother    Breast cancer Paternal Aunt     Social History:  reports that she quit smoking about 30 years ago. Her smoking use included cigarettes. She smoked an average of 1 pack per day. She has never used smokeless tobacco. She reports that she does not drink alcohol and does not use drugs.   Physical Exam: BP (!) 154/80   Pulse 78   Ht 5\' 6"  (1.676 m)   Wt 232 lb (105.2 kg)   BMI 37.45 kg/m   Constitutional:  Alert and oriented, No acute distress. HEENT: [place] AT, moist mucus membranes.  Trachea midline, no masses. Cardiovascular: No clubbing, cyanosis, or edema. Respiratory: Normal respiratory effort, no increased work of breathing. GI: Abdomen is soft, nontender, nondistended, no abdominal masses Skin: No rashes, bruises or suspicious lesions. Neurologic: Grossly intact, no focal deficits, moving all 4 extremities. Psychiatric: Normal mood and affect.   Pertinent Imaging: KUB performed today was personal review and interpreted. There is a moderate amount of stool and bowel gas overlying the renal outlines. Pelvic fibrosis are present. There are faint calcifications overlying the right renal outline, which could potentially represent small, non-obstructing renal calculi.    Assessment & Plan:    1. Personal history urinary calculi Possible right renal calculi Continue annual surveillance with KUB  2. Recurrent UTI UTI since March 2023  [facility] [address] [place], [place] [address] [phone]

## 2022-07-19 ENCOUNTER — Telehealth: Payer: Self-pay

## 2022-07-19 NOTE — Telephone Encounter (Signed)
Pt called triage line, states she has been having burning with urination. Pt states she was advised to schedule with Dr.Stoioff if she has had two UTI back to back. Pt was scheduled to see Dr.Stoioff tomorrow and was advised to give urine sample upon arrival

## 2022-07-20 ENCOUNTER — Ambulatory Visit: Payer: Medicare HMO | Admitting: Urology

## 2022-07-20 ENCOUNTER — Encounter: Payer: Self-pay | Admitting: Urology

## 2022-07-20 VITALS — BP 134/58 | HR 81 | Ht 66.0 in | Wt 232.0 lb

## 2022-07-20 DIAGNOSIS — R300 Dysuria: Secondary | ICD-10-CM

## 2022-07-20 DIAGNOSIS — R102 Pelvic and perineal pain: Secondary | ICD-10-CM | POA: Diagnosis not present

## 2022-07-20 DIAGNOSIS — Z87442 Personal history of urinary calculi: Secondary | ICD-10-CM

## 2022-07-20 DIAGNOSIS — R399 Unspecified symptoms and signs involving the genitourinary system: Secondary | ICD-10-CM | POA: Diagnosis not present

## 2022-07-20 LAB — URINALYSIS, COMPLETE
Bilirubin, UA: NEGATIVE
Leukocytes,UA: NEGATIVE
Nitrite, UA: NEGATIVE
RBC, UA: NEGATIVE
Specific Gravity, UA: >1.03 — ABNORMAL HIGH (ref 1.005–1.030)
Urobilinogen, Ur: 1 mg/dL (ref 0.2–1.0)
pH, UA: 5.5 (ref 5.0–7.5)

## 2022-07-20 LAB — MICROSCOPIC EXAMINATION: Epithelial Cells (non renal): >10 /hpf — AB (ref 0–10)

## 2022-07-20 NOTE — Progress Notes (Signed)
Marcelle Overlie Plume,acting as a scribe for Riki Altes, MD.,have documented all relevant documentation on the behalf of Riki Altes, MD,as directed by  Riki Altes, MD while in the presence of Riki Altes, MD.  07/20/2022 4:10 PM   [name] [birth year] [id number]  Referring provider: Danella Penton, MD [phone] [facility] [address] [facility] [place],  [place] [address]  Chief Complaint  Patient presents with   Dysuria    HPI: Tonya Walton is a 72 y.o. female who called for an acute visit for UTI symptoms.   Actually seen 07/14/2022 for annual visit and was asymptomatic Since her last visit, she had relatively acute onset of urinary frequency, urgency, dysuria, hesitancy, and lower abdominal discomfort No fever or chills. She does have mild nausea  PMH: Past Medical History:  Diagnosis Date   Anxiety    Arthritis    Atrial fibrillation (HCC)    CHF (congestive heart failure) (HCC)    Chronic kidney disease    COPD (chronic obstructive pulmonary disease) (HCC)    Coronary artery disease    Depression    Family history of adverse reaction to anesthesia    brothers had a lung collaspe during an unknown procedure.   GERD (gastroesophageal reflux disease)    Headache    History of kidney stones    Hx of CABG 04/01/2015   Single vessel; LIMA-LAD   Hyperlipidemia    Hypertension    Hypothyroidism    Insomnia    Invasive ductal carcinoma of breast, female, right (HCC) 09/27/2010   Stage 1A; ER/PR (+) and HER2/neu indeterminant; pT1b pN0 (sn)   Neuromuscular disorder (HCC)    nerve damage in legs   Personal history of radiation therapy 2012   BREAST CA   Restless leg syndrome    Seizures (HCC)    Shortness of breath dyspnea    Sjogren's syndrome (HCC)    Stroke (HCC)    Some mild weakness in left side residual   Vitamin B12 deficiency     Surgical History: Past Surgical History:  Procedure Laterality Date   APPENDECTOMY      BREAST BIOPSY Left 07/2013   NEG   BREAST BIOPSY Right 08/22/2016   benign   BREAST BIOPSY Right 01/07/2019   asymmetry bx with stereo, coil marker,fat necrosis   BREAST BIOPSY Right 01/07/2019   asymm affirm bx,  x marker,  fat necrosis   BREAST BIOPSY Right 03/25/2020   stereo bx, ribbon clip, neg fat nec   BREAST EXCISIONAL BIOPSY Right 2012   POS   BREAST LUMPECTOMY  2012   Right breast   CARDIAC CATHETERIZATION N/A 03/02/2015   Procedure: Left Heart Cath and Coronary Angiography;  Surgeon: Dalia Heading, MD;  Location: [facility] INVASIVE CV LAB;  Service: Cardiovascular;  Laterality: N/A;   CATARACT EXTRACTION W/PHACO Right 10/14/2014   Procedure: CATARACT EXTRACTION PHACO AND INTRAOCULAR LENS PLACEMENT (IOC);  Surgeon: Lockie Mola, MD;  Location: [facility];  Service: Ophthalmology;  Laterality: Right;   CATARACT EXTRACTION W/PHACO Left 10/05/2021   Procedure: CATARACT EXTRACTION PHACO AND INTRAOCULAR LENS PLACEMENT (IOC) LEFT 7.38 00:52.6;  Surgeon: Lockie Mola, MD;  Location: [facility]ERY [facility];  Service: Ophthalmology;  Laterality: Left;   COLONOSCOPY     COLONOSCOPY WITH PROPOFOL N/A 02/17/2021   Procedure: COLONOSCOPY WITH PROPOFOL;  Surgeon: Jaynie Collins, DO;  Location: [facility] ENDOSCOPY;  Service: Gastroenterology;  Laterality: N/A;   CORONARY ARTERY BYPASS GRAFT  N/A 04/01/2015   Procedure: CORONARY ARTERY BYPASS GRAFTING (CABG) x one , using left mammary artery;  Surgeon: Kerin Perna, MD;  Location: [facility] OR;  Service: Open Heart Surgery;  Laterality: N/A;   CYSTOSCOPY/URETEROSCOPY/HOLMIUM LASER/STENT PLACEMENT Left 07/06/2020   Procedure: CYSTOSCOPY/URETEROSCOPY/STENT PLACEMENT/ retrograde pyelogram;  Surgeon: Riki Altes, MD;  Location: [facility] ORS;  Service: Urology;  Laterality: Left;   CYSTOSCOPY/URETEROSCOPY/HOLMIUM LASER/STENT PLACEMENT Left 07/27/2020   Procedure: CYSTOSCOPY/URETEROSCOPY/HOLMIUM LASER/STENT EXCHANGE;  Surgeon: Riki Altes, MD;  Location: [facility] ORS;  Service: Urology;  Laterality: Left;   JOINT REPLACEMENT Right    Total Knee Replacement   TEE WITHOUT CARDIOVERSION N/A 04/01/2015   Procedure: TRANSESOPHAGEAL ECHOCARDIOGRAM (TEE);  Surgeon: Kerin Perna, MD;  Location: [facility] OR;  Service: Open Heart Surgery;  Laterality: N/A;   TONSILLECTOMY     TOTAL KNEE ARTHROPLASTY Right 06/10/2014   Procedure: TOTAL KNEE ARTHROPLASTY;  Surgeon: Donato Heinz, MD;  Location: [facility] ORS;  Service: Orthopedics;  Laterality: Right;   TOTAL KNEE ARTHROPLASTY Left 09/02/2014   Procedure: TOTAL KNEE ARTHROPLASTY;  Surgeon: Donato Heinz, MD;  Location: [facility] ORS;  Service: Orthopedics;  Laterality: Left;    Home Medications:  Allergies as of 07/20/2022   No Known Allergies      Medication List        Accurate as of July 20, 2022  4:10 PM. If you have any questions, ask your nurse or doctor.          allopurinol 100 MG tablet Commonly known as: ZYLOPRIM Take 100 mg by mouth 2 (two) times daily.   aspirin EC 81 MG tablet Take 81 mg by mouth daily.   atorvastatin 80 MG tablet Commonly known as: LIPITOR Take 80 mg by mouth every morning.   B-1 500 MG Tabs Take 500 mg by mouth daily.   carboxymethylcellul-glycerin 0.5-0.9 % ophthalmic solution Commonly known as: REFRESH OPTIVE Apply to eye.   celecoxib 200 MG capsule Commonly known as: CELEBREX Take 200 mg by mouth 2 (two) times daily.   clonazePAM 1 MG tablet Commonly known as: KLONOPIN every evening.   colchicine 0.6 MG tablet Take 0.6 mg by mouth 2 (two) times daily as needed (gout).   cyanocobalamin 1000 MCG tablet Commonly known as: VITAMIN B12 Take 1,000 mcg by mouth daily.   cyclobenzaprine 10 MG tablet Commonly known as: FLEXERIL Take 10 mg by mouth daily as needed.   donepezil 5 MG tablet Commonly known as: ARICEPT Take 5 mg by mouth at bedtime.   FLUoxetine 40 MG capsule Commonly known as: PROZAC Take 40 mg by mouth daily.    hydroxychloroquine 200 MG tablet Commonly known as: PLAQUENIL Take 200 mg by mouth 2 (two) times daily.   lamoTRIgine 25 MG tablet Commonly known as: LAMICTAL TAKE 2 TABLETS BY MOUTH EVERY DAY What changed:  how much to take when to take this   levofloxacin 500 MG tablet Commonly known as: LEVAQUIN Take 500 mg by mouth daily.   levothyroxine 25 MCG tablet Commonly known as: SYNTHROID Take 25 mcg by mouth daily before breakfast.   meloxicam 15 MG tablet Commonly known as: MOBIC Take 15 mg by mouth daily.   mirabegron ER 25 MG Tb24 tablet Commonly known as: MYRBETRIQ Take 1 tablet (25 mg total) by mouth daily.   montelukast 10 MG tablet Commonly known as: SINGULAIR Take 10 mg by mouth at bedtime.   pantoprazole 40 MG tablet Commonly known as: PROTONIX Take 1 tablet by mouth daily.  sucralfate 1 g tablet Commonly known as: CARAFATE Take 1 g by mouth 4 (four) times daily as needed (heartburn).   triamcinolone cream 0.1 % Commonly known as: KENALOG Apply topically.   trimethoprim 100 MG tablet Commonly known as: TRIMPEX Take 1 tablet (100 mg total) by mouth daily.   VITAMIN B-2 PO Take 400 mg by mouth daily.   Vitamin D3 50 MCG (2000 UT) capsule Take 2,000 Units by mouth daily.   zolpidem 5 MG tablet Commonly known as: AMBIEN Take 5 mg by mouth at bedtime.        Family History: Family History  Problem Relation Age of Onset   Leukemia Mother    Bone cancer Father    Heart disease Brother    Heart disease Brother    Diabetes Brother    Hypertension Brother    Diabetes Brother    Hypertension Brother    Heart disease Brother    Breast cancer Paternal Aunt     Social History:  reports that she quit smoking about 30 years ago. Her smoking use included cigarettes. She smoked an average of 1 pack per day. She has never used smokeless tobacco. She reports that she does not drink alcohol and does not use drugs.   Physical Exam: BP (!) 134/58    Pulse 81   Ht 5\' 6"  (1.676 m)   Wt 232 lb (105.2 kg)   BMI 37.45 kg/m   Constitutional:  Alert and oriented, No acute distress. HEENT: [place] AT Respiratory: Normal respiratory effort, no increased work of breathing. GI: Abdomen is soft, nontender, nondistended, no abdominal masses Psychiatric: Normal mood and affect.  Laboratory Data:  Urinalysis Microscopy: 0-5 WBC/ RBC, >10 epithelial cells, calcium oxalate crystals  Assessment & Plan:    Acute onset of storage related voiding symptoms, dysuria, and urinalysis not consistent with the UTI Discuss the possibility of a recurrent distal ureteral calculus Urine culture ordered Scheduled renal stone protocol CT; we will call her with the results Samples of gemtesa 75 mg daily given Rx tamsulosin 0.4 mg sent to pharmacy  I have reviewed the above documentation for accuracy and completeness, and I agree with the above.   Riki Altes, MD  [facility] [address] [place], [place] [address] [phone]

## 2022-07-23 LAB — CULTURE, URINE COMPREHENSIVE

## 2022-07-25 LAB — CULTURE, URINE COMPREHENSIVE

## 2022-08-01 NOTE — Progress Notes (Signed)
 Medicare Wellness Visit   Providers Rendering Care Dr. Oneil [facility]  Functional Assessment (1) Hearing: Demonstrates no difficulty in hearing during normal conversation (2) Risk of Falls: Patient denies any falls or near falls in the last year (3) Home Safety: Patient feels secure in their home. There are operational smoke alarms in multiple areas of the home. (4) Activities of Daily Living: Independently manages personal grooming and household chores, including cooking, cleaning and laundry.  Manages Personal finances without assistance.    Depression Screening PHQ 2/9 last 3 flowsheet values     03/25/2021   11:20 AM 09/21/2021   11:10 AM 08/01/2022    1:38 PM  PHQ-2/9 Depression Screening   Little interest or pleasure in doing things   0  Feeling down, depressed, or hopeless   1  Patient Health Questionnaire-2 Score   1  How difficult have these problems made it for you to do your work, take care of things at home, or get along with other people?   Not difficult at all  (OBSOLETE) Little interest or pleasure in doing things 2 0   (OBSOLETE) Feeling down, depressed, or hopeless (or irritable for Teens only)? 2 0   (OBSOLETE) Total Prescreening Score 4 0   (OBSOLETE) Trouble falling or staying asleep, or sleeping too much? 0    (OBSOLETE) Feeling tired or having little energy? 2    (OBSOLETE) Poor appetite or overeating? 0    (OBSOLETE) Feeling bad about yourself - or that you are a failure or have let yourself or your family down? 0    (OBSOLETE) Trouble concentrating on things, such as reading the newspaper or watching television? 0    (OBSOLETE) Moving or speaking so slowly that other people could have noticed?  Or the opposite - being so fidgety or restless that you have been moving around a lot more than usual? 0    (OBSOLETE) Thoughts that you would be better off dead, or of hurting yourself in some way? 0    (OBSOLETE) How difficult have these problems made it  for you do your work, take care of things at home, or get along with people? Somewhat difficult    (OBSOLETE) Total Score = 6 0      Depression Severity and Treatment Recommendations:  0-4= None  5-9= Mild / Treatment: Support, educate to call if worse; return in one month  10-14= Moderate / Treatment: Support, watchful waiting; Antidepressant or Psychotherapy  15-19= Moderately severe / Treatment: Antidepressant OR Psychotherapy  >= 20 = Major depression, severe / Antidepressant AND Psychotherapy   Cognitive impairment Oriented to person, place and time.  Responses appear appropriate and timely to this observer.   Prevention Plan  Item name                              Frequency        Month Due       Year Due Health Maintenance  Topic Date Due  . Hepatitis C Screen  Never done  . Shingrix (1 of 2) Never done  . Pneumococcal Vaccine: 65+ (3 of 3 - PPSV23 or PCV20) 02/16/2018  . DXA Bone Density Scan  10/21/2019  . COVID-19 Vaccine (4 - 2023-24 season) 09/30/2021  . Influenza Vaccine (1) 10/01/2022  . Mammogram  12/27/2022  . Creatinine Level  04/05/2023  . Potassium Level  04/05/2023  . Lipid Panel  04/05/2023  . Depression Screening  08/01/2023  . Medicare Initial or AWV  08/01/2023  . Adult Tetanus (Td And Tdap)  11/07/2023  . Diabetes Screening  04/04/2025  . Colorectal Cancer Screening  02/17/2026  . Hib Vaccines  Aged Out  . Hepatitis A Vaccines  Aged Out  . Meningococcal ACWY Vaccine  Aged Out  . HPV Vaccines  Aged Out    Other personalized health advice Encouraged patient to exercise regularly.  Encouraged attention to diet with good intake of fruits, vegetables, and limitation of red meat to 2 times a week or less    End of Life Counseling Patient has a living will in place.  Patient is a full code.

## 2022-08-01 NOTE — Progress Notes (Signed)
 ANNUAL EXAM  Tonya Walton is a 72 y.o. female  CHIEF COMPLAINT: Chief Complaint  Patient presents with  . Annual Exam    SUBJECTIVE:  Patient fairly limited in her activity because of her husband and his decline.  Her memory is dramatically improved.  Joints are stable. ______________________________________________________________________ A comprehensive ROS was negative in all 10 systems reviewed.  ALLERGIES: Patient has no known allergies.  Past Medical History:  Diagnosis Date  . Anxiety   . Atrial fibrillation (CMS/HHS-HCC)   . Breast cancer (CMS/HHS-HCC)   . Chronic kidney disease   . COPD (chronic obstructive pulmonary disease) (CMS/HHS-HCC)   . Depression   . GERD (gastroesophageal reflux disease)   . Hyperlipidemia   . Hypertension   . Hypothyroidism   . RLS (restless legs syndrome)   . Seizure disorder (CMS/HHS-HCC) 06/12/2017   Insular cortex per neurology, 2019  . Vitamin B12 deficiency     Past Surgical History:  Procedure Laterality Date  . COLONOSCOPY  12/31/2012  . Right total knee arthroplasty using computer-assisted navigation  06/10/2014   Dr.Hooten  . Left total knee arthroplasty using computer assisted navigation   09/02/2014   Dr Mardee  . Coronary artery bypass surgery  03/2015  . OTHER SURGERY  07/27/2020   Surgery for kidney stone  . COLONOSCOPY  02/17/2021   Diverticulosis/PHx CP/FHx CP/Repeat 36yrs/SMR  . APPENDECTOMY    . BILATERAL BREAST SURGERY    . LITHOTRIPSY     07/21/2020 AND 07/27/2020  . TONSILLECTOMY    . WISDOM TEETH      Current Outpatient Medications  Medication Sig Dispense Refill  . allopurinoL  (ZYLOPRIM ) 100 MG tablet Take 1 tablet (100 mg total) by mouth once daily 90 tablet 3  . apixaban  (ELIQUIS ) 5 mg tablet Take 1 tablet (5 mg total) by mouth every 12 (twelve) hours 60 tablet 11  . atorvastatin  (LIPITOR ) 80 MG tablet Take 1 tablet (80 mg total) by mouth once daily 90 tablet 3  . carboxymethylcellulose-glycerin   (REFRESH OPTIVE) 0.5-0.9 % ophthalmic solution Apply 1 drop to eye 2 (two) times daily    . cholecalciferol (VITAMIN D3) 2,000 unit capsule Take 2,000 Units by mouth once daily.    . clonazePAM  (KLONOPIN ) 1 MG tablet TAKE 1 TABLET BY MOUTH AT BEDTIME AS NEEDED FOR ANXIETY. 30 tablet 5  . cyanocobalamin  (VITAMIN B12) 1000 MCG tablet Take 1,000 mcg by mouth once daily    . dilTIAZem  (CARDIZEM  CD) 180 MG CD capsule Take 1 capsule (180 mg total) by mouth once daily 90 capsule 3  . donepeziL  (ARICEPT ) 10 MG tablet Take 1 tablet (10 mg total) by mouth at bedtime 90 tablet 3  . estradioL  (ESTRACE ) 0.01 % (0.1 mg/gram) vaginal cream     . FLUoxetine  (PROZAC ) 40 MG capsule Take 1 capsule (40 mg total) by mouth once daily 90 capsule 3  . fluticasone propionate (FLONASE) 50 mcg/actuation nasal spray Place 2 sprays into both nostrils once daily 16 g 11  . hydroxychloroquine  (PLAQUENIL ) 200 mg tablet Take 1 tablet (200 mg total) by mouth 2 (two) times daily 180 tablet 1  . lamoTRIgine  (LAMICTAL ) 25 MG tablet Take 3 tablets (75 mg total) by mouth 2 (two) times daily for 360 days 540 tablet 3  . levocetirizine (XYZAL) 5 MG tablet 2.5 mg    . levothyroxine  (SYNTHROID ) 25 MCG tablet Take 1 tablet (25 mcg total) by mouth once daily Take on an empty stomach with a glass of water  at least 30-60 minutes before  breakfast. 90 tablet 3  . meloxicam (MOBIC) 7.5 MG tablet Take 1 tablet (7.5 mg total) by mouth daily with breakfast 90 tablet 3  . montelukast  (SINGULAIR ) 10 mg tablet Take 1 tablet (10 mg total) by mouth once daily 90 tablet 3  . nitroGLYcerin  (NITROSTAT ) 0.4 MG SL tablet Place 1 tablet (0.4 mg total) under the tongue every 5 (five) minutes as needed for Chest pain May take up to 3 doses. 25 tablet 11  . oxyBUTYnin  (DITROPAN ) 5 mg tablet Take 1 tablet (5 mg total) by mouth 3 (three) times daily 90 tablet 11  . triamcinolone  0.1 % cream Apply topically 2 (two) times daily 30 g 3  . valACYclovir (VALTREX) 1000 MG  tablet Take 1 tablet (1,000 mg total) by mouth 3 (three) times daily 21 tablet 0  . zolpidem  (AMBIEN ) 5 MG tablet TAKE 1 TABLET BY MOUTH EVERYDAY AT BEDTIME 90 tablet 1   No current facility-administered medications for this visit.    PHYSICAL EXAM: BP (!) 140/78   Pulse 87   Ht 165.1 cm (5' 5)   Wt 99.3 kg (219 lb)   LMP  (LMP Unknown)   SpO2 95%   BMI 36.44 kg/m  Body mass index is 36.44 kg/m.  Wt Readings from Last 3 Encounters:  08/01/22 99.3 kg (219 lb)  04/17/22 99.8 kg (220 lb)  03/15/22 99.8 kg (220 lb)     BP Readings from Last 3 Encounters:  08/01/22 (!) 140/78  04/17/22 (!) 142/82  03/15/22 (!) 140/60    General: Alert oriented x3  Skin: No suspicious lesions or moles.   Eyes: Sclera and conjunctiva clear; pupils equal round and reactive to light and accommodation; extraocular movements intact Ears: External ears and canal normal; tympanic membranes normal.   Nose: Mucosa healthy without drainage or ulceration Oropharynx: No suspicious lesions Neck: No swelling, masses, stiffness, pain, limited movement, carotid pulses normal bilaterally, thyroid  normal size, no masses palpated. No bruits heard. Lungs: Respirations unlabored; clear to auscultation bilaterally Back: No spinal deformity Cardiovascular: Heart regular rate and rhythm without murmurs, gallops, or rubs Abdomen: Soft; non tender; non distended;  no masses or organomegaly Lymph Nodes: No significant cervical, supraclavicular, or axillary lymphadenopathy noted Musculoskeletal: No active joint inflammation Extremities: Normal, no edema Pulses: Dorsalis pedis palpable and symmetric bilaterally Neurologic: Alert and oriented times 3; speech intact; face symmetrical; moves all extremities well    ASSESSMENT/PLAN  Inflammatory disease-with Sjogren's, overall doing well Alzheimer's dementia-dramatically better on 10 mg of Aricept  Mild depression-stable on medication COPD-controlled Breast  cancer-follows up closely A-fib-currently on Eliquis , normal sinus rhythm 4-month follow-up, really challenged her to be active and get out and do things   Goals Addressed             This Visit's Progress   . Lose Weight   On track    Patient states that she would like to commit to losing 10 pounds in 3 months with her husband. Patient states that she plans on signing up for a yoga class and start walking to accomplish this goal.    . Lose Weight      . Weight (lb) < 200   On track   . Weight loss of 10 lb every 3 months.   On track       .phq2  No follow-ups on file.

## 2022-08-02 ENCOUNTER — Ambulatory Visit
Admission: RE | Admit: 2022-08-02 | Discharge: 2022-08-02 | Disposition: A | Discharge status: Home / Self Care | Payer: Medicare HMO | Source: Ambulatory Visit | Attending: Urology | Admitting: Urology

## 2022-08-02 DIAGNOSIS — K5730 Diverticulosis of large intestine without perforation or abscess without bleeding: Secondary | ICD-10-CM | POA: Insufficient documentation

## 2022-08-02 DIAGNOSIS — K449 Diaphragmatic hernia without obstruction or gangrene: Secondary | ICD-10-CM | POA: Insufficient documentation

## 2022-08-02 DIAGNOSIS — N200 Calculus of kidney: Secondary | ICD-10-CM | POA: Diagnosis not present

## 2022-08-02 DIAGNOSIS — R399 Unspecified symptoms and signs involving the genitourinary system: Secondary | ICD-10-CM | POA: Insufficient documentation

## 2022-08-02 DIAGNOSIS — I700 Atherosclerosis of aorta: Secondary | ICD-10-CM | POA: Insufficient documentation

## 2022-08-08 ENCOUNTER — Telehealth: Payer: Self-pay | Admitting: Family Medicine

## 2022-08-08 NOTE — Telephone Encounter (Signed)
Patient notified of results. She is not having urinary symptoms at this time.

## 2023-03-07 NOTE — Progress Notes (Signed)
 Rheumatology Follow Up Note  Chief Complaint  Patient presents with  . Sjogren's syndrome without extraglandular involvement      Subjective:HPI  Tonya Walton is a 73 y.o. female is here today for follow up of Sjogren's and gout . The patient's allergies, current medications, past family history, past medical history, past social history, past surgical history and problem list were reviewed and updated as appropriate.   She is taking Plaquenil . She is tolerating this well. She is taking the allopurinol , tolerating this well. She had two minor gout flare since last evaluation. She takes colchicine as needed. The dry eyes and dry mouth are stable. She is using rewetting drops. She is followed by ophthalmology. She has no joint pains or joints swelling or skin or dyspnea or parotid swelling or raynaud's.   Review of Systems:   Review of Systems  Constitutional:  Positive for fatigue.  HENT:  Negative for mouth sores and trouble swallowing.        Dry Mouth  Eyes:  Negative for redness.       Dry Eyes  Respiratory:  Negative for cough, shortness of breath and wheezing.   Cardiovascular:  Positive for leg swelling. Negative for chest pain.  Gastrointestinal:  Negative for constipation, diarrhea and nausea.  Endocrine: Negative for cold intolerance and heat intolerance.  Genitourinary:  Negative for hematuria.  Musculoskeletal:        Per HPI  Skin:  Negative for color change and rash.  Neurological:  Positive for dizziness, weakness and headaches. Negative for numbness.       Memory loss  Hematological:  Does not bruise/bleed easily.  Psychiatric/Behavioral:  Positive for sleep disturbance. Negative for dysphoric mood. The patient is not nervous/anxious.   All other systems reviewed and are negative.    Objective:  Vitals:   03/07/23 0944  BP: 138/70  Temp: 36.5 C (97.7 F)  TempSrc: Temporal  Weight: 100.2 kg (221 lb)  Height: 165.1 cm (5' 5)  PainSc:   2    Length of  Stiffness: 45 minutes   GEN - Pleasant, No Apparent Distress  HEENT - normocephalic and atraumatic. Conjunctiva Clear. Dry Mouth Neck - supple with no adenopathy or thyromegaly.   C spine with full range of motion. Heart - regular rate and rhythm, No murmurs/gallops/rub, Nml S1S2 Lungs - clear to auscultation in all fields. Extremities - there is no cyanosis or edema. Neurological - alert and oriented.  Spine - mild paraspinal tenderness; Cervical/Thoracic/Lumbar spine tenderness Skin - areas of actinic kertaosis MSK - The following joints were examined bilaterally: Hands, Wrists, Elbows, Shoulders, Metatarsals, Ankels, Knees and Hips; they were normal apart from what is noted.    100% Fist Formation Right CMC Squaring  Mild DIP and PIP Enlargement No Synovitis or Dactylitis No Tender Point   ______________________________________________________________________ Labs/Imaging Reviewed in EMR Cr 0.9, AST 14, ALT 07 Normal CBC Uric acid 5.5 Vitamin B12 669 TSH 2.6 ESR 40 C4 64 (H); C3 192; Anti-DNA (Ds) < 1  Pos: SSA (1.2) Neg: SSB  MRI C Spine (2016) 1. Multilevel degenerative disc and facet disease, as above.  2. Best explanation for a right radiculopathy is advanced right foraminal stenosis at C5-6.  3. Advanced left foraminal stenosis present at C3-4 and C5-6.   Assessment and Plan   1. Sjogren's Syndrome: Stable -- Dry eyes syndrome, positive SSA -- Continue with rewetting drop -- Continue with Plaquenil  200 mg twice daily -- Followed by Ophthalmology   2. Lumbar degenerative  disc disease/Cervical spondylosis -- Followed by Physiatry  -- Can take Tylenol  or Meloxicam for pains     3. Gout: Stable -- Continue Allopurinol  100 mg daily  -- Can take Colchicine as needed for flares   4. Long term use of high risk medication -- Allopurinol  requires renal and hepatic monitoring -- Hydroxychloroquine  (Plaquenil ) is an immunosuppressive medication that require  monitoring for eye toxicity. Dr.Nice -- Patient has been educated on the importance of keeping follow up appointments for disease and medication monitoring.  -- Reviewed Labs   Diagnoses and all orders for this visit:  Sjogren's syndrome without extraglandular involvement (CMS/HHS-HCC)  Idiopathic chronic gout of foot without tophus, unspecified laterality  Encounter for long-term (current) use of high-risk medication   Return in about 6 months (around 09/04/2023) for Routine Follow Up.   All new prescription medications, changes in current prescription dosages, and sample medications were discussed with the patient, including patient education, medication name, use, dosage, potential side effects, drug interactions, consequences of not using/taking, and special instructions.  Patient expressed understanding.  No barriers to adherence.   I appreciate the opportunity to participate in the care of Tonya Walton. Please do not hesitate to contact me with any questions or concerns that may arise in regards to the patient's rheumatologic disease.   I personally performed the service. (TP)  MAYUR LOREE BLANCH, MD

## 2023-03-15 ENCOUNTER — Other Ambulatory Visit: Payer: Self-pay | Admitting: Internal Medicine

## 2023-03-15 DIAGNOSIS — Z1231 Encounter for screening mammogram for malignant neoplasm of breast: Secondary | ICD-10-CM

## 2023-03-20 NOTE — Progress Notes (Signed)
 Patient Profile:   Tonya Walton  is a 73 y.o.  female Chief Complaint  Patient presents with  . Cough  . Wheezing  . Sore Throat  . Headache    All symptoms since Friday. Treating with Alka seltzer and Tylenol .      PROBLEM LIST: Past Medical History:  Diagnosis Date  . Anxiety   . Atrial fibrillation (CMS/HHS-HCC)   . Breast cancer (CMS/HHS-HCC)   . Chronic kidney disease   . COPD (chronic obstructive pulmonary disease) (CMS/HHS-HCC)   . Depression   . GERD (gastroesophageal reflux disease)   . Hyperlipidemia   . Hypertension   . Hypothyroidism   . RLS (restless legs syndrome)   . Seizure disorder (CMS/HHS-HCC) 06/12/2017   Insular cortex per neurology, 2019  . Vitamin B12 deficiency     Past Surgical History:  Procedure Laterality Date  . COLONOSCOPY  12/31/2012  . Right total knee arthroplasty using computer-assisted navigation  06/10/2014   Dr.Hooten  . Left total knee arthroplasty using computer assisted navigation   09/02/2014   Dr Mardee  . Coronary artery bypass surgery  03/2015  . OTHER SURGERY  07/27/2020   Surgery for kidney stone  . COLONOSCOPY  02/17/2021   Diverticulosis/PHx CP/FHx CP/Repeat 79yrs/SMR  . APPENDECTOMY    . BILATERAL BREAST SURGERY    . LITHOTRIPSY     07/21/2020 AND 07/27/2020  . TONSILLECTOMY    . WISDOM TEETH      ALLERGIES: No Known Allergies  CURRENT MEDICATIONS: Current Outpatient Medications  Medication Sig Dispense Refill  . acetaminophen  (TYLENOL ) 325 MG tablet Take 650 mg by mouth every 4 (four) hours as needed for Pain    . albuterol  MDI, PROVENTIL , VENTOLIN , PROAIR , HFA 90 mcg/actuation inhaler Inhale 2 inhalations into the lungs every 4 (four) hours as needed for Wheezing 1 each 0  . allopurinoL  (ZYLOPRIM ) 100 MG tablet Take 1 tablet (100 mg total) by mouth once daily 90 tablet 3  . atorvastatin  (LIPITOR ) 80 MG tablet Take 1 tablet (80 mg total) by mouth once daily 90 tablet 3   . carboxymethylcellulose-glycerin  (REFRESH OPTIVE) 0.5-0.9 % ophthalmic solution Apply 1 drop to eye 2 (two) times daily    . cholecalciferol (VITAMIN D3) 2,000 unit capsule Take 2,000 Units by mouth once daily.    . colchicine (COLCRYS) 0.6 mg tablet Take 1 tablet (0.6 mg total) by mouth 2 (two) times daily as needed (for gout flare) 180 tablet 1  . cyanocobalamin  (VITAMIN B12) 1000 MCG tablet Take 1,000 mcg by mouth once daily    . dilTIAZem  (CARDIZEM  CD) 180 MG CD capsule Take 1 capsule (180 mg total) by mouth once daily 90 capsule 3  . donepeziL  (ARICEPT ) 10 MG tablet Take 1 tablet (10 mg total) by mouth at bedtime 90 tablet 3  . estradioL  (ESTRACE ) 0.01 % (0.1 mg/gram) vaginal cream     . FLUoxetine  (PROZAC ) 40 MG capsule Take 1 capsule (40 mg total) by mouth once daily 90 capsule 3  . hydroxychloroquine  (PLAQUENIL ) 200 mg tablet Take 1 tablet (200 mg total) by mouth 2 (two) times daily 180 tablet 1  . lamoTRIgine  (LAMICTAL ) 25 MG tablet Take 3 tablets (75 mg total) by mouth 2 (two) times daily for 360 days 540 tablet 3  . levocetirizine (XYZAL) 5 MG tablet 2.5 mg    . levothyroxine  (  SYNTHROID ) 25 MCG tablet Take 1 tablet (25 mcg total) by mouth once daily Take on an empty stomach with a glass of water  at least 30-60 minutes before breakfast. 90 tablet 3  . meloxicam (MOBIC) 7.5 MG tablet Take 1 tablet (7.5 mg total) by mouth daily with breakfast 90 tablet 3  . montelukast  (SINGULAIR ) 10 mg tablet Take 1 tablet (10 mg total) by mouth once daily 90 tablet 3  . nitroGLYcerin  (NITROSTAT ) 0.4 MG SL tablet Place 1 tablet (0.4 mg total) under the tongue every 5 (five) minutes as needed for Chest pain May take up to 3 doses. 25 tablet 11  . oxyBUTYnin  (DITROPAN ) 5 mg tablet Take 1 tablet (5 mg total) by mouth 3 (three) times daily (Patient taking differently: Take 5 mg by mouth 3 (three) times daily as needed) 90 tablet 11  . temazepam (RESTORIL) 15 mg capsule Take 1 capsule (15 mg total) by mouth 2  (two) times daily for 180 days 180 capsule 1  . triamcinolone  0.1 % cream Apply topically 2 (two) times daily 30 g 3  . zolpidem  (AMBIEN ) 5 MG tablet Take 1 tablet (5 mg total) by mouth at bedtime 90 tablet 1  . azithromycin (ZITHROMAX) 250 MG tablet Take 2 tablets (500mg ) by mouth on Day 1. Take 1 tablet (250mg ) by mouth on Days 2-5. 6 tablet 0  . chlorphen/phenyleph/DM/aspirin  (ALKA-SELTZER PLUS C/C,PE,DM, ORAL) Take by mouth    . predniSONE (DELTASONE) 10 MG tablet Take 1 tablet (10 mg total) by mouth once daily for 10 days 10 tablet 0   Current Facility-Administered Medications  Medication Dose Route Frequency Provider Last Rate Last Admin  . triamcinolone  acetonide (KENALOG -40) 60 mg injection  60 mg Intramuscular Once Cleotilde Oneil Novel, MD          HPI   CLINICAL SUMMARY:  Patient feels terrible over the weekend, cough, congestion, wheezing.  Rheumatologic disease doing fairly stable.  Coughing up green/yellow sputum  ROS: Review of systems is unremarkable for any active cardiac, respiratory, GI, GU, hematologic, neurologic, dermatologic, HEENT, or psychiatric symptoms except as noted above, 10 systems reviewed.  No fevers, chills, or constitutional symptoms.   PHYSICAL EXAM  Vital signs:  BP 130/70   Pulse 78   Temp 37.2 C (98.9 F)   Wt (!) 100.9 kg (222 lb 6.4 oz)   LMP  (LMP Unknown)   SpO2 96%   BMI 37.01 kg/m  Body mass index is 37.01 kg/m.   Wt Readings from Last 3 Encounters:  03/20/23 (!) 100.9 kg (222 lb 6.4 oz)  03/07/23 100.2 kg (221 lb)  03/06/23 100.2 kg (221 lb)     BP Readings from Last 3 Encounters:  03/20/23 130/70  03/07/23 138/70  03/06/23 (!) 150/82    Constitutional:NAD Neck: supple, no thyromegaly, good ROM Respiratory: Moderate wheezing rhonchi throughout Cardiovascular:RRR, no murmur or gallop Abdominal:soft, good BS, NT Ext: no edema, good peripheral pulses Neuro: alert and oriented X 3, grossly nonfocal     ASSESSMENT/PLAN    Asthmatic bronchitis-postviral, 5 to 6 days and.  No pneumonia.  Kenalog  60 mg IM x 1, azithromycin, prednisone 10 mg daily x 10 days, push fluids Rheumatologic disease-stable  Dispo:   No follow-ups on file.

## 2023-06-05 ENCOUNTER — Other Ambulatory Visit: Payer: Self-pay

## 2023-06-05 ENCOUNTER — Emergency Department
Admission: EM | Admit: 2023-06-05 | Discharge: 2023-06-05 | Disposition: A | Discharge status: Home / Self Care | Attending: Emergency Medicine | Admitting: Emergency Medicine

## 2023-06-05 ENCOUNTER — Emergency Department

## 2023-06-05 DIAGNOSIS — I2510 Atherosclerotic heart disease of native coronary artery without angina pectoris: Secondary | ICD-10-CM | POA: Insufficient documentation

## 2023-06-05 DIAGNOSIS — R0602 Shortness of breath: Secondary | ICD-10-CM | POA: Diagnosis not present

## 2023-06-05 DIAGNOSIS — I10 Essential (primary) hypertension: Secondary | ICD-10-CM | POA: Diagnosis not present

## 2023-06-05 DIAGNOSIS — I480 Paroxysmal atrial fibrillation: Secondary | ICD-10-CM | POA: Insufficient documentation

## 2023-06-05 DIAGNOSIS — R0789 Other chest pain: Secondary | ICD-10-CM | POA: Diagnosis present

## 2023-06-05 DIAGNOSIS — E039 Hypothyroidism, unspecified: Secondary | ICD-10-CM | POA: Diagnosis not present

## 2023-06-05 LAB — BASIC METABOLIC PANEL WITH GFR
Anion gap: 12 (ref 5–15)
BUN: 13 mg/dL (ref 8–23)
CO2: 26 mmol/L (ref 22–32)
Calcium: 9.3 mg/dL (ref 8.9–10.3)
Chloride: 104 mmol/L (ref 98–111)
Creatinine, Ser: 0.94 mg/dL (ref 0.44–1.00)
GFR, Estimated: >60 mL/min (ref >60)
Glucose, Bld: 103 mg/dL — ABNORMAL HIGH (ref 70–99)
Potassium: 3.9 mmol/L (ref 3.5–5.1)
Sodium: 142 mmol/L (ref 135–145)

## 2023-06-05 LAB — CBC
HCT: 39.8 % (ref 36.0–46.0)
Hemoglobin: 12.4 g/dL (ref 12.0–15.0)
MCH: 29.7 pg (ref 26.0–34.0)
MCHC: 31.2 g/dL (ref 30.0–36.0)
MCV: 95.2 fL (ref 80.0–100.0)
Platelets: 287 10*3/uL (ref 150–400)
RBC: 4.18 MIL/uL (ref 3.87–5.11)
RDW: 13.6 % (ref 11.5–15.5)
WBC: 7.2 10*3/uL (ref 4.0–10.5)
nRBC: 0 % (ref 0.0–0.2)

## 2023-06-05 LAB — TROPONIN I (HIGH SENSITIVITY)
Troponin I (High Sensitivity): 5 ng/L (ref <18)
Troponin I (High Sensitivity): 5 ng/L (ref <18)

## 2023-06-05 LAB — BRAIN NATRIURETIC PEPTIDE: B Natriuretic Peptide: 71.1 pg/mL (ref 0.0–100.0)

## 2023-06-05 MED ORDER — ASPIRIN 81 MG PO CHEW
324.0000 mg | CHEWABLE_TABLET | Freq: Once | ORAL | Status: AC
  Administered 2023-06-05: 324 mg via ORAL
  Filled 2023-06-05: qty 4

## 2023-06-05 MED ORDER — ONDANSETRON HCL 4 MG PO TABS: 4.0000 mg | ORAL_TABLET | Freq: Three times a day (TID) | ORAL | 0 refills | Status: AC | PRN

## 2023-06-05 MED ORDER — NITROGLYCERIN 0.4 MG SL SUBL
0.4000 mg | SUBLINGUAL_TABLET | Freq: Once | SUBLINGUAL | Status: AC
  Administered 2023-06-05: 0.4 mg via SUBLINGUAL
  Filled 2023-06-05: qty 1

## 2023-06-05 MED ORDER — LIDOCAINE 5 % EX PTCH
1.0000 | MEDICATED_PATCH | Freq: Once | CUTANEOUS | Status: DC
  Administered 2023-06-05: 1 via TRANSDERMAL
  Filled 2023-06-05: qty 1

## 2023-06-05 MED ORDER — LIDOCAINE 5 % EX PTCH: 1.0000 | MEDICATED_PATCH | CUTANEOUS | 0 refills | Status: AC

## 2023-06-05 MED ORDER — ONDANSETRON HCL 4 MG/2ML IJ SOLN
4.0000 mg | Freq: Once | INTRAMUSCULAR | Status: AC
  Administered 2023-06-05: 4 mg via INTRAVENOUS
  Filled 2023-06-05: qty 2

## 2023-06-05 NOTE — ED Provider Notes (Signed)
 [facility] Provider Note    Event Date/Time   First MD Initiated Contact with Patient 06/05/23 0745     (approximate)   History   Chest Pain   HPI Tonya Walton is a 73 y.o. female with history of CAD and prior MI, A-fib, HTN, HLD, hypothyroidism presenting today for chest pain.  Patient states for the past couple weeks she has had some mild nausea symptoms with no vomiting.  Last night she started having centralized chest pressure which radiated into her left shoulder and neck.  She has had some associated shortness of breath, diaphoresis.  Currently has mild chest pain which has improved.  No active nausea or vomiting.  Denies abdominal pain.  Denies any leg pain or leg swelling.  States last time she had symptoms like this was when she did have a prior heart attack requiring a CABG.  Chart review: Last saw cardiology on 05/30/2021.  Has a history of paroxysmal A-fib not currently anticoagulated.     Physical Exam   Triage Vital Signs: ED Triage Vitals  Encounter Vitals Group     BP 06/05/23 0742 (!) 154/109     Systolic BP Percentile --      Diastolic BP Percentile --      Pulse Rate 06/05/23 0742 83     Resp 06/05/23 0742 20     Temp 06/05/23 0742 97.7 F (36.5 C)     Temp src --      SpO2 06/05/23 0742 98 %     Weight 06/05/23 0740 232 lb (105.2 kg)     Height 06/05/23 0740 5\' 6"  (1.676 m)     Head Circumference --      Peak Flow --      Pain Score 06/05/23 0740 6     Pain Loc --      Pain Education --      Exclude from Growth Chart --     Most recent vital signs: Vitals:   06/05/23 1115 06/05/23 1130  BP:  (!) 146/71  Pulse: 78 75  Resp: 18 10  Temp:    SpO2: 97% 100%   Physical Exam: I have reviewed the vital signs and nursing notes. General: Awake, alert, no acute distress.  Nontoxic appearing. Head:  Atraumatic, normocephalic.   ENT:  EOM intact, PERRL. Oral mucosa is pink and moist with no lesions. Neck: Neck is supple  with full range of motion, No meningeal signs. Cardiovascular:  RRR, No murmurs. Peripheral pulses palpable and equal bilaterally. Respiratory:  Symmetrical chest wall expansion.  No rhonchi, rales, or wheezes.  Good air movement throughout.  No use of accessory muscles.   Musculoskeletal:  No cyanosis or edema. Moving extremities with full ROM Abdomen:  Soft, nontender, nondistended. Neuro:  GCS 15, moving all four extremities, interacting appropriately. Speech clear. Psych:  Calm, appropriate.   Skin:  Warm, dry, no rash.    ED Results / Procedures / Treatments   Labs (all labs ordered are listed, but only abnormal results are displayed) Labs Reviewed  BASIC METABOLIC PANEL WITH GFR - Abnormal; Notable for the following components:      Result Value   Glucose, Bld 103 (*)    All other components within normal limits  CBC  BRAIN NATRIURETIC PEPTIDE  TROPONIN I (HIGH SENSITIVITY)  TROPONIN I (HIGH SENSITIVITY)     EKG My EKG interpretation: Rate of 89, normal sinus rhythm, normal axis, normal intervals.  No acute ST elevations  or depressions.   RADIOLOGY Independently interpreted chest x-ray with no acute pathology   PROCEDURES:  Critical Care performed: No  Procedures   MEDICATIONS ORDERED IN ED: Medications  lidocaine  (LIDODERM ) 5 % 1 patch (1 patch Transdermal Patch Applied 06/05/23 1039)  aspirin  chewable tablet 324 mg (324 mg Oral Given 06/05/23 0833)  ondansetron  (ZOFRAN ) injection 4 mg (4 mg Intravenous Given 06/05/23 1040)  nitroGLYCERIN  (NITROSTAT ) SL tablet 0.4 mg (0.4 mg Sublingual Given 06/05/23 1043)     IMPRESSION / MDM / ASSESSMENT AND PLAN / ED COURSE  I reviewed the triage vital signs and the nursing notes.                              Differential diagnosis includes, but is not limited to, ACS, muscle spasm, pneumonia, pneumothorax, reflux  Patient's presentation is most consistent with acute complicated illness / injury requiring diagnostic  workup.  Patient is a 74 year old female presenting today for vague chest pain mostly in her left trapezius and some nausea symptoms.  Today she is largely been asymptomatic.  Vital signs are stable.  EKG with no ischemic findings.  CBC and BMP unremarkable.  BNP negative.  Chest x-ray unremarkable.  Troponin negative x 2.  Patient was given aspirin  on arrival and then given nitroglycerin , Lidoderm  patch over left trapezius, and Zofran .  This resulted in complete symptomatic resolution.  Does not seem typical etiology of ACS and she had similar symptoms in 2023 which was muscle spasm related and had negative cardiac workup.  We discussed admission but ultimately patient feels fine and states she would like to get this worked up outpatient.  I do think she is safe for discharge with negative troponins and no ongoing symptoms.  Instructed to call her cardiology team and given strict return precautions.  The patient is on the cardiac monitor to evaluate for evidence of arrhythmia and/or significant heart rate changes. Clinical Course as of 06/05/23 1156  Tue Jun 05, 2023  0928 Reassessed patient.  No ongoing chest pain symptoms.  Feels like most of her pain is around her left shoulder where she has some tenderness over her trapezius muscle.  Will apply Lidoderm  patch and repeat troponin.  Lower suspicion for ACS at this time [DW]    Clinical Course User Index [DW] Kandee Orion, MD     FINAL CLINICAL IMPRESSION(S) / ED DIAGNOSES   Final diagnoses:  Atypical chest pain     Rx / DC Orders   ED Discharge Orders          Ordered    lidocaine  (LIDODERM ) 5 %  Every 24 hours        06/05/23 1155    ondansetron  (ZOFRAN ) 4 MG tablet  Every 8 hours PRN        06/05/23 1155             Note:  This document was prepared using Dragon voice recognition software and may include unintentional dictation errors.   Kandee Orion, MD 06/05/23 [phone]

## 2023-06-05 NOTE — ED Triage Notes (Signed)
 Pt comes in from home via pov with complaints of chest pressure and nausea. Pt states that the chest pressure started yesterday, but she woke up this morning with sharp pains in her chest. Pt states that she has CHF, and is also retaining fluid right now. Pt complaint of chest pressure 6/10. Pt is alert and oriented x4 with no signs of acute distress.

## 2023-06-05 NOTE — ED Notes (Signed)
 This RN introduced self to pt. Call light in reach, bed wheels locked, side rail raised, pt updated on plan of care. Rounding completed.

## 2023-06-05 NOTE — Discharge Instructions (Signed)
 Please call your cardiology team for follow-up outpatient.  Please return for any severe worsening symptoms.  I have sent medication to your pharmacy to use as needed to help with your shoulder pain and nausea.

## 2023-07-16 ENCOUNTER — Ambulatory Visit: Payer: Medicare HMO | Admitting: Urology

## 2023-07-16 ENCOUNTER — Other Ambulatory Visit: Payer: Self-pay | Admitting: *Deleted

## 2023-07-16 DIAGNOSIS — N200 Calculus of kidney: Secondary | ICD-10-CM

## 2023-07-18 ENCOUNTER — Ambulatory Visit: Payer: Self-pay | Admitting: Urology

## 2023-08-16 ENCOUNTER — Inpatient Hospital Stay
Admission: EM | Admit: 2023-08-16 | Discharge: 2023-08-24 | DRG: 534 | Disposition: A | Discharge status: Skilled Nursing Facility | Attending: Internal Medicine | Admitting: Internal Medicine

## 2023-08-16 ENCOUNTER — Encounter: Payer: Self-pay | Admitting: Emergency Medicine

## 2023-08-16 ENCOUNTER — Emergency Department

## 2023-08-16 ENCOUNTER — Other Ambulatory Visit: Payer: Self-pay

## 2023-08-16 DIAGNOSIS — Z7982 Long term (current) use of aspirin: Secondary | ICD-10-CM | POA: Diagnosis not present

## 2023-08-16 DIAGNOSIS — Z8249 Family history of ischemic heart disease and other diseases of the circulatory system: Secondary | ICD-10-CM

## 2023-08-16 DIAGNOSIS — W010XXA Fall on same level from slipping, tripping and stumbling without subsequent striking against object, initial encounter: Secondary | ICD-10-CM | POA: Diagnosis present

## 2023-08-16 DIAGNOSIS — G2581 Restless legs syndrome: Secondary | ICD-10-CM | POA: Diagnosis present

## 2023-08-16 DIAGNOSIS — F32A Depression, unspecified: Secondary | ICD-10-CM | POA: Diagnosis present

## 2023-08-16 DIAGNOSIS — T84019A Broken internal joint prosthesis, unspecified site, initial encounter: Secondary | ICD-10-CM | POA: Diagnosis not present

## 2023-08-16 DIAGNOSIS — E876 Hypokalemia: Secondary | ICD-10-CM | POA: Diagnosis present

## 2023-08-16 DIAGNOSIS — M3500 Sicca syndrome, unspecified: Secondary | ICD-10-CM | POA: Diagnosis present

## 2023-08-16 DIAGNOSIS — M109 Gout, unspecified: Secondary | ICD-10-CM | POA: Diagnosis present

## 2023-08-16 DIAGNOSIS — N1831 Chronic kidney disease, stage 3a: Secondary | ICD-10-CM | POA: Diagnosis present

## 2023-08-16 DIAGNOSIS — Z9842 Cataract extraction status, left eye: Secondary | ICD-10-CM

## 2023-08-16 DIAGNOSIS — M25461 Effusion, right knee: Principal | ICD-10-CM

## 2023-08-16 DIAGNOSIS — N179 Acute kidney failure, unspecified: Secondary | ICD-10-CM | POA: Diagnosis not present

## 2023-08-16 DIAGNOSIS — Z6836 Body mass index (BMI) 36.0-36.9, adult: Secondary | ICD-10-CM

## 2023-08-16 DIAGNOSIS — Z79890 Hormone replacement therapy (postmenopausal): Secondary | ICD-10-CM

## 2023-08-16 DIAGNOSIS — I2510 Atherosclerotic heart disease of native coronary artery without angina pectoris: Secondary | ICD-10-CM | POA: Diagnosis present

## 2023-08-16 DIAGNOSIS — E66812 Obesity, class 2: Secondary | ICD-10-CM | POA: Diagnosis present

## 2023-08-16 DIAGNOSIS — Z923 Personal history of irradiation: Secondary | ICD-10-CM

## 2023-08-16 DIAGNOSIS — Z9841 Cataract extraction status, right eye: Secondary | ICD-10-CM

## 2023-08-16 DIAGNOSIS — F03A3 Unspecified dementia, mild, with mood disturbance: Secondary | ICD-10-CM | POA: Diagnosis present

## 2023-08-16 DIAGNOSIS — Z79899 Other long term (current) drug therapy: Secondary | ICD-10-CM

## 2023-08-16 DIAGNOSIS — G4700 Insomnia, unspecified: Secondary | ICD-10-CM | POA: Diagnosis present

## 2023-08-16 DIAGNOSIS — F419 Anxiety disorder, unspecified: Secondary | ICD-10-CM | POA: Diagnosis present

## 2023-08-16 DIAGNOSIS — E785 Hyperlipidemia, unspecified: Secondary | ICD-10-CM | POA: Diagnosis present

## 2023-08-16 DIAGNOSIS — Z806 Family history of leukemia: Secondary | ICD-10-CM

## 2023-08-16 DIAGNOSIS — Z791 Long term (current) use of non-steroidal anti-inflammatories (NSAID): Secondary | ICD-10-CM

## 2023-08-16 DIAGNOSIS — I480 Paroxysmal atrial fibrillation: Secondary | ICD-10-CM | POA: Diagnosis present

## 2023-08-16 DIAGNOSIS — E039 Hypothyroidism, unspecified: Secondary | ICD-10-CM | POA: Diagnosis present

## 2023-08-16 DIAGNOSIS — Z961 Presence of intraocular lens: Secondary | ICD-10-CM | POA: Diagnosis present

## 2023-08-16 DIAGNOSIS — J439 Emphysema, unspecified: Secondary | ICD-10-CM | POA: Diagnosis present

## 2023-08-16 DIAGNOSIS — Z7901 Long term (current) use of anticoagulants: Secondary | ICD-10-CM

## 2023-08-16 DIAGNOSIS — Z951 Presence of aortocoronary bypass graft: Secondary | ICD-10-CM

## 2023-08-16 DIAGNOSIS — Z8673 Personal history of transient ischemic attack (TIA), and cerebral infarction without residual deficits: Secondary | ICD-10-CM

## 2023-08-16 DIAGNOSIS — Y92007 Garden or yard of unspecified non-institutional (private) residence as the place of occurrence of the external cause: Secondary | ICD-10-CM | POA: Diagnosis not present

## 2023-08-16 DIAGNOSIS — Z853 Personal history of malignant neoplasm of breast: Secondary | ICD-10-CM

## 2023-08-16 DIAGNOSIS — S72435A Nondisplaced fracture of medial condyle of left femur, initial encounter for closed fracture: Secondary | ICD-10-CM | POA: Diagnosis present

## 2023-08-16 DIAGNOSIS — Z833 Family history of diabetes mellitus: Secondary | ICD-10-CM | POA: Diagnosis not present

## 2023-08-16 DIAGNOSIS — Z87891 Personal history of nicotine dependence: Secondary | ICD-10-CM | POA: Diagnosis not present

## 2023-08-16 DIAGNOSIS — Z803 Family history of malignant neoplasm of breast: Secondary | ICD-10-CM

## 2023-08-16 DIAGNOSIS — M25561 Pain in right knee: Secondary | ICD-10-CM

## 2023-08-16 DIAGNOSIS — S72415A Nondisplaced unspecified condyle fracture of lower end of left femur, initial encounter for closed fracture: Secondary | ICD-10-CM | POA: Diagnosis not present

## 2023-08-16 DIAGNOSIS — I1310 Hypertensive heart and chronic kidney disease without heart failure, with stage 1 through stage 4 chronic kidney disease, or unspecified chronic kidney disease: Secondary | ICD-10-CM | POA: Diagnosis present

## 2023-08-16 DIAGNOSIS — M25562 Pain in left knee: Secondary | ICD-10-CM | POA: Diagnosis present

## 2023-08-16 DIAGNOSIS — Z96653 Presence of artificial knee joint, bilateral: Secondary | ICD-10-CM | POA: Diagnosis present

## 2023-08-16 DIAGNOSIS — K219 Gastro-esophageal reflux disease without esophagitis: Secondary | ICD-10-CM | POA: Diagnosis present

## 2023-08-16 LAB — CBC WITH DIFFERENTIAL/PLATELET
Abs Immature Granulocytes: 0.04 K/uL (ref 0.00–0.07)
Basophils Absolute: 0.1 K/uL (ref 0.0–0.1)
Basophils Relative: 1 %
Eosinophils Absolute: 0.1 K/uL (ref 0.0–0.5)
Eosinophils Relative: 1 %
HCT: 32.5 % — ABNORMAL LOW (ref 36.0–46.0)
Hemoglobin: 10.6 g/dL — ABNORMAL LOW (ref 12.0–15.0)
Immature Granulocytes: 1 %
Lymphocytes Relative: 21 %
Lymphs Abs: 1.8 K/uL (ref 0.7–4.0)
MCH: 30.5 pg (ref 26.0–34.0)
MCHC: 32.6 g/dL (ref 30.0–36.0)
MCV: 93.7 fL (ref 80.0–100.0)
Monocytes Absolute: 0.9 K/uL (ref 0.1–1.0)
Monocytes Relative: 10 %
Neutro Abs: 5.6 K/uL (ref 1.7–7.7)
Neutrophils Relative %: 66 %
Platelets: 240 K/uL (ref 150–400)
RBC: 3.47 MIL/uL — ABNORMAL LOW (ref 3.87–5.11)
RDW: 13.8 % (ref 11.5–15.5)
WBC: 8.5 K/uL (ref 4.0–10.5)
nRBC: 0 % (ref 0.0–0.2)

## 2023-08-16 LAB — BASIC METABOLIC PANEL WITH GFR
Anion gap: 11 (ref 5–15)
BUN: 33 mg/dL — ABNORMAL HIGH (ref 8–23)
CO2: 21 mmol/L — ABNORMAL LOW (ref 22–32)
Calcium: 8.8 mg/dL — ABNORMAL LOW (ref 8.9–10.3)
Chloride: 104 mmol/L (ref 98–111)
Creatinine, Ser: 1.4 mg/dL — ABNORMAL HIGH (ref 0.44–1.00)
GFR, Estimated: 39 mL/min — ABNORMAL LOW (ref >60)
Glucose, Bld: 91 mg/dL (ref 70–99)
Potassium: 3.4 mmol/L — ABNORMAL LOW (ref 3.5–5.1)
Sodium: 136 mmol/L (ref 135–145)

## 2023-08-16 MED ORDER — FLUOXETINE HCL 20 MG PO CAPS
40.0000 mg | ORAL_CAPSULE | Freq: Every day | ORAL | Status: DC
  Administered 2023-08-17 – 2023-08-24 (×8): 40 mg via ORAL
  Filled 2023-08-16 (×8): qty 2

## 2023-08-16 MED ORDER — ALLOPURINOL 100 MG PO TABS
100.0000 mg | ORAL_TABLET | Freq: Two times a day (BID) | ORAL | Status: DC
  Administered 2023-08-17 – 2023-08-24 (×15): 100 mg via ORAL
  Filled 2023-08-16 (×15): qty 1

## 2023-08-16 MED ORDER — ZOLPIDEM TARTRATE 5 MG PO TABS
5.0000 mg | ORAL_TABLET | Freq: Every day | ORAL | Status: DC
  Administered 2023-08-17 – 2023-08-23 (×8): 5 mg via ORAL
  Filled 2023-08-16 (×8): qty 1

## 2023-08-16 MED ORDER — PANTOPRAZOLE SODIUM 40 MG PO TBEC
40.0000 mg | DELAYED_RELEASE_TABLET | Freq: Every day | ORAL | Status: DC
  Administered 2023-08-17 – 2023-08-24 (×8): 40 mg via ORAL
  Filled 2023-08-16 (×8): qty 1

## 2023-08-16 MED ORDER — MORPHINE SULFATE (PF) 2 MG/ML IV SOLN
0.5000 mg | INTRAVENOUS | Status: DC | PRN
  Administered 2023-08-16 – 2023-08-17 (×4): 0.5 mg via INTRAVENOUS
  Filled 2023-08-16 (×4): qty 1

## 2023-08-16 MED ORDER — ENOXAPARIN SODIUM 60 MG/0.6ML IJ SOSY: 0.5000 mg/kg | PREFILLED_SYRINGE | INTRAMUSCULAR | Status: DC

## 2023-08-16 MED ORDER — LEVOTHYROXINE SODIUM 25 MCG PO TABS
25.0000 ug | ORAL_TABLET | Freq: Every day | ORAL | Status: DC
  Administered 2023-08-17 – 2023-08-24 (×8): 25 ug via ORAL
  Filled 2023-08-16 (×8): qty 1

## 2023-08-16 MED ORDER — TRAMADOL HCL 50 MG PO TABS
50.0000 mg | ORAL_TABLET | Freq: Once | ORAL | Status: AC
  Administered 2023-08-16: 50 mg via ORAL
  Filled 2023-08-16: qty 1

## 2023-08-16 MED ORDER — CELECOXIB 200 MG PO CAPS
200.0000 mg | ORAL_CAPSULE | Freq: Two times a day (BID) | ORAL | Status: DC
  Filled 2023-08-16: qty 1

## 2023-08-16 MED ORDER — APIXABAN 5 MG PO TABS
5.0000 mg | ORAL_TABLET | Freq: Two times a day (BID) | ORAL | Status: DC
  Administered 2023-08-17 – 2023-08-24 (×16): 5 mg via ORAL
  Filled 2023-08-16 (×16): qty 1

## 2023-08-16 MED ORDER — HYDROXYCHLOROQUINE SULFATE 200 MG PO TABS
200.0000 mg | ORAL_TABLET | Freq: Two times a day (BID) | ORAL | Status: DC
  Administered 2023-08-17 – 2023-08-24 (×15): 200 mg via ORAL
  Filled 2023-08-16 (×15): qty 1

## 2023-08-16 MED ORDER — ASPIRIN 81 MG PO TBEC
81.0000 mg | DELAYED_RELEASE_TABLET | Freq: Every day | ORAL | Status: DC
  Administered 2023-08-17 – 2023-08-24 (×7): 81 mg via ORAL
  Filled 2023-08-16 (×7): qty 1

## 2023-08-16 MED ORDER — MIRABEGRON ER 25 MG PO TB24
25.0000 mg | ORAL_TABLET | Freq: Every day | ORAL | Status: DC
  Administered 2023-08-17 – 2023-08-24 (×8): 25 mg via ORAL
  Filled 2023-08-16 (×8): qty 1

## 2023-08-16 MED ORDER — ONDANSETRON HCL 4 MG PO TABS: 4.0000 mg | ORAL_TABLET | Freq: Three times a day (TID) | ORAL | Status: DC | PRN

## 2023-08-16 MED ORDER — LAMOTRIGINE 25 MG PO TABS
75.0000 mg | ORAL_TABLET | Freq: Two times a day (BID) | ORAL | Status: DC
  Administered 2023-08-17 – 2023-08-24 (×16): 75 mg via ORAL
  Filled 2023-08-16 (×16): qty 3

## 2023-08-16 MED ORDER — SENNOSIDES-DOCUSATE SODIUM 8.6-50 MG PO TABS
1.0000 | ORAL_TABLET | Freq: Every evening | ORAL | Status: DC | PRN
  Administered 2023-08-19: 1 via ORAL
  Filled 2023-08-16: qty 1

## 2023-08-16 MED ORDER — DONEPEZIL HCL 5 MG PO TABS
5.0000 mg | ORAL_TABLET | Freq: Every day | ORAL | Status: DC
  Administered 2023-08-17 – 2023-08-23 (×8): 5 mg via ORAL
  Filled 2023-08-16 (×8): qty 1

## 2023-08-16 MED ORDER — ATORVASTATIN CALCIUM 80 MG PO TABS
80.0000 mg | ORAL_TABLET | ORAL | Status: DC
  Administered 2023-08-17 – 2023-08-24 (×8): 80 mg via ORAL
  Filled 2023-08-16 (×3): qty 1
  Filled 2023-08-16: qty 4
  Filled 2023-08-16 (×4): qty 1

## 2023-08-16 MED ORDER — HYDROCODONE-ACETAMINOPHEN 5-325 MG PO TABS
1.0000 | ORAL_TABLET | Freq: Four times a day (QID) | ORAL | Status: DC | PRN
  Administered 2023-08-17: 2 via ORAL
  Administered 2023-08-18 – 2023-08-19 (×2): 1 via ORAL
  Filled 2023-08-16 (×2): qty 1
  Filled 2023-08-16: qty 2

## 2023-08-16 NOTE — ED Triage Notes (Signed)
 Patient to ED via [facility] from home after a mechanical fall. PT reports falling on wet soil hurting her left knee. Denies hitting her head, LOC or blood thinners. HX of knee replacement

## 2023-08-16 NOTE — ED Provider Triage Note (Signed)
 Emergency Medicine Provider Triage Evaluation Note  Tonya Walton , a 73 y.o. female  was evaluated in triage.  Pt complains of left knee pain.  Patient was in her garden, stepped on a wet soil felt.  Patient denies loss of consciousness.  She does not taking blood thinners.  Patient states she cannot bear weight  Review of Systems  Positive:  Negative:   Physical Exam  BP 138/63   Pulse 64   Temp 98 F (36.7 C) (Oral)   Resp 17   Ht 5' 6 (1.676 m)   Wt 102.1 kg   SpO2 98%   BMI 36.32 kg/m  Gen:   Awake, no distress during triage vital signs were normal Resp:  Normal effort  MSK:   Moves extremities without difficulty  Other:  Left knee: Skin is intact, no evidence of ecchymosis or hematomas.  Presence of old scar from knee replacement.  Not tender to palpation.  Medical Decision Making  Medically screening exam initiated at 6:15 PM.  Appropriate orders placed.  Tonya Walton was informed that the remainder of the evaluation will be completed by another provider, this initial triage assessment does not replace that evaluation, and the importance of remaining in the ED until their evaluation is complete.  Patient with mechanical fall, with left knee pain.  Ordered x-ray   Janit Kast, PA-C 08/16/23 1818

## 2023-08-16 NOTE — ED Notes (Signed)
 Pt transferred from recliner to stretcher x4 person assist. Pt to radiology at this time.

## 2023-08-16 NOTE — Progress Notes (Signed)
 Anticoagulation monitoring(Lovenox ):  73 yo  female ordered Lovenox  40 mg Q24h    Filed Weights   08/16/23 1814  Weight: 102.1 kg (225 lb)   BMI 36.3    Lab Results  Component Value Date   CREATININE 0.94 06/05/2023   CREATININE 1.38 (H) 04/11/2021   CREATININE 0.85 08/03/2020   CrCl cannot be calculated (Patient's most recent lab result is older than the maximum 21 days allowed.). Hemoglobin & Hematocrit     Component Value Date/Time   HGB 12.4 06/05/2023 0828   HGB 12.0 05/27/2014 0840   HCT 39.8 06/05/2023 0828   HCT 37.8 05/27/2014 0840     Per Protocol for Patient with estCrcl > 30 ml/min and BMI > 30, will transition to Lovenox  50 mg Q24h.

## 2023-08-16 NOTE — H&P (Signed)
 History and Physical    Patient: Tonya Walton FMW:[id number] DOB: 1950/11/24 DOA: 08/16/2023 DOS: the patient was seen and examined on 08/16/2023 PCP: Cleotilde Oneil FALCON, MD  Patient coming from: Home  Chief Complaint:  Chief Complaint  Patient presents with   Knee Pain   HPI: Tonya Walton is a 73 y.o. female with medical history significant of atrial fibrillation, coronary disease status post CABG, atrial fibrillation on anticoagulation, history of bilateral knee replacement.  Patient had been in her usual state of health and was walking in her backyard today when she slipped on a watery surface and fell.  She developed sudden onset of left knee joint pain.  Pain was so severe and she has been unable to ambulate on her feet since event.  Patient was brought into the emergency room to be further assessed.  Initial x-ray did reveal impacted fracture of the left medial condyle.  CT scan was requested by orthopedics with findings as noted below  Status post knee replacement with normal alignment. Subtle cortical step-off at the medial metaphyseal femoral cortex above the prosthesis suspicious for slightly impacted nondisplaced fracture. 2. Moderate knee effusion with small hematocrit level.  Due to significant pain, patient is unable to ambulate.  Patient was referred to hospitalist service for optimization of pain management and possible assessment for acute rehab placement.  Review of Systems: As mentioned in the history of present illness. All other systems reviewed and are negative. Past Medical History:  Diagnosis Date   Anxiety    Arthritis    Atrial fibrillation (HCC)    CHF (congestive heart failure) (HCC)    Chronic kidney disease    COPD (chronic obstructive pulmonary disease) (HCC)    Coronary artery disease    Depression    Family history of adverse reaction to anesthesia    brothers had a lung collaspe during an unknown procedure.   GERD (gastroesophageal reflux disease)     Headache    History of kidney stones    Hx of CABG 04/01/2015   Single vessel; LIMA-LAD   Hyperlipidemia    Hypertension    Hypothyroidism    Insomnia    Invasive ductal carcinoma of breast, female, right (HCC) 09/27/2010   Stage 1A; ER/PR (+) and HER2/neu indeterminant; pT1b pN0 (sn)   Neuromuscular disorder (HCC)    nerve damage in legs   Personal history of radiation therapy 2012   BREAST CA   Restless leg syndrome    Seizures (HCC)    Shortness of breath dyspnea    Sjogren's syndrome (HCC)    Stroke (HCC)    Some mild weakness in left side residual   Vitamin B12 deficiency    Past Surgical History:  Procedure Laterality Date   APPENDECTOMY     BREAST BIOPSY Left 07/2013   NEG   BREAST BIOPSY Right 08/22/2016   benign   BREAST BIOPSY Right 01/07/2019   asymmetry bx with stereo, coil marker,fat necrosis   BREAST BIOPSY Right 01/07/2019   asymm affirm bx,  x marker,  fat necrosis   BREAST BIOPSY Right 03/25/2020   stereo bx, ribbon clip, neg fat nec   BREAST EXCISIONAL BIOPSY Right 2012   POS   BREAST LUMPECTOMY  2012   Right breast   CARDIAC CATHETERIZATION N/A 03/02/2015   Procedure: Left Heart Cath and Coronary Angiography;  Surgeon: Vinie DELENA Jude, MD;  Location: [facility];  Service: Cardiovascular;  Laterality: N/A;   CATARACT EXTRACTION W/PHACO Right  10/14/2014   Procedure: CATARACT EXTRACTION PHACO AND INTRAOCULAR LENS PLACEMENT (IOC);  Surgeon: Dene Etienne, MD;  Location: [facility];  Service: Ophthalmology;  Laterality: Right;   CATARACT EXTRACTION W/PHACO Left 10/05/2021   Procedure: CATARACT EXTRACTION PHACO AND INTRAOCULAR LENS PLACEMENT (IOC) LEFT 7.38 00:52.6;  Surgeon: Etienne Dene, MD;  Location: [facility];  Service: Ophthalmology;  Laterality: Left;   COLONOSCOPY     COLONOSCOPY WITH PROPOFOL  N/A 02/17/2021   Procedure: COLONOSCOPY WITH PROPOFOL ;  Surgeon: Onita Elspeth Sharper, DO;  Location: [facility]  ENDOSCOPY;  Service: Gastroenterology;  Laterality: N/A;   CORONARY ARTERY BYPASS GRAFT N/A 04/01/2015   Procedure: CORONARY ARTERY BYPASS GRAFTING (CABG) x one , using left mammary artery;  Surgeon: Maude Fleeta Ochoa, MD;  Location: [facility] OR;  Service: Open Heart Surgery;  Laterality: N/A;   CYSTOSCOPY/URETEROSCOPY/HOLMIUM LASER/STENT PLACEMENT Left 07/06/2020   Procedure: CYSTOSCOPY/URETEROSCOPY/STENT PLACEMENT/ retrograde pyelogram;  Surgeon: Twylla Glendia BROCKS, MD;  Location: [facility] ORS;  Service: Urology;  Laterality: Left;   CYSTOSCOPY/URETEROSCOPY/HOLMIUM LASER/STENT PLACEMENT Left 07/27/2020   Procedure: CYSTOSCOPY/URETEROSCOPY/HOLMIUM LASER/STENT EXCHANGE;  Surgeon: Twylla Glendia BROCKS, MD;  Location: [facility] ORS;  Service: Urology;  Laterality: Left;   JOINT REPLACEMENT Right    Total Knee Replacement   TEE WITHOUT CARDIOVERSION N/A 04/01/2015   Procedure: TRANSESOPHAGEAL ECHOCARDIOGRAM (TEE);  Surgeon: Maude Fleeta Ochoa, MD;  Location: [facility] OR;  Service: Open Heart Surgery;  Laterality: N/A;   TONSILLECTOMY     TOTAL KNEE ARTHROPLASTY Right 06/10/2014   Procedure: TOTAL KNEE ARTHROPLASTY;  Surgeon: Lynwood SHAUNNA Hue, MD;  Location: [facility] ORS;  Service: Orthopedics;  Laterality: Right;   TOTAL KNEE ARTHROPLASTY Left 09/02/2014   Procedure: TOTAL KNEE ARTHROPLASTY;  Surgeon: Lynwood SHAUNNA Hue, MD;  Location: [facility] ORS;  Service: Orthopedics;  Laterality: Left;   Social History:  reports that she quit smoking about 31 years ago. Her smoking use included cigarettes. She has never used smokeless tobacco. She reports that she does not drink alcohol  and does not use drugs.  No Known Allergies  Family History  Problem Relation Age of Onset   Leukemia Mother    Bone cancer Father    Heart disease Brother    Heart disease Brother    Diabetes Brother    Hypertension Brother    Diabetes Brother    Hypertension Brother    Heart disease Brother    Breast cancer Paternal Aunt     Prior to Admission medications    Medication Sig Start Date End Date Taking? Authorizing Provider  allopurinol  (ZYLOPRIM ) 100 MG tablet Take 100 mg by mouth 2 (two) times daily. 08/12/20   [provider]  aspirin  EC 81 MG tablet Take 81 mg by mouth daily. 07/23/08   [provider]  atorvastatin  (LIPITOR ) 80 MG tablet Take 80 mg by mouth every morning. 06/13/20   [provider]  carboxymethylcellul-glycerin  (REFRESH OPTIVE) 0.5-0.9 % ophthalmic solution Apply to eye.    [provider]  celecoxib  (CELEBREX ) 200 MG capsule Take 200 mg by mouth 2 (two) times daily. 08/25/20   [provider]  Cholecalciferol (VITAMIN D3) 2000 units capsule Take 2,000 Units by mouth daily.     [provider]  clonazePAM  (KLONOPIN ) 1 MG tablet every evening. 09/06/20   [provider]  colchicine 0.6 MG tablet Take 0.6 mg by mouth 2 (two) times daily as needed (gout). 01/02/20   [provider]  cyclobenzaprine  (FLEXERIL ) 10 MG tablet Take 10 mg by mouth daily as needed.  [provider]  donepezil  (ARICEPT ) 5 MG tablet Take 5 mg by mouth at bedtime. 05/30/20   [provider]  FLUoxetine  (PROZAC ) 40 MG capsule Take 40 mg by mouth daily.    [provider]  hydroxychloroquine  (PLAQUENIL ) 200 MG tablet Take 200 mg by mouth 2 (two) times daily. 05/31/20   [provider]  lamoTRIgine  (LAMICTAL ) 25 MG tablet TAKE 2 TABLETS BY MOUTH EVERY DAY Patient taking differently: Take 75 mg by mouth 2 (two) times daily. 07/22/18   Mohammed Goldstein, MD  levofloxacin  (LEVAQUIN ) 500 MG tablet Take 500 mg by mouth daily. 04/06/21   [provider]  levothyroxine  (SYNTHROID , LEVOTHROID) 25 MCG tablet Take 25 mcg by mouth daily before breakfast.    [provider]  meloxicam (MOBIC) 15 MG tablet Take 15 mg by mouth daily. 07/15/15   [provider]  mirabegron  ER (MYRBETRIQ ) 25 MG TB24 tablet Take 1 tablet (25 mg total) by mouth daily. 09/10/20    Stoioff, Glendia BROCKS, MD  montelukast  (SINGULAIR ) 10 MG tablet Take 10 mg by mouth at bedtime.    [provider]  ondansetron  (ZOFRAN ) 4 MG tablet Take 1 tablet (4 mg total) by mouth every 8 (eight) hours as needed. 06/05/23 06/04/24  Malvina Alm DASEN, MD  pantoprazole  (PROTONIX ) 40 MG tablet Take 1 tablet by mouth daily. 07/29/20   [provider]  Riboflavin (VITAMIN B-2 PO) Take 400 mg by mouth daily.    [provider]  sucralfate (CARAFATE) 1 g tablet Take 1 g by mouth 4 (four) times daily as needed (heartburn).    [provider]  Thiamine HCl (B-1) 500 MG TABS Take 500 mg by mouth daily.    [provider]  triamcinolone  cream (KENALOG ) 0.1 % Apply topically. 09/06/20   [provider]  trimethoprim  (TRIMPEX ) 100 MG tablet Take 1 tablet (100 mg total) by mouth daily. 04/11/21   Stoioff, Glendia BROCKS, MD  vitamin B-12 (CYANOCOBALAMIN ) 1000 MCG tablet Take 1,000 mcg by mouth daily.    [provider]  zolpidem  (AMBIEN ) 5 MG tablet Take 5 mg by mouth at bedtime. 02/02/17   [provider]    Physical Exam: Vitals:   08/16/23 1813 08/16/23 1814  BP: 138/63   Pulse: 64   Resp: 17   Temp: 98 F (36.7 C)   TempSrc: Oral   SpO2: 98%   Weight:  102.1 kg  Height:  5' 6 (1.676 m)   Patient is an obese pleasant female who is laying comfortably in bed not in any acute distress at this time. Head is atraumatic oral mucosa moist.  Pupils equal round reactive to light and accommodation. Chest: Clinically diminished due to body habitus Cardiovascular: Regular S1-S2 with no murmur Abdomen: Soft nontender with no other megaly Extremities without any significant pedal edema.  Left knee with swelling noted.  Tender to touch.  Unable to bend knees due to profound pain.  Incisional scar suggesting prior knee surgery.  Data Reviewed: Labs: None done on admission.  CT scan with findings as noted below: Status post knee replacement with normal  alignment. Artifact limits assessment of bony detail. Subtle cortical step-off at the medial metaphyseal femoral cortex above the prosthesis suspicious for slightly impacted nondisplaced fracture. Caudal extent of lucency difficult to evaluate due to extensive artifact from the hardware, fracture lucency may extend to prosthetic surface anteriorly on coronal views. 2. Moderate knee effusion with small hematocrit level. There are no new results to review  at this time.  Assessment and Plan: 73 year old obese female with medical comorbidities that include coronary disease status post CABG, atrial fibrillation on anticoagulation, obesity, had a fall in her backyard today sustaining any injury on her previously replaced knee joint.  CT scan with findings of nondisplaced impacted fracture of the medial femoral condyle.  Initial assessment by orthopedic surgery is no surgical intervention at this time.  Conservative management being planned.  #1.  Closed impacted left medial femoral condyle fracture: Secondary to mechanical fall.  Patient will be optimized for her pain with analgesics.  Initial remote assessment by orthopedics suggested no surgical interventions planned.  Left lower extremity to be immobilized with plans for further assessment by orthopedics in AM.  PT and OT will be consulted to evaluate and advise on disposition  #History of Sjogren syndrome: Patient is on Plaquenil  200 mg twice daily.  Follow-up with rheumatology as outpatient  #History of gout: Patient is on allopurinol .  No evidence of acute exacerbation at this time.  #History of coronary disease status post CABG.  Denies any chest pain at this time. Patient will continue with her home medications as appropriate.  #History of atrial fibrillation: Currently rate controlled.  Patient reports being on chronic anticoagulation with Eliquis .  Will continue Eliquis  as patient is no plan for any surgical intervention at this  time.  #COPD: Not in any acute exacerbation.   #Morbid obesity  #: Mild to moderate dementia on Aricept . Patient also has underlying history of depression.   Advance Care Planning:   Code Status: Full Code   Consults: Orthopedics consult  Family Communication: No family at bedside  Severity of Illness: The appropriate patient status for this patient is INPATIENT. Inpatient status is judged to be reasonable and necessary in order to provide the required intensity of service to ensure the patient's safety. The patient's presenting symptoms, physical exam findings, and initial radiographic and laboratory data in the context of their chronic comorbidities is felt to place them at high risk for further clinical deterioration. Furthermore, it is not anticipated that the patient will be medically stable for discharge from the hospital within 2 midnights of admission.   * I certify that at the point of admission it is my clinical judgment that the patient will require inpatient hospital care spanning beyond 2 midnights from the point of admission due to high intensity of service, high risk for further deterioration and high frequency of surveillance required.*  Author: Maude MARLA Dart, MD 08/16/2023 9:54 PM  For on call review [email or web address].

## 2023-08-16 NOTE — ED Notes (Signed)
 First Nurse Note: Pt to ED via [facility] from home for fall around 1100. Pt is having pain in her left knee. Pain increases when trying to move leg. Pt did not hit her head. Pt is not on blood thinners. Vital signs stable with EMS.

## 2023-08-16 NOTE — ED Provider Notes (Cosign Needed Addendum)
 [facility] Provider Note    Event Date/Time   First MD Initiated Contact with Patient 08/16/23 1840     (approximate)   History   Knee Pain    HPI  Tonya Walton is a 73 y.o. female    with a past medical history of atypical chest pain, nephrolithiasis, bronchitis, Sjogren syndrome, panlobular, emphysema, A-fib, UTI,  who presents to the ED complaining of left knee pain. According to the patient, she was in the garden step on a wet saw and she felt.  Patient reports left knee pain unable to bear weight.  Before the fall today patient was able to walk.  Patient states she lives at home with her husband, her husband has a stroke and nobody can take care of of her or pick her up at the hospital.  Patient is requesting referral to rehab.  Patient is here by herself.     Patient Active Problem List   Diagnosis Date Noted   Coronary artery disease with history of myocardial infarction without history of CABG 07/30/2020   Gastroenteritis 07/30/2020   Neutrophilic leukocytosis 07/30/2020   AKI (acute kidney injury) (HCC) 07/30/2020   Peripheral neuropathy, idiopathic 07/11/2017   Seizure disorder (HCC) 06/12/2017   Syncope 05/25/2017   Medicare annual wellness visit, initial 02/24/2016   Chest pain 04/01/2015   CAD in native artery 03/10/2015   Unstable angina pectoris (HCC) 02/23/2015   Benign essential HTN 10/20/2014   Chronic anxiety 10/20/2014   Anxiety, generalized 10/20/2014   Insomnia, persistent 10/20/2014   Recurrent major depressive episodes (HCC) 10/20/2014   Depression, major, single episode, severe (HCC) 10/20/2014   Combined fat and carbohydrate induced hyperlipemia 10/20/2014   Restless leg syndrome 10/20/2014   MDD (major depressive disorder), recurrent episode, moderate (HCC) 10/20/2014   AF (paroxysmal atrial fibrillation) (HCC) 07/31/2014   Paroxysmal atrial fibrillation (HCC) 07/31/2014   H/O total knee replacement 06/25/2014    Total knee replacement status 06/10/2014   H/O malignant neoplasm of breast 05/24/2014   Restless leg 05/24/2014   H/O: HTN (hypertension) 05/24/2014   History of atrial fibrillation 05/24/2014   CAFL (chronic airflow limitation) (HCC) 05/24/2014   Vitamin B12 deficiency 05/24/2014   H/O disease 05/24/2014   H/O: hypothyroidism 05/24/2014   H/O gastrointestinal disease 05/24/2014   Addison anemia 02/16/2014   Anxiety 11/06/2013   A-fib (HCC) 11/06/2013   Clinical depression 11/06/2013   Acid reflux 11/06/2013   Heart valve disease 11/06/2013   Primary cancer of right female breast (HCC) 11/06/2013   HLD (hyperlipidemia) 11/06/2013   BP (high blood pressure) 11/06/2013   Atrial fibrillation (HCC) 11/06/2013   Chronic obstructive pulmonary disease (HCC) 11/06/2013   Hormone receptor positive malignant neoplasm of breast (HCC) 11/06/2013   Adult hypothyroidism 06/29/2010   Adult BMI 30+ 06/29/2010   B-complex deficiency 12/05/1995   Extremity pain 12/05/1995     ROS: Patient currently denies any vision changes, tinnitus, difficulty speaking, facial droop, sore throat, chest pain, shortness of breath, abdominal pain, nausea/vomiting/diarrhea, dysuria, or weakness/numbness/paresthesias in any extremity   Physical Exam   Triage Vital Signs: ED Triage Vitals  Encounter Vitals Group     BP 08/16/23 1813 138/63     Girls Systolic BP Percentile --      Girls Diastolic BP Percentile --      Boys Systolic BP Percentile --      Boys Diastolic BP Percentile --      Pulse Rate 08/16/23 1813 64  Resp 08/16/23 1813 17     Temp 08/16/23 1813 98 F (36.7 C)     Temp Source 08/16/23 1813 Oral     SpO2 08/16/23 1813 98 %     Weight 08/16/23 1814 225 lb (102.1 kg)     Height 08/16/23 1814 5' 6 (1.676 m)     Head Circumference --      Peak Flow --      Pain Score 08/16/23 1814 7     Pain Loc --      Pain Education --      Exclude from Growth Chart --     Most recent vital  signs: Vitals:   08/16/23 1813  BP: 138/63  Pulse: 64  Resp: 17  Temp: 98 F (36.7 C)  SpO2: 98%     Physical Exam Vitals and nursing note reviewed.   Constitutional:      General: Awake and alert. No acute distress.    Appearance: Normal appearance. The patient is normal weight.      Able to speak in complete sentences without cough or dyspnea  HENT:     Head: Normocephalic and atraumatic.     Mouth: Mucous membranes are moist.  Eyes:     General: PERRL. Normal EOMs          Conjunctiva/sclera: Conjunctivae normal.  Nose No congestion/rhinorrhea  CV:                  Good peripheral perfusion.  Regular rate and rhythm  Resp:               Normal effort.  Equal breath sounds bilaterally.  Abd:                 No distention.  Soft, nontender.  No rebound or guarding.  Musculoskeletal:        General: No swelling. Normal range of motion.  Left knee: Presence of old scar from knee replacement . Skin is intact, no evidence of ecchymosis or hematomas.  No tender to palpation.  Full ROM limited by pain.  Sensation is intact.  Patient is able to move her toes Right knee: Normal Skin:    General: Skin is warm and dry.     Capillary Refill: Capillary refill takes less than 2 seconds.     Findings: No rash.  Neurological:     Mental Status: The patient is awake and alert. MAE spontaneously. No gross focal neurologic deficits are appreciated.  Psychiatric Mood and affect are normal. Speech and behavior are normal.  ED Results / Procedures / Treatments   Labs (all labs ordered are listed, but only abnormal results are displayed) Labs Reviewed - No data to display   EKG See physician read    RADIOLOGY I independently reviewed and interpreted imaging and agree with radiologists findings.      PROCEDURES:  Critical Care performed:   Procedures   MEDICATIONS ORDERED IN ED: Medications  traMADol  (ULTRAM ) tablet 50 mg (50 mg Oral Given 08/16/23 1943)   Clinical  Course as of 08/16/23 2141  Thu Aug 16, 2023  1915 DG Knee Complete 4 Views Left Knee replacement with acute slightly impacted fracture at the medial femoral condyle. Moderate knee effusion.   [AE]  1935 Consulted Dr. Lorelle, orthopedics on-call who recommended to have a knee CT to determine mine surgery.  Updated patient.  I will order tramadol  for pain.  Patient is agreeable with the plan [AE]  2011 Dr. Lorelle  reviewed the CT scan, patient does not need surgery, she needs a knee immobilizer.  Dr. Lorelle wants to see her in a week to repeat x-ray.  Patient is unable to bear weight due to pain.  I will consult hospitalist MD for admission for pain control.  Updated patient she is agreeable with the plan. [AE]  2017 Reassessed the patient, patient had tramadol , patient states she has pain when she moves.  [AE]  2022 CT Knee Left Wo Contrast Status post knee replacement with normal alignment. Artifact limits assessment of bony detail. Subtle cortical step-off at the medial metaphyseal femoral cortex above the prosthesis suspicious for slightly impacted nondisplaced fracture. Caudal extent of lucency difficult to evaluate due to extensive artifact from the hardware, fracture lucency may extend to prosthetic surface anteriorly on coronal views. 2. Moderate knee effusion with small hematocrit level.    [AE]    Clinical Course User Index [AE] Janit Kast, PA-C    IMPRESSION / MDM / ASSESSMENT AND PLAN / ED COURSE  I reviewed the triage vital signs and the nursing notes.  Differential diagnosis includes, but is not limited to, fracture, dislocation, soft tissue injury  Patient's presentation is most consistent with acute complicated illness / injury requiring diagnostic workup.    CHELE CORNELL is a 73 y.o., female who presents today after having a mechanical fall, unable to bear weight on her left knee.  Physical exam skin is intact, presence of old scar for left knee  replacement surgery, no ecchymoses or hematomas no tenderness to palpation, full ROM limited by pain.  X-ray showed slightly impacted fracture at the femoral condyle with moderate knee effusion.  Consulted Dr. Lorelle who recommended CT to determine management.  Dr.  Lorelle reviewed the knee CT, the patient does not need surgery, but knee immobilizer and pain control .Dr. Velinda wants to see the patient next week for  x-ray. Patient's diagnosis is consistent with knee replacement with slightly impacted fracture of the femoral condyle, moderate knee effusion. I independently reviewed and interpreted imaging and agree with radiologists findings.  I did review the patient's allergies and medications.During admission patient received tramadol .  Patient is going to be admitted for pain control.  Discussed plan of care with patient, answered all of patient's questions, Patient agreeable to plan of care.Patient verbalized understanding.     FINAL CLINICAL IMPRESSION(S) / ED DIAGNOSES   Final diagnoses:  Effusion of right knee  Acute pain of right knee     Rx / DC Orders   ED Discharge Orders     None        Note:  This document was prepared using Dragon voice recognition software and may include unintentional dictation errors.   Janit Kast, PA-C 08/16/23 2141    Janit Kast, PA-C 08/16/23 2312    Jacolyn Pae, MD 08/17/23 1504

## 2023-08-17 DIAGNOSIS — T84019A Broken internal joint prosthesis, unspecified site, initial encounter: Secondary | ICD-10-CM | POA: Diagnosis not present

## 2023-08-17 DIAGNOSIS — S72435A Nondisplaced fracture of medial condyle of left femur, initial encounter for closed fracture: Secondary | ICD-10-CM

## 2023-08-17 LAB — BASIC METABOLIC PANEL WITH GFR
Anion gap: 9 (ref 5–15)
BUN: 21 mg/dL (ref 8–23)
CO2: 21 mmol/L — ABNORMAL LOW (ref 22–32)
Calcium: 8.9 mg/dL (ref 8.9–10.3)
Chloride: 109 mmol/L (ref 98–111)
Creatinine, Ser: 1.03 mg/dL — ABNORMAL HIGH (ref 0.44–1.00)
GFR, Estimated: 57 mL/min — ABNORMAL LOW (ref >60)
Glucose, Bld: 86 mg/dL (ref 70–99)
Potassium: 4.4 mmol/L (ref 3.5–5.1)
Sodium: 139 mmol/L (ref 135–145)

## 2023-08-17 MED ORDER — ADULT MULTIVITAMIN W/MINERALS CH
1.0000 | ORAL_TABLET | Freq: Every day | ORAL | Status: DC
  Administered 2023-08-18 – 2023-08-24 (×7): 1 via ORAL
  Filled 2023-08-17 (×7): qty 1

## 2023-08-17 MED ORDER — TEMAZEPAM 7.5 MG PO CAPS
30.0000 mg | ORAL_CAPSULE | Freq: Every day | ORAL | Status: DC
  Administered 2023-08-17 – 2023-08-23 (×7): 30 mg via ORAL
  Filled 2023-08-17 (×7): qty 4

## 2023-08-17 MED ORDER — VITAMIN B-12 1000 MCG PO TABS
1000.0000 ug | ORAL_TABLET | Freq: Every day | ORAL | Status: DC
  Administered 2023-08-18 – 2023-08-24 (×7): 1000 ug via ORAL
  Filled 2023-08-17 (×7): qty 1

## 2023-08-17 MED ORDER — POTASSIUM CHLORIDE CRYS ER 20 MEQ PO TBCR
40.0000 meq | EXTENDED_RELEASE_TABLET | Freq: Once | ORAL | Status: AC
Start: 2023-08-17 — End: 2023-08-17
  Administered 2023-08-17: 40 meq via ORAL
  Filled 2023-08-17: qty 2

## 2023-08-17 MED ORDER — SODIUM CHLORIDE 0.9 % IV SOLN: INTRAVENOUS | Status: AC

## 2023-08-17 MED ORDER — PRIMIDONE 50 MG PO TABS
50.0000 mg | ORAL_TABLET | Freq: Two times a day (BID) | ORAL | Status: DC
  Administered 2023-08-17 – 2023-08-24 (×14): 50 mg via ORAL
  Filled 2023-08-17 (×14): qty 1

## 2023-08-17 MED ORDER — MONTELUKAST SODIUM 10 MG PO TABS
10.0000 mg | ORAL_TABLET | Freq: Every day | ORAL | Status: DC
  Administered 2023-08-17 – 2023-08-23 (×7): 10 mg via ORAL
  Filled 2023-08-17 (×7): qty 1

## 2023-08-17 MED ORDER — DILTIAZEM HCL ER COATED BEADS 180 MG PO CP24
180.0000 mg | ORAL_CAPSULE | Freq: Every day | ORAL | Status: DC
  Administered 2023-08-17 – 2023-08-24 (×8): 180 mg via ORAL
  Filled 2023-08-17 (×8): qty 1

## 2023-08-17 MED ORDER — ENSURE PLUS HIGH PROTEIN PO LIQD: 237.0000 mL | Freq: Two times a day (BID) | ORAL | Status: DC

## 2023-08-17 MED ORDER — MORPHINE SULFATE (PF) 2 MG/ML IV SOLN
0.5000 mg | INTRAVENOUS | Status: DC | PRN
  Administered 2023-08-17: 0.5 mg via INTRAVENOUS
  Filled 2023-08-17: qty 1

## 2023-08-17 MED ORDER — POLYVINYL ALCOHOL 1.4 % OP SOLN
2.0000 [drp] | Freq: Two times a day (BID) | OPHTHALMIC | Status: DC
  Administered 2023-08-17 – 2023-08-24 (×14): 2 [drp] via OPHTHALMIC
  Filled 2023-08-17: qty 15

## 2023-08-17 NOTE — Consult Note (Signed)
 ORTHOPAEDIC CONSULTATION  REQUESTING PHYSICIAN: Jens Durand, MD  Chief Complaint:   Left knee injury  History of Present Illness: Tonya Walton is a 73 y.o. female with a past medical history of atypical chest pain, nephrolithiasis, bronchitis, Sjogren syndrome, panlobular, emphysema, A-fib, UTI who presented to the emergency room after she stepped on some wet grass in the garden and slipped and fell on her left knee.  She was able to bear weight came into the emergency room for evaluation.  Patient denies any pre-existing left knee pain.  Reports she had her left knee replaced about 10 years ago and had been functioning well up to this point without any issues.  Denies any numbness tingling or difficulty moving the ankle or foot.  Past Medical History:  Diagnosis Date   Anxiety    Arthritis    Atrial fibrillation (HCC)    CHF (congestive heart failure) (HCC)    Chronic kidney disease    COPD (chronic obstructive pulmonary disease) (HCC)    Coronary artery disease    Depression    Family history of adverse reaction to anesthesia    brothers had a lung collaspe during an unknown procedure.   GERD (gastroesophageal reflux disease)    Headache    History of kidney stones    Hx of CABG 04/01/2015   Single vessel; LIMA-LAD   Hyperlipidemia    Hypertension    Hypothyroidism    Insomnia    Invasive ductal carcinoma of breast, female, right (HCC) 09/27/2010   Stage 1A; ER/PR (+) and HER2/neu indeterminant; pT1b pN0 (sn)   Neuromuscular disorder (HCC)    nerve damage in legs   Personal history of radiation therapy 2012   BREAST CA   Restless leg syndrome    Seizures (HCC)    Shortness of breath dyspnea    Sjogren's syndrome (HCC)    Stroke (HCC)    Some mild weakness in left side residual   Vitamin B12 deficiency    Past Surgical History:  Procedure Laterality Date   APPENDECTOMY     BREAST BIOPSY Left 07/2013    NEG   BREAST BIOPSY Right 08/22/2016   benign   BREAST BIOPSY Right 01/07/2019   asymmetry bx with stereo, coil marker,fat necrosis   BREAST BIOPSY Right 01/07/2019   asymm affirm bx,  x marker,  fat necrosis   BREAST BIOPSY Right 03/25/2020   stereo bx, ribbon clip, neg fat nec   BREAST EXCISIONAL BIOPSY Right 2012   POS   BREAST LUMPECTOMY  2012   Right breast   CARDIAC CATHETERIZATION N/A 03/02/2015   Procedure: Left Heart Cath and Coronary Angiography;  Surgeon: Vinie DELENA Jude, MD;  Location: [facility] INVASIVE CV LAB;  Service: Cardiovascular;  Laterality: N/A;   CATARACT EXTRACTION W/PHACO Right 10/14/2014   Procedure: CATARACT EXTRACTION PHACO AND INTRAOCULAR LENS PLACEMENT (IOC);  Surgeon: Dene Etienne, MD;  Location: [facility];  Service: Ophthalmology;  Laterality: Right;   CATARACT EXTRACTION W/PHACO Left 10/05/2021   Procedure: CATARACT EXTRACTION PHACO AND INTRAOCULAR LENS PLACEMENT (IOC) LEFT 7.38 00:52.6;  Surgeon: Etienne Dene, MD;  Location: [facility];  Service: Ophthalmology;  Laterality: Left;   COLONOSCOPY     COLONOSCOPY WITH PROPOFOL  N/A 02/17/2021   Procedure: COLONOSCOPY WITH PROPOFOL ;  Surgeon: Onita Elspeth Sharper, DO;  Location: [facility] ENDOSCOPY;  Service: Gastroenterology;  Laterality: N/A;   CORONARY ARTERY BYPASS GRAFT N/A 04/01/2015   Procedure: CORONARY ARTERY BYPASS GRAFTING (CABG) x one , using left mammary artery;  Surgeon: Maude Fleeta Ochoa, MD;  Location: [facility] OR;  Service: Open Heart Surgery;  Laterality: N/A;   CYSTOSCOPY/URETEROSCOPY/HOLMIUM LASER/STENT PLACEMENT Left 07/06/2020   Procedure: CYSTOSCOPY/URETEROSCOPY/STENT PLACEMENT/ retrograde pyelogram;  Surgeon: Twylla Glendia BROCKS, MD;  Location: [facility] ORS;  Service: Urology;  Laterality: Left;   CYSTOSCOPY/URETEROSCOPY/HOLMIUM LASER/STENT PLACEMENT Left 07/27/2020   Procedure: CYSTOSCOPY/URETEROSCOPY/HOLMIUM LASER/STENT EXCHANGE;  Surgeon: Twylla Glendia BROCKS, MD;  Location:  [facility] ORS;  Service: Urology;  Laterality: Left;   JOINT REPLACEMENT Right    Total Knee Replacement   TEE WITHOUT CARDIOVERSION N/A 04/01/2015   Procedure: TRANSESOPHAGEAL ECHOCARDIOGRAM (TEE);  Surgeon: Maude Fleeta Ochoa, MD;  Location[facility] OR;  Service: Open Heart Surgery;  Laterality: N/A;   TONSILLECTOMY     TOTAL KNEE ARTHROPLASTY Right 06/10/2014   Procedure: TOTAL KNEE ARTHROPLASTY;  Surgeon: Lynwood SHAUNNA Hue, MD;  Location: [facility] ORS;  Service: Orthopedics;  Laterality: Right;   TOTAL KNEE ARTHROPLASTY Left 09/02/2014   Procedure: TOTAL KNEE ARTHROPLASTY;  Surgeon: Lynwood SHAUNNA Hue, MD;  Location: [facility] ORS;  Service: Orthopedics;  Laterality: Left;   Social History   Socioeconomic History   Marital status: Married    Spouse name: Not on file   Number of children: Not on file   Years of education: Not on file   Highest education level: Not on file  Occupational History   Not on file  Tobacco Use   Smoking status: Former    Current packs/day: 0.00    Types: Cigarettes    Quit date: 04/30/1992    Years since quitting: 31.3   Smokeless tobacco: Never  Vaping Use   Vaping status: Never Used  Substance and Sexual Activity   Alcohol  use: No   Drug use: No   Sexual activity: Yes    Partners: Male    Birth control/protection: None  Other Topics Concern   Not on file  Social History Narrative   Not on file   Social Drivers of Health   Financial Resource Strain: Low Risk  (08/01/2022)   Received from [facility]   Overall Financial Resource Strain (CARDIA)    Difficulty of Paying Living Expenses: Not hard at all  Food Insecurity: No Food Insecurity (08/01/2022)   Received from [facility]   Hunger Vital Sign    Within the past 12 months, you worried that your food would run out before you got the money to buy more.: Never true    Within the past 12 months, the food you bought just didn't last and you didn't have money to get more.: Never true   Transportation Needs: No Transportation Needs (08/01/2022)   Received from [facility] - Transportation    In the past 12 months, has lack of transportation kept you from medical appointments or from getting medications?: No    Lack of Transportation (Non-Medical): No  Physical Activity: Not on file  Stress: Not on file  Social Connections: Not on file   Family History  Problem Relation Age of Onset   Leukemia Mother    Bone cancer Father    Heart disease Brother    Heart disease Brother    Diabetes Brother    Hypertension Brother    Diabetes Brother    Hypertension Brother    Heart disease Brother    Breast cancer Paternal Aunt    No Known Allergies Prior to Admission medications   Medication Sig Start Date End Date Taking? Authorizing Provider  allopurinol  (ZYLOPRIM )  100 MG tablet Take 100 mg by mouth 2 (two) times daily. 08/12/20  Yes [provider]  atorvastatin  (LIPITOR ) 80 MG tablet Take 80 mg by mouth every morning. 06/13/20  Yes [provider]  carboxymethylcellul-glycerin  (REFRESH OPTIVE) 0.5-0.9 % ophthalmic solution Place 2 drops into both eyes 2 (two) times daily.   Yes [provider]  Cholecalciferol (VITAMIN D3) 2000 units capsule Take 2,000 Units by mouth daily.    Yes [provider]  colchicine 0.6 MG tablet Take 0.6 mg by mouth 2 (two) times daily as needed (gout). 01/02/20  Yes [provider]  diltiazem  (CARDIZEM  CD) 180 MG 24 hr capsule Take 180 mg by mouth daily.   Yes [provider]  donepezil  (ARICEPT ) 10 MG tablet Take 10 mg by mouth at bedtime.   Yes [provider]  FLUoxetine  (PROZAC ) 40 MG capsule Take 40 mg by mouth daily.   Yes [provider]  hydroxychloroquine  (PLAQUENIL ) 200 MG tablet Take 200 mg by mouth 2 (two) times daily. 05/31/20  Yes [provider]  lamoTRIgine  (LAMICTAL ) 25 MG tablet TAKE 2 TABLETS BY MOUTH EVERY DAY Patient taking  differently: Take 75 mg by mouth 2 (two) times daily. 07/22/18  Yes Mohammed Goldstein, MD  levothyroxine  (SYNTHROID , LEVOTHROID) 25 MCG tablet Take 25 mcg by mouth daily before breakfast.   Yes [provider]  meloxicam (MOBIC) 7.5 MG tablet Take 7.5 mg by mouth at bedtime.   Yes [provider]  montelukast  (SINGULAIR ) 10 MG tablet Take 10 mg by mouth at bedtime.   Yes [provider]  nitroGLYCERIN  (NITROSTAT ) 0.4 MG SL tablet Place 0.4 mg under the tongue every 5 (five) minutes as needed for chest pain. 04/26/21  Yes [provider]  ondansetron  (ZOFRAN ) 4 MG tablet Take 1 tablet (4 mg total) by mouth every 8 (eight) hours as needed. 06/05/23 06/04/24 Yes Malvina Alm DASEN, MD  OVER THE COUNTER MEDICATION Take 2 each by mouth daily. D-MANNOSE WITH CRANBERRY   Yes [provider]  primidone (MYSOLINE) 50 MG tablet Take 50 mg by mouth 2 (two) times daily. 08/15/23 08/14/24 Yes [provider]  temazepam (RESTORIL) 15 MG capsule Take 30 mg by mouth at bedtime.   Yes [provider]  vitamin B-12 (CYANOCOBALAMIN ) 1000 MCG tablet Take 1,000 mcg by mouth daily.   Yes [provider]  Xylitol (XYLIMELTS MT) Use as directed 1 each in the mouth or throat 2 (two) times daily in the am and at bedtime..   Yes [provider]  zolpidem  (AMBIEN ) 5 MG tablet Take 5 mg by mouth at bedtime. 02/02/17  Yes [provider]   CT Knee Left Wo Contrast Result Date: 08/16/2023 CLINICAL DATA:  Mechanical fall EXAM: CT OF THE LEFT KNEE WITHOUT CONTRAST TECHNIQUE: Multidetector CT imaging of the left knee was performed according to the standard protocol. Multiplanar CT image reconstructions were also generated. RADIATION DOSE REDUCTION: This exam was performed according to the departmental dose-optimization program which includes automated exposure control, adjustment of the mA and/or kV according to patient size and/or use of iterative reconstruction  technique. COMPARISON:  Radiographs 08/16/2023 FINDINGS: Bones/Joint/Cartilage Moderate knee effusion, small hematocrit level on series 3, image 43. Status post knee replacement with normal alignment. Artifact limits assessment of bony detail. Subtle cortical step-off at the medial metaphyseal femoral cortex, coronal series 9, image 62 through 65 with subtle anterior to posterior lucency on axial series 5 image 61. Caudal extent of lucency difficult  to evaluate due to extensive artifact from the hardware, lucency may extend to the prosthesis anteriorly, coronal series 9, image 51. No other discrete fractures are visualized. Ligaments Suboptimally assessed by CT. Muscles and Tendons No intramuscular fluid collection. No significant atrophy normal contour of quadriceps and patellar tendons Soft tissues Negative IMPRESSION: 1. Status post knee replacement with normal alignment. Artifact limits assessment of bony detail. Subtle cortical step-off at the medial metaphyseal femoral cortex above the prosthesis suspicious for slightly impacted nondisplaced fracture. Caudal extent of lucency difficult to evaluate due to extensive artifact from the hardware, fracture lucency may extend to prosthetic surface anteriorly on coronal views. 2. Moderate knee effusion with small hematocrit level. Electronically Signed   By: Luke Bun M.D.   On: 08/16/2023 20:17   DG Knee Complete 4 Views Left Result Date: 08/16/2023 CLINICAL DATA:  Fall EXAM: LEFT KNEE - COMPLETE 4+ VIEW COMPARISON:  09/02/2014 FINDINGS: Knee replacement with normal alignment. Acute slightly impacted fracture at the medial femoral condyle on oblique view. Moderate knee effusion. IMPRESSION: Knee replacement with acute slightly impacted fracture at the medial femoral condyle. Moderate knee effusion. Electronically Signed   By: Luke Bun M.D.   On: 08/16/2023 18:56    Positive ROS: All other systems have been reviewed and were otherwise negative with the  exception of those mentioned in the HPI and as above.  Physical Exam: General:  Alert, no acute distress Psychiatric:  Patient is competent for consent with normal mood and affect   Cardiovascular:  No pedal edema Respiratory:  No wheezing, non-labored breathing GI:  Abdomen is soft and non-tender Skin:  No lesions in the area of chief complaint Neurologic:  Sensation intact distally Lymphatic:  No axillary or cervical lymphadenopathy  Orthopedic Exam:  Left lower extremity Skin intact with a healed midline incision over the knee No erythema or swelling Tender to palpation over the medial femoral condyle No tenderness over the lateral femoral condyle or anterior femur or anywhere on the proximal tibia Compartments all soft Mild medial opening with gentle valgus stress and reproduction of pain No significant pain with very mild varus stress Neurovasc intact distally intact dorsalis pedis pulse brisk capillary refill Able to dorsiflex and plantarflex the foot and toes  Knee placed in well-padded knee immobilizer  X-rays:  X-rays and CT scan images and report reviewed by myself.  There is a nondisplaced fracture line of the medial femoral condyle around the total knee replacement which is cemented.  No evidence of fracture line extension or any motion of the components or hardware.  Agree with radiology interpretation.  Assessment: Left periprosthetic distal femur medial femoral condyle fracture  Plan: I reviewed the clinical and radiographic findings with the patient.  This appears to be a small either impaction or avulsion style fracture of the medial femoral condyle around a prior total knee replacement.  The replacement appears stable on exam and imaging.  We discussed initial treatment for a few weeks nonweightbearing in a knee immobilizer to allow initial healing of the condyle.  We would then get repeat x-rays in the office to further assess fracture healing and decide on  weightbearing going forward.  All this was discussed with the patient she agrees the above plan nonweightbearing knee immobilizer left lower extremity use of assistive devices for ambulation.  Follow-up [facility] 10 to 14 days for repeat x-rays and evaluation.    Arthea Sheer MD  Beeper #:  [phone]  08/17/2023 8:53 AM

## 2023-08-17 NOTE — Evaluation (Signed)
 Occupational Therapy Evaluation Patient Details Name: Tonya Walton MRN: [id number] DOB: 27-Jan-1951 Today's Date: 08/17/2023   History of Present Illness   Pt is a 73 year old female presented to the ED after a fall, admitted for pain control with imaging showing a Left periprosthetic distal femur medial femoral condyle fracture      PMH significant for atrial fibrillation, coronary disease status post CABG, atrial fibrillation on anticoagulation, history of bilateral knee replacement     Clinical Impressions Chart reviewed to date, pt greeted in bed, agreeable to OT evaluation. KI donned throughout. PTA pt is MODI-I in ADL/IADL, amb with no AD. Pt presents with deficits in strength, endurance, activity tolerance, balance, affecting safe and optimal ADL completion. Please see further details below. Pt is performing ADL/functional mobility below PLOF, will benefit from skilled OT to address functional deficits and to facilitate return to PLOF. PT is left in bedside chair, all needs met. OT will follow acutely.     If plan is discharge home, recommend the following:   A lot of help with walking and/or transfers;A lot of help with bathing/dressing/bathroom;Help with stairs or ramp for entrance;Assistance with cooking/housework;Assist for transportation     Functional Status Assessment   Patient has had a recent decline in their functional status and demonstrates the ability to make significant improvements in function in a reasonable and predictable amount of time.     Equipment Recommendations   Other (comment) (defer to next venue of care)     Recommendations for Other Services         Precautions/Restrictions   Precautions Precautions: Fall Recall of Precautions/Restrictions: Impaired Required Braces or Orthoses: Knee Immobilizer - Left Knee Immobilizer - Left: On when out of bed or walking Restrictions Weight Bearing Restrictions Per Provider Order: Yes LLE Weight  Bearing Per Provider Order: Non weight bearing Other Position/Activity Restrictions: in KI     Mobility Bed Mobility Overal bed mobility: Needs Assistance Bed Mobility: Supine to Sit     Supine to sit: Max assist, HOB elevated, Used rails     General bed mobility comments: frequent vcs for technique    Transfers Overall transfer level: Needs assistance Equipment used: Rolling walker (2 wheels) Transfers: Sit to/from Stand Sit to Stand: Min assist, Mod assist                  Balance Overall balance assessment: Needs assistance Sitting-balance support: Single extremity supported Sitting balance-Leahy Scale: Fair     Standing balance support: Bilateral upper extremity supported, During functional activity, Reliant on assistive device for balance Standing balance-Leahy Scale: Poor                             ADL either performed or assessed with clinical judgement   ADL Overall ADL's : Needs assistance/impaired Eating/Feeding: Set up;Sitting   Grooming: Set up;Sitting               Lower Body Dressing: Maximal assistance Lower Body Dressing Details (indicate cue type and reason): socks, Adjust KI Toilet Transfer: Moderate assistance;Rolling walker (2 wheels);Stand-pivot Statistician Details (indicate cue type and reason): simulated to bedside chair Toileting- Clothing Manipulation and Hygiene: Maximal assistance               Vision Patient Visual Report: No change from baseline       Perception         Praxis  Pertinent Vitals/Pain Pain Assessment Pain Assessment: 0-10 Pain Score: 7  Pain Location: LLE Pain Descriptors / Indicators: Discomfort Pain Intervention(s): Monitored during session, Repositioned     Extremity/Trunk Assessment Upper Extremity Assessment Upper Extremity Assessment: Generalized weakness;RUE deficits/detail;LUE deficits/detail RUE Deficits / Details: bilateral tremor noted, she reports she  has a tremor at baseline, it is worse than usual   Lower Extremity Assessment Lower Extremity Assessment: Generalized weakness;LLE deficits/detail LLE Deficits / Details: in KI throughout       Communication Communication Communication: No apparent difficulties   Cognition Arousal: Alert Behavior During Therapy: WFL for tasks assessed/performed Cognition: No apparent impairments                               Following commands: Intact       Cueing  General Comments   Cueing Techniques: Verbal cues  vss throughout   Exercises Other Exercises Other Exercises: edu re: role of OT, role of rehab, discharge recommendations   Shoulder Instructions      Home Living Family/patient expects to be discharged to:: Private residence Living Arrangements: Spouse/significant other;Other relatives (spouse had a stroke so she is his caregiver, nephew/neice are in town and can check in) Available Help at Discharge: Family Type of Home: House Home Access: Stairs to enter (one step up) Entrance Stairs-Number of Steps: 1 Entrance Stairs-Rails: Right Home Layout: One level     Bathroom Shower/Tub: Tub/shower unit         Home Equipment: Rollator (4 wheels);Cane - single point;BSC/3in1;Shower seat          Prior Functioning/Environment Prior Level of Function : Independent/Modified Independent;Driving;History of Falls (last six months)             Mobility Comments: amb with no AD, 3 falls in the last 6 months ADLs Comments: MOD I-I with ADL/IADL, PRN assist for IADL (lifting heavy things from grocery store etC)    OT Problem List: Decreased activity tolerance;Decreased knowledge of use of DME or AE;Decreased strength;Impaired balance (sitting and/or standing)   OT Treatment/Interventions: Self-care/ADL training;Therapeutic exercise;Patient/family education;Balance training;Energy conservation;Therapeutic activities;DME and/or AE instruction      OT  Goals(Current goals can be found in the care plan section)   Acute Rehab OT Goals Patient Stated Goal: improve function OT Goal Formulation: With patient Time For Goal Achievement: 08/31/23 Potential to Achieve Goals: Good ADL Goals Pt Will Perform Grooming: with modified independence;sitting Pt Will Perform Lower Body Dressing: with modified independence;sitting/lateral leans;sit to/from stand Pt Will Transfer to Toilet: with modified independence Pt Will Perform Toileting - Clothing Manipulation and hygiene: with modified independence;sitting/lateral leans;sit to/from stand   OT Frequency:  Min 2X/week    Co-evaluation              AM-PAC OT 6 Clicks Daily Activity     Outcome Measure Help from another person eating meals?: None Help from another person taking care of personal grooming?: None Help from another person toileting, which includes using toliet, bedpan, or urinal?: A Lot Help from another person bathing (including washing, rinsing, drying)?: A Lot Help from another person to put on and taking off regular upper body clothing?: A Little Help from another person to put on and taking off regular lower body clothing?: A Lot 6 Click Score: 17   End of Session Equipment Utilized During Treatment: Rolling walker (2 wheels) Nurse Communication: Mobility status  Activity Tolerance: Patient tolerated treatment well Patient left: in  chair;with call bell/phone within reach;with chair alarm set  OT Visit Diagnosis: Other abnormalities of gait and mobility (R26.89)                Time: 1142-1200 OT Time Calculation (min): 18 min Charges:  OT General Charges $OT Visit: 1 Visit OT Evaluation $OT Eval Moderate Complexity: 1 Mod  Therisa Sheffield, OTD OTR/L  08/17/23, 12:54 PM

## 2023-08-17 NOTE — Progress Notes (Addendum)
 Progress Note    Tonya Walton  FMW:[id number] DOB: Mar 17, 1950  DOA: 08/16/2023 PCP: Cleotilde Oneil FALCON, MD      Brief Narrative:    Medical records reviewed and are as summarized below:  Tonya Walton is a 73 y.o. female  with medical history significant of atrial fibrillation, coronary disease status post CABG, atrial fibrillation on anticoagulation, history of bilateral knee replacement.  Patient had been in her usual state of health and was walking in her backyard when she slipped on a watery surface and fell.  She developed sudden onset of left knee joint pain.  Pain was so severe and she has been unable to ambulate on her feet since event.  Patient was brought into the emergency room to be further assessed.   Initial x-ray did reveal impacted fracture of the left medial condyle.  CT scan was requested by orthopedics with findings as noted below  Status post knee replacement with normal alignment. Subtle cortical step-off at the medial metaphyseal femoral cortex above the prosthesis suspicious for slightly impacted nondisplaced fracture. 2. Moderate knee effusion with small hematocrit level.   Due to significant pain, patient is unable to ambulate.  Patient was referred to hospitalist service for optimization of pain management and possible assessment for acute rehab placement.      Assessment/Plan:   Principal Problem:   Fracture of knee prosthesis, initial encounter (HCC) Active Problems:   Closed nondisplaced fracture of medial condyle of left femur (HCC)   Nutrition Problem: Increased nutrient needs Etiology: acute illness  Signs/Symptoms: estimated needs   Body mass index is 36.32 kg/m.  (Class II obesity)   Closed impacted left medial femoral condyle fracture: Analgesics as needed for pain.  She was evaluated by Dr. Stacy, orthopedic surgeon.  Conservative management and nonweightbearing in the knee immobilizer were recommended.  Outpatient follow-up in  10 to 14 days for repeat x-rays and further evaluation.  Consult PT and OT.   Probable AKI: Creatinine is 1.4.  Continue IV fluids.  Repeat BMP. Creatinine was 0.94 in May 2025. Hypokalemia: Replete potassium and monitor levels   CAD s/p CABG, paroxysmal atrial fibrillation: Continue diltiazem , Eliquis , aspirin  and Lipitor    COPD: stable.  Continue bronchodilators   Comorbidities include Sjogren's syndrome on Plaquenil , gout on allopurinol , depression, mild to moderate dementia    Diet Order             Diet Heart Fluid consistency: Thin  Diet effective now                            Consultants: Orthopedic surgeon  Procedures: None    Medications:    allopurinol   100 mg Oral BID   apixaban   5 mg Oral BID   aspirin  EC  81 mg Oral Daily   atorvastatin   80 mg Oral BH-q7a   diltiazem   180 mg Oral Daily   donepezil   5 mg Oral QHS   [START ON 08/18/2023] feeding supplement  237 mL Oral BID BM   FLUoxetine   40 mg Oral Daily   hydroxychloroquine   200 mg Oral BID   lamoTRIgine   75 mg Oral BID   levothyroxine   25 mcg Oral Q0600   mirabegron  ER  25 mg Oral Daily   [START ON 08/18/2023] multivitamin with minerals  1 tablet Oral Daily   pantoprazole   40 mg Oral Daily   zolpidem   5 mg Oral QHS   Continuous  Infusions:  sodium chloride  75 mL/hr at 08/17/23 0544     Anti-infectives (From admission, onward)    Start     Dose/Rate Route Frequency Ordered Stop   08/17/23 1000  hydroxychloroquine  (PLAQUENIL ) tablet 200 mg        200 mg Oral 2 times daily 08/16/23 2154                Family Communication/Anticipated D/C date and plan/Code Status   DVT prophylaxis: SCDs Start: 08/16/23 2152 apixaban  (ELIQUIS ) tablet 5 mg     Code Status: Full Code  Family Communication: None Disposition Plan: May need SNF at discharge   Status is: Inpatient Remains inpatient appropriate because: Left femoral condyle fracture       Subjective:    Interval events noted.  She complains of severe pain in the left knee.  She is requiring IV morphine  for pain control.  Objective:    Vitals:   08/17/23 0550 08/17/23 0830 08/17/23 0900 08/17/23 1044  BP: (!) 146/69 (!) 150/62 (!) 156/84 (!) 156/59  Pulse: 64   72  Resp: 17   16  Temp: 98.6 F (37 C)   98 F (36.7 C)  TempSrc: Oral   Oral  SpO2: 95%   95%  Weight:      Height:       No data found.  No intake or output data in the 24 hours ending 08/17/23 1203 Filed Weights   08/16/23 1814  Weight: 102.1 kg    Exam:  GEN: NAD SKIN: Warm and dry EYES: No pallor or icterus ENT: MMM CV: RRR PULM: CTA B ABD: soft, obese, NT, +BS CNS: AAO x 3, non focal EXT: Left knee immobilizer in place        Data Reviewed:   I have personally reviewed following labs and imaging studies:  Labs: Labs show the following:   Basic Metabolic Panel: Recent Labs  Lab 08/16/23 2233  NA 136  K 3.4*  CL 104  CO2 21*  GLUCOSE 91  BUN 33*  CREATININE 1.40*  CALCIUM  8.8*   GFR Estimated Creatinine Clearance: 42.5 mL/min (A) (by C-G formula based on SCr of 1.4 mg/dL (H)). Liver Function Tests: No results for input(s): AST, ALT, ALKPHOS, BILITOT, PROT, ALBUMIN  in the last 168 hours. No results for input(s): LIPASE, AMYLASE in the last 168 hours. No results for input(s): AMMONIA in the last 168 hours. Coagulation profile No results for input(s): INR, PROTIME in the last 168 hours.  CBC: Recent Labs  Lab 08/16/23 2233  WBC 8.5  NEUTROABS 5.6  HGB 10.6*  HCT 32.5*  MCV 93.7  PLT 240   Cardiac Enzymes: No results for input(s): CKTOTAL, CKMB, CKMBINDEX, TROPONINI in the last 168 hours. BNP (last 3 results) No results for input(s): PROBNP in the last 8760 hours. CBG: No results for input(s): GLUCAP in the last 168 hours. D-Dimer: No results for input(s): DDIMER in the last 72 hours. Hgb A1c: No results for input(s): HGBA1C  in the last 72 hours. Lipid Profile: No results for input(s): CHOL, HDL, LDLCALC, TRIG, CHOLHDL, LDLDIRECT in the last 72 hours. Thyroid  function studies: No results for input(s): TSH, T4TOTAL, T3FREE, THYROIDAB in the last 72 hours.  Invalid input(s): FREET3 Anemia work up: No results for input(s): VITAMINB12, FOLATE, FERRITIN, TIBC, IRON, RETICCTPCT in the last 72 hours. Sepsis Labs: Recent Labs  Lab 08/16/23 2233  WBC 8.5    Microbiology No results found for this or any previous visit (from  the past 240 hours).  Procedures and diagnostic studies:  CT Knee Left Wo Contrast Result Date: 08/16/2023 CLINICAL DATA:  Mechanical fall EXAM: CT OF THE LEFT KNEE WITHOUT CONTRAST TECHNIQUE: Multidetector CT imaging of the left knee was performed according to the standard protocol. Multiplanar CT image reconstructions were also generated. RADIATION DOSE REDUCTION: This exam was performed according to the departmental dose-optimization program which includes automated exposure control, adjustment of the mA and/or kV according to patient size and/or use of iterative reconstruction technique. COMPARISON:  Radiographs 08/16/2023 FINDINGS: Bones/Joint/Cartilage Moderate knee effusion, small hematocrit level on series 3, image 43. Status post knee replacement with normal alignment. Artifact limits assessment of bony detail. Subtle cortical step-off at the medial metaphyseal femoral cortex, coronal series 9, image 62 through 65 with subtle anterior to posterior lucency on axial series 5 image 61. Caudal extent of lucency difficult to evaluate due to extensive artifact from the hardware, lucency may extend to the prosthesis anteriorly, coronal series 9, image 51. No other discrete fractures are visualized. Ligaments Suboptimally assessed by CT. Muscles and Tendons No intramuscular fluid collection. No significant atrophy normal contour of quadriceps and patellar tendons Soft  tissues Negative IMPRESSION: 1. Status post knee replacement with normal alignment. Artifact limits assessment of bony detail. Subtle cortical step-off at the medial metaphyseal femoral cortex above the prosthesis suspicious for slightly impacted nondisplaced fracture. Caudal extent of lucency difficult to evaluate due to extensive artifact from the hardware, fracture lucency may extend to prosthetic surface anteriorly on coronal views. 2. Moderate knee effusion with small hematocrit level. Electronically Signed   By: Luke Bun M.D.   On: 08/16/2023 20:17   DG Knee Complete 4 Views Left Result Date: 08/16/2023 CLINICAL DATA:  Fall EXAM: LEFT KNEE - COMPLETE 4+ VIEW COMPARISON:  09/02/2014 FINDINGS: Knee replacement with normal alignment. Acute slightly impacted fracture at the medial femoral condyle on oblique view. Moderate knee effusion. IMPRESSION: Knee replacement with acute slightly impacted fracture at the medial femoral condyle. Moderate knee effusion. Electronically Signed   By: Luke Bun M.D.   On: 08/16/2023 18:56               LOS: 1 day   Griffith Santilli  [facility]   Pager on [email or web address]. If 7PM-7AM, please contact night-coverage at [email or web address]     08/17/2023, 12:03 PM

## 2023-08-17 NOTE — Evaluation (Addendum)
 Physical Therapy Evaluation Patient Details Name: Tonya Walton MRN: [id number] DOB: 12/16/1950 Today's Date: 08/17/2023  History of Present Illness  Pt is a 73 year old female presented to the ED after a fall, admitted for pain control with imaging showing a Left periprosthetic distal femur medial femoral condyle fracture      PMH significant for atrial fibrillation, coronary disease status post CABG, atrial fibrillation on anticoagulation, history of bilateral knee replacement  Clinical Impression  Pt was pleasant and motivated to participate during the session and put forth fair effort throughout. Pt required +2 physical assistance to complete STS from chair with RW. Pt able to minimally shift R foot forwards, backwards and laterally across floor in stance with +2 physical assistance, but unable to clear RLE while NWB on LLE at this time. Pt is currently not at baseline level of function, as pt requires +2 physical assistance to perform STS and is unable to ambulate. Pt reported no adverse symptoms during the session other than LLE pain with SpO2 and HR WNL throughout on room air. Pt will benefit from continued PT services upon discharge to safely address deficits listed in patient problem list for decreased caregiver assistance and eventual return to PLOF.          If plan is discharge home, recommend the following: Two people to help with walking and/or transfers;Two people to help with bathing/dressing/bathroom;Assistance with cooking/housework;Assist for transportation;Help with stairs or ramp for entrance   Can travel by private vehicle   No    Equipment Recommendations Other (comment) (TBD at next venue of care)  Recommendations for Other Services       Functional Status Assessment Patient has had a recent decline in their functional status and demonstrates the ability to make significant improvements in function in a reasonable and predictable amount of time.     Precautions /  Restrictions Precautions Precautions: Fall Recall of Precautions/Restrictions: Impaired Required Braces or Orthoses: Knee Immobilizer - Left Knee Immobilizer - Left: On when out of bed or walking Restrictions Weight Bearing Restrictions Per Provider Order: Yes LLE Weight Bearing Per Provider Order: Non weight bearing Other Position/Activity Restrictions: in KI      Mobility  Bed Mobility               General bed mobility comments: Not assessed; pt in chair pre/post session    Transfers Overall transfer level: Needs assistance Equipment used: Rolling walker (2 wheels) Transfers: Sit to/from Stand Sit to Stand: Min assist, Mod assist, +2 physical assistance           General transfer comment: Pt required +2 modA to complete STS from chair with RW. Pt demonstrated difficulty coming to standing while NWB on LLE without physical assistance. KI on LLE was donned throughout.     Ambulation/Gait               General Gait Details: No true ambulation occured, but pt was able to shift R foot forwards, backwards, and laterally with +2 minA. Pt unable to hop with RW at this time for ambulation, but was able to maintain NWB on LLE once upright.   Stairs            Wheelchair Mobility     Tilt Bed    Modified Rankin (Stroke Patients Only)       Balance Overall balance assessment: Needs assistance Sitting-balance support: Feet supported, No upper extremity supported Sitting balance-Leahy Scale: Good     Standing balance support:  Bilateral upper extremity supported, During functional activity, Reliant on assistive device for balance Standing balance-Leahy Scale: Poor Standing balance comment: Pt requires +2 physical assistance to maintain balance in stance with RW while NWB on LLE                             Pertinent Vitals/Pain Pain Assessment Pain Assessment: 0-10 Pain Score: 3  Pain Location: LLE Pain Descriptors / Indicators:  Discomfort, pt reported increased pain when coming to standing  Pain Intervention(s): Monitored during session, Premedicated before session    Home Living Family/patient expects to be discharged to:: Private residence Living Arrangements: Spouse/significant other Available Help at Discharge: Family;Available 24 hours/day Type of Home: House Home Access: Stairs to enter Entrance Stairs-Rails: Right Entrance Stairs-Number of Steps: 1   Home Layout: One level Home Equipment: Rollator (4 wheels);Cane - single point;BSC/3in1;Shower seat      Prior Function Prior Level of Function : Independent/Modified Independent;History of Falls (last six months)             Mobility Comments: Pt reported being independent with community ambulationwithout use of AD. Pt reported 3 falls in the last 6 months, occuring due to LOB on uneven surfaces. ADLs Comments: Pt reported being independent with ADLs without use of AD.     Extremity/Trunk Assessment   Upper Extremity Assessment Upper Extremity Assessment: Defer to OT evaluation     Lower Extremity Assessment Lower Extremity Assessment: LLE deficits/detail LLE Deficits / Details: in KI throughout LLE: Unable to fully assess due to pain       Communication   Communication Communication: No apparent difficulties    Cognition Arousal: Alert Behavior During Therapy: WFL for tasks assessed/performed                             Following commands: Intact       Cueing Cueing Techniques: Verbal cues     General Comments General comments (skin integrity, edema, etc.): vss throughout    Exercises     Assessment/Plan    PT Assessment Patient needs continued PT services  PT Problem List Decreased strength;Decreased mobility;Decreased range of motion;Decreased activity tolerance;Decreased balance;Pain       PT Treatment Interventions DME instruction;Therapeutic exercise;Gait training;Balance training;Stair  training;Functional mobility training;Therapeutic activities;Patient/family education    PT Goals (Current goals can be found in the Care Plan section)  Acute Rehab PT Goals Patient Stated Goal: to return to daily routine independently PT Goal Formulation: With patient Time For Goal Achievement: 08/30/23 Potential to Achieve Goals: Good    Frequency Min 2X/week     Co-evaluation               AM-PAC PT 6 Clicks Mobility  Outcome Measure Help needed turning from your back to your side while in a flat bed without using bedrails?: A Lot Help needed moving from lying on your back to sitting on the side of a flat bed without using bedrails?: A Lot Help needed moving to and from a bed to a chair (including a wheelchair)?: A Lot Help needed standing up from a chair using your arms (e.g., wheelchair or bedside chair)?: A Lot Help needed to walk in hospital room?: Total Help needed climbing 3-5 steps with a railing? : Total 6 Click Score: 10    End of Session Equipment Utilized During Treatment: Gait belt Activity Tolerance: Patient limited by pain Patient  left: in chair;with call bell/phone within reach;with chair alarm set Nurse Communication: Mobility status PT Visit Diagnosis: Unsteadiness on feet (R26.81);Muscle weakness (generalized) (M62.81);Difficulty in walking, not elsewhere classified (R26.2);Pain Pain - Right/Left: Left Pain - part of body: Knee    Time: 8668-8643 PT Time Calculation (min) (ACUTE ONLY): 25 min   Charges:                 Leontine Ingles, SPT 08/17/23, 2:39 PM This entire session was performed under direct supervision and direction of a licensed therapist/therapist assistant. I have personally read, edited and approve of the note as written.  Carmin Deed, DPT

## 2023-08-17 NOTE — Progress Notes (Signed)
 Initial Nutrition Assessment  DOCUMENTATION CODES:   Obesity unspecified  INTERVENTION:   -Once diet is advanced, add:   -Ensure Plus High Protein po BID, each supplement provides 350 kcal and 20 grams of protein  -MVI with minerals daily  NUTRITION DIAGNOSIS:   Increased nutrient needs related to acute illness as evidenced by estimated needs.  GOAL:   Patient will meet greater than or equal to 90% of their needs   MONITOR:   PO intake, Supplement acceptance, Diet advancement  REASON FOR ASSESSMENT:   Consult Assessment of nutrition requirement/status, Hip fracture protocol  ASSESSMENT:   Pt with medical history significant of atrial fibrillation, coronary disease status post CABG, atrial fibrillation on anticoagulation, history of bilateral knee replacement.  Patient had been in her usual state of health and was walking in her backyard today when she slipped on a watery surface and fell.  She developed sudden onset of left knee joint pain.  Pain was so severe and she has been unable to ambulate on her feet since event.  Pt admitted with closed impacted left medial femoral condyle fracture s/p fall.   Pt unavailable at time of visit.  RD unable to obtain further nutrition-related history or complete nutrition-focused physical exam at this time.    Per H&P, orthopedics plan for no surgical interventions; lower extremity to be immobilized.   Reviewed wt hx; pt has experienced a 2.9% wt loss over the past 2 months, which is not significant for time frame.   Medications reviewed and include cardizem , prozac , synthroid , protonix , potassium chloride , and 0.9% sodium chloride  infusion @ 75 ml/hr.   Labs reviewed: CBGS: 98, K: 3.4.    Diet Order:   Diet Order     None       EDUCATION NEEDS:   No education needs have been identified at this time  Skin:  Skin Assessment: Reviewed RN Assessment  Last BM:  Unknown  Height:   Ht Readings from Last 1 Encounters:   08/16/23 5' 6 (1.676 m)    Weight:   Wt Readings from Last 1 Encounters:  08/16/23 102.1 kg    Ideal Body Weight:  59.1 kg  BMI:  Body mass index is 36.32 kg/m.  Estimated Nutritional Needs:   Kcal:  1700-1900  Protein:  90-105 grams  Fluid:  1.7-1.9 L    Margery ORN, RD, LDN, CDCES Registered Dietitian III Certified Diabetes Care and Education Specialist If unable to reach this RD, please use RD Inpatient group chat on secure chat between hours of 8am-4 pm daily

## 2023-08-18 DIAGNOSIS — T84019A Broken internal joint prosthesis, unspecified site, initial encounter: Secondary | ICD-10-CM | POA: Diagnosis not present

## 2023-08-18 LAB — BASIC METABOLIC PANEL WITH GFR
Anion gap: 9 (ref 5–15)
BUN: 19 mg/dL (ref 8–23)
CO2: 21 mmol/L — ABNORMAL LOW (ref 22–32)
Calcium: 8.7 mg/dL — ABNORMAL LOW (ref 8.9–10.3)
Chloride: 109 mmol/L (ref 98–111)
Creatinine, Ser: 1.02 mg/dL — ABNORMAL HIGH (ref 0.44–1.00)
GFR, Estimated: 58 mL/min — ABNORMAL LOW (ref >60)
Glucose, Bld: 99 mg/dL (ref 70–99)
Potassium: 3.7 mmol/L (ref 3.5–5.1)
Sodium: 139 mmol/L (ref 135–145)

## 2023-08-18 NOTE — Plan of Care (Signed)

## 2023-08-18 NOTE — Progress Notes (Signed)
 Progress Note    Tonya Walton  FMW:[id number] DOB: 04/24/1950  DOA: 08/16/2023 PCP: Cleotilde Oneil FALCON, MD      Brief Narrative:    Medical records reviewed and are as summarized below:  Tonya Walton is a 73 y.o. female  with medical history significant of atrial fibrillation, coronary disease status post CABG, atrial fibrillation on anticoagulation, history of bilateral knee replacement.  Patient had been in her usual state of health and was walking in her backyard when she slipped on a watery surface and fell.  She developed sudden onset of left knee joint pain.  Pain was so severe and she has been unable to ambulate on her feet since event.  Patient was brought into the emergency room to be further assessed.   Initial x-ray did reveal impacted fracture of the left medial condyle.  CT scan was requested by orthopedics with findings as noted below  Status post knee replacement with normal alignment. Subtle cortical step-off at the medial metaphyseal femoral cortex above the prosthesis suspicious for slightly impacted nondisplaced fracture. 2. Moderate knee effusion with small hematocrit level.   Due to significant pain, patient is unable to ambulate.  Patient was referred to hospitalist service for optimization of pain management and possible assessment for acute rehab placement.      Assessment/Plan:   Principal Problem:   Fracture of knee prosthesis, initial encounter (HCC) Active Problems:   Closed nondisplaced fracture of medial condyle of left femur (HCC)   Nutrition Problem: Increased nutrient needs Etiology: acute illness  Signs/Symptoms: estimated needs   Body mass index is 36.32 kg/m.  (Class II obesity)   Closed impacted left medial femoral condyle fracture: Analgesics as needed for pain.  She was evaluated by Dr. Lorelle, orthopedic surgeon.  Conservative management and nonweightbearing in the knee immobilizer were recommended.  Outpatient follow-up in  10 to 14 days for repeat x-rays and further evaluation.   PT and OT recommended discharge to SNF.  Follow-up with TOC to assist with disposition.   Probable AKI, probable CKD stage IIIa: Creatinine down from 1.40-1.02.  She has had fluctuating creatinine and actual CKD stage status difficult to determine. Creatinine was 0.94 in May 2025.   Hypokalemia: Improved   CAD s/p CABG, paroxysmal atrial fibrillation: Continue diltiazem , Eliquis , aspirin  and Lipitor    COPD: stable.  Continue bronchodilators   Comorbidities include Sjogren's syndrome on Plaquenil , gout on allopurinol , depression, mild to moderate dementia    Diet Order             Diet Heart Fluid consistency: Thin  Diet effective now                            Consultants: Orthopedic surgeon  Procedures: None    Medications:    allopurinol   100 mg Oral BID   apixaban   5 mg Oral BID   artificial tears  2 drop Both Eyes BID   aspirin  EC  81 mg Oral Daily   atorvastatin   80 mg Oral BH-q7a   cyanocobalamin   1,000 mcg Oral Daily   diltiazem   180 mg Oral Daily   donepezil   5 mg Oral QHS   feeding supplement  237 mL Oral BID BM   FLUoxetine   40 mg Oral Daily   hydroxychloroquine   200 mg Oral BID   lamoTRIgine   75 mg Oral BID   levothyroxine   25 mcg Oral Q0600   mirabegron  ER  25 mg Oral Daily   montelukast   10 mg Oral QHS   multivitamin with minerals  1 tablet Oral Daily   pantoprazole   40 mg Oral Daily   primidone   50 mg Oral BID   temazepam   30 mg Oral QHS   zolpidem   5 mg Oral QHS   Continuous Infusions:     Anti-infectives (From admission, onward)    Start     Dose/Rate Route Frequency Ordered Stop   08/17/23 1000  hydroxychloroquine  (PLAQUENIL ) tablet 200 mg        200 mg Oral 2 times daily 08/16/23 2154                Family Communication/Anticipated D/C date and plan/Code Status   DVT prophylaxis: SCDs Start: 08/16/23 2152 apixaban  (ELIQUIS ) tablet 5 mg     Code  Status: Full Code  Family Communication: None Disposition Plan: May need SNF at discharge   Status is: Inpatient Remains inpatient appropriate because: Left femoral condyle fracture       Subjective:   Interval events noted.  Pain in the left knee is better.  She thinks she can move leg better today compared to yesterday.  No other complaints.  Objective:    Vitals:   08/17/23 2048 08/17/23 2325 08/18/23 0419 08/18/23 0833  BP: (!) 154/58 (!) 122/49 (!) 119/51 (!) 146/52  Pulse: 68 (!) 59 (!) 57 63  Resp: 18 18 18 16   Temp: 98.2 F (36.8 C) 98.7 F (37.1 C) (!) 97.5 F (36.4 C) 97.9 F (36.6 C)  TempSrc:  Oral    SpO2: 95% 92% 95% 96%  Weight:      Height:       No data found.   Intake/Output Summary (Last 24 hours) at 08/18/2023 1149 Last data filed at 08/18/2023 1057 Gross per 24 hour  Intake 742.86 ml  Output 1700 ml  Net -957.14 ml   Filed Weights   08/16/23 1814  Weight: 102.1 kg    Exam:   GEN: NAD SKIN: Warm and dry EYES: No pallor or icterus ENT: MMM CV: RRR PULM: CTA B ABD: soft, obese, NT, +BS CNS: AAO x 3, non focal EXT: Left knee immobilizer in place.      Data Reviewed:   I have personally reviewed following labs and imaging studies:  Labs: Labs show the following:   Basic Metabolic Panel: Recent Labs  Lab 08/16/23 2233 08/17/23 1214 08/18/23 0457  NA 136 139 139  K 3.4* 4.4 3.7  CL 104 109 109  CO2 21* 21* 21*  GLUCOSE 91 86 99  BUN 33* 21 19  CREATININE 1.40* 1.03* 1.02*  CALCIUM  8.8* 8.9 8.7*   GFR Estimated Creatinine Clearance: 58.4 mL/min (A) (by C-G formula based on SCr of 1.02 mg/dL (H)). Liver Function Tests: No results for input(s): AST, ALT, ALKPHOS, BILITOT, PROT, ALBUMIN  in the last 168 hours. No results for input(s): LIPASE, AMYLASE in the last 168 hours. No results for input(s): AMMONIA in the last 168 hours. Coagulation profile No results for input(s): INR, PROTIME in the  last 168 hours.  CBC: Recent Labs  Lab 08/16/23 2233  WBC 8.5  NEUTROABS 5.6  HGB 10.6*  HCT 32.5*  MCV 93.7  PLT 240   Cardiac Enzymes: No results for input(s): CKTOTAL, CKMB, CKMBINDEX, TROPONINI in the last 168 hours. BNP (last 3 results) No results for input(s): PROBNP in the last 8760 hours. CBG: No results for input(s): GLUCAP in the last 168 hours.  D-Dimer: No results for input(s): DDIMER in the last 72 hours. Hgb A1c: No results for input(s): HGBA1C in the last 72 hours. Lipid Profile: No results for input(s): CHOL, HDL, LDLCALC, TRIG, CHOLHDL, LDLDIRECT in the last 72 hours. Thyroid  function studies: No results for input(s): TSH, T4TOTAL, T3FREE, THYROIDAB in the last 72 hours.  Invalid input(s): FREET3 Anemia work up: No results for input(s): VITAMINB12, FOLATE, FERRITIN, TIBC, IRON, RETICCTPCT in the last 72 hours. Sepsis Labs: Recent Labs  Lab 08/16/23 2233  WBC 8.5    Microbiology No results found for this or any previous visit (from the past 240 hours).  Procedures and diagnostic studies:  CT Knee Left Wo Contrast Result Date: 08/16/2023 CLINICAL DATA:  Mechanical fall EXAM: CT OF THE LEFT KNEE WITHOUT CONTRAST TECHNIQUE: Multidetector CT imaging of the left knee was performed according to the standard protocol. Multiplanar CT image reconstructions were also generated. RADIATION DOSE REDUCTION: This exam was performed according to the departmental dose-optimization program which includes automated exposure control, adjustment of the mA and/or kV according to patient size and/or use of iterative reconstruction technique. COMPARISON:  Radiographs 08/16/2023 FINDINGS: Bones/Joint/Cartilage Moderate knee effusion, small hematocrit level on series 3, image 43. Status post knee replacement with normal alignment. Artifact limits assessment of bony detail. Subtle cortical step-off at the medial metaphyseal femoral  cortex, coronal series 9, image 62 through 65 with subtle anterior to posterior lucency on axial series 5 image 61. Caudal extent of lucency difficult to evaluate due to extensive artifact from the hardware, lucency may extend to the prosthesis anteriorly, coronal series 9, image 51. No other discrete fractures are visualized. Ligaments Suboptimally assessed by CT. Muscles and Tendons No intramuscular fluid collection. No significant atrophy normal contour of quadriceps and patellar tendons Soft tissues Negative IMPRESSION: 1. Status post knee replacement with normal alignment. Artifact limits assessment of bony detail. Subtle cortical step-off at the medial metaphyseal femoral cortex above the prosthesis suspicious for slightly impacted nondisplaced fracture. Caudal extent of lucency difficult to evaluate due to extensive artifact from the hardware, fracture lucency may extend to prosthetic surface anteriorly on coronal views. 2. Moderate knee effusion with small hematocrit level. Electronically Signed   By: Luke Bun M.D.   On: 08/16/2023 20:17   DG Knee Complete 4 Views Left Result Date: 08/16/2023 CLINICAL DATA:  Fall EXAM: LEFT KNEE - COMPLETE 4+ VIEW COMPARISON:  09/02/2014 FINDINGS: Knee replacement with normal alignment. Acute slightly impacted fracture at the medial femoral condyle on oblique view. Moderate knee effusion. IMPRESSION: Knee replacement with acute slightly impacted fracture at the medial femoral condyle. Moderate knee effusion. Electronically Signed   By: Luke Bun M.D.   On: 08/16/2023 18:56               LOS: 2 days   Delainie Chavana  [facility]   Pager on [email or web address]. If 7PM-7AM, please contact night-coverage at [email or web address]     08/18/2023, 11:49 AM

## 2023-08-19 DIAGNOSIS — T84019A Broken internal joint prosthesis, unspecified site, initial encounter: Secondary | ICD-10-CM | POA: Diagnosis not present

## 2023-08-19 MED ORDER — ACETAMINOPHEN 325 MG PO TABS: 650.0000 mg | ORAL_TABLET | Freq: Four times a day (QID) | ORAL | Status: DC | PRN

## 2023-08-19 MED ORDER — HYDROCODONE-ACETAMINOPHEN 5-325 MG PO TABS
1.0000 | ORAL_TABLET | Freq: Three times a day (TID) | ORAL | Status: DC | PRN
  Administered 2023-08-19 – 2023-08-23 (×3): 1 via ORAL
  Filled 2023-08-19 (×3): qty 1

## 2023-08-19 NOTE — Progress Notes (Addendum)
 Progress Note    Tonya Walton  FMW:[id number] DOB: 1950-07-13  DOA: 08/16/2023 PCP: Cleotilde Oneil FALCON, MD      Brief Narrative:    Medical records reviewed and are as summarized below:  Tonya Walton is a 73 y.o. female  with medical history significant of atrial fibrillation, coronary disease status post CABG, atrial fibrillation on anticoagulation, history of bilateral knee replacement.  Patient had been in her usual state of health and was walking in her backyard when she slipped on a watery surface and fell.  She developed sudden onset of left knee joint pain.  Pain was so severe and she has been unable to ambulate on her feet since event.  Patient was brought into the emergency room to be further assessed.   Initial x-ray did reveal impacted fracture of the left medial condyle.  CT scan was requested by orthopedics with findings as noted below  Status post knee replacement with normal alignment. Subtle cortical step-off at the medial metaphyseal femoral cortex above the prosthesis suspicious for slightly impacted nondisplaced fracture. 2. Moderate knee effusion with small hematocrit level.   Due to significant pain, patient is unable to ambulate.  Patient was referred to hospitalist service for optimization of pain management and possible assessment for acute rehab placement.      Assessment/Plan:   Principal Problem:   Fracture of knee prosthesis, initial encounter (HCC) Active Problems:   Closed nondisplaced fracture of medial condyle of left femur (HCC)   Nutrition Problem: Increased nutrient needs Etiology: acute illness  Signs/Symptoms: estimated needs   Body mass index is 36.32 kg/m.  (Class II obesity)   Closed impacted left medial femoral condyle fracture: Analgesics as needed for pain.  She was evaluated by Dr. Lorelle, orthopedic surgeon.  Conservative management and nonweightbearing in the knee immobilizer were recommended.  Outpatient follow-up in  10 to 14 days for repeat x-rays and further evaluation.   PT and OT recommended discharge to SNF.  Follow-up with TOC to assist with disposition.  Patient requested to speak with the social Investment banker, operational.  Notified Rock, case Production designer, theatre/television/film.   Probable AKI, probable CKD stage IIIa: Creatinine down from 1.40-1.02.  She has had fluctuating creatinine and actual CKD stage status difficult to determine. Creatinine was 0.94 in May 2025.  She reported trouble passing urine today.  Bladder scan done by [place], RN, did not show any retained urine.  Discontinue morphine .  Use Vicodin sparingly.  Encouraged adequate oral hydration and continue to monitor.   Hypokalemia: Improved   CAD s/p CABG, paroxysmal atrial fibrillation: Continue diltiazem , Eliquis , aspirin  and Lipitor    COPD: stable.  Continue bronchodilators   Comorbidities include Sjogren's syndrome on Plaquenil , gout on allopurinol , depression, mild to moderate dementia    Diet Order             Diet Heart Fluid consistency: Thin  Diet effective now                            Consultants: Orthopedic surgeon  Procedures: None    Medications:    allopurinol   100 mg Oral BID   apixaban   5 mg Oral BID   artificial tears  2 drop Both Eyes BID   aspirin  EC  81 mg Oral Daily   atorvastatin   80 mg Oral BH-q7a   cyanocobalamin   1,000 mcg Oral Daily   diltiazem   180 mg Oral Daily   donepezil   5 mg Oral QHS   FLUoxetine   40 mg Oral Daily   hydroxychloroquine   200 mg Oral BID   lamoTRIgine   75 mg Oral BID   levothyroxine   25 mcg Oral Q0600   mirabegron  ER  25 mg Oral Daily   montelukast   10 mg Oral QHS   multivitamin with minerals  1 tablet Oral Daily   pantoprazole   40 mg Oral Daily   primidone   50 mg Oral BID   temazepam   30 mg Oral QHS   zolpidem   5 mg Oral QHS   Continuous Infusions:     Anti-infectives (From admission, onward)    Start     Dose/Rate Route Frequency Ordered Stop   08/17/23  1000  hydroxychloroquine  (PLAQUENIL ) tablet 200 mg        200 mg Oral 2 times daily 08/16/23 2154                Family Communication/Anticipated D/C date and plan/Code Status   DVT prophylaxis: SCDs Start: 08/16/23 2152 apixaban  (ELIQUIS ) tablet 5 mg     Code Status: Full Code  Family Communication: None Disposition Plan: May need SNF at discharge   Status is: Inpatient Remains inpatient appropriate because: Left femoral condyle fracture       Subjective:   Interval events noted.  She had no complaints earlier this morning.  However, later in the day, nurse reported that she had complained of having trouble with urination.  Objective:    Vitals:   08/18/23 1541 08/18/23 1928 08/19/23 0406 08/19/23 0730  BP: 121/82 (!) 127/44 (!) 139/55 (!) 145/57  Pulse: 62 62 (!) 57 64  Resp: 17 16 18 17   Temp: 98.2 F (36.8 C) 98.1 F (36.7 C) 97.8 F (36.6 C) 97.8 F (36.6 C)  TempSrc: Oral   Oral  SpO2: 94% 94% 95% 97%  Weight:      Height:       No data found.   Intake/Output Summary (Last 24 hours) at 08/19/2023 1418 Last data filed at 08/19/2023 0900 Gross per 24 hour  Intake 240 ml  Output 1200 ml  Net -960 ml   Filed Weights   08/16/23 1814  Weight: 102.1 kg    Exam:   GEN: NAD, sitting up in the chair SKIN: Warm and dry EYES: No pallor or icterus ENT: MMM CV: RRR PULM: CTA B ABD: soft, ND, NT, +BS CNS: AAO x 3, non focal EXT: Left knee immobilizer in place    Data Reviewed:   I have personally reviewed following labs and imaging studies:  Labs: Labs show the following:   Basic Metabolic Panel: Recent Labs  Lab 08/16/23 2233 08/17/23 1214 08/18/23 0457  NA 136 139 139  K 3.4* 4.4 3.7  CL 104 109 109  CO2 21* 21* 21*  GLUCOSE 91 86 99  BUN 33* 21 19  CREATININE 1.40* 1.03* 1.02*  CALCIUM  8.8* 8.9 8.7*   GFR Estimated Creatinine Clearance: 58.4 mL/min (A) (by C-G formula based on SCr of 1.02 mg/dL (H)). Liver Function  Tests: No results for input(s): AST, ALT, ALKPHOS, BILITOT, PROT, ALBUMIN  in the last 168 hours. No results for input(s): LIPASE, AMYLASE in the last 168 hours. No results for input(s): AMMONIA in the last 168 hours. Coagulation profile No results for input(s): INR, PROTIME in the last 168 hours.  CBC: Recent Labs  Lab 08/16/23 2233  WBC 8.5  NEUTROABS 5.6  HGB 10.6*  HCT 32.5*  MCV 93.7  PLT 240  Cardiac Enzymes: No results for input(s): CKTOTAL, CKMB, CKMBINDEX, TROPONINI in the last 168 hours. BNP (last 3 results) No results for input(s): PROBNP in the last 8760 hours. CBG: No results for input(s): GLUCAP in the last 168 hours. D-Dimer: No results for input(s): DDIMER in the last 72 hours. Hgb A1c: No results for input(s): HGBA1C in the last 72 hours. Lipid Profile: No results for input(s): CHOL, HDL, LDLCALC, TRIG, CHOLHDL, LDLDIRECT in the last 72 hours. Thyroid  function studies: No results for input(s): TSH, T4TOTAL, T3FREE, THYROIDAB in the last 72 hours.  Invalid input(s): FREET3 Anemia work up: No results for input(s): VITAMINB12, FOLATE, FERRITIN, TIBC, IRON, RETICCTPCT in the last 72 hours. Sepsis Labs: Recent Labs  Lab 08/16/23 2233  WBC 8.5    Microbiology No results found for this or any previous visit (from the past 240 hours).  Procedures and diagnostic studies:  No results found.              LOS: 3 days   Camella Seim  [facility]   Pager on [email or web address]. If 7PM-7AM, please contact night-coverage at [email or web address]     08/19/2023, 2:18 PM

## 2023-08-19 NOTE — Plan of Care (Signed)
  Problem: Education: Goal: Knowledge of General Education information will improve Description: Including pain rating scale, medication(s)/side effects and non-pharmacologic comfort measures Outcome: Progressing   Problem: Coping: Goal: Level of anxiety will decrease Outcome: Progressing   Problem: Elimination: Goal: Will not experience complications related to bowel motility Outcome: Progressing   Problem: Safety: Goal: Ability to remain free from injury will improve Outcome: Progressing

## 2023-08-20 DIAGNOSIS — T84019A Broken internal joint prosthesis, unspecified site, initial encounter: Secondary | ICD-10-CM | POA: Diagnosis not present

## 2023-08-20 LAB — CBC
HCT: 30.7 % — ABNORMAL LOW (ref 36.0–46.0)
Hemoglobin: 9.6 g/dL — ABNORMAL LOW (ref 12.0–15.0)
MCH: 30.2 pg (ref 26.0–34.0)
MCHC: 31.3 g/dL (ref 30.0–36.0)
MCV: 96.5 fL (ref 80.0–100.0)
Platelets: 235 K/uL (ref 150–400)
RBC: 3.18 MIL/uL — ABNORMAL LOW (ref 3.87–5.11)
RDW: 13.7 % (ref 11.5–15.5)
WBC: 6 K/uL (ref 4.0–10.5)
nRBC: 0 % (ref 0.0–0.2)

## 2023-08-20 LAB — BASIC METABOLIC PANEL WITH GFR
Anion gap: 10 (ref 5–15)
BUN: 19 mg/dL (ref 8–23)
CO2: 22 mmol/L (ref 22–32)
Calcium: 9.1 mg/dL (ref 8.9–10.3)
Chloride: 104 mmol/L (ref 98–111)
Creatinine, Ser: 0.85 mg/dL (ref 0.44–1.00)
GFR, Estimated: >60 mL/min (ref >60)
Glucose, Bld: 106 mg/dL — ABNORMAL HIGH (ref 70–99)
Potassium: 4.2 mmol/L (ref 3.5–5.1)
Sodium: 136 mmol/L (ref 135–145)

## 2023-08-20 NOTE — TOC Initial Note (Addendum)
 Transition of Care [facility]) - Initial/Assessment Note    Patient Details  Name: Tonya Walton MRN: [id number] Date of Birth: 28-Jul-1950  [facility]) CM/SW Contact:    Elouise LULLA Capri, RN 08/20/2023, 4:30 PM  Clinical Narrative:                  CM to patient's room regarding case management screening. Patient amenable to case management. Per patient lives with husband and 1 cat--vaccinated. Per patient no DME prior to hospitalization. Per patient, previous HHPT/OT experience.  CM and patient discussed SNF recommendations. Patient verbalized agreement to SNF.   CM will perform SNF workup.  CM faxed SNF referral packet to [facility], [facility], [facility]. CM will follow up.  CM initiated PASSR screening in [place] MUST, reference number [id number]. CM uploaded required documentation to [place] MUST. CM will follow up for PASSR approval.   Expected Discharge Plan: Skilled Nursing Facility Barriers to Discharge: Continued Medical Work up   Patient Goals and CMS Choice    SNF  Expected Discharge Plan and Services   SNF     Living arrangements for the past 2 months: Single Family Home   Prior Living Arrangements/Services Living arrangements for the past 2 months: Single Family Home Lives with:: Spouse, Pets (1 cat--vaccinated) Patient language and need for interpreter reviewed:: No Do you feel safe going back to the place where you live?: Yes      Need for Family Participation in Patient Care: Yes (Comment) Care giver support system in place?: Yes (comment)   Criminal Activity/Legal Involvement Pertinent to Current Situation/Hospitalization: No - Comment as needed  Activities of Daily Living   ADL Screening (condition at time of admission) Independently performs ADLs?: No Does the patient have a NEW difficulty with bathing/dressing/toileting/self-feeding that is expected to last >3 days?: Yes (Initiates electronic notice to provider  for possible OT consult) Does the patient have a NEW difficulty with getting in/out of bed, walking, or climbing stairs that is expected to last >3 days?: Yes (Initiates electronic notice to provider for possible PT consult) Does the patient have a NEW difficulty with communication that is expected to last >3 days?: No Is the patient deaf or have difficulty hearing?: No Does the patient have difficulty seeing, even when wearing glasses/contacts?: No Does the patient have difficulty concentrating, remembering, or making decisions?: No  Permission Sought/Granted Permission sought to share information with : Family Supports, Case Manager Permission granted to share information with : Yes, Verbal Permission Granted     Permission granted to share info w AGENCY: [facility] So[facility] Kiang  Permission granted to share info w Relationship: Husband/Nephew     Emotional Assessment Appearance:: Appears stated age Attitude/Demeanor/Rapport: Engaged Affect (typically observed): Calm Orientation: : Oriented to Self, Oriented to Place, Oriented to  Time, Oriented to Situation Alcohol  / Substance Use: Not Applicable    Admission diagnosis:  Effusion of right knee [M25.461] Fracture of knee prosthesis, initial encounter (HCC) [T84.019A] Acute pain of right knee [M25.561] Patient Active Problem List   Diagnosis Date Noted   Closed nondisplaced fracture of medial condyle of left femur (HCC) 08/17/2023   Fracture of knee prosthesis, initial encounter (HCC) 08/16/2023   Coronary artery disease with history of myocardial infarction without history of CABG 07/30/2020   Gastroenteritis 07/30/2020   Neutrophilic leukocytosis 07/30/2020   AKI (acute kidney injury) (HCC) 07/30/2020   Peripheral neuropathy, idiopathic 07/11/2017   Seizure disorder (HCC) 06/12/2017   Syncope 05/25/2017  Medicare annual wellness visit, initial 02/24/2016   Chest pain 04/01/2015   CAD in native artery 03/10/2015    Unstable angina pectoris (HCC) 02/23/2015   Benign essential HTN 10/20/2014   Chronic anxiety 10/20/2014   Anxiety, generalized 10/20/2014   Insomnia, persistent 10/20/2014   Recurrent major depressive episodes (HCC) 10/20/2014   Depression, major, single episode, severe (HCC) 10/20/2014   Combined fat and carbohydrate induced hyperlipemia 10/20/2014   Restless leg syndrome 10/20/2014   MDD (major depressive disorder), recurrent episode, moderate (HCC) 10/20/2014   AF (paroxysmal atrial fibrillation) (HCC) 07/31/2014   Paroxysmal atrial fibrillation (HCC) 07/31/2014   H/O total knee replacement 06/25/2014   Total knee replacement status 06/10/2014   H/O malignant neoplasm of breast 05/24/2014   Restless leg 05/24/2014   H/O: HTN (hypertension) 05/24/2014   History of atrial fibrillation 05/24/2014   CAFL (chronic airflow limitation) (HCC) 05/24/2014   Vitamin B12 deficiency 05/24/2014   H/O disease 05/24/2014   H/O: hypothyroidism 05/24/2014   H/O gastrointestinal disease 05/24/2014   Addison anemia 02/16/2014   Anxiety 11/06/2013   A-fib (HCC) 11/06/2013   Clinical depression 11/06/2013   Acid reflux 11/06/2013   Heart valve disease 11/06/2013   Primary cancer of right female breast (HCC) 11/06/2013   HLD (hyperlipidemia) 11/06/2013   BP (high blood pressure) 11/06/2013   Atrial fibrillation (HCC) 11/06/2013   Chronic obstructive pulmonary disease (HCC) 11/06/2013   Hormone receptor positive malignant neoplasm of breast (HCC) 11/06/2013   Adult hypothyroidism 06/29/2010   Adult BMI 30+ 06/29/2010   B-complex deficiency 12/05/1995   Extremity pain 12/05/1995   PCP:  Cleotilde Oneil FALCON, MD Pharmacy:   [facility] [address], [place] - [address] [place] [address] Phone: [phone] Fax: [phone]     Social Drivers of Health (SDOH) Social History: SDOH Screenings   Food Insecurity: No Food Insecurity (08/17/2023)  Housing: Unknown (08/17/2023)   Transportation Needs: No Transportation Needs (08/17/2023)  Utilities: Not At Risk (08/17/2023)  Financial Resource Strain: Low Risk  (08/01/2022)   Received from [facility]  Social Connections: Moderately Integrated (08/17/2023)  Tobacco Use: Medium Risk (08/16/2023)   Received from [facility]   SDOH Interventions:     Readmission Risk Interventions     No data to display

## 2023-08-20 NOTE — Plan of Care (Signed)
   Problem: Education: Goal: Knowledge of General Education information will improve Description Including pain rating scale, medication(s)/side effects and non-pharmacologic comfort measures Outcome: Progressing

## 2023-08-20 NOTE — Progress Notes (Signed)
 Progress Note    Tonya Walton  FMW:[id number] DOB: Mar 30, 1950  DOA: 08/16/2023 PCP: Cleotilde Oneil FALCON, MD      Brief Narrative:    Medical records reviewed and are as summarized below:  Tonya Walton is a 73 y.o. female  with medical history significant of atrial fibrillation, coronary disease status post CABG, atrial fibrillation on anticoagulation, history of bilateral knee replacement.  Patient had been in her usual state of health and was walking in her backyard when she slipped on a watery surface and fell.  She developed sudden onset of left knee joint pain.  Pain was so severe and she has been unable to ambulate on her feet since event.  Patient was brought into the emergency room to be further assessed.   Initial x-ray did reveal impacted fracture of the left medial condyle.  CT scan was requested by orthopedics with findings as noted below  Status post knee replacement with normal alignment. Subtle cortical step-off at the medial metaphyseal femoral cortex above the prosthesis suspicious for slightly impacted nondisplaced fracture. 2. Moderate knee effusion with small hematocrit level.   Due to significant pain, patient is unable to ambulate.  Patient was referred to hospitalist service for optimization of pain management and possible assessment for acute rehab placement.      Assessment/Plan:   Principal Problem:   Fracture of knee prosthesis, initial encounter (HCC) Active Problems:   Closed nondisplaced fracture of medial condyle of left femur (HCC)   Nutrition Problem: Increased nutrient needs Etiology: acute illness  Signs/Symptoms: estimated needs   Body mass index is 36.32 kg/m.  (Class II obesity)   Closed impacted left medial femoral condyle fracture: Analgesics as needed for pain.  She was evaluated by Dr. Lorelle, orthopedic surgeon.  Conservative management and nonweightbearing in the knee immobilizer were recommended.  Outpatient follow-up in  10 to 14 days (from date of admission) for repeat x-rays and further evaluation.   PT and OT recommended discharge to SNF.  Follow-up with TOC to assist with disposition.  Patient requested to speak with the social Investment banker, operational.  Notified Rock, case Production designer, theatre/television/film.   Probable AKI, probable CKD stage IIIa: Creatinine down from 1.40-1.02.-0.94  She has had fluctuating creatinine and actual CKD stage status difficult to determine. Creatinine was 0.94 in May 2025.  She reported trouble passing urine today.  Bladder scan done by Asberry, RN, did not show any retained urine.  Discontinue morphine .  Use Vicodin sparingly.  Encouraged adequate oral hydration and continue to monitor.   Hypokalemia: Improved   CAD s/p CABG, paroxysmal atrial fibrillation: Continue diltiazem , Eliquis , aspirin  and Lipitor    COPD: stable.  Continue bronchodilators   Comorbidities include Sjogren's syndrome on Plaquenil , gout on allopurinol , depression, mild to moderate dementia  Medically stable for discharge.  Awaiting placement to SNF.  Diet Order             Diet Heart Fluid consistency: Thin  Diet effective now                            Consultants: Orthopedic surgeon  Procedures: None    Medications:    allopurinol   100 mg Oral BID   apixaban   5 mg Oral BID   artificial tears  2 drop Both Eyes BID   aspirin  EC  81 mg Oral Daily   atorvastatin   80 mg Oral BH-q7a   cyanocobalamin   1,000 mcg Oral  Daily   diltiazem   180 mg Oral Daily   donepezil   5 mg Oral QHS   FLUoxetine   40 mg Oral Daily   hydroxychloroquine   200 mg Oral BID   lamoTRIgine   75 mg Oral BID   levothyroxine   25 mcg Oral Q0600   mirabegron  ER  25 mg Oral Daily   montelukast   10 mg Oral QHS   multivitamin with minerals  1 tablet Oral Daily   pantoprazole   40 mg Oral Daily   primidone   50 mg Oral BID   temazepam   30 mg Oral QHS   zolpidem   5 mg Oral QHS   Continuous Infusions:     Anti-infectives  (From admission, onward)    Start     Dose/Rate Route Frequency Ordered Stop   08/17/23 1000  hydroxychloroquine  (PLAQUENIL ) tablet 200 mg        200 mg Oral 2 times daily 08/16/23 2154                Family Communication/Anticipated D/C date and plan/Code Status   DVT prophylaxis: SCDs Start: 08/16/23 2152 apixaban  (ELIQUIS ) tablet 5 mg     Code Status: Full Code  Family Communication: None Disposition Plan: May need SNF at discharge   Status is: Inpatient Remains inpatient appropriate because: Left femoral condyle fracture       Subjective:   No events noted.  No complaints.  She is urinating okay.  She says she realize she was not drinking enough water  but she is now trying to drink as much fluids as she can.  Pain in the left knee is better.  She asked about disposition plans.  Objective:    Vitals:   08/19/23 1525 08/19/23 2027 08/20/23 0248 08/20/23 0752  BP: (!) 157/55 (!) 142/55 (!) 135/49 (!) 150/57  Pulse: 67 62 (!) 57 67  Resp: 18 16 18 17   Temp: (!) 97.2 F (36.2 C) 98.1 F (36.7 C) 97.6 F (36.4 C) 98.1 F (36.7 C)  TempSrc:  Oral    SpO2: 100% 98% 94% 95%  Weight:      Height:       No data found.   Intake/Output Summary (Last 24 hours) at 08/20/2023 1058 Last data filed at 08/20/2023 1050 Gross per 24 hour  Intake 1520 ml  Output 1660 ml  Net -140 ml   Filed Weights   08/16/23 1814  Weight: 102.1 kg    Exam:   GEN: NAD SKIN: Warm and dry EYES: No pallor or icterus ENT: MMM CV: RRR PULM: CTA B ABD: soft, ND, NT, +BS CNS: AAO x 3, non focal EXT: Left knee immobilizer in place    Data Reviewed:   I have personally reviewed following labs and imaging studies:  Labs: Labs show the following:   Basic Metabolic Panel: Recent Labs  Lab 08/16/23 2233 08/17/23 1214 08/18/23 0457 08/20/23 0404  NA 136 139 139 136  K 3.4* 4.4 3.7 4.2  CL 104 109 109 104  CO2 21* 21* 21* 22  GLUCOSE 91 86 99 106*  BUN 33* 21 19  19   CREATININE 1.40* 1.03* 1.02* 0.85  CALCIUM  8.8* 8.9 8.7* 9.1   GFR Estimated Creatinine Clearance: 70 mL/min (by C-G formula based on SCr of 0.85 mg/dL). Liver Function Tests: No results for input(s): AST, ALT, ALKPHOS, BILITOT, PROT, ALBUMIN  in the last 168 hours. No results for input(s): LIPASE, AMYLASE in the last 168 hours. No results for input(s): AMMONIA in the last 168 hours.  Coagulation profile No results for input(s): INR, PROTIME in the last 168 hours.  CBC: Recent Labs  Lab 08/16/23 2233 08/20/23 0404  WBC 8.5 6.0  NEUTROABS 5.6  --   HGB 10.6* 9.6*  HCT 32.5* 30.7*  MCV 93.7 96.5  PLT 240 235   Cardiac Enzymes: No results for input(s): CKTOTAL, CKMB, CKMBINDEX, TROPONINI in the last 168 hours. BNP (last 3 results) No results for input(s): PROBNP in the last 8760 hours. CBG: No results for input(s): GLUCAP in the last 168 hours. D-Dimer: No results for input(s): DDIMER in the last 72 hours. Hgb A1c: No results for input(s): HGBA1C in the last 72 hours. Lipid Profile: No results for input(s): CHOL, HDL, LDLCALC, TRIG, CHOLHDL, LDLDIRECT in the last 72 hours. Thyroid  function studies: No results for input(s): TSH, T4TOTAL, T3FREE, THYROIDAB in the last 72 hours.  Invalid input(s): FREET3 Anemia work up: No results for input(s): VITAMINB12, FOLATE, FERRITIN, TIBC, IRON, RETICCTPCT in the last 72 hours. Sepsis Labs: Recent Labs  Lab 08/16/23 2233 08/20/23 0404  WBC 8.5 6.0    Microbiology No results found for this or any previous visit (from the past 240 hours).  Procedures and diagnostic studies:  No results found.              LOS: 4 days   Jasmin Winberry  [facility]   Pager on [email or web address]. If 7PM-7AM, please contact night-coverage at [email or web address]     08/20/2023, 10:58 AM

## 2023-08-20 NOTE — Plan of Care (Signed)
   Problem: Education: Goal: Knowledge of General Education information will improve Description: Including pain rating scale, medication(s)/side effects and non-pharmacologic comfort measures Outcome: Progressing   Problem: Clinical Measurements: Goal: Will remain free from infection Outcome: Progressing   Problem: Activity: Goal: Risk for activity intolerance will decrease Outcome: Progressing

## 2023-08-20 NOTE — Progress Notes (Signed)
 Physical Therapy Treatment Patient Details Name: Tonya Walton MRN: [id number] DOB: 03/03/1950 Today's Date: 08/20/2023   History of Present Illness Pt is a 73 year old female presented to the ED after a fall, admitted for pain control with imaging showing a Left periprosthetic distal femur medial femoral condyle fracture      PMH significant for atrial fibrillation, coronary disease status post CABG, atrial fibrillation on anticoagulation, history of bilateral knee replacement    PT Comments  Pt sitting upright at EOB upon arrival with OT present in room.  Pt noted to have voided on the floor and therapists assisted in cleaning prior to getting up and transferring.  Pt performed well with transfer and utilized modA +2 for safety and to keep walker stabilized when maneuvering to the chair.  Pt able to scoot the foot R LE over to the chair and keep all weight off the L LE.  Pt then sat with all needs met and call bell within reach.     If plan is discharge home, recommend the following: Two people to help with walking and/or transfers;Two people to help with bathing/dressing/bathroom;Assistance with cooking/housework;Assist for transportation;Help with stairs or ramp for entrance   Can travel by private vehicle     No  Equipment Recommendations       Recommendations for Other Services       Precautions / Restrictions Precautions Precautions: Fall Recall of Precautions/Restrictions: Impaired Required Braces or Orthoses: Knee Immobilizer - Left Knee Immobilizer - Left: On when out of bed or walking Restrictions Weight Bearing Restrictions Per Provider Order: Yes LLE Weight Bearing Per Provider Order: Non weight bearing Other Position/Activity Restrictions: in KI     Mobility  Bed Mobility               General bed mobility comments: pt sitting upright in bed with OT upon arrival to the room.    Transfers Overall transfer level: Needs assistance Equipment used: Rolling  walker (2 wheels) Transfers: Sit to/from Stand Sit to Stand: Min assist, Mod assist, +2 physical assistance           General transfer comment: Pt required +2 modA to complete STS from chair with RW. Pt with much better ability to not place weight on the L LE when in standing.    Ambulation/Gait Ambulation/Gait assistance: Min assist, +2 physical assistance, +2 safety/equipment Gait Distance (Feet): 3 Feet Assistive device: Rolling walker (2 wheels)   Gait velocity: decreased     General Gait Details: Therapist assisted with physical support, along with movement of the walker in order for the pt to perform a safe transfer to the chair.  Pt able to laterally scoot the R foot to mobilize to the chair.   Stairs             Wheelchair Mobility     Tilt Bed    Modified Rankin (Stroke Patients Only)       Balance Overall balance assessment: Needs assistance Sitting-balance support: Feet supported, No upper extremity supported Sitting balance-Leahy Scale: Good     Standing balance support: Bilateral upper extremity supported, During functional activity, Reliant on assistive device for balance Standing balance-Leahy Scale: Poor Standing balance comment: Pt requires +2 physical assistance to maintain balance in stance with RW while NWB on LLE                            Communication Communication Communication: No apparent difficulties  Cognition Arousal: Alert Behavior During Therapy: WFL for tasks assessed/performed                             Following commands: Intact      Cueing Cueing Techniques: Verbal cues  Exercises      General Comments        Pertinent Vitals/Pain Pain Assessment Pain Assessment: Faces Faces Pain Scale: Hurts a little bit Pain Location: LLE Pain Descriptors / Indicators: Discomfort Pain Intervention(s): Limited activity within patient's tolerance, Monitored during session, Repositioned    Home  Living                          Prior Function            PT Goals (current goals can now be found in the care plan section) Acute Rehab PT Goals Patient Stated Goal: to return to daily routine independently PT Goal Formulation: With patient Time For Goal Achievement: 08/30/23 Potential to Achieve Goals: Good Progress towards PT goals: Progressing toward goals    Frequency    Min 2X/week      PT Plan      Co-evaluation              AM-PAC PT 6 Clicks Mobility   Outcome Measure  Help needed turning from your back to your side while in a flat bed without using bedrails?: A Lot Help needed moving from lying on your back to sitting on the side of a flat bed without using bedrails?: A Lot Help needed moving to and from a bed to a chair (including a wheelchair)?: A Lot Help needed standing up from a chair using your arms (e.g., wheelchair or bedside chair)?: A Lot Help needed to walk in hospital room?: Total Help needed climbing 3-5 steps with a railing? : Total 6 Click Score: 10    End of Session Equipment Utilized During Treatment: Gait belt Activity Tolerance: Patient limited by pain Patient left: in chair;with call bell/phone within reach;with chair alarm set Nurse Communication: Mobility status PT Visit Diagnosis: Unsteadiness on feet (R26.81);Muscle weakness (generalized) (M62.81);Difficulty in walking, not elsewhere classified (R26.2);Pain Pain - Right/Left: Left Pain - part of body: Knee     Time: 8890-8871 PT Time Calculation (min) (ACUTE ONLY): 19 min  Charges:    $Therapeutic Activity: 8-22 mins PT General Charges $$ ACUTE PT VISIT: 1 Visit                     Fonda Simpers, PT, DPT Physical Therapist - [facility]  08/20/23, 2:09 PM

## 2023-08-20 NOTE — Plan of Care (Signed)
  Problem: Education: Goal: Knowledge of General Education information will improve Description: Including pain rating scale, medication(s)/side effects and non-pharmacologic comfort measures Outcome: Progressing   Problem: Health Behavior/Discharge Planning: Goal: Ability to manage health-related needs will improve Outcome: Progressing   Problem: Clinical Measurements: Goal: Cardiovascular complication will be avoided Outcome: Progressing   Problem: Nutrition: Goal: Adequate nutrition will be maintained Outcome: Progressing   Problem: Pain Managment: Goal: General experience of comfort will improve and/or be controlled Outcome: Progressing

## 2023-08-20 NOTE — Care Management Important Message (Signed)
 Important Message  Patient Details  Name: Tonya Walton MRN: [id number] Date of Birth: 02-07-1950   Important Message Given:  Yes - Medicare IM     Mallary Kreger W, CMA 08/20/2023, 12:28 PM

## 2023-08-20 NOTE — Progress Notes (Signed)
 Occupational Therapy Treatment Patient Details Name: Tonya Walton MRN: [id number] DOB: 11-22-1950 Today's Date: 08/20/2023   History of present illness Pt is a 73 year old female presented to the ED after a fall, admitted for pain control with imaging showing a Left periprosthetic distal femur medial femoral condyle fracture      PMH significant for atrial fibrillation, coronary disease status post CABG, atrial fibrillation on anticoagulation, history of bilateral knee replacement   OT comments  Pt received in bed, agreeable to co-tx with OT/PT for safety during functional transfers given NWB precautions with KI donned on LLE. Pt able to perform bed mobility with MIN A, attempted STS with RW and +1 MAX A with pt unable to adhere to WB precautions / incontinent of urine in standing and returned to seated. PT in room, MOD A +2 for transfer from bed > BSC. Pt completes pericare from lateral leans / standing with MIN A, and shuffles with heavy UE reliance on RW to sit in recliner with +2 for safety/stability of AD. Pt making progress towards goals, discharge recommendation appropriate.       If plan is discharge home, recommend the following:  A lot of help with walking and/or transfers;A lot of help with bathing/dressing/bathroom;Help with stairs or ramp for entrance;Assistance with cooking/housework;Assist for transportation   Equipment Recommendations  Other (comment)       Precautions / Restrictions Precautions Precautions: Fall Recall of Precautions/Restrictions: Impaired Required Braces or Orthoses: Knee Immobilizer - Left Knee Immobilizer - Left: On when out of bed or walking Restrictions Weight Bearing Restrictions Per Provider Order: Yes LLE Weight Bearing Per Provider Order: Non weight bearing Other Position/Activity Restrictions: in KI       Mobility Bed Mobility Overal bed mobility: Needs Assistance Bed Mobility: Supine to Sit     Supine to sit: Min assist, HOB  elevated, Used rails     General bed mobility comments: light minA to scoot hips forward and cues for NWB once seated EOB    Transfers Overall transfer level: Needs assistance Equipment used: Rolling walker (2 wheels) Transfers: Sit to/from Stand Sit to Stand: Min assist, Mod assist, +2 physical assistance                 Balance Overall balance assessment: Needs assistance Sitting-balance support: Feet supported, No upper extremity supported Sitting balance-Leahy Scale: Good     Standing balance support: Bilateral upper extremity supported, During functional activity, Reliant on assistive device for balance Standing balance-Leahy Scale: Poor Standing balance comment: Pt requires +2 physical assistance to maintain balance in stance with RW while NWB on LLE                           ADL either performed or assessed with clinical judgement   ADL Overall ADL's : Needs assistance/impaired                         Toilet Transfer: Moderate assistance;Stand-pivot;+2 for physical assistance;+2 for safety/equipment;BSC/3in1;Rolling walker (2 wheels) Toilet Transfer Details (indicate cue type and reason): to [facility] > recliner, cues to maintain NWB on LLE Toileting- Clothing Manipulation and Hygiene: Minimal assistance;Sit to/from stand Toileting - Clothing Manipulation Details (indicate cue type and reason): able to perform pericare partially seated with lat leans, then in standing     Functional mobility during ADLs: Rolling walker (2 wheels);Cueing for sequencing;Cueing for safety General ADL Comments: pt in bed, voicing need  for void. pt stands with +1 MAX A, unable to maintain NWB LLE with KI donned and R lateral lean. pt incontinent of urine in standing, returned to seated. second standing attempt with +2, able to step pivot to [facility] with RW, cues for safety, sequencing and hand placement, and perform pericare with minA.     Communication  Communication Communication: No apparent difficulties   Cognition Arousal: Alert Behavior During Therapy: WFL for tasks assessed/performed Cognition: No apparent impairments                               Following commands: Intact        Cueing   Cueing Techniques: Verbal cues             Pertinent Vitals/ Pain       Pain Assessment Pain Assessment: Faces Faces Pain Scale: Hurts a little bit Pain Location: LLE Pain Descriptors / Indicators: Discomfort Pain Intervention(s): Limited activity within patient's tolerance, Patient requesting pain meds-RN notified   Frequency  Min 2X/week        Progress Toward Goals  OT Goals(current goals can now be found in the care plan section)  Progress towards OT goals: Progressing toward goals  Acute Rehab OT Goals OT Goal Formulation: With patient Time For Goal Achievement: 08/31/23 Potential to Achieve Goals: Good ADL Goals Pt Will Perform Grooming: with modified independence;sitting Pt Will Perform Lower Body Dressing: with modified independence;sitting/lateral leans;sit to/from stand Pt Will Transfer to Toilet: with modified independence Pt Will Perform Toileting - Clothing Manipulation and hygiene: with modified independence;sitting/lateral leans;sit to/from stand  Plan         AM-PAC OT 6 Clicks Daily Activity     Outcome Measure   Help from another person eating meals?: None Help from another person taking care of personal grooming?: None Help from another person toileting, which includes using toliet, bedpan, or urinal?: A Lot Help from another person bathing (including washing, rinsing, drying)?: A Lot Help from another person to put on and taking off regular upper body clothing?: A Little Help from another person to put on and taking off regular lower body clothing?: A Lot 6 Click Score: 17    End of Session Equipment Utilized During Treatment: Rolling walker (2 wheels);Gait belt  OT Visit  Diagnosis: Other abnormalities of gait and mobility (R26.89)   Activity Tolerance Patient tolerated treatment well   Patient Left in chair;with call bell/phone within reach;with chair alarm set   Nurse Communication Mobility status;Patient requests pain meds (RN messaged prior to session with pt request for pain meds after therapy)        Time: 8944-8871 OT Time Calculation (min): 33 min  Charges: OT General Charges $OT Visit: 1 Visit OT Treatments $Self Care/Home Management : 8-22 mins Kori Goins L. Alzena Gerber, OTR/L  08/20/23, 2:57 PM

## 2023-08-20 NOTE — TOC Progression Note (Signed)
 Transition of Care [facility]) - Progression Note    Patient Details  Name: Tonya Walton MRN: [id number] Date of Birth: [birth year]  Transition of [facility]) CM/SW Contact  Elouise LULLA Capri, RN 08/20/2023, 4:48 PM  Clinical Narrative:     The above named patient is recommended to go to Short Term Rehab for strengthening and gait training for balance.  It is expected that the Short Term Rehab stay will be less than 30 days.  The patient is expected to return home after Rehab.  Expected Discharge Plan: Skilled Nursing Facility Barriers to Discharge: Continued Medical Work up  Expected Discharge Plan and Services    SNF   Living arrangements for the past 2 months: Single Family Home   Social Determinants of Health (SDOH) Interventions SDOH Screenings   Food Insecurity: No Food Insecurity (08/17/2023)  Housing: Unknown (08/17/2023)  Transportation Needs: No Transportation Needs (08/17/2023)  Utilities: Not At Risk (08/17/2023)  Financial Resource Strain: Low Risk  (08/01/2022)   Received from [facility]  Social Connections: Moderately Integrated (08/17/2023)  Tobacco Use: Medium Risk (08/16/2023)   Received from [facility]    Readmission Risk Interventions     No data to display

## 2023-08-20 NOTE — NC FL2 (Signed)
 [place]  MEDICAID [id number] LEVEL OF CARE FORM     IDENTIFICATION  Patient Name: Tonya Walton Birthdate: July 09, 1950 Sex: female Admission Date (Current Location): 08/16/2023  [facility] and [place] Number:  Chiropodist and Address:  [facility], [address] [place], [place] [address]      Provider Number: [id number]  Attending Physician Name and Address:  Jens Durand, MD  Relative Name and Phone Number:  Meriah Shands, husband, phone: [phone]; Juliane Kiang, nephew, phone: [phone]    Current Level of Care: Hospital Recommended Level of Care: Skilled Nursing Facility Prior Approval Number:    Date Approved/Denied:   PASRR Number:    Discharge Plan: SNF    Current Diagnoses: Patient Active Problem List   Diagnosis Date Noted   Closed nondisplaced fracture of medial condyle of left femur (HCC) 08/17/2023   Fracture of knee prosthesis, initial encounter (HCC) 08/16/2023   Coronary artery disease with history of myocardial infarction without history of CABG 07/30/2020   Gastroenteritis 07/30/2020   Neutrophilic leukocytosis 07/30/2020   AKI (acute kidney injury) (HCC) 07/30/2020   Peripheral neuropathy, idiopathic 07/11/2017   Seizure disorder (HCC) 06/12/2017   Syncope 05/25/2017   Medicare annual wellness visit, initial 02/24/2016   Chest pain 04/01/2015   CAD in native artery 03/10/2015   Unstable angina pectoris (HCC) 02/23/2015   Benign essential HTN 10/20/2014   Chronic anxiety 10/20/2014   Anxiety, generalized 10/20/2014   Insomnia, persistent 10/20/2014   Recurrent major depressive episodes (HCC) 10/20/2014   Depression, major, single episode, severe (HCC) 10/20/2014   Combined fat and carbohydrate induced hyperlipemia 10/20/2014   Restless leg syndrome 10/20/2014   MDD (major depressive disorder), recurrent episode, moderate (HCC) 10/20/2014   AF (paroxysmal atrial fibrillation) (HCC) 07/31/2014   Paroxysmal  atrial fibrillation (HCC) 07/31/2014   H/O total knee replacement 06/25/2014   Total knee replacement status 06/10/2014   H/O malignant neoplasm of breast 05/24/2014   Restless leg 05/24/2014   H/O: HTN (hypertension) 05/24/2014   History of atrial fibrillation 05/24/2014   CAFL (chronic airflow limitation) (HCC) 05/24/2014   Vitamin B12 deficiency 05/24/2014   H/O disease 05/24/2014   H/O: hypothyroidism 05/24/2014   H/O gastrointestinal disease 05/24/2014   Addison anemia 02/16/2014   Anxiety 11/06/2013   A-fib (HCC) 11/06/2013   Clinical depression 11/06/2013   Acid reflux 11/06/2013   Heart valve disease 11/06/2013   Primary cancer of right female breast (HCC) 11/06/2013   HLD (hyperlipidemia) 11/06/2013   BP (high blood pressure) 11/06/2013   Atrial fibrillation (HCC) 11/06/2013   Chronic obstructive pulmonary disease (HCC) 11/06/2013   Hormone receptor positive malignant neoplasm of breast (HCC) 11/06/2013   Adult hypothyroidism 06/29/2010   Adult BMI 30+ 06/29/2010   B-complex deficiency 12/05/1995   Extremity pain 12/05/1995    Orientation RESPIRATION BLADDER Height & Weight     Self, Time, Situation, Place  Normal Continent Weight: 102.1 kg Height:  5' 6 (167.6 cm)  BEHAVIORAL SYMPTOMS/MOOD NEUROLOGICAL BOWEL NUTRITION STATUS      Continent  (Please see discharge summary)  AMBULATORY STATUS COMMUNICATION OF NEEDS Skin   Extensive Assist Verbally  (Dry, ecchymosis, bilateral breast)                       Personal Care Assistance Level of Assistance  Bathing, Feeding, Dressing Bathing Assistance: Limited assistance Feeding assistance: Limited assistance Dressing Assistance: Limited assistance     Functional Limitations Info  Sight Sight Info: Impaired  SPECIAL CARE FACTORS FREQUENCY  PT (By licensed PT), OT (By licensed OT)     PT Frequency: 5 x per week OT Frequency: 5 x per week            Contractures      Additional Factors  Info  Code Status, Allergies Code Status Info: Full Code Allergies Info: No Know Allergies           Current Medications (08/20/2023):  This is the current hospital active medication list Current Facility-Administered Medications  Medication Dose Route Frequency Provider Last Rate Last Admin   acetaminophen  (TYLENOL ) tablet 650 mg  650 mg Oral Q6H PRN Jens Durand, MD       allopurinol  (ZYLOPRIM ) tablet 100 mg  100 mg Oral BID Acheampong, Peter K, MD   100 mg at 08/20/23 1003   apixaban  (ELIQUIS ) tablet 5 mg  5 mg Oral BID Acheampong, Peter K, MD   5 mg at 08/20/23 1005   artificial tears ophthalmic solution 2 drop  2 drop Both Eyes BID Jens Durand, MD   2 drop at 08/20/23 1006   aspirin  EC tablet 81 mg  81 mg Oral Daily Acheampong, Peter K, MD   81 mg at 08/20/23 1005   atorvastatin  (LIPITOR ) tablet 80 mg  80 mg Oral BH-q7a Acheampong, Peter K, MD   80 mg at 08/20/23 9378   cyanocobalamin  (VITAMIN B12) tablet 1,000 mcg  1,000 mcg Oral Daily Jens Durand, MD   1,000 mcg at 08/20/23 1005   diltiazem  (CARDIZEM  CD) 24 hr capsule 180 mg  180 mg Oral Daily Jens Durand, MD   180 mg at 08/20/23 1003   donepezil  (ARICEPT ) tablet 5 mg  5 mg Oral QHS Acheampong, Peter K, MD   5 mg at 08/19/23 2248   FLUoxetine  (PROZAC ) capsule 40 mg  40 mg Oral Daily Acheampong, Peter K, MD   40 mg at 08/20/23 1005   HYDROcodone -acetaminophen  (NORCO/VICODIN) 5-325 MG per tablet 1 tablet  1 tablet Oral Q8H PRN Jens Durand, MD   1 tablet at 08/20/23 1424   hydroxychloroquine  (PLAQUENIL ) tablet 200 mg  200 mg Oral BID Acheampong, Peter K, MD   200 mg at 08/20/23 1005   lamoTRIgine  (LAMICTAL ) tablet 75 mg  75 mg Oral BID Acheampong, Peter K, MD   75 mg at 08/20/23 1003   levothyroxine  (SYNTHROID ) tablet 25 mcg  25 mcg Oral Q0600 Acheampong, Peter K, MD   25 mcg at 08/20/23 9378   mirabegron  ER (MYRBETRIQ ) tablet 25 mg  25 mg Oral Daily Acheampong, Peter K, MD   25 mg at 08/20/23 1003   montelukast  (SINGULAIR )  tablet 10 mg  10 mg Oral QHS Jens Durand, MD   10 mg at 08/19/23 2247   multivitamin with minerals tablet 1 tablet  1 tablet Oral Daily Jens Durand, MD   1 tablet at 08/20/23 1005   ondansetron  (ZOFRAN ) tablet 4 mg  4 mg Oral Q8H PRN Acheampong, Peter K, MD       pantoprazole  (PROTONIX ) EC tablet 40 mg  40 mg Oral Daily Acheampong, Peter K, MD   40 mg at 08/20/23 1005   primidone  (MYSOLINE ) tablet 50 mg  50 mg Oral BID Jens Durand, MD   50 mg at 08/20/23 1003   senna-docusate (Senokot-S) tablet 1 tablet  1 tablet Oral QHS PRN Acheampong, Peter K, MD   1 tablet at 08/19/23 1255   temazepam  (RESTORIL ) capsule 30 mg  30 mg Oral QHS Jens Durand, MD  30 mg at 08/19/23 2248   zolpidem  (AMBIEN ) tablet 5 mg  5 mg Oral QHS Acheampong, Peter K, MD   5 mg at 08/19/23 2248     Discharge Medications: Please see discharge summary for a list of discharge medications.  Relevant Imaging Results:  Relevant Lab Results:   Additional Information SSN:[id number]  Elouise LULLA Capri, RN

## 2023-08-21 MED ORDER — ENSURE PLUS HIGH PROTEIN PO LIQD: 237.0000 mL | Freq: Two times a day (BID) | ORAL | Status: DC

## 2023-08-21 NOTE — Progress Notes (Addendum)
 Progress Note    Tonya Walton  FMW:[id number] DOB: 07-Oct-1950  DOA: 08/16/2023 PCP: Cleotilde Oneil FALCON, MD      Brief Narrative:    Medical records reviewed and are as summarized below:  Tonya Walton is a 73 y.o. female  with medical history significant of atrial fibrillation, coronary disease status post CABG, atrial fibrillation on anticoagulation, history of bilateral knee replacement.  Patient had been in her usual state of health and was walking in her backyard when she slipped on a watery surface and fell.  She developed sudden onset of left knee joint pain.  Pain was so severe and she has been unable to ambulate on her feet since event.  Patient was brought into the emergency room to be further assessed.   Initial x-ray did reveal impacted fracture of the left medial condyle.  CT scan was requested by orthopedics with findings as noted below  Status post knee replacement with normal alignment. Subtle cortical step-off at the medial metaphyseal femoral cortex above the prosthesis suspicious for slightly impacted nondisplaced fracture. 2. Moderate knee effusion with small hematocrit level.   Due to significant pain, patient is unable to ambulate.  Patient was referred to hospitalist service for optimization of pain management and possible assessment for acute rehab placement.      Assessment/Plan:   Principal Problem:   Fracture of knee prosthesis, initial encounter (HCC) Active Problems:   Closed nondisplaced fracture of medial condyle of left femur (HCC)   Nutrition Problem: Increased nutrient needs Etiology: acute illness  Signs/Symptoms: estimated needs   Body mass index is 36.32 kg/m.  (Class II obesity)   Closed impacted left medial femoral condyle fracture: Analgesics as needed for pain.  She was evaluated by Dr. Lorelle, orthopedic surgeon.  Conservative management and nonweightbearing in the knee immobilizer were recommended.  Outpatient follow-up in  10 to 14 days (from date of admission) for repeat x-rays and further evaluation.   PT and OT recommended discharge to SNF.  Follow-up with TOC to assist with disposition.  Patient requested to speak with the social Investment banker, operational.  Notified Rock, case Production designer, theatre/television/film.   Probable AKI, probable CKD stage IIIa: Creatinine down from 1.40-1.02.-0.85.  She has had fluctuating creatinine and actual CKD stage status difficult to determine. Creatinine was 0.94 in May 2025.  She said she asked the nursing staff to discontinue PureWick because it was causing irritation in the vulvar area.  She prefers to use the bedside commode.    Hypokalemia: Improved   CAD s/p CABG, paroxysmal atrial fibrillation: Continue diltiazem , Eliquis , aspirin  and Lipitor    COPD: stable.  Continue bronchodilators   Comorbidities include Sjogren's syndrome on Plaquenil , gout on allopurinol , depression, mild to moderate dementia  Medically stable for discharge.  Awaiting placement to SNF.  Diet Order             Diet Heart Fluid consistency: Thin  Diet effective now                            Consultants: Orthopedic surgeon  Procedures: None    Medications:    allopurinol   100 mg Oral BID   apixaban   5 mg Oral BID   artificial tears  2 drop Both Eyes BID   aspirin  EC  81 mg Oral Daily   atorvastatin   80 mg Oral BH-q7a   cyanocobalamin   1,000 mcg Oral Daily   diltiazem   180 mg  Oral Daily   donepezil   5 mg Oral QHS   FLUoxetine   40 mg Oral Daily   hydroxychloroquine   200 mg Oral BID   lamoTRIgine   75 mg Oral BID   levothyroxine   25 mcg Oral Q0600   mirabegron  ER  25 mg Oral Daily   montelukast   10 mg Oral QHS   multivitamin with minerals  1 tablet Oral Daily   pantoprazole   40 mg Oral Daily   primidone   50 mg Oral BID   temazepam   30 mg Oral QHS   zolpidem   5 mg Oral QHS   Continuous Infusions:     Anti-infectives (From admission, onward)    Start     Dose/Rate Route  Frequency Ordered Stop   08/17/23 1000  hydroxychloroquine  (PLAQUENIL ) tablet 200 mg        200 mg Oral 2 times daily 08/16/23 2154                Family Communication/Anticipated D/C date and plan/Code Status   DVT prophylaxis: SCDs Start: 08/16/23 2152 apixaban  (ELIQUIS ) tablet 5 mg     Code Status: Full Code  Family Communication: None Disposition Plan: May need SNF at discharge   Status is: Inpatient Remains inpatient appropriate because: Left femoral condyle fracture       Subjective:   Interval events noted.  No complaints.  She feels better.  She requested that I look at her back because she has been scratching her back a lot and is worried about scratch marks.  She said she stopped using the PureWick because of irritation in the vulvar area.  Objective:    Vitals:   08/20/23 2004 08/21/23 0332 08/21/23 0825 08/21/23 1610  BP: (!) 156/58 (!) 164/63 (!) 151/51 (!) 154/58  Pulse: 65 69 68 68  Resp: 18 16 16 17   Temp: 98.3 F (36.8 C) 98 F (36.7 C) 98.4 F (36.9 C) 98 F (36.7 C)  TempSrc:   Oral Oral  SpO2: 97% 98% 92% 94%  Weight:      Height:       No data found.   Intake/Output Summary (Last 24 hours) at 08/21/2023 1651 Last data filed at 08/21/2023 1345 Gross per 24 hour  Intake 960 ml  Output 700 ml  Net 260 ml   Filed Weights   08/16/23 1814  Weight: 102.1 kg    Exam:   GEN: NAD SKIN: Warm and dry.  Few erythematous nodular lesions noted on the back (she said she has always had these spots). EYES: No pallor or icterus ENT: MMM CV: RRR PULM: CTA B ABD: soft, ND, NT, +BS CNS: AAO x 3, non focal EXT: No edema or tenderness     Data Reviewed:   I have personally reviewed following labs and imaging studies:  Labs: Labs show the following:   Basic Metabolic Panel: Recent Labs  Lab 08/16/23 2233 08/17/23 1214 08/18/23 0457 08/20/23 0404  NA 136 139 139 136  K 3.4* 4.4 3.7 4.2  CL 104 109 109 104  CO2 21* 21* 21*  22  GLUCOSE 91 86 99 106*  BUN 33* 21 19 19   CREATININE 1.40* 1.03* 1.02* 0.85  CALCIUM  8.8* 8.9 8.7* 9.1   GFR Estimated Creatinine Clearance: 70 mL/min (by C-G formula based on SCr of 0.85 mg/dL). Liver Function Tests: No results for input(s): AST, ALT, ALKPHOS, BILITOT, PROT, ALBUMIN  in the last 168 hours. No results for input(s): LIPASE, AMYLASE in the last 168 hours. No results  for input(s): AMMONIA in the last 168 hours. Coagulation profile No results for input(s): INR, PROTIME in the last 168 hours.  CBC: Recent Labs  Lab 08/16/23 2233 08/20/23 0404  WBC 8.5 6.0  NEUTROABS 5.6  --   HGB 10.6* 9.6*  HCT 32.5* 30.7*  MCV 93.7 96.5  PLT 240 235   Cardiac Enzymes: No results for input(s): CKTOTAL, CKMB, CKMBINDEX, TROPONINI in the last 168 hours. BNP (last 3 results) No results for input(s): PROBNP in the last 8760 hours. CBG: No results for input(s): GLUCAP in the last 168 hours. D-Dimer: No results for input(s): DDIMER in the last 72 hours. Hgb A1c: No results for input(s): HGBA1C in the last 72 hours. Lipid Profile: No results for input(s): CHOL, HDL, LDLCALC, TRIG, CHOLHDL, LDLDIRECT in the last 72 hours. Thyroid  function studies: No results for input(s): TSH, T4TOTAL, T3FREE, THYROIDAB in the last 72 hours.  Invalid input(s): FREET3 Anemia work up: No results for input(s): VITAMINB12, FOLATE, FERRITIN, TIBC, IRON, RETICCTPCT in the last 72 hours. Sepsis Labs: Recent Labs  Lab 08/16/23 2233 08/20/23 0404  WBC 8.5 6.0    Microbiology No results found for this or any previous visit (from the past 240 hours).  Procedures and diagnostic studies:  No results found.              LOS: 5 days   Eliyohu Class  [facility]   Pager on [email or web address]. If 7PM-7AM, please contact night-coverage at [email or web address]     08/21/2023, 4:51 PM

## 2023-08-21 NOTE — Progress Notes (Signed)
 Physical Therapy Treatment Patient Details Name: Tonya Walton MRN: [id number] DOB: 1950-02-08 Today's Date: 08/21/2023   History of Present Illness Pt is a 73 year old female presented to the ED after a fall, admitted for pain control with imaging showing a Left periprosthetic distal femur medial femoral condyle fracture      PMH significant for atrial fibrillation, coronary disease status post CABG, atrial fibrillation on anticoagulation, history of bilateral knee replacement    PT Comments  Pt received in Semi-Fowler's position and agreeable to therapy.  Pt wanting to get up, however requested therapist to show her weight bearing restrictions again, however has very minimal carryover.  Pt performed transfer into sitting with reduced assistance and also standing from seated position as well.  Pt however unable to mobilize in standing without placing weight on the L LE.  Pt then guided back to the bed and was left with all needs met.  Pt given visual demonstration one last time before exiting room.    If plan is discharge home, recommend the following: Two people to help with walking and/or transfers;Two people to help with bathing/dressing/bathroom;Assistance with cooking/housework;Assist for transportation;Help with stairs or ramp for entrance   Can travel by private vehicle     No  Equipment Recommendations  Other (comment) (TBD at next venue of care)    Recommendations for Other Services       Precautions / Restrictions Precautions Precautions: Fall Recall of Precautions/Restrictions: Impaired Required Braces or Orthoses: Knee Immobilizer - Left Knee Immobilizer - Left: On when out of bed or walking Restrictions Weight Bearing Restrictions Per Provider Order: Yes LLE Weight Bearing Per Provider Order: Non weight bearing Other Position/Activity Restrictions: in KI     Mobility  Bed Mobility Overal bed mobility: Needs Assistance Bed Mobility: Supine to Sit     Supine to  sit: Min assist, HOB elevated, Used rails     General bed mobility comments: Pt able to perform transfer to the EOB and was given education and verbal cues for weight bearing restriction.    Transfers Overall transfer level: Needs assistance Equipment used: Rolling walker (2 wheels) Transfers: Sit to/from Stand Sit to Stand: Min assist           General transfer comment: minA to come upright into standing from bed    Ambulation/Gait         Gait velocity: decreased     General Gait Details: pt unable to safely navigate furth than 12 before reporting the need to return to the bed.   Stairs             Wheelchair Mobility     Tilt Bed    Modified Rankin (Stroke Patients Only)       Balance Overall balance assessment: Needs assistance Sitting-balance support: Feet supported, No upper extremity supported Sitting balance-Leahy Scale: Good     Standing balance support: Bilateral upper extremity supported, During functional activity, Reliant on assistive device for balance Standing balance-Leahy Scale: Poor                              Communication Communication Communication: No apparent difficulties  Cognition Arousal: Alert Behavior During Therapy: WFL for tasks assessed/performed                             Following commands: Intact      Cueing Cueing Techniques: Verbal  cues  Exercises      General Comments        Pertinent Vitals/Pain Pain Assessment Pain Assessment: Faces Faces Pain Scale: Hurts a little bit Pain Location: LLE Pain Descriptors / Indicators: Discomfort Pain Intervention(s): Limited activity within patient's tolerance, Premedicated before session    Home Living                          Prior Function            PT Goals (current goals can now be found in the care plan section) Acute Rehab PT Goals Patient Stated Goal: to return to daily routine independently PT Goal  Formulation: With patient Time For Goal Achievement: 08/30/23 Potential to Achieve Goals: Good Progress towards PT goals: Progressing toward goals    Frequency    Min 2X/week      PT Plan      Co-evaluation              AM-PAC PT 6 Clicks Mobility   Outcome Measure  Help needed turning from your back to your side while in a flat bed without using bedrails?: A Little Help needed moving from lying on your back to sitting on the side of a flat bed without using bedrails?: A Little Help needed moving to and from a bed to a chair (including a wheelchair)?: A Lot Help needed standing up from a chair using your arms (e.g., wheelchair or bedside chair)?: A Little Help needed to walk in hospital room?: Total Help needed climbing 3-5 steps with a railing? : Total 6 Click Score: 13    End of Session Equipment Utilized During Treatment: Gait belt Activity Tolerance: Patient limited by pain Patient left: in bed;with call bell/phone within reach;with bed alarm set Nurse Communication: Mobility status PT Visit Diagnosis: Unsteadiness on feet (R26.81);Muscle weakness (generalized) (M62.81);Difficulty in walking, not elsewhere classified (R26.2);Pain Pain - Right/Left: Left Pain - part of body: Knee     Time:  -     Charges:                            Fonda Simpers, PT, DPT Physical Therapist - [facility]  08/21/23, 4:10 PM

## 2023-08-21 NOTE — Plan of Care (Signed)
   Problem: Education: Goal: Knowledge of General Education information will improve Description: Including pain rating scale, medication(s)/side effects and non-pharmacologic comfort measures Outcome: Progressing   Problem: Clinical Measurements: Goal: Will remain free from infection Outcome: Progressing

## 2023-08-21 NOTE — TOC Progression Note (Signed)
 Transition of Care [facility]) - Progression Note    Patient Details  Name: Tonya Walton MRN: [id number] Date of Birth: [birth year]  Transition of Care [facility]) CM/SW Contact  Marinda Cooks, RN Phone Number: 08/21/2023, 3:47 PM  Clinical Narrative:     This CM called [place] [facility] regarding pt's PASSAR status and informed it's still pending. TOC will cont to follow and update as applicable.   Expected Discharge Plan: Skilled Nursing Facility Barriers to Discharge: Continued Medical Work up               Expected Discharge Plan and Services       Living arrangements for the past 2 months: Single Family Home                                       Social Drivers of Health (SDOH) Interventions SDOH Screenings   Food Insecurity: No Food Insecurity (08/17/2023)  Housing: Unknown (08/17/2023)  Transportation Needs: No Transportation Needs (08/17/2023)  Utilities: Not At Risk (08/17/2023)  Financial Resource Strain: Low Risk  (08/01/2022)   Received from [facility]  Social Connections: Moderately Integrated (08/17/2023)  Tobacco Use: Medium Risk (08/16/2023)   Received from [facility]    Readmission Risk Interventions     No data to display

## 2023-08-22 DIAGNOSIS — S72415A Nondisplaced unspecified condyle fracture of lower end of left femur, initial encounter for closed fracture: Secondary | ICD-10-CM

## 2023-08-22 LAB — GLUCOSE, CAPILLARY: Glucose-Capillary: 152 mg/dL — ABNORMAL HIGH (ref 70–99)

## 2023-08-22 MED ORDER — NYSTATIN 100000 UNIT/GM EX CREA
TOPICAL_CREAM | Freq: Two times a day (BID) | CUTANEOUS | Status: DC
  Filled 2023-08-22: qty 30

## 2023-08-22 NOTE — Progress Notes (Signed)
 Occupational Therapy Treatment Patient Details Name: Tonya Walton MRN: [id number] DOB: [birth year] Today's Date: 08/22/2023   History of present illness Pt is a 73 year old female presented to the ED after a fall, admitted for pain control with imaging showing a Left periprosthetic distal femur medial femoral condyle fracture      PMH significant for atrial fibrillation, coronary disease status post CABG, atrial fibrillation on anticoagulation, history of bilateral knee replacement   OT comments  Pt making progress towards goals. Improved ability to perform functional STS transfers with MIN A +1. Completes UB/LB bathing from STS using RW. MAX A for posterior bathing in standing, pt tolerates 1 min while maintaining NWB in KI on LLE, bathes anterior area seated with lat leans. Discharge recommendation remains appropriate, OT will continue to follow.       If plan is discharge home, recommend the following:  A lot of help with walking and/or transfers;A lot of help with bathing/dressing/bathroom;Help with stairs or ramp for entrance;Assistance with cooking/housework;Assist for transportation   Equipment Recommendations  Other (comment)       Precautions / Restrictions Precautions Precautions: Fall Recall of Precautions/Restrictions: Impaired Required Braces or Orthoses: Knee Immobilizer - Left Knee Immobilizer - Left: On when out of bed or walking Restrictions Weight Bearing Restrictions Per Provider Order: Yes LLE Weight Bearing Per Provider Order: Non weight bearing Other Position/Activity Restrictions: in KI       Mobility Bed Mobility Overal bed mobility: Needs Assistance             General bed mobility comments: NT in recliner start and end of session    Transfers Overall transfer level: Needs assistance Equipment used: Rolling walker (2 wheels) Transfers: Sit to/from Stand Sit to Stand: Min assist           General transfer comment: improved hand placement  and ability to rise from recliner vs previous sessions     Balance Overall balance assessment: Needs assistance Sitting-balance support: Feet supported, No upper extremity supported Sitting balance-Leahy Scale: Good     Standing balance support: Bilateral upper extremity supported, During functional activity, Reliant on assistive device for balance Standing balance-Leahy Scale: Poor Standing balance comment: tolerates 1 min standing with occ minA for steadying assist for LB bathing                           ADL either performed or assessed with clinical judgement   ADL Overall ADL's : Needs assistance/impaired     Grooming: Set up;Sitting;Wash/dry face;Wash/dry Programmer, applications Details (indicate cue type and reason): in recliner in front of sink Upper Body Bathing: Set up;Sitting Upper Body Bathing Details (indicate cue type and reason): in recliner in front of sink Lower Body Bathing: Sit to/from stand;Maximal assistance;Sitting/lateral leans Lower Body Bathing Details (indicate cue type and reason): able to perform pericare anteriorlly seated, STS with maxA for posterior LB bathing from OT; pt unable to remove UE from RW due to NWB status Upper Body Dressing : Set up;Sitting Upper Body Dressing Details (indicate cue type and reason): doff and don gown Lower Body Dressing: Maximal assistance Lower Body Dressing Details (indicate cue type and reason): socks in recliner             Functional mobility during ADLs: Rolling walker (2 wheels);Cueing for sequencing;Cueing for safety;Minimal assistance General ADL Comments: completes UB/LB bathing from STS in recliner in front of sink. applied cream on back per pt request  Communication Communication Communication: No apparent difficulties   Cognition Arousal: Alert Behavior During Therapy: WFL for tasks assessed/performed Cognition: No apparent impairments                               Following  commands: Intact        Cueing   Cueing Techniques: Verbal cues        General Comments VSS, MD in room during session    Pertinent Vitals/ Pain       Pain Assessment Pain Assessment: 0-10 Pain Score: 2  Pain Location: LLE Pain Descriptors / Indicators: Discomfort Pain Intervention(s): Limited activity within patient's tolerance, Monitored during session, Repositioned         Frequency  Min 2X/week        Progress Toward Goals  OT Goals(current goals can now be found in the care plan section)  Progress towards OT goals: Progressing toward goals  Acute Rehab OT Goals OT Goal Formulation: With patient Time For Goal Achievement: 08/31/23 Potential to Achieve Goals: Good  Plan      Co-evaluation                 AM-PAC OT 6 Clicks Daily Activity     Outcome Measure   Help from another person eating meals?: None Help from another person taking care of personal grooming?: None Help from another person toileting, which includes using toliet, bedpan, or urinal?: A Lot Help from another person bathing (including washing, rinsing, drying)?: A Lot Help from another person to put on and taking off regular upper body clothing?: A Little Help from another person to put on and taking off regular lower body clothing?: A Lot 6 Click Score: 17    End of Session Equipment Utilized During Treatment: Rolling walker (2 wheels);Gait belt  OT Visit Diagnosis: Other abnormalities of gait and mobility (R26.89)   Activity Tolerance Patient tolerated treatment well   Patient Left in chair;with call bell/phone within reach;with chair alarm set   Nurse Communication Mobility status        Time: 1014-1040 OT Time Calculation (min): 26 min  Charges: OT General Charges $OT Visit: 1 Visit OT Treatments $Self Care/Home Management : 23-37 mins  Dru Laurel L. Georgetta Crafton, OTR/L  08/22/23, 12:18 PM

## 2023-08-22 NOTE — Plan of Care (Signed)
   Problem: Education: Goal: Knowledge of General Education information will improve Description Including pain rating scale, medication(s)/side effects and non-pharmacologic comfort measures Outcome: Progressing

## 2023-08-22 NOTE — Progress Notes (Signed)
 PROGRESS NOTE    Tonya Walton  FMW:[id number] DOB: February 05, 1950 DOA: 08/16/2023 PCP: Cleotilde Oneil FALCON, MD   Assessment & Plan:   Principal Problem:   Fracture of knee prosthesis, initial encounter [facility]) Active Problems:   Closed nondisplaced fracture of medial condyle of left femur (HCC)  Assessment and Plan: Closed impacted left medial femoral condyle fracture: continue w/ conservative management as per ortho surg. Will need to f/u outpatient in 10-14 days as per ortho surg. Nonweightbearing in the knee immobilzer as per ortho surg. PT/OT recs [facility]   Likely AKI on CKDIIIa: Cr is trending down from day prior. Unclear baseline. Will continue to monitor    Hypokalemia: WNL today    Hx of CAD: s/p CABG. Continue on aspirin , statin  PAF: continue on home dose of diltiazem , eliquis   COPD: w/o exacerbation. Continue on bronchodilators    Sjogren's syndrome: continue on home dose of plaquenil   Gout: continue on home dose of allopurinol   Depression: severity unknown. Continue on home dose of fluoxetine         DVT prophylaxis: eliquis  Code Status: full Family Communication:  Disposition Plan: likely d/c to SNF  Level of care: Med-Surg  Status is: Inpatient Remains inpatient appropriate because: needs SNF placement    Consultants:  Ortho surg   Procedures:   Antimicrobials:  Subjective: Pt c/o knee pain  Objective: Vitals:   08/21/23 1610 08/21/23 1935 08/22/23 0337 08/22/23 0743  BP: (!) 154/58 (!) 146/85 (!) 137/56 135/74  Pulse: 68 68 71 67  Resp: 17 18 20 17   Temp: 98 F (36.7 C) 98 F (36.7 C) 98.2 F (36.8 C) 98 F (36.7 C)  TempSrc: Oral Oral Oral Oral  SpO2: 94% 98% 94% 99%  Weight:      Height:        Intake/Output Summary (Last 24 hours) at 08/22/2023 0954 Last data filed at 08/21/2023 1345 Gross per 24 hour  Intake 480 ml  Output --  Net 480 ml   Filed Weights   08/16/23 1814  Weight: 102.1 kg    Examination:  General exam:  Appears calm and comfortable  Respiratory system: Clear to auscultation. Respiratory effort normal. Cardiovascular system: S1 & S2 +. No rubs, gallops or clicks.  Gastrointestinal system: Abdomen is obese, soft and nontender. Normal bowel sounds heard. Central nervous system: Alert and oriented. Moves all extremities Psychiatry: Judgement and insight appear normal. Mood & affect appropriate.     Data Reviewed: I have personally reviewed following labs and imaging studies  CBC: Recent Labs  Lab 08/16/23 2233 08/20/23 0404  WBC 8.5 6.0  NEUTROABS 5.6  --   HGB 10.6* 9.6*  HCT 32.5* 30.7*  MCV 93.7 96.5  PLT 240 235   Basic Metabolic Panel: Recent Labs  Lab 08/16/23 2233 08/17/23 1214 08/18/23 0457 08/20/23 0404  NA 136 139 139 136  K 3.4* 4.4 3.7 4.2  CL 104 109 109 104  CO2 21* 21* 21* 22  GLUCOSE 91 86 99 106*  BUN 33* 21 19 19   CREATININE 1.40* 1.03* 1.02* 0.85  CALCIUM  8.8* 8.9 8.7* 9.1   GFR: Estimated Creatinine Clearance: 70 mL/min (by C-G formula based on SCr of 0.85 mg/dL). Liver Function Tests: No results for input(s): AST, ALT, ALKPHOS, BILITOT, PROT, ALBUMIN  in the last 168 hours. No results for input(s): LIPASE, AMYLASE in the last 168 hours. No results for input(s): AMMONIA in the last 168 hours. Coagulation Profile: No results for input(s): INR, PROTIME in the last  168 hours. Cardiac Enzymes: No results for input(s): CKTOTAL, CKMB, CKMBINDEX, TROPONINI in the last 168 hours. BNP (last 3 results) No results for input(s): PROBNP in the last 8760 hours. HbA1C: No results for input(s): HGBA1C in the last 72 hours. CBG: No results for input(s): GLUCAP in the last 168 hours. Lipid Profile: No results for input(s): CHOL, HDL, LDLCALC, TRIG, CHOLHDL, LDLDIRECT in the last 72 hours. Thyroid  Function Tests: No results for input(s): TSH, T4TOTAL, FREET4, T3FREE, THYROIDAB in the last 72  hours. Anemia Panel: No results for input(s): VITAMINB12, FOLATE, FERRITIN, TIBC, IRON, RETICCTPCT in the last 72 hours. Sepsis Labs: No results for input(s): PROCALCITON, LATICACIDVEN in the last 168 hours.  No results found for this or any previous visit (from the past 240 hours).       Radiology Studies: No results found.      Scheduled Meds:  allopurinol   100 mg Oral BID   apixaban   5 mg Oral BID   artificial tears  2 drop Both Eyes BID   aspirin  EC  81 mg Oral Daily   atorvastatin   80 mg Oral BH-q7a   cyanocobalamin   1,000 mcg Oral Daily   diltiazem   180 mg Oral Daily   donepezil   5 mg Oral QHS   feeding supplement  237 mL Oral BID BM   FLUoxetine   40 mg Oral Daily   hydroxychloroquine   200 mg Oral BID   lamoTRIgine   75 mg Oral BID   levothyroxine   25 mcg Oral Q0600   mirabegron  ER  25 mg Oral Daily   montelukast   10 mg Oral QHS   multivitamin with minerals  1 tablet Oral Daily   pantoprazole   40 mg Oral Daily   primidone   50 mg Oral BID   temazepam   30 mg Oral QHS   zolpidem   5 mg Oral QHS   Continuous Infusions:   LOS: 6 days       Anthony CHRISTELLA Pouch, MD [facility] Pager [phone]  If 7PM-7AM, please contact night-coverage [email or web address] 08/22/2023, 9:54 AM

## 2023-08-22 NOTE — Plan of Care (Signed)

## 2023-08-23 DIAGNOSIS — T84019A Broken internal joint prosthesis, unspecified site, initial encounter: Secondary | ICD-10-CM | POA: Diagnosis not present

## 2023-08-23 LAB — CBC
HCT: 29.6 % — ABNORMAL LOW (ref 36.0–46.0)
Hemoglobin: 9.4 g/dL — ABNORMAL LOW (ref 12.0–15.0)
MCH: 29.9 pg (ref 26.0–34.0)
MCHC: 31.8 g/dL (ref 30.0–36.0)
MCV: 94.3 fL (ref 80.0–100.0)
Platelets: 268 K/uL (ref 150–400)
RBC: 3.14 MIL/uL — ABNORMAL LOW (ref 3.87–5.11)
RDW: 14.2 % (ref 11.5–15.5)
WBC: 6.5 K/uL (ref 4.0–10.5)
nRBC: 0 % (ref 0.0–0.2)

## 2023-08-23 LAB — BASIC METABOLIC PANEL WITH GFR
Anion gap: 9 (ref 5–15)
BUN: 22 mg/dL (ref 8–23)
CO2: 26 mmol/L (ref 22–32)
Calcium: 8.8 mg/dL — ABNORMAL LOW (ref 8.9–10.3)
Chloride: 107 mmol/L (ref 98–111)
Creatinine, Ser: 0.9 mg/dL (ref 0.44–1.00)
GFR, Estimated: >60 mL/min (ref >60)
Glucose, Bld: 98 mg/dL (ref 70–99)
Potassium: 3.6 mmol/L (ref 3.5–5.1)
Sodium: 142 mmol/L (ref 135–145)

## 2023-08-23 MED ORDER — ASPIRIN EC 81 MG PO TBEC: 81.0000 mg | DELAYED_RELEASE_TABLET | Freq: Every day | ORAL | Status: AC

## 2023-08-23 MED ORDER — APIXABAN 5 MG PO TABS: 5.0000 mg | ORAL_TABLET | Freq: Two times a day (BID) | ORAL | 0 refills | Status: AC

## 2023-08-23 MED ORDER — HYDROCODONE-ACETAMINOPHEN 5-325 MG PO TABS: 1.0000 | ORAL_TABLET | Freq: Four times a day (QID) | ORAL | 0 refills | Status: AC | PRN

## 2023-08-23 NOTE — TOC Progression Note (Addendum)
 Transition of Care [facility]) - Progression Note    Patient Details  Name: KAYANNA MCKILLOP MRN: [id number] Date of Birth: 1950-03-30  Transition of Care [facility]) CM/SW Contact  Elouise LULLA Capri, RN 08/23/2023, 10:20 AM  Clinical Narrative:     CM returned call to patient's room regarding SNF preferences, [facility] and [facility]. Per patient, will prefer [facility]. CM call to Tammy, Admissions, [facility], phone: [phone] regarding bed availability. Per Tammy, SNF extending bed offer and CM can initiate auth. CM alert to [employer], CMA and Rojelio RAMAN, CMA regarding initiating auth for [facility].    CM returned call to [place], Admissions, [facility], phone: [phone]. Per Tammy, SNF auth approved, shara ID is [id number] and patient can admit to SNF room 605B. CM alert to Dr. Trudy and RN Mary-Catherine.   CM follow up in [place] [facility]. Per [place] [facility], request for history and physical. CM uploaded history and physical to [place] [facility]. CM will follow up. CM call placed to Tammy, Admissions, [facility] regarding awaiting PASSR.   CM follow up in [place] [facility], PASSR approved, [id number], expires 09/22/2023. CM call to Tammy, Admissions, [facility] regarding pending discharge and admission on tomorrow morning at 1030. CM call to [employer], phone: [phone], BLS transport scheduled for 1030 on 08/24/2023. Expected Discharge Plan: Skilled Nursing Facility Barriers to Discharge: Continued Medical Work up    Expected Discharge Plan and Services    SNF   Living arrangements for the past 2 months: Single Family Home     Social Drivers of Health (SDOH) Interventions SDOH Screenings   Food Insecurity: No Food Insecurity (08/17/2023)  Housing: Unknown (08/17/2023)  Transportation Needs: No Transportation Needs (08/17/2023)  Utilities: Not At Risk (08/17/2023)  Financial Resource Strain: Low Risk   (08/01/2022)   Received from [facility]  Social Connections: Moderately Integrated (08/17/2023)  Tobacco Use: Medium Risk (08/16/2023)   Received from [facility]    Readmission Risk Interventions     No data to display

## 2023-08-23 NOTE — Progress Notes (Signed)
 Occupational Therapy Treatment Patient Details Name: Tonya Walton MRN: [id number] DOB: 12/27/1950 Today's Date: 08/23/2023   History of present illness Pt is a 73 year old female presented to the ED after a fall, admitted for pain control with imaging showing a Left periprosthetic distal femur medial femoral condyle fracture      PMH significant for atrial fibrillation, coronary disease status post CABG, atrial fibrillation on anticoagulation, history of bilateral knee replacement   OT comments  Tonya Walton seen for OT treatment on this date. Upon arrival to room pt in bed, agreeable to tx. Pt requires SUPERVISION for supine>sit; CGA + RW for STS at EOB. Pt took ~10 steps around EOB, adhering to NWB in LLE t/o before fatiguing and returning to bed, requiring MAX A +2 physical assist for sit>supine. KI removed to check skin integrity and readjusted. Pt making good progress toward goals, will continue to follow POC. Discharge recommendation remains appropriate.        If plan is discharge home, recommend the following:  A lot of help with walking and/or transfers;A lot of help with bathing/dressing/bathroom;Help with stairs or ramp for entrance;Assistance with cooking/housework;Assist for transportation   Equipment Recommendations  Other (comment)    Recommendations for Other Services      Precautions / Restrictions Precautions Precautions: Fall Recall of Precautions/Restrictions: Intact Restrictions Weight Bearing Restrictions Per Provider Order: Yes LLE Weight Bearing Per Provider Order: Non weight bearing       Mobility Bed Mobility Overal bed mobility: Needs Assistance Bed Mobility: Supine to Sit, Sit to Supine     Supine to sit: Supervision, HOB elevated, Used rails Sit to supine: +2 for physical assistance, Max assist        Transfers Overall transfer level: Needs assistance Equipment used: Rolling walker (2 wheels) Transfers: Sit to/from Stand Sit to Stand:  Contact guard assist                 Balance Overall balance assessment: Needs assistance Sitting-balance support: Feet supported Sitting balance-Leahy Scale: Good     Standing balance support: Bilateral upper extremity supported, Reliant on assistive device for balance Standing balance-Leahy Scale: Poor                             ADL either performed or assessed with clinical judgement   ADL                                         General ADL Comments: MAX A for KI adjustment    Extremity/Trunk Assessment Upper Extremity Assessment Upper Extremity Assessment: Generalized weakness   Lower Extremity Assessment Lower Extremity Assessment: Generalized weakness;LLE deficits/detail LLE Deficits / Details: in KI throughout LLE: Unable to fully assess due to immobilization        Vision       Perception     Praxis     Communication Communication Communication: No apparent difficulties   Cognition Arousal: Alert Behavior During Therapy: WFL for tasks assessed/performed Cognition: No apparent impairments                               Following commands: Intact        Cueing   Cueing Techniques: Verbal cues  Exercises      Shoulder Instructions  General Comments      Pertinent Vitals/ Pain       Pain Assessment Pain Assessment: 0-10 Pain Score: 2  Pain Location: LLE Pain Descriptors / Indicators: Discomfort Pain Intervention(s): Limited activity within patient's tolerance, Monitored during session, Repositioned  Home Living                                          Prior Functioning/Environment              Frequency  Min 2X/week        Progress Toward Goals  OT Goals(current goals can now be found in the care plan section)  Progress towards OT goals: Progressing toward goals  Acute Rehab OT Goals Patient Stated Goal: to go to rehab OT Goal Formulation: With  patient Time For Goal Achievement: 09/06/23 Potential to Achieve Goals: Good ADL Goals Pt Will Perform Grooming: with modified independence;sitting Pt Will Perform Lower Body Dressing: with modified independence;sitting/lateral leans;sit to/from stand Pt Will Transfer to Toilet: with modified independence Pt Will Perform Toileting - Clothing Manipulation and hygiene: with modified independence;sitting/lateral leans;sit to/from stand  Plan      Co-evaluation                 AM-PAC OT 6 Clicks Daily Activity     Outcome Measure   Help from another person eating meals?: None Help from another person taking care of personal grooming?: None Help from another person toileting, which includes using toliet, bedpan, or urinal?: A Lot Help from another person bathing (including washing, rinsing, drying)?: A Lot Help from another person to put on and taking off regular upper body clothing?: A Little Help from another person to put on and taking off regular lower body clothing?: A Lot 6 Click Score: 17    End of Session Equipment Utilized During Treatment: Rolling walker (2 wheels)  OT Visit Diagnosis: Other abnormalities of gait and mobility (R26.89)   Activity Tolerance Patient tolerated treatment well   Patient Left in bed;with call bell/phone within reach;with bed alarm set   Nurse Communication          Time: 8563-8540 OT Time Calculation (min): 23 min  Charges: OT General Charges $OT Visit: 1 Visit OT Treatments $Therapeutic Activity: 23-37 mins  Tonya Walton, Student OT   [employer] 08/23/2023, 3:45 PM

## 2023-08-23 NOTE — Discharge Summary (Addendum)
 Physician Discharge Summary  Tonya Walton FMW:[id number] DOB: September 26, 1950 DOA: 08/16/2023  PCP: Cleotilde Oneil FALCON, MD  Admit date: 08/16/2023 Discharge date: 08/24/23  Admitted From: home  Disposition:  SNF  Recommendations for Outpatient Follow-up:  Follow up with PCP in 1-2 weeks F/u w/ ortho surg, Dr. Lorelle, in 10-14 days   Home Health: no  Equipment/Devices:  Discharge Condition: stable  CODE STATUS: full  Diet recommendation: Regular  Brief/Interim Summary: HPI was taken from Dr. Marca: Tonya Walton is a 73 y.o. female with medical history significant of atrial fibrillation, coronary disease status post CABG, atrial fibrillation on anticoagulation, history of bilateral knee replacement.  Patient had been in her usual state of health and was walking in her backyard today when she slipped on a watery surface and fell.  She developed sudden onset of left knee joint pain.  Pain was so severe and she has been unable to ambulate on her feet since event.  Patient was brought into the emergency room to be further assessed.   Initial x-ray did reveal impacted fracture of the left medial condyle.  CT scan was requested by orthopedics with findings as noted below  Status post knee replacement with normal alignment. Subtle cortical step-off at the medial metaphyseal femoral cortex above the prosthesis suspicious for slightly impacted nondisplaced fracture. 2. Moderate knee effusion with small hematocrit level.   Due to significant pain, patient is unable to ambulate.  Patient was referred to hospitalist service for optimization of pain management and possible assessment for acute rehab placement.  Discharge Diagnoses:  Principal Problem:   Fracture of knee prosthesis, initial encounter [facility]) Active Problems:   Closed nondisplaced fracture of medial condyle of left femur (HCC)  Closed impacted left medial femoral condyle fracture: continue w/ conservative management as per ortho  surg. Will need to f/u outpatient in 10-14 days as per ortho surg. Nonweightbearing in the knee immobilzer as per ortho surg. PT/OT recs SNF   Likely AKI on CKDIIIa: Cr is trending down from day prior. Unclear baseline. Will continue to monitor    Hypokalemia: WNL today    Hx of CAD: s/p CABG. Continue on aspirin , statin  PAF: continue on home dose of diltiazem , eliquis   COPD: w/o exacerbation. Continue on bronchodilators    Sjogren's syndrome: continue on home dose of plaquenil   Gout: continue on home dose of allopurinol   Depression: severity unknown. Continue on home dose of fluoxetine   Discharge Instructions  Discharge Instructions     Diet general   Complete by: As directed    Discharge instructions   Complete by: As directed    F/u w/ PCP in 1-2 weeks. F/u w/ ortho surg, Dr. Lorelle, in 10-14 days. Nonweightbearing in the knee immobilzer as per ortho surg.   Increase activity slowly   Complete by: As directed       Allergies as of 08/23/2023   No Known Allergies      Medication List     STOP taking these medications    meloxicam 7.5 MG tablet Commonly known as: MOBIC       TAKE these medications    allopurinol  100 MG tablet Commonly known as: ZYLOPRIM  Take 100 mg by mouth 2 (two) times daily.   apixaban  5 MG Tabs tablet Commonly known as: ELIQUIS  Take 1 tablet (5 mg total) by mouth 2 (two) times daily.   aspirin  EC 81 MG tablet Take 1 tablet (81 mg total) by mouth daily.   atorvastatin  80 MG tablet  Commonly known as: LIPITOR  Take 80 mg by mouth every morning.   carboxymethylcellul-glycerin  0.5-0.9 % ophthalmic solution Commonly known as: REFRESH OPTIVE Place 2 drops into both eyes 2 (two) times daily.   colchicine 0.6 MG tablet Take 0.6 mg by mouth 2 (two) times daily as needed (gout).   cyanocobalamin  1000 MCG tablet Commonly known as: VITAMIN B12 Take 1,000 mcg by mouth daily.   diltiazem  180 MG 24 hr capsule Commonly known as:  CARDIZEM  CD Take 180 mg by mouth daily.   donepezil  10 MG tablet Commonly known as: ARICEPT  Take 10 mg by mouth at bedtime.   FLUoxetine  40 MG capsule Commonly known as: PROZAC  Take 40 mg by mouth daily.   HYDROcodone -acetaminophen  5-325 MG tablet Commonly known as: NORCO/VICODIN Take 1 tablet by mouth every 6 (six) hours as needed for up to 2 days for moderate pain (pain score 4-6) or severe pain (pain score 7-10).   hydroxychloroquine  200 MG tablet Commonly known as: PLAQUENIL  Take 200 mg by mouth 2 (two) times daily.   lamoTRIgine  25 MG tablet Commonly known as: LAMICTAL  TAKE 2 TABLETS BY MOUTH EVERY DAY What changed:  how much to take when to take this   levothyroxine  25 MCG tablet Commonly known as: SYNTHROID  Take 25 mcg by mouth daily before breakfast.   montelukast  10 MG tablet Commonly known as: SINGULAIR  Take 10 mg by mouth at bedtime.   nitroGLYCERIN  0.4 MG SL tablet Commonly known as: NITROSTAT  Place 0.4 mg under the tongue every 5 (five) minutes as needed for chest pain.   ondansetron  4 MG tablet Commonly known as: Zofran  Take 1 tablet (4 mg total) by mouth every 8 (eight) hours as needed.   OVER THE COUNTER MEDICATION Take 2 each by mouth daily. D-MANNOSE WITH CRANBERRY   primidone  50 MG tablet Commonly known as: MYSOLINE  Take 50 mg by mouth 2 (two) times daily.   temazepam  15 MG capsule Commonly known as: RESTORIL  Take 30 mg by mouth at bedtime.   Vitamin D3 50 MCG (2000 UT) capsule Take 2,000 Units by mouth daily.   XYLIMELTS MT Use as directed 1 each in the mouth or throat 2 (two) times daily in the am and at bedtime..   zolpidem  5 MG tablet Commonly known as: AMBIEN  Take 5 mg by mouth at bedtime.        Contact information for after-discharge care     Destination     [facility] [place], [place]. [place]   Service: Skilled Nursing Contact information: [address] [place]  [address] [phone]                     No Known Allergies  Consultations: Ortho surg    Procedures/Studies: CT Knee Left Wo Contrast Result Date: 08/16/2023 CLINICAL DATA:  Mechanical fall EXAM: CT OF THE LEFT KNEE WITHOUT CONTRAST TECHNIQUE: Multidetector CT imaging of the left knee was performed according to the standard protocol. Multiplanar CT image reconstructions were also generated. RADIATION DOSE REDUCTION: This exam was performed according to the departmental dose-optimization program which includes automated exposure control, adjustment of the mA and/or kV according to patient size and/or use of iterative reconstruction technique. COMPARISON:  Radiographs 08/16/2023 FINDINGS: Bones/Joint/Cartilage Moderate knee effusion, small hematocrit level on series 3, image 43. Status post knee replacement with normal alignment. Artifact limits assessment of bony detail. Subtle cortical step-off at the medial metaphyseal femoral cortex, coronal series 9, image 62 through 65 with subtle anterior to posterior lucency on  axial series 5 image 61. Caudal extent of lucency difficult to evaluate due to extensive artifact from the hardware, lucency may extend to the prosthesis anteriorly, coronal series 9, image 51. No other discrete fractures are visualized. Ligaments Suboptimally assessed by CT. Muscles and Tendons No intramuscular fluid collection. No significant atrophy normal contour of quadriceps and patellar tendons Soft tissues Negative IMPRESSION: 1. Status post knee replacement with normal alignment. Artifact limits assessment of bony detail. Subtle cortical step-off at the medial metaphyseal femoral cortex above the prosthesis suspicious for slightly impacted nondisplaced fracture. Caudal extent of lucency difficult to evaluate due to extensive artifact from the hardware, fracture lucency may extend to prosthetic surface anteriorly on coronal views. 2. Moderate knee effusion with small hematocrit level. Electronically Signed   By:  Luke Bun M.D.   On: 08/16/2023 20:17   DG Knee Complete 4 Views Left Result Date: 08/16/2023 CLINICAL DATA:  Fall EXAM: LEFT KNEE - COMPLETE 4+ VIEW COMPARISON:  09/02/2014 FINDINGS: Knee replacement with normal alignment. Acute slightly impacted fracture at the medial femoral condyle on oblique view. Moderate knee effusion. IMPRESSION: Knee replacement with acute slightly impacted fracture at the medial femoral condyle. Moderate knee effusion. Electronically Signed   By: Luke Bun M.D.   On: 08/16/2023 18:56   (Echo, Carotid, EGD, Colonoscopy, ERCP)    Subjective: Pt c/o fatigue    Discharge Exam: Vitals:   08/23/23 0613 08/23/23 0806  BP: (!) 153/63 (!) 160/64  Pulse: 67 76  Resp: 18 17  Temp: 98.4 F (36.9 C) 98.2 F (36.8 C)  SpO2: 96% 97%   Vitals:   08/22/23 2121 08/23/23 0500 08/23/23 0613 08/23/23 0806  BP: (!) 148/53  (!) 153/63 (!) 160/64  Pulse: 71  67 76  Resp: 17  18 17   Temp: 97.7 F (36.5 C)  98.4 F (36.9 C) 98.2 F (36.8 C)  TempSrc:   Oral Oral  SpO2: 97%  96% 97%  Weight:  104.2 kg    Height:        General: Pt is alert, awake, not in acute distress Cardiovascular: S1/S2 +, no rubs, no gallops Respiratory: CTA bilaterally, no wheezing, no rhonchi Abdominal: Soft, NT, obese, bowel sounds + Extremities:  no cyanosis    The results of significant diagnostics from this hospitalization (including imaging, microbiology, ancillary and laboratory) are listed below for reference.     Microbiology: No results found for this or any previous visit (from the past 240 hours).   Labs: BNP (last 3 results) Recent Labs    06/05/23 0828  BNP 71.1   Basic Metabolic Panel: Recent Labs  Lab 08/16/23 2233 08/17/23 1214 08/18/23 0457 08/20/23 0404 08/23/23 0315  NA 136 139 139 136 142  K 3.4* 4.4 3.7 4.2 3.6  CL 104 109 109 104 107  CO2 21* 21* 21* 22 26  GLUCOSE 91 86 99 106* 98  BUN 33* 21 19 19 22   CREATININE 1.40* 1.03* 1.02* 0.85 0.90   CALCIUM  8.8* 8.9 8.7* 9.1 8.8*   Liver Function Tests: No results for input(s): AST, ALT, ALKPHOS, BILITOT, PROT, ALBUMIN  in the last 168 hours. No results for input(s): LIPASE, AMYLASE in the last 168 hours. No results for input(s): AMMONIA in the last 168 hours. CBC: Recent Labs  Lab 08/16/23 2233 08/20/23 0404 08/23/23 0315  WBC 8.5 6.0 6.5  NEUTROABS 5.6  --   --   HGB 10.6* 9.6* 9.4*  HCT 32.5* 30.7* 29.6*  MCV 93.7 96.5 94.3  PLT 240 235 268   Cardiac Enzymes: No results for input(s): CKTOTAL, CKMB, CKMBINDEX, TROPONINI in the last 168 hours. BNP: Invalid input(s): POCBNP CBG: Recent Labs  Lab 08/22/23 2051  GLUCAP 152*   D-Dimer No results for input(s): DDIMER in the last 72 hours. Hgb A1c No results for input(s): HGBA1C in the last 72 hours. Lipid Profile No results for input(s): CHOL, HDL, LDLCALC, TRIG, CHOLHDL, LDLDIRECT in the last 72 hours. Thyroid  function studies No results for input(s): TSH, T4TOTAL, T3FREE, THYROIDAB in the last 72 hours.  Invalid input(s): FREET3 Anemia work up No results for input(s): VITAMINB12, FOLATE, FERRITIN, TIBC, IRON, RETICCTPCT in the last 72 hours. Urinalysis    Component Value Date/Time   COLORURINE YELLOW 12/06/2020 1107   APPEARANCEUR Clear 07/20/2022 1521   LABSPEC >1.030 (H) 12/06/2020 1107   LABSPEC 1.024 05/27/2014 0827   PHURINE 6.0 12/06/2020 1107   GLUCOSEU Trace (A) 07/20/2022 1521   GLUCOSEU Negative 05/27/2014 0827   HGBUR NEGATIVE 12/06/2020 1107   BILIRUBINUR Negative 07/20/2022 1521   BILIRUBINUR Negative 05/27/2014 0827   KETONESUR TRACE (A) 12/06/2020 1107   PROTEINUR 3+ (A) 07/20/2022 1521   PROTEINUR 30 (A) 12/06/2020 1107   NITRITE Negative 07/20/2022 1521   NITRITE POSITIVE (A) 12/06/2020 1107   LEUKOCYTESUR Negative 07/20/2022 1521   LEUKOCYTESUR SMALL (A) 12/06/2020 1107   LEUKOCYTESUR Negative 05/27/2014 0827   Sepsis  Labs Recent Labs  Lab 08/16/23 2233 08/20/23 0404 08/23/23 0315  WBC 8.5 6.0 6.5   Microbiology No results found for this or any previous visit (from the past 240 hours).   Time coordinating discharge: 33 minutes  SIGNED:   Anthony CHRISTELLA Pouch, MD  [facility] 08/23/2023, 2:42 PM Pager   If 7PM-7AM, please contact night-coverage [email or web address]

## 2023-08-23 NOTE — Plan of Care (Signed)
  Problem: Clinical Measurements: Goal: Ability to maintain clinical measurements within normal limits will improve Outcome: Progressing Goal: Will remain free from infection Outcome: Progressing   Problem: Activity: Goal: Risk for activity intolerance will decrease Outcome: Progressing   Problem: Nutrition: Goal: Adequate nutrition will be maintained Outcome: Progressing   Problem: Coping: Goal: Level of anxiety will decrease Outcome: Progressing   Problem: Elimination: Goal: Will not experience complications related to bowel motility Outcome: Progressing Goal: Will not experience complications related to urinary retention Outcome: Progressing   Problem: Pain Managment: Goal: General experience of comfort will improve and/or be controlled Outcome: Progressing   Problem: Skin Integrity: Goal: Risk for impaired skin integrity will decrease Outcome: Progressing

## 2023-08-23 NOTE — Plan of Care (Signed)

## 2023-08-24 LAB — BASIC METABOLIC PANEL WITH GFR
Anion gap: 10 (ref 5–15)
BUN: 23 mg/dL (ref 8–23)
CO2: 26 mmol/L (ref 22–32)
Calcium: 8.8 mg/dL — ABNORMAL LOW (ref 8.9–10.3)
Chloride: 107 mmol/L (ref 98–111)
Creatinine, Ser: 0.85 mg/dL (ref 0.44–1.00)
GFR, Estimated: >60 mL/min (ref >60)
Glucose, Bld: 109 mg/dL — ABNORMAL HIGH (ref 70–99)
Potassium: 3.7 mmol/L (ref 3.5–5.1)
Sodium: 143 mmol/L (ref 135–145)

## 2023-08-24 LAB — CBC
HCT: 29.4 % — ABNORMAL LOW (ref 36.0–46.0)
Hemoglobin: 9.5 g/dL — ABNORMAL LOW (ref 12.0–15.0)
MCH: 30.4 pg (ref 26.0–34.0)
MCHC: 32.3 g/dL (ref 30.0–36.0)
MCV: 94.2 fL (ref 80.0–100.0)
Platelets: 271 K/uL (ref 150–400)
RBC: 3.12 MIL/uL — ABNORMAL LOW (ref 3.87–5.11)
RDW: 14.1 % (ref 11.5–15.5)
WBC: 6.4 K/uL (ref 4.0–10.5)
nRBC: 0 % (ref 0.0–0.2)

## 2023-08-24 NOTE — TOC Transition Note (Addendum)
 Transition of Care [facility]) - Discharge Note   Patient Details  Name: Tonya Walton MRN: [id number] Date of Birth: 1950-11-30  [facility] CM/SW Contact:  Elouise LULLA Capri, RN 08/24/2023, 9:52 AM   Clinical Narrative:      Discharge orders noted. CM awaiting discharge summary for [facility] [place]. CM call to [employer], BLS transport confirmed for 1030 today.  Discharge summary noted and faxed to [facility].  Final next level of care: Skilled Nursing Facility Barriers to Discharge: No Barriers Identified   Patient Goals and CMS Choice    SNF  Discharge Placement     SNF          Discharge Plan and Services Additional resources added to the After Visit Summary for       Social Drivers of Health (SDOH) Interventions SDOH Screenings   Food Insecurity: No Food Insecurity (08/17/2023)  Housing: Unknown (08/17/2023)  Transportation Needs: No Transportation Needs (08/17/2023)  Utilities: Not At Risk (08/17/2023)  Financial Resource Strain: Low Risk  (08/01/2022)   [facility]  Social Connections: Moderately Integrated (08/17/2023)  Tobacco Use: Medium Risk (08/16/2023)   [facility]     Readmission Risk Interventions     No data to display

## 2023-08-24 NOTE — Discharge Summary (Signed)
 Physician Discharge Summary  Tonya Walton FMW:[id number] DOB: 1950/12/24 DOA: 08/16/2023   PCP: Tonya Walton FALCON, MD   Admit date: 08/16/2023 Discharge date: 08/24/23   Admitted From: home  Disposition:  SNF   Recommendations for Outpatient Follow-up:  Follow up with PCP in 1-2 weeks F/u w/ ortho surg, Tonya Walton, in 10-14 days    Home Health: no  Equipment/Devices:   Discharge Condition: stable  CODE STATUS: full  Diet recommendation: Regular   Brief/Interim Summary: HPI was taken from Dr. Marca: Tonya Walton is a 73 y.o. female with medical history significant of atrial fibrillation, coronary disease status post CABG, atrial fibrillation on anticoagulation, history of bilateral knee replacement.  Patient had been in her usual state of health and was walking in her backyard today when she slipped on a watery surface and fell.  She developed sudden onset of left knee joint pain.  Pain was so severe and she has been unable to ambulate on her feet since event.  Patient was brought into the emergency room to be further assessed.   Initial x-ray did reveal impacted fracture of the left medial condyle.  CT scan was requested by orthopedics with findings as noted below  Status post knee replacement with normal alignment. Subtle cortical step-off at the medial metaphyseal femoral cortex above the prosthesis suspicious for slightly impacted nondisplaced fracture. 2. Moderate knee effusion with small hematocrit level.   Due to significant pain, patient is unable to ambulate.  Patient was referred to hospitalist service for optimization of pain management and possible assessment for acute rehab placement.   Discharge Diagnoses:  Principal Problem:   Fracture of knee prosthesis, initial encounter [facility]) Active Problems:   Closed nondisplaced fracture of medial condyle of left femur (HCC)   Closed impacted left medial femoral condyle fracture: continue w/ conservative management as per  ortho surg. Will need to f/u outpatient in 10-14 days as per ortho surg. Nonweightbearing in the knee immobilzer as per ortho surg. PT/OT recs SNF   Likely AKI on CKDIIIa: Cr is trending down from day prior. Unclear baseline. Will continue to monitor    Hypokalemia: WNL today    Hx of CAD: s/p CABG. Continue on aspirin , statin   PAF: continue on home dose of diltiazem , eliquis    COPD: w/o exacerbation. Continue on bronchodilators    Sjogren's syndrome: continue on home dose of plaquenil    Gout: continue on home dose of allopurinol    Depression: severity unknown. Continue on home dose of fluoxetine    Discharge Instructions   Discharge Instructions       Diet general   Complete by: As directed      Discharge instructions   Complete by: As directed      F/u w/ PCP in 1-2 weeks. F/u w/ ortho surg, Tonya Walton, in 10-14 days. Nonweightbearing in the knee immobilzer as per ortho surg.    Increase activity slowly   Complete by: As directed           Allergies as of 08/23/2023   No Known Allergies         Medication List       STOP taking these medications     meloxicam 7.5 MG tablet Commonly known as: MOBIC           TAKE these medications     allopurinol  100 MG tablet Commonly known as: ZYLOPRIM  Take 100 mg by mouth 2 (two) times daily.    apixaban  5 MG Tabs tablet  Commonly known as: ELIQUIS  Take 1 tablet (5 mg total) by mouth 2 (two) times daily.    aspirin  EC 81 MG tablet Take 1 tablet (81 mg total) by mouth daily.    atorvastatin  80 MG tablet Commonly known as: LIPITOR  Take 80 mg by mouth every morning.    carboxymethylcellul-glycerin  0.5-0.9 % ophthalmic solution Commonly known as: REFRESH OPTIVE Place 2 drops into both eyes 2 (two) times daily.    colchicine 0.6 MG tablet Take 0.6 mg by mouth 2 (two) times daily as needed (gout).    cyanocobalamin  1000 MCG tablet Commonly known as: VITAMIN B12 Take 1,000 mcg by mouth daily.    diltiazem  180 MG  24 hr capsule Commonly known as: CARDIZEM  CD Take 180 mg by mouth daily.    donepezil  10 MG tablet Commonly known as: ARICEPT  Take 10 mg by mouth at bedtime.    FLUoxetine  40 MG capsule Commonly known as: PROZAC  Take 40 mg by mouth daily.    HYDROcodone -acetaminophen  5-325 MG tablet Commonly known as: NORCO/VICODIN Take 1 tablet by mouth every 6 (six) hours as needed for up to 2 days for moderate pain (pain score 4-6) or severe pain (pain score 7-10).    hydroxychloroquine  200 MG tablet Commonly known as: PLAQUENIL  Take 200 mg by mouth 2 (two) times daily.    lamoTRIgine  25 MG tablet Commonly known as: LAMICTAL  TAKE 2 TABLETS BY MOUTH EVERY DAY What changed:  how much to take when to take this    levothyroxine  25 MCG tablet Commonly known as: SYNTHROID  Take 25 mcg by mouth daily before breakfast.    montelukast  10 MG tablet Commonly known as: SINGULAIR  Take 10 mg by mouth at bedtime.    nitroGLYCERIN  0.4 MG SL tablet Commonly known as: NITROSTAT  Place 0.4 mg under the tongue every 5 (five) minutes as needed for chest pain.    ondansetron  4 MG tablet Commonly known as: Zofran  Take 1 tablet (4 mg total) by mouth every 8 (eight) hours as needed.    OVER THE COUNTER MEDICATION Take 2 each by mouth daily. D-MANNOSE WITH CRANBERRY    primidone  50 MG tablet Commonly known as: MYSOLINE  Take 50 mg by mouth 2 (two) times daily.    temazepam  15 MG capsule Commonly known as: RESTORIL  Take 30 mg by mouth at bedtime.    Vitamin D3 50 MCG (2000 UT) capsule Take 2,000 Units by mouth daily.    XYLIMELTS MT Use as directed 1 each in the mouth or throat 2 (two) times daily in the am and at bedtime..    zolpidem  5 MG tablet Commonly known as: AMBIEN  Take 5 mg by mouth at bedtime.             Contact information for after-discharge care       Destination       [facility] [place], [place]. [place]   Service: Skilled Nursing Contact information: [address] [place]  [address] [phone]                             Allergies  No Known Allergies     Consultations: Ortho surg      Procedures/Studies: Imaging Results  CT Knee Left Wo Contrast Result Date: 08/16/2023 CLINICAL DATA:  Mechanical fall EXAM: CT OF THE LEFT KNEE WITHOUT CONTRAST TECHNIQUE: Multidetector CT imaging of the left knee was performed according to the standard protocol. Multiplanar CT image reconstructions were also generated.  RADIATION DOSE REDUCTION: This exam was performed according to the departmental dose-optimization program which includes automated exposure control, adjustment of the mA and/or kV according to patient size and/or use of iterative reconstruction technique. COMPARISON:  Radiographs 08/16/2023 FINDINGS: Bones/Joint/Cartilage Moderate knee effusion, small hematocrit level on series 3, image 43. Status post knee replacement with normal alignment. Artifact limits assessment of bony detail. Subtle cortical step-off at the medial metaphyseal femoral cortex, coronal series 9, image 62 through 65 with subtle anterior to posterior lucency on axial series 5 image 61. Caudal extent of lucency difficult to evaluate due to extensive artifact from the hardware, lucency may extend to the prosthesis anteriorly, coronal series 9, image 51. No other discrete fractures are visualized. Ligaments Suboptimally assessed by CT. Muscles and Tendons No intramuscular fluid collection. No significant atrophy normal contour of quadriceps and patellar tendons Soft tissues Negative IMPRESSION: 1. Status post knee replacement with normal alignment. Artifact limits assessment of bony detail. Subtle cortical step-off at the medial metaphyseal femoral cortex above the prosthesis suspicious for slightly impacted nondisplaced fracture. Caudal extent of lucency difficult to evaluate due to extensive artifact from the hardware, fracture lucency may extend to prosthetic surface  anteriorly on coronal views. 2. Moderate knee effusion with small hematocrit level. Electronically Signed   By: Luke Bun M.D.   On: 08/16/2023 20:17    DG Knee Complete 4 Views Left Result Date: 08/16/2023 CLINICAL DATA:  Fall EXAM: LEFT KNEE - COMPLETE 4+ VIEW COMPARISON:  09/02/2014 FINDINGS: Knee replacement with normal alignment. Acute slightly impacted fracture at the medial femoral condyle on oblique view. Moderate knee effusion. IMPRESSION: Knee replacement with acute slightly impacted fracture at the medial femoral condyle. Moderate knee effusion. Electronically Signed   By: Luke Bun M.D.   On: 08/16/2023 18:56      (Echo, Carotid, EGD, Colonoscopy, ERCP)      Subjective: Pt c/o fatigue      Discharge Exam:     Vitals:    08/23/23 0613 08/23/23 0806  BP: (!) 153/63 (!) 160/64  Pulse: 67 76  Resp: 18 17  Temp: 98.4 F (36.9 C) 98.2 F (36.8 C)  SpO2: 96% 97%          Vitals:    08/22/23 2121 08/23/23 0500 08/23/23 0613 08/23/23 0806  BP: (!) 148/53   (!) 153/63 (!) 160/64  Pulse: 71   67 76  Resp: 17   18 17   Temp: 97.7 F (36.5 C)   98.4 F (36.9 C) 98.2 F (36.8 C)  TempSrc:     Oral Oral  SpO2: 97%   96% 97%  Weight:   104.2 kg      Height:              General: Pt is alert, awake, not in acute distress Cardiovascular: S1/S2 +, no rubs, no gallops Respiratory: CTA bilaterally, no wheezing, no rhonchi Abdominal: Soft, NT, obese, bowel sounds + Extremities:  no cyanosis        The results of significant diagnostics from this hospitalization (including imaging, microbiology, ancillary and laboratory) are listed below for reference.       Microbiology: No results found for this or any previous visit (from the past 240 hours).    Labs: BNP (last 3 results) Recent Labs (within last 365 days)     Recent Labs    06/05/23 0828  BNP 71.1      Basic Metabolic Panel: Last Labs  Recent Labs  Lab 08/16/23 2233 08/17/23 1214  08/18/23 0457 08/20/23 0404 08/23/23 0315  NA 136 139 139 136 142  K 3.4* 4.4 3.7 4.2 3.6  CL 104 109 109 104 107  CO2 21* 21* 21* 22 26  GLUCOSE 91 86 99 106* 98  BUN 33* 21 19 19 22   CREATININE 1.40* 1.03* 1.02* 0.85 0.90  CALCIUM  8.8* 8.9 8.7* 9.1 8.8*      Liver Function Tests: Last Labs  No results for input(s): AST, ALT, ALKPHOS, BILITOT, PROT, ALBUMIN  in the last 168 hours.   Last Labs  No results for input(s): LIPASE, AMYLASE in the last 168 hours.   Last Labs  No results for input(s): AMMONIA in the last 168 hours.   CBC: Last Labs       Recent Labs  Lab 08/16/23 2233 08/20/23 0404 08/23/23 0315  WBC 8.5 6.0 6.5  NEUTROABS 5.6  --   --   HGB 10.6* 9.6* 9.4*  HCT 32.5* 30.7* 29.6*  MCV 93.7 96.5 94.3  PLT 240 235 268      Cardiac Enzymes: Last Labs  No results for input(s): CKTOTAL, CKMB, CKMBINDEX, TROPONINI in the last 168 hours.   BNP: Last Labs  Invalid input(s): POCBNP   CBG: Last Labs     Recent Labs  Lab 08/22/23 2051  GLUCAP 152*      D-Dimer Recent Labs (last 2 labs)  No results for input(s): DDIMER in the last 72 hours.   Hgb A1c Recent Labs (last 2 labs)  No results for input(s): HGBA1C in the last 72 hours.   Lipid Profile Recent Labs (last 2 labs)  No results for input(s): CHOL, HDL, LDLCALC, TRIG, CHOLHDL, LDLDIRECT in the last 72 hours.   Thyroid  function studies  Recent Labs (last 2 labs)  No results for input(s): TSH, T4TOTAL, T3FREE, THYROIDAB in the last 72 hours.   Invalid input(s): FREET3   Anemia work up [employer] (last 2 labs)  No results for input(s): VITAMINB12, FOLATE, FERRITIN, TIBC, IRON, RETICCTPCT in the last 72 hours.   Urinalysis Labs (Brief)          Component Value Date/Time    COLORURINE YELLOW 12/06/2020 1107    APPEARANCEUR Clear 07/20/2022 1521    LABSPEC >1.030 (H) 12/06/2020 1107    LABSPEC 1.024 05/27/2014 0827     PHURINE 6.0 12/06/2020 1107    GLUCOSEU Trace (A) 07/20/2022 1521    GLUCOSEU Negative 05/27/2014 0827    HGBUR NEGATIVE 12/06/2020 1107    BILIRUBINUR Negative 07/20/2022 1521    BILIRUBINUR Negative 05/27/2014 0827    KETONESUR TRACE (A) 12/06/2020 1107    PROTEINUR 3+ (A) 07/20/2022 1521    PROTEINUR 30 (A) 12/06/2020 1107    NITRITE Negative 07/20/2022 1521    NITRITE POSITIVE (A) 12/06/2020 1107    LEUKOCYTESUR Negative 07/20/2022 1521    LEUKOCYTESUR SMALL (A) 12/06/2020 1107    LEUKOCYTESUR Negative 05/27/2014 0827      Sepsis Labs Last Labs       Recent Labs  Lab 08/16/23 2233 08/20/23 0404 08/23/23 0315  WBC 8.5 6.0 6.5      Microbiology No results found for this or any previous visit (from the past 240 hours).     Time coordinating discharge: 33 minutes   SIGNED:     Anthony CHRISTELLA Pouch, MD         [facility]

## 2023-08-24 NOTE — Plan of Care (Signed)

## 2024-02-19 ENCOUNTER — Ambulatory Visit
Admission: RE | Admit: 2024-02-19 | Discharge: 2024-02-19 | Disposition: A | Discharge status: Home / Self Care | Source: Ambulatory Visit | Attending: Internal Medicine | Admitting: Internal Medicine

## 2024-02-19 DIAGNOSIS — Z1231 Encounter for screening mammogram for malignant neoplasm of breast: Secondary | ICD-10-CM | POA: Diagnosis present
